# Patient Record
Sex: Male | Born: 1950 | Race: White | Hispanic: No | Marital: Married | Smoking: Never smoker
Health system: Southern US, Community
[De-identification: ages and names within clinical notes are randomized; demographics above are authoritative.]

## PROBLEM LIST (undated history)

## (undated) DIAGNOSIS — R251 Tremor, unspecified: Secondary | ICD-10-CM

## (undated) DIAGNOSIS — D539 Nutritional anemia, unspecified: Secondary | ICD-10-CM

## (undated) DIAGNOSIS — F1010 Alcohol abuse, uncomplicated: Secondary | ICD-10-CM

## (undated) DIAGNOSIS — I509 Heart failure, unspecified: Secondary | ICD-10-CM

## (undated) DIAGNOSIS — K7030 Alcoholic cirrhosis of liver without ascites: Secondary | ICD-10-CM

## (undated) DIAGNOSIS — R4182 Altered mental status, unspecified: Secondary | ICD-10-CM

## (undated) DIAGNOSIS — K7010 Alcoholic hepatitis without ascites: Secondary | ICD-10-CM

## (undated) DIAGNOSIS — D696 Thrombocytopenia, unspecified: Secondary | ICD-10-CM

## (undated) DIAGNOSIS — I10 Essential (primary) hypertension: Secondary | ICD-10-CM

## (undated) DIAGNOSIS — M25551 Pain in right hip: Secondary | ICD-10-CM

## (undated) DIAGNOSIS — M130 Polyarthritis, unspecified: Secondary | ICD-10-CM

## (undated) DIAGNOSIS — K219 Gastro-esophageal reflux disease without esophagitis: Secondary | ICD-10-CM

## (undated) DIAGNOSIS — S02401A Maxillary fracture, unspecified, initial encounter for closed fracture: Secondary | ICD-10-CM

## (undated) DIAGNOSIS — Z8719 Personal history of other diseases of the digestive system: Secondary | ICD-10-CM

## (undated) DIAGNOSIS — K759 Inflammatory liver disease, unspecified: Secondary | ICD-10-CM

## (undated) DIAGNOSIS — R569 Unspecified convulsions: Secondary | ICD-10-CM

## (undated) DIAGNOSIS — R188 Other ascites: Secondary | ICD-10-CM

## (undated) DIAGNOSIS — F1020 Alcohol dependence, uncomplicated: Secondary | ICD-10-CM

## (undated) DIAGNOSIS — G8929 Other chronic pain: Secondary | ICD-10-CM

## (undated) DIAGNOSIS — G312 Degeneration of nervous system due to alcohol: Secondary | ICD-10-CM

## (undated) DIAGNOSIS — E7800 Pure hypercholesterolemia, unspecified: Secondary | ICD-10-CM

## (undated) DIAGNOSIS — M1611 Unilateral primary osteoarthritis, right hip: Secondary | ICD-10-CM

## (undated) HISTORY — DX: Pure hypercholesterolemia, unspecified: E78.00

## (undated) HISTORY — PX: WISDOM TOOTH EXTRACTION: SHX21

## (undated) HISTORY — DX: Inflammatory liver disease, unspecified: K75.9

## (undated) HISTORY — DX: Altered mental status, unspecified: R41.82

## (undated) HISTORY — DX: Degeneration of nervous system due to alcohol: G31.2

## (undated) HISTORY — DX: Unilateral primary osteoarthritis, right hip: M16.11

## (undated) HISTORY — DX: Other chronic pain: G89.29

## (undated) HISTORY — DX: Other ascites: R18.8

## (undated) HISTORY — DX: Thrombocytopenia, unspecified: D69.6

## (undated) HISTORY — DX: Gastro-esophageal reflux disease without esophagitis: K21.9

## (undated) HISTORY — DX: Polyarthritis, unspecified: M13.0

## (undated) HISTORY — DX: Pain in right hip: M25.551

## (undated) HISTORY — DX: Alcohol dependence, uncomplicated: F10.20

## (undated) HISTORY — PX: TONSILLECTOMY: SUR1361

## (undated) HISTORY — DX: Nutritional anemia, unspecified: D53.9

## (undated) HISTORY — DX: Maxillary fracture, unspecified side, initial encounter for closed fracture: S02.401A

## (undated) HISTORY — DX: Alcoholic hepatitis without ascites: K70.10

## (undated) HISTORY — DX: Alcohol abuse, uncomplicated: F10.10

## (undated) HISTORY — DX: Essential (primary) hypertension: I10

## (undated) HISTORY — DX: Other disorders of bilirubin metabolism: E80.6

## (undated) HISTORY — DX: Alcoholic cirrhosis of liver without ascites: K70.30

---

## 2004-03-07 ENCOUNTER — Ambulatory Visit: Payer: Self-pay | Admitting: Internal Medicine

## 2009-01-12 ENCOUNTER — Emergency Department: Payer: Self-pay | Admitting: Emergency Medicine

## 2010-02-06 ENCOUNTER — Emergency Department (HOSPITAL_COMMUNITY)
Admission: EM | Admit: 2010-02-06 | Discharge: 2010-02-06 | Payer: Self-pay | Source: Home / Self Care | Admitting: Emergency Medicine

## 2013-09-23 ENCOUNTER — Emergency Department (HOSPITAL_COMMUNITY): Payer: 59

## 2013-09-23 ENCOUNTER — Emergency Department (HOSPITAL_COMMUNITY)
Admission: EM | Admit: 2013-09-23 | Discharge: 2013-09-23 | Disposition: A | Discharge status: Home / Self Care | Payer: 59 | Attending: Emergency Medicine | Admitting: Emergency Medicine

## 2013-09-23 ENCOUNTER — Encounter (HOSPITAL_COMMUNITY): Payer: Self-pay | Admitting: Emergency Medicine

## 2013-09-23 DIAGNOSIS — R296 Repeated falls: Secondary | ICD-10-CM | POA: Insufficient documentation

## 2013-09-23 DIAGNOSIS — F10929 Alcohol use, unspecified with intoxication, unspecified: Secondary | ICD-10-CM

## 2013-09-23 DIAGNOSIS — S43006A Unspecified dislocation of unspecified shoulder joint, initial encounter: Secondary | ICD-10-CM | POA: Diagnosis not present

## 2013-09-23 DIAGNOSIS — S79919A Unspecified injury of unspecified hip, initial encounter: Secondary | ICD-10-CM | POA: Diagnosis not present

## 2013-09-23 DIAGNOSIS — F1010 Alcohol abuse, uncomplicated: Secondary | ICD-10-CM | POA: Diagnosis not present

## 2013-09-23 DIAGNOSIS — S46909A Unspecified injury of unspecified muscle, fascia and tendon at shoulder and upper arm level, unspecified arm, initial encounter: Secondary | ICD-10-CM | POA: Insufficient documentation

## 2013-09-23 DIAGNOSIS — I10 Essential (primary) hypertension: Secondary | ICD-10-CM | POA: Diagnosis not present

## 2013-09-23 DIAGNOSIS — Z79899 Other long term (current) drug therapy: Secondary | ICD-10-CM | POA: Diagnosis not present

## 2013-09-23 DIAGNOSIS — M25551 Pain in right hip: Secondary | ICD-10-CM

## 2013-09-23 DIAGNOSIS — Y939 Activity, unspecified: Secondary | ICD-10-CM | POA: Diagnosis not present

## 2013-09-23 DIAGNOSIS — S4980XA Other specified injuries of shoulder and upper arm, unspecified arm, initial encounter: Secondary | ICD-10-CM | POA: Diagnosis present

## 2013-09-23 DIAGNOSIS — S79929A Unspecified injury of unspecified thigh, initial encounter: Secondary | ICD-10-CM | POA: Diagnosis not present

## 2013-09-23 DIAGNOSIS — G8929 Other chronic pain: Secondary | ICD-10-CM

## 2013-09-23 DIAGNOSIS — Y929 Unspecified place or not applicable: Secondary | ICD-10-CM | POA: Diagnosis not present

## 2013-09-23 DIAGNOSIS — S43004A Unspecified dislocation of right shoulder joint, initial encounter: Secondary | ICD-10-CM

## 2013-09-23 HISTORY — DX: Essential (primary) hypertension: I10

## 2013-09-23 LAB — COMPREHENSIVE METABOLIC PANEL
ALK PHOS: 85 U/L (ref 39–117)
ALT: 48 U/L (ref 0–53)
ANION GAP: 16 — AB (ref 5–15)
AST: 53 U/L — AB (ref 0–37)
Albumin: 4 g/dL (ref 3.5–5.2)
BILIRUBIN TOTAL: 0.3 mg/dL (ref 0.3–1.2)
BUN: 6 mg/dL (ref 6–23)
CALCIUM: 9.1 mg/dL (ref 8.4–10.5)
CHLORIDE: 109 meq/L (ref 96–112)
CO2: 20 meq/L (ref 19–32)
CREATININE: 0.62 mg/dL (ref 0.50–1.35)
GFR calc Af Amer: >90 mL/min (ref >90)
GFR calc non Af Amer: >90 mL/min (ref >90)
GLUCOSE: 119 mg/dL — AB (ref 70–99)
POTASSIUM: 4.1 meq/L (ref 3.7–5.3)
Sodium: 145 mEq/L (ref 137–147)
TOTAL PROTEIN: 7.3 g/dL (ref 6.0–8.3)

## 2013-09-23 LAB — CBC WITH DIFFERENTIAL/PLATELET
BASOS PCT: 0 % (ref 0–1)
Basophils Absolute: 0 10*3/uL (ref 0.0–0.1)
EOS PCT: 0 % (ref 0–5)
Eosinophils Absolute: 0 10*3/uL (ref 0.0–0.7)
HEMATOCRIT: 49.7 % (ref 39.0–52.0)
HEMOGLOBIN: 16.8 g/dL (ref 13.0–17.0)
LYMPHS PCT: 9 % — AB (ref 12–46)
Lymphs Abs: 0.8 10*3/uL (ref 0.7–4.0)
MCH: 34.9 pg — ABNORMAL HIGH (ref 26.0–34.0)
MCHC: 33.8 g/dL (ref 30.0–36.0)
MCV: 103.3 fL — AB (ref 78.0–100.0)
Monocytes Absolute: 0.3 10*3/uL (ref 0.1–1.0)
Monocytes Relative: 3 % (ref 3–12)
NEUTROS ABS: 8.1 10*3/uL — AB (ref 1.7–7.7)
Neutrophils Relative %: 88 % — ABNORMAL HIGH (ref 43–77)
PLATELETS: 177 10*3/uL (ref 150–400)
RBC: 4.81 MIL/uL (ref 4.22–5.81)
RDW: 13.3 % (ref 11.5–15.5)
WBC: 9.2 10*3/uL (ref 4.0–10.5)

## 2013-09-23 LAB — ETHANOL: ALCOHOL ETHYL (B): 203 mg/dL — AB (ref 0–11)

## 2013-09-23 MED ORDER — ETOMIDATE 2 MG/ML IV SOLN
INTRAVENOUS | Status: AC | PRN
  Administered 2013-09-23: 10 mg via INTRAVENOUS

## 2013-09-23 MED ORDER — PROPOFOL 10 MG/ML IV BOLUS
200.0000 mg | Freq: Once | INTRAVENOUS | Status: DC
  Filled 2013-09-23: qty 1

## 2013-09-23 MED ORDER — PROPOFOL 10 MG/ML IV BOLUS
INTRAVENOUS | Status: AC | PRN
  Administered 2013-09-23 (×2): 100 mg via INTRAVENOUS

## 2013-09-23 MED ORDER — KETAMINE HCL 10 MG/ML IJ SOLN: 200.0000 mg | Freq: Once | INTRAMUSCULAR | Status: DC

## 2013-09-23 MED ORDER — TRAMADOL HCL 50 MG PO TABS: 50.0000 mg | ORAL_TABLET | Freq: Two times a day (BID) | ORAL | Status: DC | PRN

## 2013-09-23 MED ORDER — ETOMIDATE 2 MG/ML IV SOLN
10.0000 mg | Freq: Once | INTRAVENOUS | Status: DC
  Filled 2013-09-23: qty 10

## 2013-09-23 MED ORDER — MORPHINE SULFATE 4 MG/ML IJ SOLN
4.0000 mg | Freq: Once | INTRAMUSCULAR | Status: AC
  Administered 2013-09-23: 4 mg via INTRAVENOUS
  Filled 2013-09-23: qty 1

## 2013-09-23 MED ORDER — HYDROMORPHONE HCL PF 1 MG/ML IJ SOLN
1.0000 mg | Freq: Once | INTRAMUSCULAR | Status: DC
Start: 2013-09-23 — End: 2013-09-23
  Filled 2013-09-23: qty 1

## 2013-09-23 MED ORDER — MORPHINE SULFATE 2 MG/ML IJ SOLN
2.0000 mg | Freq: Once | INTRAMUSCULAR | Status: AC
  Administered 2013-09-23: 2 mg via INTRAVENOUS
  Filled 2013-09-23: qty 1

## 2013-09-23 NOTE — ED Notes (Signed)
First attempt at shoulder reduction unsuccessful. Plan: allow etomidate to wear off, attempt reduction again with propofol when appropriate.

## 2013-09-23 NOTE — Progress Notes (Signed)
[facility] spoke with patient about community resources. Patient stated that he had [employer] but did not have card with him.

## 2013-09-23 NOTE — ED Notes (Signed)
Bed: GX21 Expected date:  Expected time:  Means of arrival:  Comments: Shoulder dislocation

## 2013-09-23 NOTE — ED Notes (Signed)
Per EMS pt fell in his bedroom after consuming some unknown amt of alcohol; deformity to right shoulder noted

## 2013-09-23 NOTE — Discharge Instructions (Signed)
Shoulder Dislocation Your shoulder is made up of three bones: the collar bone (clavicle); the shoulder blade (scapula), which includes the socket (glenoid cavity); and the upper arm bone (humerus). Your shoulder joint is the place where these bones meet. Strong, fibrous tissues hold these bones together (ligaments). Muscles and strong, fibrous tissues that connect the muscles to these bones (tendons) allow your arm to move through this joint. The range of motion of your shoulder joint is more extensive than most of your other joints, and the glenoid cavity is very shallow. That is the reason that your shoulder joint is one of the most unstable joints in your body. It is far more prone to dislocation than your other joints. Shoulder dislocation is when your humerus is forced out of your shoulder joint. CAUSES Shoulder dislocation is caused by a forceful impact on your shoulder. This impact usually is from an injury, such as a sports injury or a fall. SYMPTOMS Symptoms of shoulder dislocation include:  Deformity of your shoulder.  Intense pain.  Inability to move your shoulder joint.  Numbness, weakness, or tingling around your shoulder joint (your neck or down your arm).  Bruising or swelling around your shoulder. DIAGNOSIS In order to diagnose a dislocated shoulder, your caregiver will perform a physical exam. Your caregiver also may have an X-ray exam done to see if you have any broken bones. Magnetic resonance imaging (MRI) is a procedure that sometimes is done to help your caregiver see any damage to the soft tissues around your shoulder, particularly your rotator cuff tendons. Additionally, your caregiver also may have electromyography done to measure the electrical discharges produced in your muscles if you have signs or symptoms of nerve damage. TREATMENT A shoulder dislocation is treated by placing the humerus back in the joint (reduction). Your caregiver does this either manually (closed  reduction), by moving your humerus back into the joint through manipulation, or through surgery (open reduction). When your humerus is back in place, severe pain should improve almost immediately. You also may need to have surgery if you have a weak shoulder joint or ligaments, and you have recurring shoulder dislocations, despite rehabilitation. In rare cases, surgery is necessary if your nerves or blood vessels are damaged during the dislocation. After your reduction, your arm will be placed in a shoulder immobilizer or sling to keep it from moving. Your caregiver will have you wear your shoulder immobilizer or sling for 3 days to 3 weeks, depending on how serious your dislocation is. When your shoulder immobilizer or sling is removed, your caregiver may prescribe physical therapy to help improve the range of motion in your shoulder joint. HOME CARE INSTRUCTIONS  The following measures can help to reduce pain and speed up the healing process:  Rest your injured joint. Do not move it. Avoid activities similar to the one that caused your injury.  Apply ice to your injured joint for the first day or two after your reduction or as directed by your caregiver. Applying ice helps to reduce inflammation and pain.  Put ice in a plastic bag.  Place a towel between your skin and the bag.  Leave the ice on for 15-20 minutes at a time, every 2 hours while you are awake.  Exercise your hand by squeezing a soft ball. This helps to eliminate stiffness and swelling in your hand and wrist.  Take over-the-counter or prescription medicine for pain or discomfort as told by your caregiver. SEEK IMMEDIATE MEDICAL CARE IF:   Your  shoulder immobilizer or sling becomes damaged.  Your pain becomes worse rather than better.  You lose feeling in your arm or hand, or they become white and cold. MAKE SURE YOU:   Understand these instructions.  Will watch your condition.  Will get help right away if you are not  doing well or get worse. Document Released: 11/12/2000 Document Revised: 07/04/2013 Document Reviewed: 12/08/2010 [facility] Patient Information 2015 [place], [place]. This information is not intended to replace advice given to you by your health care provider. Make sure you discuss any questions you have with your health care provider.   Alcohol Intoxication Alcohol intoxication occurs when the amount of alcohol that a person has consumed impairs his or her ability to mentally and physically function. Alcohol directly impairs the normal chemical activity of the brain. Drinking large amounts of alcohol can lead to changes in mental function and behavior, and it can cause many physical effects that can be harmful.  Alcohol intoxication can range in severity from mild to very severe. Various factors can affect the level of intoxication that occurs, such as the person's age, gender, weight, frequency of alcohol consumption, and the presence of other medical conditions (such as diabetes, seizures, or heart conditions). Dangerous levels of alcohol intoxication may occur when people drink large amounts of alcohol in a short period (binge drinking). Alcohol can also be especially dangerous when combined with certain prescription medicines or "recreational" drugs. SIGNS AND SYMPTOMS Some common signs and symptoms of mild alcohol intoxication include:  Loss of coordination.  Changes in mood and behavior.  Impaired judgment.  Slurred speech. As alcohol intoxication progresses to more severe levels, other signs and symptoms will appear. These may include:  Vomiting.  Confusion and impaired memory.  Slowed breathing.  Seizures.  Loss of consciousness. DIAGNOSIS  Your health care provider will take a medical history and perform a physical exam. You will be asked about the amount and type of alcohol you have consumed. Blood tests will be done to measure the concentration of alcohol in your blood. In  many places, your blood alcohol level must be lower than 80 mg/dL (6.33%) to legally drive. However, many dangerous effects of alcohol can occur at much lower levels.  TREATMENT  People with alcohol intoxication often do not require treatment. Most of the effects of alcohol intoxication are temporary, and they go away as the alcohol naturally leaves the body. Your health care provider will monitor your condition until you are stable enough to go home. Fluids are sometimes given through an IV access tube to help prevent dehydration.  HOME CARE INSTRUCTIONS  Do not drive after drinking alcohol.  Stay hydrated. Drink enough water and fluids to keep your urine clear or pale yellow. Avoid caffeine.   Only take over-the-counter or prescription medicines as directed by your health care provider.  SEEK MEDICAL CARE IF:   You have persistent vomiting.   You do not feel better after a few days.  You have frequent alcohol intoxication. Your health care provider can help determine if you should see a substance use treatment counselor. SEEK IMMEDIATE MEDICAL CARE IF:   You become shaky or tremble when you try to stop drinking.   You shake uncontrollably (seizure).   You throw up (vomit) blood. This may be bright red or may look like black coffee grounds.   You have blood in your stool. This may be bright red or may appear as a black, tarry, bad smelling stool.   You  become lightheaded or faint.  MAKE SURE YOU:   Understand these instructions.  Will watch your condition.  Will get help right away if you are not doing well or get worse. Document Released: 11/27/2004 Document Revised: 10/20/2012 Document Reviewed: 07/23/2012 [facility]ExitCare Patient Information 2015 [place]. This information is not intended to replace advice given to you by your health care provider. Make sure you discuss any questions you have with your health care provider.

## 2013-09-23 NOTE — ED Provider Notes (Signed)
CSN: [id number]     Arrival date & time 09/23/13  1151 History   First MD Initiated Contact with Patient 09/23/13 1205     Chief Complaint  Patient presents with  . Shoulder Pain     (Consider location/radiation/quality/duration/timing/severity/associated sxs/prior Treatment) HPI Comments: Per EMS pt fell in his bedroom after consuming some unknown amt of alcohol last night.   Patient is a 63 y.o. male presenting with shoulder injury. The history is provided by the patient and the EMS personnel. No language interpreter was used.  Shoulder Injury This is a new problem. Episode onset: unsure. The problem occurs constantly. The problem has not changed since onset.Pertinent negatives include no chest pain, no abdominal pain, no headaches and no shortness of breath. Associated symptoms comments: R hip pain . Exacerbated by: moving. Relieved by: immobilization. Treatments tried: immobilization. The treatment provided moderate relief.    Past Medical History  Diagnosis Date  . Hypertension    History reviewed. No pertinent past surgical history. Family History  Problem Relation Age of Onset  . Heart failure Father    History  Substance Use Topics  . Smoking status: Never Smoker   . Smokeless tobacco: Never Used  . Alcohol Use: 4.8 oz/week    7 Cans of beer, 1 Shots of liquor per week    Review of Systems  Constitutional: Negative for fever, activity change, appetite change and fatigue.  HENT: Negative for congestion, facial swelling, rhinorrhea and trouble swallowing.   Eyes: Negative for photophobia and pain.  Respiratory: Negative for cough, chest tightness and shortness of breath.   Cardiovascular: Negative for chest pain and leg swelling.  Gastrointestinal: Negative for nausea, vomiting, abdominal pain, diarrhea and constipation.  Endocrine: Negative for polydipsia and polyuria.  Genitourinary: Negative for dysuria, urgency, decreased urine volume and difficulty urinating.   Musculoskeletal: Negative for back pain and gait problem.  Skin: Negative for color change, rash and wound.  Allergic/Immunologic: Negative for immunocompromised state.  Neurological: Negative for dizziness, facial asymmetry, speech difficulty, weakness, numbness and headaches.  Psychiatric/Behavioral: Negative for confusion, decreased concentration and agitation.      Allergies  Review of patient's allergies indicates no known allergies.  Home Medications   Prior to Admission medications   Medication Sig Start Date End Date Taking? Authorizing Provider  Multiple Vitamin (MULTIVITAMIN WITH MINERALS) TABS tablet Take 1 tablet by mouth daily.   Yes Historical Provider, MD  Omega-3 Fatty Acids (FISH OIL PO) Take 1 capsule by mouth daily.   Yes Historical Provider, MD  VITAMIN D, ERGOCALCIFEROL, PO Take 1 capsule by mouth daily.   Yes Historical Provider, MD  traMADol (ULTRAM) 50 MG tablet Take 1 tablet (50 mg total) by mouth every 12 (twelve) hours as needed. 09/23/13   Shanna CiscoMegan E Montee Tallman, MD   BP 170/90  Pulse 105  Temp(Src) 98.6 F (37 C) (Oral)  Resp 18  Ht 5\' 11"  (1.803 m)  Wt 210 lb (95.255 kg)  BMI 29.30 kg/m2  SpO2 97% Physical Exam  Constitutional: He is oriented to person, place, and time. He appears well-developed and well-nourished. No distress.  HENT:  Head: Normocephalic and atraumatic.  Mouth/Throat: No oropharyngeal exudate.  Eyes: Pupils are equal, round, and reactive to light.  Neck: Normal range of motion. Neck supple.  Cardiovascular: Normal rate, regular rhythm and normal heart sounds.  Exam reveals no gallop and no friction rub.   No murmur heard. Pulmonary/Chest: Effort normal and breath sounds normal. No respiratory distress. He has no wheezes.  He has no rales.  Abdominal: Soft. Bowel sounds are normal. He exhibits no distension and no mass. There is no tenderness. There is no rebound and no guarding.  Musculoskeletal: Normal range of motion. He exhibits no  edema and no tenderness.  Neurological: He is alert and oriented to person, place, and time.  Skin: Skin is warm and dry.  Psychiatric: He has a normal mood and affect.    ED Course  Procedural sedation Date/Time: 09/23/2013 3:54 PM Performed by: Toy Cookey, E Authorized by: Toy Cookey, E Consent: Verbal consent obtained. written consent obtained. Risks and benefits: risks, benefits and alternatives were discussed Consent given by: patient Patient understanding: patient states understanding of the procedure being performed Patient consent: the patient's understanding of the procedure matches consent given Procedure consent: procedure consent matches procedure scheduled Relevant documents: relevant documents present and verified Test results: test results available and properly labeled Site marked: the operative site was marked Imaging studies: imaging studies available Required items: required blood products, implants, devices, and special equipment available Patient identity confirmed: verbally with patient Time out: Immediately prior to procedure a "time out" was called to verify the correct patient, procedure, equipment, support staff and site/side marked as required. Patient sedated: yes Sedation type: moderate (conscious) sedation Sedatives: propofol Sedation start date/time: 09/23/2013 3:54 PM Sedation end date/time: 09/23/2013 4:15 PM Vitals: Vital signs were monitored during sedation. Patient tolerance: Patient tolerated the procedure well with no immediate complications. Comments: R shoulder reduction successful  Procedural sedation Date/Time: 09/23/2013 3:26 PM Performed by: Toy Cookey, E Authorized by: Toy Cookey, E Consent: Verbal consent obtained. written consent obtained. Risks and benefits: risks, benefits and alternatives were discussed Consent given by: patient Patient understanding: patient states understanding of the procedure being  performed Patient consent: the patient's understanding of the procedure matches consent given Procedure consent: procedure consent matches procedure scheduled Relevant documents: relevant documents present and verified Test results: test results available and properly labeled Site marked: the operative site was marked Imaging studies: imaging studies available Required items: required blood products, implants, devices, and special equipment available Patient identity confirmed: verbally with patient and arm band Time out: Immediately prior to procedure a "time out" was called to verify the correct patient, procedure, equipment, support staff and site/side marked as required. Patient sedated: yes Sedation type: moderate (conscious) sedation Sedatives: etomidate Sedation start date/time: 09/23/2013 3:26 PM Sedation end date/time: 09/23/2013 3:40 PM Vitals: Vital signs were monitored during sedation. Patient tolerance: Patient tolerated the procedure well with no immediate complications. Comments: R shoulder unable to be reduced  Reduction of dislocation Date/Time: 09/23/2013 3:54 PM Performed by: Toy Cookey, E Authorized by: Toy Cookey, E Consent: Verbal consent obtained. written consent obtained. Risks and benefits: risks, benefits and alternatives were discussed Consent given by: patient Patient understanding: patient states understanding of the procedure being performed Patient consent: the patient's understanding of the procedure matches consent given Procedure consent: procedure consent matches procedure scheduled Relevant documents: relevant documents present and verified Test results: test results available and properly labeled Site marked: the operative site was marked Imaging studies: imaging studies available Required items: required blood products, implants, devices, and special equipment available Patient identity confirmed: verbally with patient and arm band Time  out: Immediately prior to procedure a "time out" was called to verify the correct patient, procedure, equipment, support staff and site/side marked as required. Patient sedated: yes (see sedation documentation) Patient tolerance: Patient tolerated the procedure well with no immediate complications. Comments: Shoulder not reduced  with first attempt, but was successfully reduced with second attempt     (including critical care time) Labs Review Labs Reviewed  ETHANOL - Abnormal; Notable for the following:    Alcohol, Ethyl (B) 203 (*)    All other components within normal limits  CBC WITH DIFFERENTIAL - Abnormal; Notable for the following:    MCV 103.3 (*)    MCH 34.9 (*)    Neutrophils Relative % 88 (*)    Neutro Abs 8.1 (*)    Lymphocytes Relative 9 (*)    All other components within normal limits  COMPREHENSIVE METABOLIC PANEL - Abnormal; Notable for the following:    Glucose, Bld 119 (*)    AST 53 (*)    Anion gap 16 (*)    All other components within normal limits    Imaging Review Dg Chest 2 View  09/23/2013   CLINICAL DATA:  Right shoulder pain post fall  EXAM: CHEST  2 VIEW  COMPARISON:  None.  FINDINGS: Cardiomediastinal silhouette is unremarkable. Old right rib fractures. No acute infiltrate or pleural effusion. No pulmonary edema. There is anterior right shoulder dislocation.  IMPRESSION: No active cardiopulmonary disease. Old right rib fracture. Anterior right shoulder dislocation.   Electronically Signed   By: Natasha Mead M.D.   On: 09/23/2013 13:56   Dg Shoulder Right  09/23/2013   CLINICAL DATA:  Right shoulder pain, fall  EXAM: RIGHT SHOULDER - 2+ VIEW  COMPARISON:  None.  FINDINGS: The humeral head is inferior and medial to the glenoid fossa consistent exercise scar dislocation. No evidence of fracture.  IMPRESSION: Anterior shoulder dislocation.  No evidence of fracture   Electronically Signed   By: Genevive Bi M.D.   On: 09/23/2013 14:56   Dg Hip Complete  Right  09/23/2013   CLINICAL DATA:  Patient found down.  Right hip pain.  EXAM: RIGHT HIP - COMPLETE 2+ VIEW  COMPARISON:  None.  FINDINGS: There is no acute bony or joint abnormality. The patient has bilateral hip degenerative change, much worse on the right where there is bone-on-bone joint space narrowing. No focal bony lesion is identified. Surrounding soft tissue structures appear normal.  IMPRESSION: No acute finding.  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   Electronically Signed   By: Drusilla Kanner M.D.   On: 09/23/2013 14:00   Dg Shoulder Right Port  09/23/2013   CLINICAL DATA:  Status post shoulder reduction.  EXAM: PORTABLE RIGHT SHOULDER - 2+ VIEW  COMPARISON:  09/23/2013.  FINDINGS: Acromioclavicular and glenohumeral degenerative change. No evidence of fracture dislocation. Previously identified anterior shoulder dislocation reduced. Multiple right rib fractures.  IMPRESSION: Interim reduction of right shoulder dislocation.   Electronically Signed   By: Maisie Fus  Register   On: 09/23/2013 16:34     EKG Interpretation None      MDM   Final diagnoses:  Shoulder dislocation, right, initial encounter  Alcohol intoxication, with unspecified complication  Chronic right hip pain    Pt is a 63 y.o. male with Pmhx as above who presents with R shoulder and R hip pain. Pt states he had slight shoulder pain, last night, was worse this morning. R hip pain he says is chronic, but worse today. He reports having one beer 2 night ago. Wife reports he has been drinking beer all night long. EMS reported he had fallen in the bedroom.  Suspect R shoulder dislocation vs humerus fx on PE. Pt also mildly hypoxic initally, but denies CP, SOB,  cough. Will get CXR and XR R hip as well.   CXR nml except for R anterior shoulder dislocation. XR hip with OA  6:55 PM Have attempted manipulation without sedation unsuccessfully. Will do procedural sedation for closed reduction.   Second reduction w/  propofol appears to have been successful.  Care transferred to Dr. Gwendolyn Grant who will f/u with post-reduction films. Plan for d/c home w/ shoulder immobilizer and outpt ortho f/u in 1 week.       Shanna Cisco, MD 09/23/13 [phone]

## 2013-09-23 NOTE — ED Provider Notes (Signed)
1630 - patient care from Dr. Micheline Maze.  Patient is alert and oriented, doing well post-sedation. Ambulatory. Stable for discharge.  1. Shoulder dislocation, right, initial encounter   2. Alcohol intoxication, with unspecified complication   3. Chronic right hip pain      Gary Hait, MD 09/23/13 1659

## 2014-02-25 ENCOUNTER — Encounter (HOSPITAL_COMMUNITY): Payer: Self-pay | Admitting: Emergency Medicine

## 2014-02-25 ENCOUNTER — Inpatient Hospital Stay (HOSPITAL_COMMUNITY): Payer: No Typology Code available for payment source

## 2014-02-25 ENCOUNTER — Inpatient Hospital Stay (HOSPITAL_COMMUNITY)
Admission: EM | Admit: 2014-02-25 | Discharge: 2014-03-04 | DRG: 432 | Disposition: A | Discharge status: Skilled Nursing Facility | Payer: No Typology Code available for payment source | Attending: Internal Medicine | Admitting: Internal Medicine

## 2014-02-25 DIAGNOSIS — D6959 Other secondary thrombocytopenia: Secondary | ICD-10-CM | POA: Diagnosis present

## 2014-02-25 DIAGNOSIS — F1020 Alcohol dependence, uncomplicated: Secondary | ICD-10-CM | POA: Diagnosis present

## 2014-02-25 DIAGNOSIS — Z8249 Family history of ischemic heart disease and other diseases of the circulatory system: Secondary | ICD-10-CM

## 2014-02-25 DIAGNOSIS — G252 Other specified forms of tremor: Secondary | ICD-10-CM | POA: Diagnosis present

## 2014-02-25 DIAGNOSIS — K759 Inflammatory liver disease, unspecified: Secondary | ICD-10-CM

## 2014-02-25 DIAGNOSIS — W19XXXA Unspecified fall, initial encounter: Secondary | ICD-10-CM | POA: Diagnosis present

## 2014-02-25 DIAGNOSIS — D696 Thrombocytopenia, unspecified: Secondary | ICD-10-CM | POA: Diagnosis not present

## 2014-02-25 DIAGNOSIS — D649 Anemia, unspecified: Secondary | ICD-10-CM | POA: Diagnosis not present

## 2014-02-25 DIAGNOSIS — K7290 Hepatic failure, unspecified without coma: Secondary | ICD-10-CM | POA: Diagnosis present

## 2014-02-25 DIAGNOSIS — I10 Essential (primary) hypertension: Secondary | ICD-10-CM | POA: Diagnosis present

## 2014-02-25 DIAGNOSIS — K7200 Acute and subacute hepatic failure without coma: Secondary | ICD-10-CM | POA: Diagnosis not present

## 2014-02-25 DIAGNOSIS — B179 Acute viral hepatitis, unspecified: Secondary | ICD-10-CM | POA: Diagnosis present

## 2014-02-25 DIAGNOSIS — G9340 Encephalopathy, unspecified: Secondary | ICD-10-CM | POA: Diagnosis present

## 2014-02-25 DIAGNOSIS — E876 Hypokalemia: Secondary | ICD-10-CM | POA: Diagnosis present

## 2014-02-25 DIAGNOSIS — F1010 Alcohol abuse, uncomplicated: Secondary | ICD-10-CM | POA: Diagnosis not present

## 2014-02-25 DIAGNOSIS — F10239 Alcohol dependence with withdrawal, unspecified: Secondary | ICD-10-CM | POA: Diagnosis present

## 2014-02-25 DIAGNOSIS — R509 Fever, unspecified: Secondary | ICD-10-CM

## 2014-02-25 DIAGNOSIS — E871 Hypo-osmolality and hyponatremia: Secondary | ICD-10-CM | POA: Insufficient documentation

## 2014-02-25 DIAGNOSIS — E8770 Fluid overload, unspecified: Secondary | ICD-10-CM | POA: Diagnosis present

## 2014-02-25 DIAGNOSIS — R17 Unspecified jaundice: Secondary | ICD-10-CM

## 2014-02-25 DIAGNOSIS — R0600 Dyspnea, unspecified: Secondary | ICD-10-CM

## 2014-02-25 DIAGNOSIS — K7010 Alcoholic hepatitis without ascites: Principal | ICD-10-CM | POA: Diagnosis present

## 2014-02-25 DIAGNOSIS — D539 Nutritional anemia, unspecified: Secondary | ICD-10-CM | POA: Diagnosis present

## 2014-02-25 HISTORY — DX: Alcoholic hepatitis without ascites: K70.10

## 2014-02-25 LAB — COMPREHENSIVE METABOLIC PANEL
ALT: 120 U/L — ABNORMAL HIGH (ref 0–53)
AST: 360 U/L — ABNORMAL HIGH (ref 0–37)
Albumin: 2.6 g/dL — ABNORMAL LOW (ref 3.5–5.2)
Alkaline Phosphatase: 437 U/L — ABNORMAL HIGH (ref 39–117)
Anion gap: 14 (ref 5–15)
BUN: 9 mg/dL (ref 6–23)
CO2: 31 mmol/L (ref 19–32)
Calcium: 8.3 mg/dL — ABNORMAL LOW (ref 8.4–10.5)
Chloride: 86 mEq/L — ABNORMAL LOW (ref 96–112)
Creatinine, Ser: <0.3 mg/dL — ABNORMAL LOW (ref 0.50–1.35)
Glucose, Bld: 95 mg/dL (ref 70–99)
Potassium: 2 mmol/L — CL (ref 3.5–5.1)
Sodium: 131 mmol/L — ABNORMAL LOW (ref 135–145)
Total Bilirubin: 26.3 mg/dL (ref 0.3–1.2)
Total Protein: 7.2 g/dL (ref 6.0–8.3)

## 2014-02-25 LAB — CBC WITH DIFFERENTIAL/PLATELET
Basophils Absolute: 0.1 10*3/uL (ref 0.0–0.1)
Basophils Relative: 1 % (ref 0–1)
Eosinophils Absolute: 0.1 10*3/uL (ref 0.0–0.7)
Eosinophils Relative: 1 % (ref 0–5)
HCT: 42.7 % (ref 39.0–52.0)
Hemoglobin: 14.7 g/dL (ref 13.0–17.0)
Lymphocytes Relative: 10 % — ABNORMAL LOW (ref 12–46)
Lymphs Abs: 1 10*3/uL (ref 0.7–4.0)
MCH: 36.3 pg — ABNORMAL HIGH (ref 26.0–34.0)
MCHC: 34.4 g/dL (ref 30.0–36.0)
MCV: 105.4 fL — ABNORMAL HIGH (ref 78.0–100.0)
Monocytes Absolute: 0.5 10*3/uL (ref 0.1–1.0)
Monocytes Relative: 5 % (ref 3–12)
Neutro Abs: 7.7 10*3/uL (ref 1.7–7.7)
Neutrophils Relative %: 82 % — ABNORMAL HIGH (ref 43–77)
Platelets: 110 10*3/uL — ABNORMAL LOW (ref 150–400)
RBC: 4.05 MIL/uL — ABNORMAL LOW (ref 4.22–5.81)
RDW: 15.5 % (ref 11.5–15.5)
WBC: 9.3 10*3/uL (ref 4.0–10.5)

## 2014-02-25 LAB — PROTIME-INR
INR: 1.28 (ref 0.00–1.49)
INR: 1.27 (ref 0.00–1.49)
Prothrombin Time: 16.1 seconds — ABNORMAL HIGH (ref 11.6–15.2)
Prothrombin Time: 16 seconds — ABNORMAL HIGH (ref 11.6–15.2)

## 2014-02-25 LAB — CBC
HEMATOCRIT: 37.6 % — AB (ref 39.0–52.0)
HEMOGLOBIN: 12.8 g/dL — AB (ref 13.0–17.0)
MCH: 35.8 pg — ABNORMAL HIGH (ref 26.0–34.0)
MCHC: 34 g/dL (ref 30.0–36.0)
MCV: 105 fL — ABNORMAL HIGH (ref 78.0–100.0)
Platelets: 119 10*3/uL — ABNORMAL LOW (ref 150–400)
RBC: 3.58 MIL/uL — AB (ref 4.22–5.81)
RDW: 15.9 % — ABNORMAL HIGH (ref 11.5–15.5)
WBC: 10.1 10*3/uL (ref 4.0–10.5)

## 2014-02-25 LAB — AMMONIA: Ammonia: 98 umol/L — ABNORMAL HIGH (ref 11–32)

## 2014-02-25 LAB — ETHANOL: Alcohol, Ethyl (B): 392 mg/dL — ABNORMAL HIGH (ref 0–9)

## 2014-02-25 MED ORDER — PANTOPRAZOLE SODIUM 40 MG IV SOLR
40.0000 mg | INTRAVENOUS | Status: DC
  Administered 2014-02-26 – 2014-02-28 (×3): 40 mg via INTRAVENOUS
  Filled 2014-02-25 (×4): qty 40

## 2014-02-25 MED ORDER — POTASSIUM CHLORIDE 10 MEQ/100ML IV SOLN
10.0000 meq | INTRAVENOUS | Status: AC
  Administered 2014-02-25 – 2014-02-26 (×5): 10 meq via INTRAVENOUS
  Filled 2014-02-25 (×6): qty 100

## 2014-02-25 MED ORDER — ADULT MULTIVITAMIN W/MINERALS CH
1.0000 | ORAL_TABLET | Freq: Every day | ORAL | Status: DC
  Administered 2014-02-25 – 2014-03-02 (×6): 1 via ORAL
  Filled 2014-02-25 (×6): qty 1

## 2014-02-25 MED ORDER — SODIUM CHLORIDE 0.9 % IJ SOLN
3.0000 mL | Freq: Two times a day (BID) | INTRAMUSCULAR | Status: DC
  Administered 2014-02-25 – 2014-03-04 (×7): 3 mL via INTRAVENOUS

## 2014-02-25 MED ORDER — DIAZEPAM 5 MG/ML IJ SOLN
10.0000 mg | Freq: Once | INTRAMUSCULAR | Status: AC
  Administered 2014-02-25: 10 mg via INTRAVENOUS
  Filled 2014-02-25: qty 2

## 2014-02-25 MED ORDER — POTASSIUM CHLORIDE 10 MEQ/100ML IV SOLN
10.0000 meq | INTRAVENOUS | Status: AC
  Administered 2014-02-25 (×3): 10 meq via INTRAVENOUS
  Filled 2014-02-25 (×3): qty 100

## 2014-02-25 MED ORDER — LORAZEPAM 2 MG/ML IJ SOLN
1.0000 mg | Freq: Four times a day (QID) | INTRAMUSCULAR | Status: AC | PRN
  Administered 2014-02-26: 1 mg via INTRAVENOUS
  Filled 2014-02-25 (×2): qty 1

## 2014-02-25 MED ORDER — ONDANSETRON HCL 4 MG PO TABS: 4.0000 mg | ORAL_TABLET | Freq: Four times a day (QID) | ORAL | Status: DC | PRN

## 2014-02-25 MED ORDER — LACTULOSE 10 GM/15ML PO SOLN
20.0000 g | Freq: Once | ORAL | Status: AC
  Administered 2014-02-25: 20 g via ORAL
  Filled 2014-02-25: qty 30

## 2014-02-25 MED ORDER — FOLIC ACID 5 MG/ML IJ SOLN
1.0000 mg | Freq: Every day | INTRAMUSCULAR | Status: DC
  Administered 2014-02-25 – 2014-03-01 (×5): 1 mg via INTRAVENOUS
  Filled 2014-02-25 (×6): qty 0.2

## 2014-02-25 MED ORDER — LORAZEPAM 1 MG PO TABS: 1.0000 mg | ORAL_TABLET | Freq: Four times a day (QID) | ORAL | Status: AC | PRN

## 2014-02-25 MED ORDER — MORPHINE SULFATE 2 MG/ML IJ SOLN
1.0000 mg | INTRAMUSCULAR | Status: DC | PRN
  Administered 2014-02-26 (×3): 1 mg via INTRAVENOUS
  Filled 2014-02-25 (×3): qty 1

## 2014-02-25 MED ORDER — POTASSIUM CHLORIDE CRYS ER 20 MEQ PO TBCR
60.0000 meq | EXTENDED_RELEASE_TABLET | Freq: Once | ORAL | Status: AC
  Administered 2014-02-25: 60 meq via ORAL
  Filled 2014-02-25: qty 3

## 2014-02-25 MED ORDER — LACTULOSE 10 GM/15ML PO SOLN
20.0000 g | Freq: Two times a day (BID) | ORAL | Status: DC
  Administered 2014-02-25 – 2014-02-27 (×4): 20 g via ORAL
  Filled 2014-02-25 (×5): qty 30

## 2014-02-25 MED ORDER — SODIUM CHLORIDE 0.9 % IV SOLN
INTRAVENOUS | Status: DC
  Administered 2014-02-25 – 2014-03-02 (×8): via INTRAVENOUS

## 2014-02-25 MED ORDER — ONDANSETRON HCL 4 MG/2ML IJ SOLN: 4.0000 mg | Freq: Four times a day (QID) | INTRAMUSCULAR | Status: DC | PRN

## 2014-02-25 MED ORDER — THIAMINE HCL 100 MG/ML IJ SOLN
100.0000 mg | Freq: Every day | INTRAMUSCULAR | Status: DC
  Administered 2014-02-25 – 2014-03-01 (×5): 100 mg via INTRAVENOUS
  Filled 2014-02-25 (×5): qty 1

## 2014-02-25 NOTE — H&P (Addendum)
[facility] History and Physical  Gary [name] XBD:[id number] DOB: [birth year] DOA: 02/25/2014  Referring physician: ED physician PCP: No primary care provider on file.   Chief Complaint: altered mental status with yellow skin   HPI:  Pt is 63 yo male with history of alcohol abuse, presented to [facility] ED via EMS after neighbor noted pt appeared intoxicated and with yellow skin. Please note that pt exhibits confusion and is not reliable historian. Some information obtained from friend at bedside who explains pt drinks 10 -12 bottles of beer daily but not sure if he drinks anything else. Friend is also not sure if pt takes any other substances. He thinks pt may have vomited several times as well and he explains that the house is unkempt, with bed smell from alcohol and vomiting. In ED, pt appears intoxicated. VS notable for oxygen sat 87% on RA. Blood work notable for K 2, Na 131, severe transaminitis with bili > 25, PE significant for jaundice as well. [facility] asked to admit for management of acute alcoholic hepatitis. Abd [place] requested in ED and pending.   Assessment and Plan: Active Problems: Acute alcoholic hepatitis - admit to tele bed - repeat CMET now and again in AM, check lipase level  - follow up on abd [place] - keep on CIWA protocol  - MELD score 22 (10% 3 month mortality) Acute encephalopathy - secondary to acute alcohol intoxication, hepatic encephalopathy  - ammonia level elevated, repeat in AM - place on lactulose  - CIWA as noted above - check UDS  Hypokalemia - from electrolyte depletion in the setting of alcohol abuse and ? Vomiting  - supplement, check Mg level and supplement as well if needed  - repeat BMP in AM and also check Mg level in AM Hyponatremia - secondary to pre renal etiology - provide IVF and repeat BMP in AM Abd pain with ? N/V - from alcohol intoxication - abd [place] pending, check lipase  - place on Protonix BID IV Thrombocytopenia - from alcohol induced  bone marrow damage - no signs of active bleeding  - repeat CBC in AM  SCD's for DVT prophylaxis   Radiological Exams on Admission: No results found.   Code Status: Full Family Communication: No family at bedside, friend at bedside  Disposition Plan: Admit for further evaluation     Review of Systems:  Unable to obtain due to altered mental status     Past Medical History  Diagnosis Date  . Hypertension     History reviewed. No pertinent past surgical history.  Social History:  reports that he has never smoked. He has never used smokeless tobacco. He reports that he drinks about 4.8 oz of alcohol per week. He reports that he does not use illicit drugs.  No Known Allergies  Family History  Problem Relation Age of Onset  . Heart failure Father     Prior to Admission medications   Medication Sig Start Date End Date Taking? Authorizing Provider  Multiple Vitamin (MULTIVITAMIN WITH MINERALS) TABS tablet Take 1 tablet by mouth daily.   Yes Historical Provider, MD  Omega-3 Fatty Acids (FISH OIL PO) Take 1 capsule by mouth daily.   Yes Historical Provider, MD  traMADol (ULTRAM) 50 MG tablet Take 1 tablet (50 mg total) by mouth every 12 (twelve) hours as needed. 09/23/13  Yes Toy Cookey, MD  allopurinol (ZYLOPRIM) 100 MG tablet Take 100-200 mg by mouth daily. Take 100mg  for 15 days then increase to 200mg  for  15 days.    Historical Provider, MD  colchicine 0.6 MG tablet Take 0.6 mg by mouth 2 (two) times daily.    Historical Provider, MD  lisinopril (PRINIVIL,ZESTRIL) 20 MG tablet Take 20 mg by mouth daily.    Historical Provider, MD    Physical Exam: Filed Vitals:   02/25/14 1610 02/25/14 1807 02/25/14 1807 02/25/14 1817  BP: 103/66   112/77  Pulse: 82   87  Temp:    98.7 F (37.1 C)  TempSrc:    Oral  Resp: 18   18  SpO2: 97% 87% 94% 97%    Physical Exam  Constitutional: Appears well-developed and well-nourished. No distress.  HENT: Normocephalic. External right  and left ear normal. Dry MM Eyes: Conjunctivae and EOM are normal. PERRLA, scleral icterus noted   Neck: Normal ROM. Neck supple. No JVD. No tracheal deviation. No thyromegaly.  CVS: RRR, S1/S2 +, no murmurs, no gallops, no carotid bruit.  Pulmonary: Effort and breath sounds normal, no stridor, mild rhonchi at bases  Abdominal: Soft. BS +,  mild distension, no tenderness, rebound or guarding.  Musculoskeletal: Normal range of motion.  Lymphadenopathy: No lymphadenopathy noted, cervical, inguinal. Neuro: Alert. No cranial nerve deficit. Skin: Skin is warm and dry. Jaundiced  Psychiatric: Difficult to assess due to AMS  Labs on Admission:  Basic Metabolic Panel:  Recent Labs Lab 02/25/14 1542  NA 131*  K 2.0*  CL 86*  CO2 31  GLUCOSE 95  BUN 9  CREATININE <0.30*  CALCIUM 8.3*   Liver Function Tests:  Recent Labs Lab 02/25/14 1542  AST 360*  ALT 120*  ALKPHOS 437*  BILITOT 26.3*  PROT 7.2  ALBUMIN 2.6*     Recent Labs Lab 02/25/14 1633  AMMONIA 98*   CBC:  Recent Labs Lab 02/25/14 1542  WBC 9.3  NEUTROABS 7.7  HGB 14.7  HCT 42.7  MCV 105.4*  PLT 110*    EKG: Normal sinus rhythm, no ST/T wave changes  Gary PrestoMAGICK-Tanner Yeley, MD  [facility] Pager [phone]  If 7PM-7AM, please contact night-coverage [email or web address] Password [email or web address] 02/25/2014, 6:32 PM

## 2014-02-25 NOTE — ED Notes (Addendum)
Pt O2 dropped to 86% after valium admin. Dr Juleen China made aware that pt is on 3L O2

## 2014-02-25 NOTE — ED Notes (Signed)
US at bedside

## 2014-02-25 NOTE — ED Notes (Addendum)
Pt sts that his neighbors called EMS after they told him he was yellow. Pt A&O x3, but is making statements that do not pertain to conversation and are not logical. Pt sts "I need to get out of January so I can get to December." Pt skin is yellow including eyes and pt has petechiae to trunk. Pt in NAD

## 2014-02-25 NOTE — ED Notes (Signed)
Baird Kay, pt friend and neighbor [phone]

## 2014-02-25 NOTE — ED Notes (Signed)
Bed: GY18 Expected date: 02/25/14 Expected time: 2:46 PM Means of arrival: Ambulance Comments: ETOH

## 2014-02-25 NOTE — ED Notes (Addendum)
Pt friend sts that pt drinks approx 10-12, 24oz beers per day.

## 2014-02-25 NOTE — ED Notes (Addendum)
Hospitalist called and requested to speak to fried at bedside to obtain more information. Pt gave permission for his friend to talk to admitting physician about his medical care and personal info. Friend is speaking to Dr Izola Price  at this time.

## 2014-02-25 NOTE — ED Notes (Signed)
Pt from home via EMS-Pt neighbors called this am for well check, pt vitals were stable and pt refused transport at that time. EMS was called out again after his neighbors noticed that pt "was yellow." Pt denies hx of hepatic disease. Pt reports that his last consumption was 1 hour ago where he ingested alcoholic energy drinks mixed with liquor. Pt is alert to self and situation but sts that he is not oriented to time because he sts that he has not kept up with it in weeks. Pt in NAD

## 2014-02-26 ENCOUNTER — Inpatient Hospital Stay (HOSPITAL_COMMUNITY): Payer: No Typology Code available for payment source

## 2014-02-26 DIAGNOSIS — K7200 Acute and subacute hepatic failure without coma: Secondary | ICD-10-CM

## 2014-02-26 DIAGNOSIS — E876 Hypokalemia: Secondary | ICD-10-CM

## 2014-02-26 DIAGNOSIS — F1010 Alcohol abuse, uncomplicated: Secondary | ICD-10-CM

## 2014-02-26 LAB — COMPREHENSIVE METABOLIC PANEL
ALBUMIN: 2.3 g/dL — AB (ref 3.5–5.2)
ALT: 103 U/L — AB (ref 0–53)
ALT: 95 U/L — ABNORMAL HIGH (ref 0–53)
AST: 313 U/L — AB (ref 0–37)
AST: 299 U/L — ABNORMAL HIGH (ref 0–37)
Albumin: 2.2 g/dL — ABNORMAL LOW (ref 3.5–5.2)
Alkaline Phosphatase: 371 U/L — ABNORMAL HIGH (ref 39–117)
Alkaline Phosphatase: 345 U/L — ABNORMAL HIGH (ref 39–117)
Anion gap: 12 (ref 5–15)
Anion gap: 10 (ref 5–15)
BILIRUBIN TOTAL: 21.6 mg/dL — AB (ref 0.3–1.2)
BUN: 9 mg/dL (ref 6–23)
BUN: 8 mg/dL (ref 6–23)
CALCIUM: 7.5 mg/dL — AB (ref 8.4–10.5)
CHLORIDE: 89 meq/L — AB (ref 96–112)
CO2: 28 mmol/L (ref 19–32)
CO2: 26 mmol/L (ref 19–32)
CREATININE: 0.31 mg/dL — AB (ref 0.50–1.35)
Calcium: 7.3 mg/dL — ABNORMAL LOW (ref 8.4–10.5)
Chloride: 93 mEq/L — ABNORMAL LOW (ref 96–112)
Creatinine, Ser: 0.42 mg/dL — ABNORMAL LOW (ref 0.50–1.35)
GFR calc Af Amer: >90 mL/min (ref >90)
Glucose, Bld: 86 mg/dL (ref 70–99)
Glucose, Bld: 84 mg/dL (ref 70–99)
Potassium: 2.4 mmol/L — CL (ref 3.5–5.1)
Potassium: 2.7 mmol/L — CL (ref 3.5–5.1)
SODIUM: 129 mmol/L — AB (ref 135–145)
Sodium: 129 mmol/L — ABNORMAL LOW (ref 135–145)
Total Bilirubin: 22.1 mg/dL (ref 0.3–1.2)
Total Protein: 6.2 g/dL (ref 6.0–8.3)
Total Protein: 5.8 g/dL — ABNORMAL LOW (ref 6.0–8.3)

## 2014-02-26 LAB — URINALYSIS, ROUTINE W REFLEX MICROSCOPIC
GLUCOSE, UA: NEGATIVE mg/dL
Hgb urine dipstick: NEGATIVE
Ketones, ur: NEGATIVE mg/dL
Nitrite: POSITIVE — AB
PROTEIN: NEGATIVE mg/dL
Specific Gravity, Urine: 1.015 (ref 1.005–1.030)
Urobilinogen, UA: 4 mg/dL — ABNORMAL HIGH (ref 0.0–1.0)
pH: 6 (ref 5.0–8.0)

## 2014-02-26 LAB — RAPID URINE DRUG SCREEN, HOSP PERFORMED
Amphetamines: NOT DETECTED
Barbiturates: NOT DETECTED
Benzodiazepines: NOT DETECTED
COCAINE: NOT DETECTED
Opiates: NOT DETECTED
TETRAHYDROCANNABINOL: NOT DETECTED

## 2014-02-26 LAB — HEPATITIS PANEL, ACUTE
HCV Ab: NEGATIVE
Hep A IgM: NONREACTIVE
Hep B C IgM: NONREACTIVE
Hepatitis B Surface Ag: NEGATIVE

## 2014-02-26 LAB — CBC
HEMATOCRIT: 35.8 % — AB (ref 39.0–52.0)
Hemoglobin: 12.2 g/dL — ABNORMAL LOW (ref 13.0–17.0)
MCH: 35.9 pg — AB (ref 26.0–34.0)
MCHC: 34.1 g/dL (ref 30.0–36.0)
MCV: 105.3 fL — AB (ref 78.0–100.0)
Platelets: 110 10*3/uL — ABNORMAL LOW (ref 150–400)
RBC: 3.4 MIL/uL — ABNORMAL LOW (ref 4.22–5.81)
RDW: 16.1 % — AB (ref 11.5–15.5)
WBC: 9.4 10*3/uL (ref 4.0–10.5)

## 2014-02-26 LAB — URINE MICROSCOPIC-ADD ON

## 2014-02-26 LAB — MAGNESIUM
MAGNESIUM: 1.5 mg/dL (ref 1.5–2.5)
Magnesium: 1.7 mg/dL (ref 1.5–2.5)
Magnesium: 2 mg/dL (ref 1.5–2.5)

## 2014-02-26 LAB — LIPASE, BLOOD
Lipase: 136 U/L — ABNORMAL HIGH (ref 11–59)
Lipase: 128 U/L — ABNORMAL HIGH (ref 11–59)

## 2014-02-26 LAB — AMMONIA: AMMONIA: 73 umol/L — AB (ref 11–32)

## 2014-02-26 LAB — PHOSPHORUS: PHOSPHORUS: 2.5 mg/dL (ref 2.3–4.6)

## 2014-02-26 LAB — ETHANOL: ALCOHOL ETHYL (B): 159 mg/dL — AB (ref 0–9)

## 2014-02-26 LAB — TSH: TSH: 2.625 u[IU]/mL (ref 0.350–4.500)

## 2014-02-26 MED ORDER — VITAMINS A & D EX OINT
TOPICAL_OINTMENT | CUTANEOUS | Status: AC
  Administered 2014-02-26: 5
  Filled 2014-02-26: qty 5

## 2014-02-26 MED ORDER — POTASSIUM CHLORIDE 10 MEQ/100ML IV SOLN
10.0000 meq | Freq: Once | INTRAVENOUS | Status: AC
  Administered 2014-02-26: 10 meq via INTRAVENOUS
  Filled 2014-02-26: qty 100

## 2014-02-26 MED ORDER — PIPERACILLIN-TAZOBACTAM 3.375 G IVPB
3.3750 g | Freq: Three times a day (TID) | INTRAVENOUS | Status: DC
  Administered 2014-02-26 – 2014-03-01 (×9): 3.375 g via INTRAVENOUS
  Filled 2014-02-26 (×11): qty 50

## 2014-02-26 MED ORDER — VANCOMYCIN HCL IN DEXTROSE 1-5 GM/200ML-% IV SOLN
1000.0000 mg | Freq: Three times a day (TID) | INTRAVENOUS | Status: DC
  Administered 2014-02-26 – 2014-02-27 (×2): 1000 mg via INTRAVENOUS
  Filled 2014-02-26 (×3): qty 200

## 2014-02-26 MED ORDER — MAGNESIUM SULFATE 2 GM/50ML IV SOLN
2.0000 g | Freq: Once | INTRAVENOUS | Status: AC
  Administered 2014-02-26: 2 g via INTRAVENOUS
  Filled 2014-02-26: qty 50

## 2014-02-26 MED ORDER — IBUPROFEN 200 MG PO TABS
400.0000 mg | ORAL_TABLET | ORAL | Status: DC | PRN
Start: 2014-02-26 — End: 2014-03-04
  Administered 2014-02-26 – 2014-03-03 (×2): 400 mg via ORAL
  Filled 2014-02-26 (×2): qty 2

## 2014-02-26 NOTE — Progress Notes (Addendum)
Patient had a low grade temperature Dr. Red Christians notified and ordered PRN Ibuprofen given and effective.

## 2014-02-26 NOTE — Progress Notes (Signed)
[facility] PROGRESS NOTE  Gary [name] AST:[id number] DOB: 1950/09/03 DOA: 02/25/2014 PCP: No primary care provider on file.  Assessment/Plan: 1. Acute alcoholic hepatitis 1. LFT's slowly trending down 2. abd [place] without biliary obstruction 3. Cont supportive care 4. Avoid hepatotoxic agents 2. Acute encephalopathy 1. Seems stable this AM 3. ETOH abuse 1. Cont on CIWA protocol 2. Resting tremor noted in room 4. Hypokalemia 1. Correct as needed 5. Hypomagnesemia  1. Cont to follow and replace 2. Likely related to ETOH abuse 6. Hyponatremia 1. Follow 2. Likely from etoh abuse 7. Thrombocytopenia 1. Stable and >100k 2. Monitor 8. DVT prophylaixs 1. SCD's  Code Status: Full Family Communication: Pt in room (indicate person spoken with, relationship, and if by phone, the number) Disposition Plan: Pending   Consultants:    Procedures:    Antibiotics:   (indicate start date, and stop date if known)  HPI/Subjective: No acute events noted overnight  Objective: Filed Vitals:   02/25/14 2030 02/26/14 0300 02/26/14 0530 02/26/14 1200  BP: 127/73 120/78  136/90  Pulse: 89 88  99  Temp: 98.8 F (37.1 C) 99.3 F (37.4 C)    TempSrc: Oral Oral    Resp: 18 18    Height: 5\' 9"  (1.753 m)     Weight: 85.3 kg (188 lb 0.8 oz)  89.5 kg (197 lb 5 oz)   SpO2: 100% 100%      Intake/Output Summary (Last 24 hours) at 02/26/14 1354 Last data filed at 02/26/14 1300  Gross per 24 hour  Intake 838.33 ml  Output    400 ml  Net 438.33 ml   Filed Weights   02/25/14 2030 02/26/14 0530  Weight: 85.3 kg (188 lb 0.8 oz) 89.5 kg (197 lb 5 oz)    Exam:   General:  Awake, in nad  Cardiovascular: regular, s1, s2  Respiratory: normal resp effort, no wheezing  Abdomen: soft,nondistended  Musculoskeletal: perfused, no clubbing   Data Reviewed: Basic Metabolic Panel:  Recent Labs Lab 02/25/14 1542 02/25/14 2308 02/26/14 0002 02/26/14 0520  NA 131* 129*  --   129*  K 2.0* 2.4*  --  2.7*  CL 86* 89*  --  93*  CO2 31 28  --  26  GLUCOSE 95 86  --  84  BUN 9 9  --  8  CREATININE <0.30* 0.42*  --  0.31*  CALCIUM 8.3* 7.5*  --  7.3*  MG  --  1.7 2.0 1.5  PHOS  --  2.5  --   --    Liver Function Tests:  Recent Labs Lab 02/25/14 1542 02/25/14 2308 02/26/14 0520  AST 360* 313* 299*  ALT 120* 103* 95*  ALKPHOS 437* 371* 345*  BILITOT 26.3* 22.1* 21.6*  PROT 7.2 6.2 5.8*  ALBUMIN 2.6* 2.3* 2.2*    Recent Labs Lab 02/25/14 2308 02/26/14 0520  LIPASE 136* 128*    Recent Labs Lab 02/25/14 1633 02/25/14 2308  AMMONIA 98* 73*   CBC:  Recent Labs Lab 02/25/14 1542 02/25/14 2308 02/26/14 0520  WBC 9.3 10.1 9.4  NEUTROABS 7.7  --   --   HGB 14.7 12.8* 12.2*  HCT 42.7 37.6* 35.8*  MCV 105.4* 105.0* 105.3*  PLT 110* 119* 110*   Cardiac Enzymes: No results for input(s): CKTOTAL, CKMB, CKMBINDEX, TROPONINI in the last 168 hours. BNP (last 3 results) No results for input(s): PROBNP in the last 8760 hours. CBG: No results for input(s): GLUCAP in the last 168 hours.  No results found for this or any previous visit (from the past 240 hour(s)).   Studies: X-ray Chest Pa And Lateral  02/25/2014   CLINICAL DATA:  Acute onset of dyspnea for 1 day. Initial encounter.  EXAM: CHEST  2 VIEW  COMPARISON:  Chest radiograph performed 09/23/2013  FINDINGS: The lungs are well-aerated. There is chronic elevation of the right hemidiaphragm. Minimal left basilar atelectasis is noted. There is no evidence of pleural effusion or pneumothorax.  The heart is borderline enlarged. No acute osseous abnormalities are seen. Multiple chronic right-sided rib deformities are again noted.  IMPRESSION: Chronic elevation of the right hemidiaphragm. Minimal left basilar atelectasis noted. Borderline cardiomegaly.   Electronically Signed   By: Roanna RaiderJeffery  Chang M.D.   On: 02/25/2014 21:32   Dg Forearm Left  02/25/2014   CLINICAL DATA:  Forearm pain following fall,  initial encounter  EXAM: LEFT FOREARM - 2 VIEW  COMPARISON:  None.  FINDINGS: No acute fracture dislocation is noted. Some mild prominence of the cortexes noted in the distal ulna. This may be related to prior trauma.  IMPRESSION: No acute abnormality noted.   Electronically Signed   By: Alcide CleverMark  Lukens M.D.   On: 02/25/2014 19:33   Dg Wrist Complete Left  02/25/2014   CLINICAL DATA:  Fall.  EXAM: LEFT WRIST - COMPLETE 3+ VIEW  COMPARISON:  Forearm series performed today.  FINDINGS: Old healed fracture through the distal left ulnar metaphysis. No acute fracture. Joint spaces are maintained.  IMPRESSION: No acute bony abnormality.   Electronically Signed   By: Charlett NoseKevin  Dover M.D.   On: 02/25/2014 19:33   [place] Abdomen Complete  02/25/2014   CLINICAL DATA:  Elevated bilirubin, hepatitis  EXAM: ULTRASOUND ABDOMEN COMPLETE  COMPARISON:  None.  FINDINGS: Gallbladder: Gallbladder wall is mildly thickened. The gallbladder is nondistended. Small amount of sludge with in the gallbladder. No pericholecystic fluid. Negative sonographic Murphy's sign.  Common bile duct: Diameter: Normal at 3 mm.  Liver: The liver is enlarged and densely echogenic. No biliary duct dilatation is evident.  IVC: No abnormality visualized.  Pancreas: Poorly visualized due to enlarged liver  Spleen: Size and appearance within normal limits.  Right Kidney: Length: 12.8 cm. Echogenicity within normal limits. No mass or hydronephrosis visualized.  Left Kidney: Length: 13.5 cm. Echogenicity within normal limits. No mass or hydronephrosis visualized.  Abdominal aorta: No aneurysm visualized.  Other findings: No ascites  IMPRESSION: 1. Densely echogenic enlarged liver is consistent with history of hepatocellular disease (hepatitis). 2. Gallbladder wall thickening without distension. Small sludge. Negative sonographic Murphy's sign. 3. No evidence of biliary obstruction.   Electronically Signed   By: Genevive BiStewart  Edmunds M.D.   On: 02/25/2014 20:32     Scheduled Meds: . folic acid  1 mg Intravenous Daily  . lactulose  20 g Oral BID  . multivitamin with minerals  1 tablet Oral Daily  . pantoprazole (PROTONIX) IV  40 mg Intravenous Q24H  . sodium chloride  3 mL Intravenous Q12H  . thiamine  100 mg Intravenous Daily   Continuous Infusions: . sodium chloride 100 mL/hr at 02/25/14 2031    Active Problems:   Hepatitis   Acute hepatitis  Time spent: 30min  CHIU, STEPHEN K  [facility] Pager [phone]. If 7PM-7AM, please contact night-coverage at [email or web address], password [email or web address] 02/26/2014, 1:54 PM  LOS: 1 day

## 2014-02-26 NOTE — Progress Notes (Signed)
ANTIBIOTIC CONSULT NOTE - INITIAL  Pharmacy Consult for Vanc/Zosyn Indication: sepsis  No Known Allergies  Patient Measurements: Height: 5\' 9"  (175.3 cm) Weight: 197 lb 5 oz (89.5 kg) IBW/kg (Calculated) : 70.7   Vital Signs: Temp: 98.2 F (36.8 C) (12/27 1610) Temp Source: Oral (12/27 1610) BP: 111/71 mmHg (12/27 1300) Pulse Rate: 100 (12/27 1300) Intake/Output from previous day: 12/26 0701 - 12/27 0700 In: 548.3 [I.V.:248.3; IV Piggyback:300] Out: 400 [Urine:400] Intake/Output from this shift: Total I/O In: 290 [P.O.:240; IV Piggyback:50] Out: -   Labs:  Recent Labs  02/25/14 1542 02/25/14 2308 02/26/14 0520  WBC 9.3 10.1 9.4  HGB 14.7 12.8* 12.2*  PLT 110* 119* 110*  CREATININE <0.30* 0.42* 0.31*   Estimated Creatinine Clearance: 104.5 mL/min (by C-G formula based on Cr of 0.31). No results for input(s): VANCOTROUGH, VANCOPEAK, VANCORANDOM, GENTTROUGH, GENTPEAK, GENTRANDOM, TOBRATROUGH, TOBRAPEAK, TOBRARND, AMIKACINPEAK, AMIKACINTROU, AMIKACIN in the last 72 hours.   Microbiology: No results found for this or any previous visit (from the past 720 hour(s)).  Medical History: Past Medical History  Diagnosis Date  . Hypertension     Medications:  Scheduled:  . folic acid  1 mg Intravenous Daily  . lactulose  20 g Oral BID  . multivitamin with minerals  1 tablet Oral Daily  . pantoprazole (PROTONIX) IV  40 mg Intravenous Q24H  . sodium chloride  3 mL Intravenous Q12H  . thiamine  100 mg Intravenous Daily   Assessment: 63 y.o. Male presents with acute alcoholic hepatitis, acute encephalopathy secondary to acute alcohol intoxication.  Pharmacy consulted to dose Vanc and zosyn for sepsis.  Goal of Therapy:  Vancomycin trough level 15-20 mcg/ml  Vanc and zosyn per renal function  Plan:   Vanc 1gm IV q8h  Zosyn 3.375mg  IV q8h EI  vanc trough at steady state as needed  Follow renal function/cultures/clinical course    Arley Phenix  RPh 02/26/2014, 5:56 PM Pager [phone]

## 2014-02-26 NOTE — Progress Notes (Signed)
Patient's sister [name] called and wanted to talk to MD regarding patient, patient stated it is okay to give his sister michele his health information, he gave his sister # as well [phone], Dr. Rhona Leavens notified with the information to call.

## 2014-02-26 NOTE — Progress Notes (Signed)
Lab called about K+2.4 and T. Bili 22.1 paged Gary [name] to let him know. We will continue to monitor pt.

## 2014-02-26 NOTE — Progress Notes (Addendum)
Lab called with a critical on pt, K+2.7 and T. Bili 21.6. Paged Tom with [facility] will continue to monitor patient. Tom put in orders for magnesium.

## 2014-02-27 LAB — COMPREHENSIVE METABOLIC PANEL
ALK PHOS: 303 U/L — AB (ref 39–117)
ALT: 81 U/L — AB (ref 0–53)
AST: 231 U/L — ABNORMAL HIGH (ref 0–37)
Albumin: 2.1 g/dL — ABNORMAL LOW (ref 3.5–5.2)
Anion gap: 5 (ref 5–15)
BUN: 9 mg/dL (ref 6–23)
CO2: 26 mmol/L (ref 19–32)
Calcium: 7 mg/dL — ABNORMAL LOW (ref 8.4–10.5)
Chloride: 92 mEq/L — ABNORMAL LOW (ref 96–112)
Creatinine, Ser: 0.38 mg/dL — ABNORMAL LOW (ref 0.50–1.35)
GFR calc non Af Amer: >90 mL/min (ref >90)
Glucose, Bld: 120 mg/dL — ABNORMAL HIGH (ref 70–99)
POTASSIUM: 2.7 mmol/L — AB (ref 3.5–5.1)
SODIUM: 123 mmol/L — AB (ref 135–145)
Total Bilirubin: 24 mg/dL (ref 0.3–1.2)
Total Protein: 5.5 g/dL — ABNORMAL LOW (ref 6.0–8.3)

## 2014-02-27 LAB — CBC WITH DIFFERENTIAL/PLATELET
BASOS ABS: 0.1 10*3/uL (ref 0.0–0.1)
BASOS PCT: 1 % (ref 0–1)
Eosinophils Absolute: 0.2 10*3/uL (ref 0.0–0.7)
Eosinophils Relative: 2 % (ref 0–5)
HCT: 33.1 % — ABNORMAL LOW (ref 39.0–52.0)
Hemoglobin: 11.5 g/dL — ABNORMAL LOW (ref 13.0–17.0)
Lymphocytes Relative: 8 % — ABNORMAL LOW (ref 12–46)
Lymphs Abs: 0.6 10*3/uL — ABNORMAL LOW (ref 0.7–4.0)
MCH: 36.4 pg — AB (ref 26.0–34.0)
MCHC: 34.7 g/dL (ref 30.0–36.0)
MCV: 104.7 fL — ABNORMAL HIGH (ref 78.0–100.0)
Monocytes Absolute: 0.6 10*3/uL (ref 0.1–1.0)
Monocytes Relative: 7 % (ref 3–12)
NEUTROS PCT: 82 % — AB (ref 43–77)
Neutro Abs: 6.5 10*3/uL (ref 1.7–7.7)
Platelets: 95 10*3/uL — ABNORMAL LOW (ref 150–400)
RBC: 3.16 MIL/uL — ABNORMAL LOW (ref 4.22–5.81)
RDW: 15.9 % — ABNORMAL HIGH (ref 11.5–15.5)
WBC: 7.9 10*3/uL (ref 4.0–10.5)

## 2014-02-27 LAB — MAGNESIUM: MAGNESIUM: 1.8 mg/dL (ref 1.5–2.5)

## 2014-02-27 LAB — URINE CULTURE
Colony Count: NO GROWTH
Culture: NO GROWTH

## 2014-02-27 LAB — AMMONIA: AMMONIA: 105 umol/L — AB (ref 11–32)

## 2014-02-27 MED ORDER — LACTULOSE 10 GM/15ML PO SOLN
30.0000 g | Freq: Every day | ORAL | Status: DC
  Administered 2014-03-02 – 2014-03-04 (×3): 30 g via ORAL
  Filled 2014-02-27 (×5): qty 45

## 2014-02-27 MED ORDER — VANCOMYCIN HCL 10 G IV SOLR
1250.0000 mg | Freq: Two times a day (BID) | INTRAVENOUS | Status: DC
  Administered 2014-02-27 – 2014-03-01 (×4): 1250 mg via INTRAVENOUS
  Filled 2014-02-27 (×5): qty 1250

## 2014-02-27 MED ORDER — MAGNESIUM SULFATE 2 GM/50ML IV SOLN
2.0000 g | Freq: Once | INTRAVENOUS | Status: AC
  Administered 2014-02-27: 2 g via INTRAVENOUS
  Filled 2014-02-27: qty 50

## 2014-02-27 MED ORDER — PREDNISOLONE 5 MG PO TABS
40.0000 mg | ORAL_TABLET | Freq: Every day | ORAL | Status: DC
  Administered 2014-02-27 – 2014-03-04 (×6): 40 mg via ORAL
  Filled 2014-02-27 (×6): qty 8

## 2014-02-27 MED ORDER — POTASSIUM CHLORIDE 10 MEQ/100ML IV SOLN
10.0000 meq | INTRAVENOUS | Status: AC
  Administered 2014-02-27 (×3): 10 meq via INTRAVENOUS
  Filled 2014-02-27 (×3): qty 100

## 2014-02-27 NOTE — Progress Notes (Signed)
[facility] PROGRESS NOTE  Gary [name] ZOX:[id number] DOB: 15-Oct-1950 DOA: 02/25/2014 PCP: No primary care provider on file.  Assessment/Plan: 1. Acute alcoholic hepatitis 1. LFT's slowly trending down 2. abd [place] without biliary obstruction 3. Cont supportive care 4. Avoid hepatotoxic agents 5. Calculated DF (Discriminant Function) of 42 (greater than 32), thus will initiate systemic steroids for severe alcoholic hepatitis, treatment for 28 days, then taper afterwards 6. If no significant improvement despite steroids or if pt worsens, would consult GI 2. Acute encephalopathy 1. Seems stable this AM 3. ETOH abuse 1. Cont on CIWA protocol 2. Seems to be less tremors today 4. Hypokalemia 1. Correct as needed 5. Hypomagnesemia  1. Cont to follow and replace 2. Likely related to ETOH abuse 6. Hyponatremia 1. Follow 2. Likely from etoh abuse 7. Thrombocytopenia 1. Trending down - less than 100k this AM 2. Monitor 8. DVT prophylaixs 1. SCD's  Code Status: Full Family Communication: Pt in room, family at bedside Disposition Plan: Pending   Consultants:    Procedures:    Antibiotics:    HPI/Subjective: No complaints. Pt without acute events noted.  Objective: Filed Vitals:   02/26/14 1610 02/26/14 1800 02/26/14 2359 02/27/14 0704  BP:  142/83 118/70 125/76  Pulse:  93 101 89  Temp: 98.2 F (36.8 C)  98.8 F (37.1 C) 99 F (37.2 C)  TempSrc: Oral  Oral Oral  Resp:   20 20  Height:      Weight:    92.9 kg (204 lb 12.9 oz)  SpO2:   97% 100%    Intake/Output Summary (Last 24 hours) at 02/27/14 1156 Last data filed at 02/27/14 0345  Gross per 24 hour  Intake    123 ml  Output    275 ml  Net   -152 ml   Filed Weights   02/25/14 2030 02/26/14 0530 02/27/14 0704  Weight: 85.3 kg (188 lb 0.8 oz) 89.5 kg (197 lb 5 oz) 92.9 kg (204 lb 12.9 oz)    Exam:   General:  Awake, in nad  Cardiovascular: regular, s1, s2  Respiratory: normal resp effort,  no wheezing  Abdomen: soft,nondistended  Musculoskeletal: perfused, no clubbing   Data Reviewed: Basic Metabolic Panel:  Recent Labs Lab 02/25/14 1542 02/25/14 2308 02/26/14 0002 02/26/14 0520 02/27/14 0516  NA 131* 129*  --  129* 123*  K 2.0* 2.4*  --  2.7* 2.7*  CL 86* 89*  --  93* 92*  CO2 31 28  --  26 26  GLUCOSE 95 86  --  84 120*  BUN 9 9  --  8 9  CREATININE <0.30* 0.42*  --  0.31* 0.38*  CALCIUM 8.3* 7.5*  --  7.3* 7.0*  MG  --  1.7 2.0 1.5 1.8  PHOS  --  2.5  --   --   --    Liver Function Tests:  Recent Labs Lab 02/25/14 1542 02/25/14 2308 02/26/14 0520 02/27/14 0516  AST 360* 313* 299* 231*  ALT 120* 103* 95* 81*  ALKPHOS 437* 371* 345* 303*  BILITOT 26.3* 22.1* 21.6* 24.0*  PROT 7.2 6.2 5.8* 5.5*  ALBUMIN 2.6* 2.3* 2.2* 2.1*    Recent Labs Lab 02/25/14 2308 02/26/14 0520  LIPASE 136* 128*    Recent Labs Lab 02/25/14 1633 02/25/14 2308  AMMONIA 98* 73*   CBC:  Recent Labs Lab 02/25/14 1542 02/25/14 2308 02/26/14 0520 02/27/14 0516  WBC 9.3 10.1 9.4 7.9  NEUTROABS 7.7  --   --  6.5  HGB 14.7 12.8* 12.2* 11.5*  HCT 42.7 37.6* 35.8* 33.1*  MCV 105.4* 105.0* 105.3* 104.7*  PLT 110* 119* 110* 95*   Cardiac Enzymes: No results for input(s): CKTOTAL, CKMB, CKMBINDEX, TROPONINI in the last 168 hours. BNP (last 3 results) No results for input(s): PROBNP in the last 8760 hours. CBG: No results for input(s): GLUCAP in the last 168 hours.  No results found for this or any previous visit (from the past 240 hour(s)).   Studies: X-ray Chest Pa And Lateral  02/25/2014   CLINICAL DATA:  Acute onset of dyspnea for 1 day. Initial encounter.  EXAM: CHEST  2 VIEW  COMPARISON:  Chest radiograph performed 09/23/2013  FINDINGS: The lungs are well-aerated. There is chronic elevation of the right hemidiaphragm. Minimal left basilar atelectasis is noted. There is no evidence of pleural effusion or pneumothorax.  The heart is borderline enlarged. No  acute osseous abnormalities are seen. Multiple chronic right-sided rib deformities are again noted.  IMPRESSION: Chronic elevation of the right hemidiaphragm. Minimal left basilar atelectasis noted. Borderline cardiomegaly.   Electronically Signed   By: Roanna Raider M.D.   On: 02/25/2014 21:32   Dg Forearm Left  02/25/2014   CLINICAL DATA:  Forearm pain following fall, initial encounter  EXAM: LEFT FOREARM - 2 VIEW  COMPARISON:  None.  FINDINGS: No acute fracture dislocation is noted. Some mild prominence of the cortexes noted in the distal ulna. This may be related to prior trauma.  IMPRESSION: No acute abnormality noted.   Electronically Signed   By: Alcide Clever M.D.   On: 02/25/2014 19:33   Dg Wrist Complete Left  02/25/2014   CLINICAL DATA:  Fall.  EXAM: LEFT WRIST - COMPLETE 3+ VIEW  COMPARISON:  Forearm series performed today.  FINDINGS: Old healed fracture through the distal left ulnar metaphysis. No acute fracture. Joint spaces are maintained.  IMPRESSION: No acute bony abnormality.   Electronically Signed   By: Charlett Nose M.D.   On: 02/25/2014 19:33   US Abdomen Complete  02/25/2014   CLINICAL DATA:  Elevated bilirubin, hepatitis  EXAM: ULTRASOUND ABDOMEN COMPLETE  COMPARISON:  None.  FINDINGS: Gallbladder: Gallbladder wall is mildly thickened. The gallbladder is nondistended. Small amount of sludge with in the gallbladder. No pericholecystic fluid. Negative sonographic Murphy's sign.  Common bile duct: Diameter: Normal at 3 mm.  Liver: The liver is enlarged and densely echogenic. No biliary duct dilatation is evident.  IVC: No abnormality visualized.  Pancreas: Poorly visualized due to enlarged liver  Spleen: Size and appearance within normal limits.  Right Kidney: Length: 12.8 cm. Echogenicity within normal limits. No mass or hydronephrosis visualized.  Left Kidney: Length: 13.5 cm. Echogenicity within normal limits. No mass or hydronephrosis visualized.  Abdominal aorta: No aneurysm  visualized.  Other findings: No ascites  IMPRESSION: 1. Densely echogenic enlarged liver is consistent with history of hepatocellular disease (hepatitis). 2. Gallbladder wall thickening without distension. Small sludge. Negative sonographic Murphy's sign. 3. No evidence of biliary obstruction.   Electronically Signed   By: Genevive Bi M.D.   On: 02/25/2014 20:32   Dg Chest Port 1 View  02/26/2014   CLINICAL DATA:  Fever and weakness  EXAM: PORTABLE CHEST - 1 VIEW  COMPARISON:  02/25/2014  FINDINGS: Cardiac shadow is stable. The right hemidiaphragm is again elevated. Old rib fractures are again seen on the right. The lungs are clear bilaterally.  IMPRESSION: No active disease.   Electronically Signed   By: Loraine Leriche  Lukens M.D.   On: 02/26/2014 18:47    Scheduled Meds: . folic acid  1 mg Intravenous Daily  . lactulose  20 g Oral BID  . multivitamin with minerals  1 tablet Oral Daily  . pantoprazole (PROTONIX) IV  40 mg Intravenous Q24H  . piperacillin-tazobactam (ZOSYN)  IV  3.375 g Intravenous Q8H  . sodium chloride  3 mL Intravenous Q12H  . thiamine  100 mg Intravenous Daily  . vancomycin  1,250 mg Intravenous Q12H   Continuous Infusions: . sodium chloride 100 mL/hr at 02/27/14 0749    Active Problems:   Hepatitis   Acute hepatitis  Time spent: 30min  Wyndi Northrup K  [facility] Pager [phone]. If 7PM-7AM, please contact night-coverage at [email or web address], password [email or web address] 02/27/2014, 11:56 AM  LOS: 2 days

## 2014-02-27 NOTE — Progress Notes (Signed)
ANTIBIOTIC CONSULT NOTE   Pharmacy Consult for Vancomycin/Zosyn Indication: sepsis  No Known Allergies  Patient Measurements: Height: 5\' 9"  (175.3 cm) Weight: 204 lb 12.9 oz (92.9 kg) IBW/kg (Calculated) : 70.7   Vital Signs: Temp: 99 F (37.2 C) (12/28 0704) Temp Source: Oral (12/28 0704) BP: 125/76 mmHg (12/28 0704) Pulse Rate: 89 (12/28 0704) Intake/Output from previous day: 12/27 0701 - 12/28 0700 In: 493 [P.O.:240; I.V.:3; IV Piggyback:250] Out: 275 [Urine:275] Intake/Output from this shift:    Labs:  Recent Labs  02/25/14 2308 02/26/14 0520 02/27/14 0516  WBC 10.1 9.4 7.9  HGB 12.8* 12.2* 11.5*  PLT 119* 110* 95*  CREATININE 0.42* 0.31* 0.38*   Estimated Creatinine Clearance: 106.4 mL/min (by C-G formula based on Cr of 0.38). No results for input(s): VANCOTROUGH, VANCOPEAK, VANCORANDOM, GENTTROUGH, GENTPEAK, GENTRANDOM, TOBRATROUGH, TOBRAPEAK, TOBRARND, AMIKACINPEAK, AMIKACINTROU, AMIKACIN in the last 72 hours.   Microbiology: No results found for this or any previous visit (from the past 720 hour(s)).  Assessment: 63 y.o. Male presents with acute alcoholic hepatitis, acute encephalopathy secondary to acute alcohol intoxication. Pharmacy consulted to dose Vanc and zosyn for sepsis.  12/27 >>Vanc >> 12/27 >> Zosyn >>   Tmax:100.5 WBCs:WNL Renal:Scr 0.3, normalized CrCl = 64ml/min (using Scr - 0.8)  12/27 blood: in process 12/27 urine: in process   Goal of Therapy:  Vancomycin trough level 15-20 mcg/ml  Vanc and zosyn per renal function  Plan:   Change Vancomycin to 1250mg  IV q12h as suspect q8h dose will result in supratherapeutic troughs as SCr is likely overestimating true renal function secondary to EtOHism/malnutrition  Continue Zosyn 3.375mg  IV q8h EI  vancomycin trough at steady state as needed  Follow renal function/cultures/clinical course  Juliette Alcide, PharmD, BCPS.   Pager: [phone] 02/27/2014 8:04 AM

## 2014-02-27 NOTE — Progress Notes (Signed)
Nutrition Note  Pt screened as positive result from Malnutrition Screening Tool. Pt was receiving assistance with voiding from Nurse Tech on first attempt. Pt resting soundly on second attempt, no family present. Will re-attempt assessment on 12/29.  Lloyd Huger MS RD LDN Clinical Dietitian Pager:[phone]

## 2014-02-28 DIAGNOSIS — F10239 Alcohol dependence with withdrawal, unspecified: Secondary | ICD-10-CM

## 2014-02-28 DIAGNOSIS — F1020 Alcohol dependence, uncomplicated: Secondary | ICD-10-CM | POA: Diagnosis present

## 2014-02-28 LAB — COMPREHENSIVE METABOLIC PANEL
ALBUMIN: 1.9 g/dL — AB (ref 3.5–5.2)
ALK PHOS: 276 U/L — AB (ref 39–117)
ALT: 68 U/L — ABNORMAL HIGH (ref 0–53)
AST: 145 U/L — AB (ref 0–37)
Anion gap: 6 (ref 5–15)
BILIRUBIN TOTAL: 24 mg/dL — AB (ref 0.3–1.2)
BUN: 9 mg/dL (ref 6–23)
CHLORIDE: 99 meq/L (ref 96–112)
CO2: 24 mmol/L (ref 19–32)
Calcium: 7 mg/dL — ABNORMAL LOW (ref 8.4–10.5)
Creatinine, Ser: <0.3 mg/dL — ABNORMAL LOW (ref 0.50–1.35)
Glucose, Bld: 135 mg/dL — ABNORMAL HIGH (ref 70–99)
POTASSIUM: 3 mmol/L — AB (ref 3.5–5.1)
Sodium: 129 mmol/L — ABNORMAL LOW (ref 135–145)
Total Protein: 5.2 g/dL — ABNORMAL LOW (ref 6.0–8.3)

## 2014-02-28 MED ORDER — POTASSIUM CHLORIDE CRYS ER 20 MEQ PO TBCR
40.0000 meq | EXTENDED_RELEASE_TABLET | Freq: Two times a day (BID) | ORAL | Status: AC
  Administered 2014-02-28 (×2): 40 meq via ORAL
  Filled 2014-02-28 (×2): qty 2

## 2014-02-28 MED ORDER — MAGNESIUM SULFATE 2 GM/50ML IV SOLN
2.0000 g | Freq: Once | INTRAVENOUS | Status: AC
  Administered 2014-02-28: 2 g via INTRAVENOUS
  Filled 2014-02-28: qty 50

## 2014-02-28 MED ORDER — ENSURE COMPLETE PO LIQD
237.0000 mL | ORAL | Status: DC
  Administered 2014-03-01: 237 mL via ORAL

## 2014-02-28 NOTE — Consult Note (Signed)
Gary [name] Psychiatry Consult   Reason for Consult:  Alcohol dependence/detox Referring Physician:  Dr. Dolan Gary [name] is an 63 y.o. male. Total Time spent with patient: 45 minutes  Assessment: AXIS I:  Alcohol dependence with complicated withdrawal AXIS II:  Deferred AXIS III:   Past Medical History  Diagnosis Date  . Hypertension    AXIS IV:  chronic substance abuse AXIS V:  51-60 moderate symptoms  Plan:  Supportive therapy provided about ongoing stressors.  Return tomorrow with AA resources and to encourage him to get help for his alcohol issues.  Dr. Sabra Heck reviewed the patient and concurs with the plan.  Subjective:   Gary [name] is a 63 y.o. male patient does not want assistance for alcohol detox, "I don't have a problem."  HPI:  The patient was found by his neighbor in a lethargic state and brought to the hospital.  Jenny [name] reports he was lying on the couch with his cat, drinking a beer and fell asleep for "a long time".  When he awakened he drank another beer.   Her reports his neighbor came over and talked him into going to the hospital because he felt the neighbor felt he should quit drinking.  The patient is in an acute hepatic state with an Ativan IV detox.  Gary [name] minimizes his drinking:  2 tall ones (beers) daily and 4 on Sat/Sun.  Denies any drugs or medications except one ibuprofen for back pain (does not know the strength and may an underestimate).  Denies depression, suicidal/homicidal ideations, hallucinations, and when asked about alcohol issues "I don't have a problem."  He did admit to going to one AA meeting but did not like it, "everyone was smoking".  Encouraged to try new groups to no avail.  No previous detox or rehabs.  He lives with his wife who is from [place] and is there visiting (and helping) their daughter who had their first [place] in September.  She is scheduled to return the first of March.  His cognition is slowed, must likely due to elevated ammonia  levels.  He refused any assistance to help him refrain from alcohol use.  This provider asked him to reconsider and think about it, will return tomorrow. HPI Elements:   Location:  generalized. Quality:  chronic. Severity:  moderate. Timing:  intermittently. Duration:  years. Context:  stressors, addiction.  Past Psychiatric History: Past Medical History  Diagnosis Date  . Hypertension     reports that he has never smoked. He has never used smokeless tobacco. He reports that he drinks about 4.8 oz of alcohol per week. He reports that he does not use illicit drugs. Family History  Problem Relation Age of Onset  . Heart failure Father      Living Arrangements: Alone   Abuse/Neglect [facility]) Physical Abuse: Denies Verbal Abuse: Denies Sexual Abuse: Denies Allergies:  No Known Allergies  ACT Assessment Complete:  No:   Past Psychiatric History: Diagnosis:  Alcohol dependence  Hospitalizations:  Denies  Outpatient Care:  Denies  Substance Abuse Care:  AA meeting x 1  Self-Mutilation:  None  Suicidal Attempts:  None   Homicidal Behaviors:  None   Violent Behaviors:  None   Place of Residence:  [place], [place] Marital Status:  Married Employed/Unemployed:  Retired Education:  BA in fine arts Family Supports:  Wife, 2 older children, friends Objective: Blood pressure 118/81, pulse 120, temperature 98.3 F (36.8 C), temperature source Oral, resp. rate 19, height 5'  9" (1.753 m), weight 204 lb 9.4 oz (92.8 kg), SpO2 100 %.Body mass index is 30.2 kg/(m^2). Results for orders placed or performed during the hospital encounter of 02/25/14 (from the past 72 hour(s))  Comprehensive metabolic panel     Status: Abnormal   Collection Time: 02/25/14  3:42 PM  Result Value Ref Range   Sodium 131 (L) 135 - 145 mmol/L    Comment: Please note change in reference range. DELTA CHECK NOTED REPEATED TO VERIFY    Potassium 2.0 (LL) 3.5 - 5.1 mmol/L    Comment: Please note change in reference  range. DELTA CHECK NOTED REPEATED TO VERIFY CRITICAL RESULT CALLED TO, READ BACK BY AND VERIFIED WITH: HALL,K. RN AT 1720 02/25/14 MULLINS,T    Chloride 86 (L) 96 - 112 mEq/L   CO2 31 19 - 32 mmol/L   Glucose, Bld 95 70 - 99 mg/dL   BUN 9 6 - 23 mg/dL   Creatinine, Ser <0.30 (L) 0.50 - 1.35 mg/dL   Calcium 8.3 (L) 8.4 - 10.5 mg/dL   Total Protein 7.2 6.0 - 8.3 g/dL   Albumin 2.6 (L) 3.5 - 5.2 g/dL   AST 360 (H) 0 - 37 U/L   ALT 120 (H) 0 - 53 U/L   Alkaline Phosphatase 437 (H) 39 - 117 U/L   Total Bilirubin 26.3 (HH) 0.3 - 1.2 mg/dL    Comment: ICTERIC SPECIMEN REPEATED TO VERIFY CRITICAL RESULT CALLED TO, READ BACK BY AND VERIFIED WITH: HALL,K. RN AT 1720 02/25/14 MULLINS,T    GFR calc non Af Amer NOT CALCULATED >90 mL/min   GFR calc Af Amer NOT CALCULATED >90 mL/min    Comment: (NOTE) The eGFR has been calculated using the CKD EPI equation. This calculation has not been validated in all clinical situations. eGFR's persistently <90 mL/min signify possible Chronic Kidney Disease.    Anion gap 14 5 - 15  CBC with Differential     Status: Abnormal   Collection Time: 02/25/14  3:42 PM  Result Value Ref Range   WBC 9.3 4.0 - 10.5 K/uL   RBC 4.05 (L) 4.22 - 5.81 MIL/uL   Hemoglobin 14.7 13.0 - 17.0 g/dL   HCT 42.7 39.0 - 52.0 %   MCV 105.4 (H) 78.0 - 100.0 fL   MCH 36.3 (H) 26.0 - 34.0 pg   MCHC 34.4 30.0 - 36.0 g/dL   RDW 15.5 11.5 - 15.5 %   Platelets 110 (L) 150 - 400 K/uL    Comment: RESULT REPEATED AND VERIFIED SPECIMEN CHECKED FOR CLOTS PLATELET COUNT CONFIRMED BY SMEAR    Neutrophils Relative % 82 (H) 43 - 77 %   Neutro Abs 7.7 1.7 - 7.7 K/uL   Lymphocytes Relative 10 (L) 12 - 46 %   Lymphs Abs 1.0 0.7 - 4.0 K/uL   Monocytes Relative 5 3 - 12 %   Monocytes Absolute 0.5 0.1 - 1.0 K/uL   Eosinophils Relative 1 0 - 5 %   Eosinophils Absolute 0.1 0.0 - 0.7 K/uL   Basophils Relative 1 0 - 1 %   Basophils Absolute 0.1 0.0 - 0.1 K/uL  Ethanol     Status: Abnormal    Collection Time: 02/25/14  3:42 PM  Result Value Ref Range   Alcohol, Ethyl (B) 392 (H) 0 - 9 mg/dL    Comment:        LOWEST DETECTABLE LIMIT FOR SERUM ALCOHOL IS 11 mg/dL FOR MEDICAL PURPOSES ONLY   Protime-INR  Status: Abnormal   Collection Time: 02/25/14  3:42 PM  Result Value Ref Range   Prothrombin Time 16.1 (H) 11.6 - 15.2 seconds   INR 1.28 0.00 - 1.49  Hepatitis panel, acute     Status: None   Collection Time: 02/25/14  3:42 PM  Result Value Ref Range   Hepatitis B Surface Ag NEGATIVE NEGATIVE   HCV Ab NEGATIVE NEGATIVE   Hep A IgM NON REACTIVE NON REACTIVE    Comment: (NOTE) Effective January 16, 2014, Hepatitis Acute Panel (test code [phone]) will be revised to automatically reflex to the Hepatitis C Viral RNA, Quantitative, Real-Time PCR assay if the Hepatitis C antibody screening result is Reactive. This action is being taken to ensure that the CDC/USPSTF recommended HCV diagnostic algorithm with the appropriate test reflex needed for accurate interpretation is followed.    Hep B C IgM NON REACTIVE NON REACTIVE    Comment: (NOTE) High levels of Hepatitis B Core IgM antibody are detectable during the acute stage of Hepatitis B. This antibody is used to differentiate current from past HBV infection. Performed at [facility]   Ammonia     Status: Abnormal   Collection Time: 02/25/14  4:33 PM  Result Value Ref Range   Ammonia 98 (H) 11 - 32 umol/L    Comment: Please note change in reference range.  Comprehensive metabolic panel     Status: Abnormal   Collection Time: 02/25/14 11:08 PM  Result Value Ref Range   Sodium 129 (L) 135 - 145 mmol/L    Comment: Please note change in reference range.   Potassium 2.4 (LL) 3.5 - 5.1 mmol/L    Comment: Please note change in reference range. CRITICAL RESULT CALLED TO, READ BACK BY AND VERIFIED WITH: SPOKE WITH AUTTERAOH,K RN 0011 [id number] COVINGTON,N     Chloride 89 (L) 96 - 112 mEq/L   CO2 28 19 - 32 mmol/L    Glucose, Bld 86 70 - 99 mg/dL   BUN 9 6 - 23 mg/dL   Creatinine, Ser 0.42 (L) 0.50 - 1.35 mg/dL   Calcium 7.5 (L) 8.4 - 10.5 mg/dL   Total Protein 6.2 6.0 - 8.3 g/dL   Albumin 2.3 (L) 3.5 - 5.2 g/dL   AST 313 (H) 0 - 37 U/L   ALT 103 (H) 0 - 53 U/L   Alkaline Phosphatase 371 (H) 39 - 117 U/L   Total Bilirubin 22.1 (HH) 0.3 - 1.2 mg/dL    Comment: CRITICAL RESULT CALLED TO, READ BACK BY AND VERIFIED WITH: SPOKE WITH AUTTERAOH,K RN 0011 [id number] COVINGTON,N DELTA CHECK NOTED    GFR calc non Af Amer >90 >90 mL/min   GFR calc Af Amer >90 >90 mL/min    Comment: (NOTE) The eGFR has been calculated using the CKD EPI equation. This calculation has not been validated in all clinical situations. eGFR's persistently <90 mL/min signify possible Chronic Kidney Disease.    Anion gap 12 5 - 15  Magnesium     Status: None   Collection Time: 02/25/14 11:08 PM  Result Value Ref Range   Magnesium 1.7 1.5 - 2.5 mg/dL  Phosphorus     Status: None   Collection Time: 02/25/14 11:08 PM  Result Value Ref Range   Phosphorus 2.5 2.3 - 4.6 mg/dL  CBC     Status: Abnormal   Collection Time: 02/25/14 11:08 PM  Result Value Ref Range   WBC 10.1 4.0 - 10.5 K/uL   RBC 3.58 (L) 4.22 -  5.81 MIL/uL   Hemoglobin 12.8 (L) 13.0 - 17.0 g/dL   HCT 37.6 (L) 39.0 - 52.0 %   MCV 105.0 (H) 78.0 - 100.0 fL   MCH 35.8 (H) 26.0 - 34.0 pg   MCHC 34.0 30.0 - 36.0 g/dL   RDW 15.9 (H) 11.5 - 15.5 %   Platelets 119 (L) 150 - 400 K/uL    Comment: CONSISTENT WITH PREVIOUS RESULT  Protime-INR     Status: Abnormal   Collection Time: 02/25/14 11:08 PM  Result Value Ref Range   Prothrombin Time 16.0 (H) 11.6 - 15.2 seconds   INR 1.27 0.00 - 1.49  Lipase, blood     Status: Abnormal   Collection Time: 02/25/14 11:08 PM  Result Value Ref Range   Lipase 136 (H) 11 - 59 U/L  Ammonia     Status: Abnormal   Collection Time: 02/25/14 11:08 PM  Result Value Ref Range   Ammonia 73 (H) 11 - 32 umol/L    Comment: Please note  change in reference range.  Magnesium     Status: None   Collection Time: 02/26/14 12:02 AM  Result Value Ref Range   Magnesium 2.0 1.5 - 2.5 mg/dL  Urine culture     Status: None   Collection Time: 02/26/14  1:10 AM  Result Value Ref Range   Specimen Description URINE, RANDOM    Special Requests NONE    Colony Count NO GROWTH Performed at [facility]     Culture NO GROWTH Performed at [facility]     Report Status 02/27/2014 FINAL   Urinalysis, Routine w reflex microscopic     Status: Abnormal   Collection Time: 02/26/14  1:10 AM  Result Value Ref Range   Color, Urine AMBER (A) YELLOW    Comment: BIOCHEMICALS MAY BE AFFECTED BY COLOR   APPearance CLOUDY (A) CLEAR   Specific Gravity, Urine 1.015 1.005 - 1.030   pH 6.0 5.0 - 8.0   Glucose, UA NEGATIVE NEGATIVE mg/dL   Hgb urine dipstick NEGATIVE NEGATIVE   Bilirubin Urine LARGE (A) NEGATIVE   Ketones, ur NEGATIVE NEGATIVE mg/dL   Protein, ur NEGATIVE NEGATIVE mg/dL   Urobilinogen, UA 4.0 (H) 0.0 - 1.0 mg/dL   Nitrite POSITIVE (A) NEGATIVE   Leukocytes, UA TRACE (A) NEGATIVE  Urine rapid drug screen (hosp performed)     Status: None   Collection Time: 02/26/14  1:10 AM  Result Value Ref Range   Opiates NONE DETECTED NONE DETECTED   Cocaine NONE DETECTED NONE DETECTED   Benzodiazepines NONE DETECTED NONE DETECTED   Amphetamines NONE DETECTED NONE DETECTED   Tetrahydrocannabinol NONE DETECTED NONE DETECTED   Barbiturates NONE DETECTED NONE DETECTED    Comment:        DRUG SCREEN FOR MEDICAL PURPOSES ONLY.  IF CONFIRMATION IS NEEDED FOR ANY PURPOSE, NOTIFY LAB WITHIN 5 DAYS.        LOWEST DETECTABLE LIMITS FOR URINE DRUG SCREEN Drug Class       Cutoff (ng/mL) Amphetamine      1000 Barbiturate      200 Benzodiazepine   191 Tricyclics       478 Opiates          300 Cocaine          300 THC              50   Urine microscopic-add on     Status: Abnormal   Collection Time: 02/26/14  1:10 AM  Result Value Ref Range   Squamous Epithelial / LPF FEW (A) RARE   WBC, UA 0-2 <3 WBC/hpf   Bacteria, UA FEW (A) RARE   Casts GRANULAR CAST (A) NEGATIVE  Lipase, blood     Status: Abnormal   Collection Time: 02/26/14  5:20 AM  Result Value Ref Range   Lipase 128 (H) 11 - 59 U/L  Ethanol     Status: Abnormal   Collection Time: 02/26/14  5:20 AM  Result Value Ref Range   Alcohol, Ethyl (B) 159 (H) 0 - 9 mg/dL    Comment:        LOWEST DETECTABLE LIMIT FOR SERUM ALCOHOL IS 11 mg/dL FOR MEDICAL PURPOSES ONLY   Magnesium     Status: None   Collection Time: 02/26/14  5:20 AM  Result Value Ref Range   Magnesium 1.5 1.5 - 2.5 mg/dL  TSH     Status: None   Collection Time: 02/26/14  5:20 AM  Result Value Ref Range   TSH 2.625 0.350 - 4.500 uIU/mL    Comment: Performed at [facility]  Comprehensive metabolic panel     Status: Abnormal   Collection Time: 02/26/14  5:20 AM  Result Value Ref Range   Sodium 129 (L) 135 - 145 mmol/L    Comment: Please note change in reference range.   Potassium 2.7 (LL) 3.5 - 5.1 mmol/L    Comment: Please note change in reference range. REPEATED TO VERIFY CRITICAL RESULT CALLED TO, READ BACK BY AND VERIFIED WITH: Lynn Ito [id number] @ 0613 BY J SCOTTON    Chloride 93 (L) 96 - 112 mEq/L   CO2 26 19 - 32 mmol/L   Glucose, Bld 84 70 - 99 mg/dL   BUN 8 6 - 23 mg/dL   Creatinine, Ser 0.31 (L) 0.50 - 1.35 mg/dL   Calcium 7.3 (L) 8.4 - 10.5 mg/dL   Total Protein 5.8 (L) 6.0 - 8.3 g/dL   Albumin 2.2 (L) 3.5 - 5.2 g/dL   AST 299 (H) 0 - 37 U/L   ALT 95 (H) 0 - 53 U/L   Alkaline Phosphatase 345 (H) 39 - 117 U/L   Total Bilirubin 21.6 (HH) 0.3 - 1.2 mg/dL    Comment: REPEATED TO VERIFY CRITICAL RESULT CALLED TO, READ BACK BY AND VERIFIED WITH: Lynn Ito [id number] @ 2297 BY J SCOTTON    GFR calc non Af Amer >90 >90 mL/min   GFR calc Af Amer >90 >90 mL/min    Comment: (NOTE) The eGFR has been calculated using the CKD EPI  equation. This calculation has not been validated in all clinical situations. eGFR's persistently <90 mL/min signify possible Chronic Kidney Disease.    Anion gap 10 5 - 15  CBC     Status: Abnormal   Collection Time: 02/26/14  5:20 AM  Result Value Ref Range   WBC 9.4 4.0 - 10.5 K/uL   RBC 3.40 (L) 4.22 - 5.81 MIL/uL   Hemoglobin 12.2 (L) 13.0 - 17.0 g/dL   HCT 35.8 (L) 39.0 - 52.0 %   MCV 105.3 (H) 78.0 - 100.0 fL   MCH 35.9 (H) 26.0 - 34.0 pg   MCHC 34.1 30.0 - 36.0 g/dL   RDW 16.1 (H) 11.5 - 15.5 %   Platelets 110 (L) 150 - 400 K/uL    Comment: CONSISTENT WITH PREVIOUS RESULT  Culture, blood (routine x 2)     Status: None (Preliminary result)   Collection Time: 02/26/14  5:36  PM  Result Value Ref Range   Specimen Description BLOOD RIGHT ARM    Special Requests BOTTLES DRAWN AEROBIC AND ANAEROBIC 10CC    Culture             BLOOD CULTURE RECEIVED NO GROWTH TO DATE CULTURE WILL BE HELD FOR 5 DAYS BEFORE ISSUING A FINAL NEGATIVE REPORT Performed at [facility]    Report Status PENDING   Culture, blood (routine x 2)     Status: None (Preliminary result)   Collection Time: 02/26/14  5:41 PM  Result Value Ref Range   Specimen Description BLOOD RIGHT HAND    Special Requests BOTTLES DRAWN AEROBIC AND ANAEROBIC 10CC    Culture             BLOOD CULTURE RECEIVED NO GROWTH TO DATE CULTURE WILL BE HELD FOR 5 DAYS BEFORE ISSUING A FINAL NEGATIVE REPORT Performed at [facility]    Report Status PENDING   Comprehensive metabolic panel     Status: Abnormal   Collection Time: 02/27/14  5:16 AM  Result Value Ref Range   Sodium 123 (L) 135 - 145 mmol/L    Comment: Please note change in reference range.   Potassium 2.7 (LL) 3.5 - 5.1 mmol/L    Comment: Please note change in reference range. CRITICAL RESULT CALLED TO, READ BACK BY AND VERIFIED WITH: K. LUTTERLOW RN AT 1287 ON 12.28.15 BY SHUEA    Chloride 92 (L) 96 - 112 mEq/L   CO2 26 19 - 32 mmol/L   Glucose,  Bld 120 (H) 70 - 99 mg/dL   BUN 9 6 - 23 mg/dL   Creatinine, Ser 0.38 (L) 0.50 - 1.35 mg/dL   Calcium 7.0 (L) 8.4 - 10.5 mg/dL   Total Protein 5.5 (L) 6.0 - 8.3 g/dL   Albumin 2.1 (L) 3.5 - 5.2 g/dL   AST 231 (H) 0 - 37 U/L   ALT 81 (H) 0 - 53 U/L   Alkaline Phosphatase 303 (H) 39 - 117 U/L   Total Bilirubin 24.0 (HH) 0.3 - 1.2 mg/dL    Comment: CRITICAL RESULT CALLED TO, READ BACK BY AND VERIFIED WITH: K. LUTTERLOW RN AT 8676 ON 12.28.15 BY SHUEA    GFR calc non Af Amer >90 >90 mL/min   GFR calc Af Amer >90 >90 mL/min    Comment: (NOTE) The eGFR has been calculated using the CKD EPI equation. This calculation has not been validated in all clinical situations. eGFR's persistently <90 mL/min signify possible Chronic Kidney Disease.    Anion gap 5 5 - 15  CBC with Differential     Status: Abnormal   Collection Time: 02/27/14  5:16 AM  Result Value Ref Range   WBC 7.9 4.0 - 10.5 K/uL   RBC 3.16 (L) 4.22 - 5.81 MIL/uL   Hemoglobin 11.5 (L) 13.0 - 17.0 g/dL   HCT 33.1 (L) 39.0 - 52.0 %   MCV 104.7 (H) 78.0 - 100.0 fL   MCH 36.4 (H) 26.0 - 34.0 pg   MCHC 34.7 30.0 - 36.0 g/dL   RDW 15.9 (H) 11.5 - 15.5 %   Platelets 95 (L) 150 - 400 K/uL    Comment: CONSISTENT WITH PREVIOUS RESULT   Neutrophils Relative % 82 (H) 43 - 77 %   Neutro Abs 6.5 1.7 - 7.7 K/uL   Lymphocytes Relative 8 (L) 12 - 46 %   Lymphs Abs 0.6 (L) 0.7 - 4.0 K/uL   Monocytes Relative 7 3 -  12 %   Monocytes Absolute 0.6 0.1 - 1.0 K/uL   Eosinophils Relative 2 0 - 5 %   Eosinophils Absolute 0.2 0.0 - 0.7 K/uL   Basophils Relative 1 0 - 1 %   Basophils Absolute 0.1 0.0 - 0.1 K/uL  Magnesium     Status: None   Collection Time: 02/27/14  5:16 AM  Result Value Ref Range   Magnesium 1.8 1.5 - 2.5 mg/dL  Ammonia     Status: Abnormal   Collection Time: 02/27/14 12:45 PM  Result Value Ref Range   Ammonia 105 (H) 11 - 32 umol/L    Comment: Please note change in reference range.  Comprehensive metabolic panel      Status: Abnormal   Collection Time: 02/28/14  4:40 AM  Result Value Ref Range   Sodium 129 (L) 135 - 145 mmol/L    Comment: Please note change in reference range.   Potassium 3.0 (L) 3.5 - 5.1 mmol/L    Comment: Please note change in reference range.   Chloride 99 96 - 112 mEq/L   CO2 24 19 - 32 mmol/L   Glucose, Bld 135 (H) 70 - 99 mg/dL   BUN 9 6 - 23 mg/dL   Creatinine, Ser <0.30 (L) 0.50 - 1.35 mg/dL   Calcium 7.0 (L) 8.4 - 10.5 mg/dL   Total Protein 5.2 (L) 6.0 - 8.3 g/dL   Albumin 1.9 (L) 3.5 - 5.2 g/dL   AST 145 (H) 0 - 37 U/L   ALT 68 (H) 0 - 53 U/L   Alkaline Phosphatase 276 (H) 39 - 117 U/L   Total Bilirubin 24.0 (HH) 0.3 - 1.2 mg/dL    Comment: REPEATED TO VERIFY CRITICAL VALUE NOTED.  VALUE IS CONSISTENT WITH PREVIOUSLY REPORTED AND CALLED VALUE.    GFR calc non Af Amer NOT CALCULATED >90 mL/min   GFR calc Af Amer NOT CALCULATED >90 mL/min    Comment: (NOTE) The eGFR has been calculated using the CKD EPI equation. This calculation has not been validated in all clinical situations. eGFR's persistently <90 mL/min signify possible Chronic Kidney Disease.    Anion gap 6 5 - 15   Labs are reviewed and are pertinent for medical issues being addressed.  Current Facility-Administered Medications  Medication Dose Route Frequency Provider Last Rate Last Dose  . 0.9 %  sodium chloride infusion   Intravenous Continuous Theodis Blaze, MD 100 mL/hr at 02/28/14 0803    . folic acid injection 1 mg  1 mg Intravenous Daily Theodis Blaze, MD   1 mg at 02/28/14 1046  . ibuprofen (ADVIL,MOTRIN) tablet 400 mg  400 mg Oral Q4H PRN Donne Hazel, MD   400 mg at 02/26/14 1515  . lactulose (CHRONULAC) 10 GM/15ML solution 30 g  30 g Oral Daily Donne Hazel, MD   30 g at 02/28/14 1000  . LORazepam (ATIVAN) tablet 1 mg  1 mg Oral Q6H PRN Dianne Dun, NP       Or  . LORazepam (ATIVAN) injection 1 mg  1 mg Intravenous Q6H PRN Dianne Dun, NP   1 mg at 02/26/14 1241   . morphine 2 MG/ML injection 1 mg  1 mg Intravenous Q4H PRN Theodis Blaze, MD   1 mg at 02/26/14 1043  . multivitamin with minerals tablet 1 tablet  1 tablet Oral Daily Theodis Blaze, MD   1 tablet at 02/28/14 1047  . ondansetron (ZOFRAN) tablet 4 mg  4 mg Oral Q6H PRN Theodis Blaze, MD       Or  . ondansetron [facility]) injection 4 mg  4 mg Intravenous Q6H PRN Theodis Blaze, MD      . pantoprazole (PROTONIX) injection 40 mg  40 mg Intravenous Q24H Theodis Blaze, MD   40 mg at 02/27/14 1820  . piperacillin-tazobactam (ZOSYN) IVPB 3.375 g  3.375 g Intravenous Q8H Angela Adam, RPH   3.375 g at 02/28/14 1054  . potassium chloride SA (K-DUR,KLOR-CON) CR tablet 40 mEq  40 mEq Oral BID Donne Hazel, MD   40 mEq at 02/28/14 1047  . prednisoLONE tablet 40 mg  40 mg Oral Daily Donne Hazel, MD   40 mg at 02/28/14 1047  . sodium chloride 0.9 % injection 3 mL  3 mL Intravenous Q12H Theodis Blaze, MD   3 mL at 02/27/14 2249  . thiamine (B-1) injection 100 mg  100 mg Intravenous Daily Theodis Blaze, MD   100 mg at 02/28/14 1047  . vancomycin (VANCOCIN) 1,250 mg in sodium chloride 0.9 % 250 mL IVPB  1,250 mg Intravenous Q12H Clovis Riley, RPH   1,250 mg at 02/28/14 0518    Psychiatric Specialty Exam:     Blood pressure 118/81, pulse 120, temperature 98.3 F (36.8 C), temperature source Oral, resp. rate 19, height 5' 9"  (1.753 m), weight 204 lb 9.4 oz (92.8 kg), SpO2 100 %.Body mass index is 30.2 kg/(m^2).  General Appearance: Disheveled  Eye Contact::  Good  Speech:  Slow  Volume:  Normal  Mood:  Irritable  Affect:  Congruent  Thought Process:  Coherent  Orientation:  Full (Time, Place, and Person)  Thought Content:  WDL  Suicidal Thoughts:  No  Homicidal Thoughts:  No  Memory:  Immediate;   Fair Recent;   Fair Remote;   Fair  Judgement:  Impaired  Insight:  Lacking  Psychomotor Activity:  Decreased  Concentration:  Fair  Recall:  [place] of Knowledge:Good  Language:  Good  Akathisia:  No  Handed:  Right  AIMS (if indicated):     Assets:  Financial Resources/Insurance Housing Intimacy Leisure Time Resilience Social Support Transportation  Sleep:      Musculoskeletal: Strength & Muscle Tone: within normal limits Gait & Station: normal Patient leans: N/A  Treatment Plan Summary: Supportive therapy provided about ongoing stressors.  Return tomorrow with AA resources and to encourage him to get help for his alcohol issues.  Dr. Sabra Heck reviewed the patient and concurs with the plan.  Waylan Boga, [place] 02/28/2014 12:10 PM  I have been consulted about this patient and agree with the assessment and plan Geralyn Flash A. [email or web address].D.

## 2014-02-28 NOTE — Progress Notes (Addendum)
[facility] PROGRESS NOTE  Gary [name] HAF:[id number] DOB: 24-Aug-1950 DOA: 02/25/2014 PCP: No primary care provider on file.  Assessment/Plan: 1. Acute alcoholic hepatitis 1. LFT's continuing to slowly trend down 2. abd [place] without biliary obstruction 3. Cont supportive care 4. Avoid hepatotoxic agents 5. Calculated DF (Discriminant Function) of 42 (greater than 32), thus have initiated systemic steroids for severe alcoholic hepatitis, treatment for 28 days, then taper afterwards 6. If no significant improvement despite steroids or if pt worsens, would consult GI 2. Acute encephalopathy 1. Likely secondary to hepatic encephalopathy 3. ETOH abuse 1. Cont on CIWA protocol 2. Seems stable thus far with fewer tremors today 3. Have consulted Psychiatry for etoh abuse. Pt is willing to attend program for alcohol abuse 4. Hypokalemia 1. Correct as needed 5. Hypomagnesemia  1. Cont to follow and replace 2. Likely related to ETOH abuse 6. Hyponatremia 1. Cont to follow 2. Likely from etoh abuse 7. Thrombocytopenia 1. Trending down - less than 100k this AM 2. Monitor 8. DVT prophylaixs 1. SCD's  Code Status: Full Family Communication: Pt in room, family at bedside Disposition Plan: Pending  Consultants:  Psychiatry  Procedures:    Antibiotics:    HPI/Subjective: Pt is without complains. No acute events noted overnight  Objective: Filed Vitals:   02/28/14 0000 02/28/14 0600 02/28/14 0700 02/28/14 1406  BP: 112/75 118/81 118/81 115/76  Pulse: 69 120 120 75  Temp:   98.3 F (36.8 C) 98.3 F (36.8 C)  TempSrc:   Oral Oral  Resp:   19 20  Height:      Weight:   92.8 kg (204 lb 9.4 oz)   SpO2:   100% 100%    Intake/Output Summary (Last 24 hours) at 02/28/14 1716 Last data filed at 02/28/14 1500  Gross per 24 hour  Intake 3883.33 ml  Output   1275 ml  Net 2608.33 ml   Filed Weights   02/26/14 0530 02/27/14 0704 02/28/14 0700  Weight: 89.5 kg (197 lb 5  oz) 92.9 kg (204 lb 12.9 oz) 92.8 kg (204 lb 9.4 oz)    Exam:   General:  Awake, in nad  Cardiovascular: regular, s1, s2  Respiratory: normal resp effort, no wheezing  Abdomen: soft,nondistended  Musculoskeletal: perfused, no clubbing   Data Reviewed: Basic Metabolic Panel:  Recent Labs Lab 02/25/14 1542 02/25/14 2308 02/26/14 0002 02/26/14 0520 02/27/14 0516 02/28/14 0440  NA 131* 129*  --  129* 123* 129*  [name] 2.0* 2.4*  --  2.7* 2.7* 3.0*  CL 86* 89*  --  93* 92* 99  CO2 31 28  --  26 26 24   GLUCOSE 95 86  --  84 120* 135*  BUN 9 9  --  8 9 9   CREATININE <0.30* 0.42*  --  0.31* 0.38* <0.30*  CALCIUM 8.3* 7.5*  --  7.3* 7.0* 7.0*  MG  --  1.7 2.0 1.5 1.8  --   PHOS  --  2.5  --   --   --   --    Liver Function Tests:  Recent Labs Lab 02/25/14 1542 02/25/14 2308 02/26/14 0520 02/27/14 0516 02/28/14 0440  AST 360* 313* 299* 231* 145*  ALT 120* 103* 95* 81* 68*  ALKPHOS 437* 371* 345* 303* 276*  BILITOT 26.3* 22.1* 21.6* 24.0* 24.0*  PROT 7.2 6.2 5.8* 5.5* 5.2*  ALBUMIN 2.6* 2.3* 2.2* 2.1* 1.9*    Recent Labs Lab 02/25/14 2308 02/26/14 0520  LIPASE 136* 128*  Recent Labs Lab 02/25/14 1633 02/25/14 2308 02/27/14 1245  AMMONIA 98* 73* 105*   CBC:  Recent Labs Lab 02/25/14 1542 02/25/14 2308 02/26/14 0520 02/27/14 0516  WBC 9.3 10.1 9.4 7.9  NEUTROABS 7.7  --   --  6.5  HGB 14.7 12.8* 12.2* 11.5*  HCT 42.7 37.6* 35.8* 33.1*  MCV 105.4* 105.0* 105.3* 104.7*  PLT 110* 119* 110* 95*   Cardiac Enzymes: No results for input(s): CKTOTAL, CKMB, CKMBINDEX, TROPONINI in the last 168 hours. BNP (last 3 results) No results for input(s): PROBNP in the last 8760 hours. CBG: No results for input(s): GLUCAP in the last 168 hours.  Recent Results (from the past 240 hour(s))  Urine culture     Status: None   Collection Time: 02/26/14  1:10 AM  Result Value Ref Range Status   Specimen Description URINE, RANDOM  Final   Special Requests NONE   Final   Colony Count NO GROWTH Performed at [facility]   Final   Culture NO GROWTH Performed at [facility]   Final   Report Status 02/27/2014 FINAL  Final  Culture, blood (routine x 2)     Status: None (Preliminary result)   Collection Time: 02/26/14  5:36 PM  Result Value Ref Range Status   Specimen Description BLOOD RIGHT ARM  Final   Special Requests BOTTLES DRAWN AEROBIC AND ANAEROBIC 10CC  Final   Culture   Final           BLOOD CULTURE RECEIVED NO GROWTH TO DATE CULTURE WILL BE HELD FOR 5 DAYS BEFORE ISSUING A FINAL NEGATIVE REPORT Performed at [facility]    Report Status PENDING  Incomplete  Culture, blood (routine x 2)     Status: None (Preliminary result)   Collection Time: 02/26/14  5:41 PM  Result Value Ref Range Status   Specimen Description BLOOD RIGHT HAND  Final   Special Requests BOTTLES DRAWN AEROBIC AND ANAEROBIC 10CC  Final   Culture   Final           BLOOD CULTURE RECEIVED NO GROWTH TO DATE CULTURE WILL BE HELD FOR 5 DAYS BEFORE ISSUING A FINAL NEGATIVE REPORT Performed at [facility]    Report Status PENDING  Incomplete     Studies: Dg Chest Port 1 View  02/26/2014   CLINICAL DATA:  Fever and weakness  EXAM: PORTABLE CHEST - 1 VIEW  COMPARISON:  02/25/2014  FINDINGS: Cardiac shadow is stable. The right hemidiaphragm is again elevated. Old rib fractures are again seen on the right. The lungs are clear bilaterally.  IMPRESSION: No active disease.   Electronically Signed   By: Alcide Clever M.D.   On: 02/26/2014 18:47    Scheduled Meds: . feeding supplement (ENSURE COMPLETE)  237 mL Oral Q24H  . folic acid  1 mg Intravenous Daily  . lactulose  30 g Oral Daily  . multivitamin with minerals  1 tablet Oral Daily  . pantoprazole (PROTONIX) IV  40 mg Intravenous Q24H  . piperacillin-tazobactam (ZOSYN)  IV  3.375 g Intravenous Q8H  . potassium chloride  40 mEq Oral BID  . prednisoLONE  40 mg Oral Daily  . sodium chloride   3 mL Intravenous Q12H  . thiamine  100 mg Intravenous Daily  . vancomycin  1,250 mg Intravenous Q12H   Continuous Infusions: . sodium chloride 100 mL/hr at 02/28/14 1610    Active Problems:   Hepatitis   Acute hepatitis   Alcohol  dependence with withdrawal with complication  Time spent: 30min  Gary [name]  [facility] Pager [phone]. If 7PM-7AM, please contact night-coverage at [email or web address], password [email or web address] 02/28/2014, 5:16 PM  LOS: 3 days

## 2014-02-28 NOTE — Progress Notes (Addendum)
INITIAL NUTRITION ASSESSMENT  DOCUMENTATION CODES Per approved criteria  -Not Applicable   INTERVENTION: -Recommend Ensure Complete once daily, each supplement provides 350 kcal, 13 gram protein -Encouraged general healthy nutrition w/use of snacks or supplements during illness -Continue with multivitamin -RD to continue to monitor  NUTRITION DIAGNOSIS: Inadequate oral intake related to ETOH abuse/encephalopathy as evidenced by PO intake < 75%   Goal: Pt to meet >/= 90% of their estimated nutrition needs    Monitor:  Total protein/energy intake, labs,weights  Reason for Assessment: MST  63 y.o. male  Admitting Dx: <principal problem not specified>  ASSESSMENT: Pt is 63 yo male with history of alcohol abuse, presented [facility] ED via EMS after neighbor noted pt appeared intoxicated and with yellow skin.  -Pt reported decreased intake pta; diet recall indicates pt consuming one meal daily, usually at dinner time. H&P noted that pt drinks 10-12 beers daily; which likely contributing to sub-optimal nutrition intake -Denied any unintentional wt loss; previous medical records indicate non-significant 6 lb weight loss over past 5 months -Current PO intake minimal, 10% of meals. -Pt reported tolerating crackers w/peanut butter, applesauce and some soup -Was willing to trial Ensure once daily -LFT trending down -Ammonia remains elevated; receiving lactulose -Acute encephalopathy improving -K low; being repleted -Elevated glucose likely related to medications   Height: Ht Readings from Last 1 Encounters:  02/25/14 5\' 9"  (1.753 m)    Weight: Wt Readings from Last 1 Encounters:  02/28/14 204 lb 9.4 oz (92.8 kg)    Ideal Body Weight: 160 lb  % Ideal Body Weight: 128%  Wt Readings from Last 10 Encounters:  02/28/14 204 lb 9.4 oz (92.8 kg)  09/23/13 210 lb (95.255 kg)    Usual Body Weight: ~205-210 lb per med records  % Usual Body Weight: 100%  BMI:  Body mass index is  30.2 kg/(m^2).  Estimated Nutritional Needs: Kcal: 1850-2050 Protein: 60-75 gram, 90-100 gram when acute encephalopathy improves Fluid: >/= 1900 ml daily  Skin: WDL, jaundice  Diet Order: Diet regular  EDUCATION NEEDS: -No education needs identified at this time   Intake/Output Summary (Last 24 hours) at 02/28/14 1452 Last data filed at 02/28/14 1354  Gross per 24 hour  Intake 3203.33 ml  Output   1275 ml  Net 1928.33 ml    Last BM: 12/28   Labs:   Recent Labs Lab 02/25/14 2308 02/26/14 0002 02/26/14 0520 02/27/14 0516 02/28/14 0440  NA 129*  --  129* 123* 129*  K 2.4*  --  2.7* 2.7* 3.0*  CL 89*  --  93* 92* 99  CO2 28  --  26 26 24   BUN 9  --  8 9 9   CREATININE 0.42*  --  0.31* 0.38* <0.30*  CALCIUM 7.5*  --  7.3* 7.0* 7.0*  MG 1.7 2.0 1.5 1.8  --   PHOS 2.5  --   --   --   --   GLUCOSE 86  --  84 120* 135*    CBG (last 3)  No results for input(s): GLUCAP in the last 72 hours.  Scheduled Meds: . folic acid  1 mg Intravenous Daily  . lactulose  30 g Oral Daily  . multivitamin with minerals  1 tablet Oral Daily  . pantoprazole (PROTONIX) IV  40 mg Intravenous Q24H  . piperacillin-tazobactam (ZOSYN)  IV  3.375 g Intravenous Q8H  . potassium chloride  40 mEq Oral BID  . prednisoLONE  40 mg Oral Daily  .  sodium chloride  3 mL Intravenous Q12H  . thiamine  100 mg Intravenous Daily  . vancomycin  1,250 mg Intravenous Q12H    Continuous Infusions: . sodium chloride 100 mL/hr at 02/28/14 [id number]    Past Medical History  Diagnosis Date  . Hypertension     History reviewed. No pertinent past surgical history.  Lloyd HugerSarah F Natania Finigan MS RD LDN Clinical Dietitian Pager:[phone]

## 2014-03-01 DIAGNOSIS — D696 Thrombocytopenia, unspecified: Secondary | ICD-10-CM | POA: Diagnosis present

## 2014-03-01 DIAGNOSIS — D539 Nutritional anemia, unspecified: Secondary | ICD-10-CM | POA: Diagnosis present

## 2014-03-01 DIAGNOSIS — D649 Anemia, unspecified: Secondary | ICD-10-CM

## 2014-03-01 HISTORY — DX: Other disorders of bilirubin metabolism: E80.6

## 2014-03-01 LAB — COMPREHENSIVE METABOLIC PANEL
ALT: 65 U/L — ABNORMAL HIGH (ref 0–53)
AST: 127 U/L — ABNORMAL HIGH (ref 0–37)
Albumin: 1.8 g/dL — ABNORMAL LOW (ref 3.5–5.2)
Alkaline Phosphatase: 260 U/L — ABNORMAL HIGH (ref 39–117)
Anion gap: 5 (ref 5–15)
BUN: 11 mg/dL (ref 6–23)
CALCIUM: 6.8 mg/dL — AB (ref 8.4–10.5)
CO2: 24 mmol/L (ref 19–32)
CREATININE: 0.37 mg/dL — AB (ref 0.50–1.35)
Chloride: 103 mEq/L (ref 96–112)
Glucose, Bld: 94 mg/dL (ref 70–99)
Potassium: 3.2 mmol/L — ABNORMAL LOW (ref 3.5–5.1)
Sodium: 132 mmol/L — ABNORMAL LOW (ref 135–145)
Total Bilirubin: 21.8 mg/dL (ref 0.3–1.2)
Total Protein: 4.9 g/dL — ABNORMAL LOW (ref 6.0–8.3)

## 2014-03-01 LAB — CBC
HCT: 33.4 % — ABNORMAL LOW (ref 39.0–52.0)
Hemoglobin: 11.4 g/dL — ABNORMAL LOW (ref 13.0–17.0)
MCH: 36.4 pg — AB (ref 26.0–34.0)
MCHC: 34.1 g/dL (ref 30.0–36.0)
MCV: 106.7 fL — ABNORMAL HIGH (ref 78.0–100.0)
PLATELETS: 121 10*3/uL — AB (ref 150–400)
RBC: 3.13 MIL/uL — AB (ref 4.22–5.81)
RDW: 17.1 % — AB (ref 11.5–15.5)
WBC: 9.5 10*3/uL (ref 4.0–10.5)

## 2014-03-01 LAB — MAGNESIUM: Magnesium: 2 mg/dL (ref 1.5–2.5)

## 2014-03-01 LAB — AMMONIA: AMMONIA: 94 umol/L — AB (ref 11–32)

## 2014-03-01 MED ORDER — VITAMIN B-1 100 MG PO TABS
100.0000 mg | ORAL_TABLET | Freq: Every day | ORAL | Status: DC
  Administered 2014-03-01 – 2014-03-02 (×2): 100 mg via ORAL
  Filled 2014-03-01 (×2): qty 1

## 2014-03-01 MED ORDER — POTASSIUM CHLORIDE CRYS ER 20 MEQ PO TBCR
40.0000 meq | EXTENDED_RELEASE_TABLET | ORAL | Status: AC
  Administered 2014-03-01 (×2): 40 meq via ORAL
  Filled 2014-03-01 (×2): qty 2

## 2014-03-01 MED ORDER — FOLIC ACID 1 MG PO TABS
1.0000 mg | ORAL_TABLET | Freq: Every day | ORAL | Status: DC
  Administered 2014-03-01: 1 mg via ORAL
  Filled 2014-03-01 (×2): qty 1

## 2014-03-01 MED ORDER — PANTOPRAZOLE SODIUM 40 MG PO TBEC
40.0000 mg | DELAYED_RELEASE_TABLET | Freq: Every day | ORAL | Status: DC
  Administered 2014-03-02 – 2014-03-04 (×3): 40 mg via ORAL
  Filled 2014-03-01 (×5): qty 1

## 2014-03-01 NOTE — Progress Notes (Signed)
Clinical Social Work Department CLINICAL SOCIAL WORK PSYCHIATRY SERVICE LINE ASSESSMENT 03/01/2014  Patient:  KIRBY CORTESE  Account:  [id number]  Admit Date:  02/25/2014  Clinical Social Worker:  Sindy Messing, LCSW  Date/Time:  03/01/2014 04:00 PM Referred by:  Physician  Date referred:  03/01/2014 Reason for Referral  Psychosocial assessment   Presenting Symptoms/Problems (In the person's/family's own words):   Psych consulted due to substance abuse.   Abuse/Neglect/Trauma History (check all that apply)  Denies history   Abuse/Neglect/Trauma Comments:   Psychiatric History (check all that apply)  Inpatient/hospitilization   Psychiatric medications:  Ativan 1 mg   Current Mental Health Hospitalizations/Previous Mental Health History:   Patient denies any MH diagnosis.   Current provider:   None currently   Place and Date:   N/A   Current Medications:   Scheduled Meds:      . feeding supplement (ENSURE COMPLETE)  237 mL Oral Q24H  . folic acid  1 mg Intravenous Daily  . lactulose  30 g Oral Daily  . multivitamin with minerals  1 tablet Oral Daily  . pantoprazole (PROTONIX) IV  40 mg Intravenous Q24H  . piperacillin-tazobactam (ZOSYN)  IV  3.375 g Intravenous Q8H  . prednisoLONE  40 mg Oral Daily  . sodium chloride  3 mL Intravenous Q12H  . thiamine  100 mg Intravenous Daily  . vancomycin  1,250 mg Intravenous Q12H        Continuous Infusions:      . sodium chloride 100 mL/hr at 03/01/14 0711          PRN Meds:.ibuprofen, morphine injection, ondansetron **OR** ondansetron (ZOFRAN) IV       Previous Impatient Admission/Date/Reason:   Patient reports he went to [facility] about 3-4 years ago.   Emotional Health / Current Symptoms    Suicide/Self Harm  None reported   Suicide attempt in the past:   Patient denies any SI or HI.   Other harmful behavior:   None reported   Psychotic/Dissociative Symptoms  None reported   Other Psychotic/Dissociative Symptoms:     Attention/Behavioral Symptoms  Within Normal Limits   Other Attention / Behavioral Symptoms:   Patient engaged during assessment.    Cognitive Impairment  Within Normal Limits   Other Cognitive Impairment:   Patient alert and oriented.    Mood and Adjustment  Flat    Stress, Anxiety, Trauma, Any Recent Loss/Stressor  Relationship   Anxiety (frequency):   N/A   Phobia (specify):   N/A   Compulsive behavior (specify):   N/A   Obsessive behavior (specify):   N/A   Other:   Patient reports his wife is in [place] and that she is having a difficult time getting visa to return to [place].   Substance Abuse/Use  Current substance use   SBIRT completed (please refer for detailed history):  Y  Self-reported substance use:   Patient reports that he drinks on a daily basis. Patient's wife went to [place] in October and patient reports his drinking increased since then. Patient admits to daily drinking. Patient reports no active treatment but is interested in rehab options to ensure sobriety.   Urinary Drug Screen Completed:  Y Alcohol level:   159    Environmental/Housing/Living Arrangement  Stable housing   Who is in the home:   Alone   Emergency contact:  Public house manager   Patient's Strengths and Goals (patient's own words):   Patient has supportive friend and family.  Clinical Social Worker's Interpretive Summary:   CSW received referral in order to complete psychosocial assessment. CSW reviewed chart and met with patient at bedside. CSW introduced myself and explained role. Patient's friend and sister at bedside. Patient agreeable to visitors being involved in discussion.    Patient reports he lives at home alone. Patient is married but wife has gone back to [place] to visit family and is now having a difficult time returning to the country because of visa problems. Patient reports he started drinking more heavily after wife left.     Patient admits to drinking daily. Patient completed SBIRT and reports that MD has explained that patient's health will decline significantly if he continues to drink. Patient's family spoke with patient about DC plans and patient reports he wants to continue to with [place] meetings. CSW spoke with patient about residential treatment, intensive outpatient program (IOP), and outpatient treatment. Patient wants to consider options but is open to treatment.    CSW will continue to follow and will assist with DC planning.   Disposition:  Recommend Psych CSW continuing to support while in hospital   [place], [place] [phone]

## 2014-03-01 NOTE — Progress Notes (Signed)
[facility] PROGRESS NOTE  Gary [name] ZOX:[id number] DOB: 1950-10-24 DOA: 02/25/2014 PCP: No primary care provider on file.  Assessment/Plan: #1 acute alcoholic hepatitis Patient noted to have LFTs in the 221 pattern. LFT slowly trending down. Abdominal ultrasound without any biliary obstruction however patient does have a significantly elevated bilirubin. Acute hepatitis panel was negative. Patient's calculated discriminant function is 42 and thus patient has been started on systemic steroids for severe alcoholic hepatitis treatment for 4 weeks and then a taper afterwards. Avoid hepatotoxic agents. Continue supportive care. Follow.  #2 acute encephalopathy Secondary to hepatic encephalopathy and intoxication as patient was noted to have elevated ammonia level. Ammonia levels have trended down and currently at 94. Patient is alert oriented to self and place and time. Continue lactulose daily. Follow.  #3 hyperbilirubinemia May be secondary to problem #1. Abdominal ultrasound is negative. Bilirubin level is still elevated at 21.8. Abdominal ultrasound was negative for any biliary obstruction. Patient is significantly jaundiced however denies any abdominal pain. Will consult with GI for further evaluation and management.??? MRCP. Follow.  #4 hypokalemia/hypomagnesemia Likely secondary to problem #1. Replete. Keep magnesium level greater than 2.  #5 macrocytic anemia Likely secondary to problem chronic alcohol abuse. Check an anemia panel. Continue folic acid and thiamine.  #6 alcohol abuse Patient still with tremors. Continue the Ativan CIWA protocol. Psychiatry has been consulted for further evaluation for his alcohol abuse.  #7 hyponatremia Likely secondary to alcohol abuse. Improving.  #8 thrombocytopenia Likely secondary to alcohol abuse. Slowly improving. Follow.  #9 prophylaxis PPI for GI prophylaxis. SCDs for DVT prophylaxis.  Code Status: Full Family Communication:  Updated patient, sister, neighbor. Disposition Plan: Home when medically stable.   Consultants:  None  Procedures:  Abdominal ultrasound 02/25/2014  Chest x-ray 02/25/2014, 02/26/2014  X-ray of the left forearm 02/25/2014  X-ray of the wrist 02/25/2014  Antibiotics:  IV vancomycin 02/26/2014>>>>> 03/01/2014  IV Zosyn 02/26/2014>>>>> 03/01/2014  HPI/Subjective: Patient alert and oriented to self place and time. Patient states he's feeling better than on admission. Patient denies any shortness of breath. No chest pain. Patient with some tremors.  Objective: Filed Vitals:   03/01/14 1357  BP: 128/77  Pulse: 82  Temp: 98.3 F (36.8 C)  Resp: 18    Intake/Output Summary (Last 24 hours) at 03/01/14 1527 Last data filed at 03/01/14 1500  Gross per 24 hour  Intake 2516.67 ml  Output   2000 ml  Net 516.67 ml   Filed Weights   02/27/14 0704 02/28/14 0700 03/01/14 0518  Weight: 92.9 kg (204 lb 12.9 oz) 92.8 kg (204 lb 9.4 oz) 93 kg (205 lb 0.4 oz)    Exam:   General:  Jaundiced. Tremors noted.  Cardiovascular: Regular rate rhythm no murmurs rubs or gallops.  Respiratory: Clear to auscultation bilaterally.  Abdomen: Soft, nontender, nondistended, positive bowel sounds.  Musculoskeletal: No clubbing cyanosis or edema.  Data Reviewed: Basic Metabolic Panel:  Recent Labs Lab 02/25/14 2308 02/26/14 0002 02/26/14 0520 02/27/14 0516 02/28/14 0440 03/01/14 0635 03/01/14 0645  NA 129*  --  129* 123* 129* 132*  --   K 2.4*  --  2.7* 2.7* 3.0* 3.2*  --   CL 89*  --  93* 92* 99 103  --   CO2 28  --  26 26 24 24   --   GLUCOSE 86  --  84 120* 135* 94  --   BUN 9  --  8 9 9 11   --   CREATININE  0.42*  --  0.31* 0.38* <0.30* 0.37*  --   CALCIUM 7.5*  --  7.3* 7.0* 7.0* 6.8*  --   MG 1.7 2.0 1.5 1.8  --   --  2.0  PHOS 2.5  --   --   --   --   --   --    Liver Function Tests:  Recent Labs Lab 02/25/14 2308 02/26/14 0520 02/27/14 0516 02/28/14 0440  03/01/14 0635  AST 313* 299* 231* 145* 127*  ALT 103* 95* 81* 68* 65*  ALKPHOS 371* 345* 303* 276* 260*  BILITOT 22.1* 21.6* 24.0* 24.0* 21.8*  PROT 6.2 5.8* 5.5* 5.2* 4.9*  ALBUMIN 2.3* 2.2* 2.1* 1.9* 1.8*    Recent Labs Lab 02/25/14 2308 02/26/14 0520  LIPASE 136* 128*    Recent Labs Lab 02/25/14 1633 02/25/14 2308 02/27/14 1245 03/01/14 0635  AMMONIA 98* 73* 105* 94*   CBC:  Recent Labs Lab 02/25/14 1542 02/25/14 2308 02/26/14 0520 02/27/14 0516 03/01/14 0635  WBC 9.3 10.1 9.4 7.9 9.5  NEUTROABS 7.7  --   --  6.5  --   HGB 14.7 12.8* 12.2* 11.5* 11.4*  HCT 42.7 37.6* 35.8* 33.1* 33.4*  MCV 105.4* 105.0* 105.3* 104.7* 106.7*  PLT 110* 119* 110* 95* 121*   Cardiac Enzymes: No results for input(s): CKTOTAL, CKMB, CKMBINDEX, TROPONINI in the last 168 hours. BNP (last 3 results) No results for input(s): PROBNP in the last 8760 hours. CBG: No results for input(s): GLUCAP in the last 168 hours.  Recent Results (from the past 240 hour(s))  Urine culture     Status: None   Collection Time: 02/26/14  1:10 AM  Result Value Ref Range Status   Specimen Description URINE, RANDOM  Final   Special Requests NONE  Final   Colony Count NO GROWTH Performed at [facility]   Final   Culture NO GROWTH Performed at [facility]   Final   Report Status 02/27/2014 FINAL  Final  Culture, blood (routine x 2)     Status: None (Preliminary result)   Collection Time: 02/26/14  5:36 PM  Result Value Ref Range Status   Specimen Description BLOOD RIGHT ARM  Final   Special Requests BOTTLES DRAWN AEROBIC AND ANAEROBIC 10CC  Final   Culture   Final           BLOOD CULTURE RECEIVED NO GROWTH TO DATE CULTURE WILL BE HELD FOR 5 DAYS BEFORE ISSUING A FINAL NEGATIVE REPORT Performed at [facility]    Report Status PENDING  Incomplete  Culture, blood (routine x 2)     Status: None (Preliminary result)   Collection Time: 02/26/14  5:41 PM  Result Value Ref  Range Status   Specimen Description BLOOD RIGHT HAND  Final   Special Requests BOTTLES DRAWN AEROBIC AND ANAEROBIC 10CC  Final   Culture   Final           BLOOD CULTURE RECEIVED NO GROWTH TO DATE CULTURE WILL BE HELD FOR 5 DAYS BEFORE ISSUING A FINAL NEGATIVE REPORT Performed at [facility]    Report Status PENDING  Incomplete     Studies: No results found.  Scheduled Meds: . feeding supplement (ENSURE COMPLETE)  237 mL Oral Q24H  . folic acid  1 mg Intravenous Daily  . lactulose  30 g Oral Daily  . multivitamin with minerals  1 tablet Oral Daily  . pantoprazole (PROTONIX) IV  40 mg Intravenous Q24H  . piperacillin-tazobactam (  ZOSYN)  IV  3.375 g Intravenous Q8H  . prednisoLONE  40 mg Oral Daily  . sodium chloride  3 mL Intravenous Q12H  . thiamine  100 mg Intravenous Daily  . vancomycin  1,250 mg Intravenous Q12H   Continuous Infusions: . sodium chloride 100 mL/hr at 03/01/14 1610    Principal Problem:   Acute hepatitis Active Problems:   Hyperbilirubinemia   Hepatitis   Alcohol dependence with withdrawal with complication   Anemia   Thrombocytopenia    Time spent: 35 minutes    THOMPSON,DANIEL M.D. [facility] Pager [phone]. If 7PM-7AM, please contact night-coverage at [email or web address], password [facility] 03/01/2014, 3:27 PM  LOS: 4 days

## 2014-03-01 NOTE — Consult Note (Signed)
Consultation  Referring Provider:  [facility]    Primary Care Physician:  No primary care provider on file. Primary Gastroenterologist: none        Reason for Consultation:  Elevated LFTs            HPI:   Gary [name] is a 63 y.o. male with history of ETOH abuse, ETOH 159 on admission.  Patient brought to ED by EMS after neighbors noticed he was jaundiced. Started on lactulose for encephalopathy. Started on steroids yesterday for presumed ETOH hepatitis and discriminant function of 42.    Sister from [place] is here, helps provide history. Patient has a long history of ETOH abuse (beer). No FMH of liver disease. He has lost weight according to sister (patient denies). No abdominal pain. No blood in stool or other GI complaints.     Past Medical History  Diagnosis Date  . Hypertension     Family History  Problem Relation Age of Onset  . Heart failure Father      History  Substance Use Topics  . Smoking status: Never Smoker   . Smokeless tobacco: Never Used  . Alcohol Use: 4.8 oz/week    7 Cans of beer, 1 Shots of liquor per week    Prior to Admission medications   Medication Sig Start Date End Date Taking? Authorizing Provider  Multiple Vitamin (MULTIVITAMIN WITH MINERALS) TABS tablet Take 1 tablet by mouth daily.   Yes Historical Provider, MD  Omega-3 Fatty Acids (FISH OIL PO) Take 1 capsule by mouth daily.   Yes Historical Provider, MD  traMADol (ULTRAM) 50 MG tablet Take 1 tablet (50 mg total) by mouth every 12 (twelve) hours as needed. 09/23/13  Yes Toy CookeyMegan Docherty, MD  allopurinol (ZYLOPRIM) 100 MG tablet Take 100-200 mg by mouth daily. Take 100mg  for 15 days then increase to 200mg  for 15 days.    Historical Provider, MD  colchicine 0.6 MG tablet Take 0.6 mg by mouth 2 (two) times daily.    Historical Provider, MD  lisinopril (PRINIVIL,ZESTRIL) 20 MG tablet Take 20 mg by mouth daily.    Historical Provider, MD    Current Facility-Administered Medications    Medication Dose Route Frequency Provider Last Rate Last Dose  . 0.9 %  sodium chloride infusion   Intravenous Continuous Dorothea OgleIskra M Myers, MD 100 mL/hr at 03/01/14 16100711    . feeding supplement (ENSURE COMPLETE) (ENSURE COMPLETE) liquid 237 mL  237 mL Oral Q24H Sharyne RichtersSarah Frances Beaty, RD   237 mL at 03/01/14 1308  . folic acid injection 1 mg  1 mg Intravenous Daily Dorothea OgleIskra M Myers, MD   1 mg at 03/01/14 1024  . ibuprofen (ADVIL,MOTRIN) tablet 400 mg  400 mg Oral Q4H PRN Jerald KiefStephen K Chiu, MD   400 mg at 02/26/14 1515  . lactulose (CHRONULAC) 10 GM/15ML solution 30 g  30 g Oral Daily Jerald KiefStephen K Chiu, MD   30 g at 02/28/14 1000  . morphine 2 MG/ML injection 1 mg  1 mg Intravenous Q4H PRN Dorothea OgleIskra M Myers, MD   1 mg at 02/26/14 1043  . multivitamin with minerals tablet 1 tablet  1 tablet Oral Daily Dorothea OgleIskra M Myers, MD   1 tablet at 03/01/14 1021  . ondansetron (ZOFRAN) tablet 4 mg  4 mg Oral Q6H PRN Dorothea OgleIskra M Myers, MD       Or  . ondansetron [facility](ZOFRAN) injection 4 mg  4 mg Intravenous Q6H PRN Dorothea OgleIskra M Myers, MD      .  pantoprazole (PROTONIX) injection 40 mg  40 mg Intravenous Q24H Dorothea Ogle, MD   40 mg at 02/28/14 2015  . piperacillin-tazobactam (ZOSYN) IVPB 3.375 g  3.375 g Intravenous Q8H Maurice March, RPH   3.375 g at 03/01/14 1020  . prednisoLONE tablet 40 mg  40 mg Oral Daily Jerald Kief, MD   40 mg at 03/01/14 1021  . sodium chloride 0.9 % injection 3 mL  3 mL Intravenous Q12H Dorothea Ogle, MD   3 mL at 03/01/14 1024  . thiamine (B-1) injection 100 mg  100 mg Intravenous Daily Dorothea Ogle, MD   100 mg at 03/01/14 1020  . vancomycin (VANCOCIN) 1,250 mg in sodium chloride 0.9 % 250 mL IVPB  1,250 mg Intravenous Q12H Dannielle Huh, RPH   1,250 mg at 03/01/14 0530    Allergies as of 02/25/2014  . (No Known Allergies)    Review of Systems:    All systems reviewed and negative except where noted in HPI.    Physical Exam:  Vital signs in last 24 hours: Temp:  [97.7 F (36.5 C)-99.1 F  (37.3 C)] 98.3 F (36.8 C) (12/30 1357) Pulse Rate:  [72-98] 82 (12/30 1357) Resp:  [18-20] 18 (12/30 1357) BP: (116-164)/(77-102) 128/77 mmHg (12/30 1357) SpO2:  [96 %-99 %] 97 % (12/30 1357) Weight:  [205 lb 0.4 oz (93 kg)] 205 lb 0.4 oz (93 kg) (12/30 0518) Last BM Date: 02/28/14 General:   Pleasant white male in NAD Head:  Normocephalic and atraumatic. Eyes:   Icteric sclera. Ears:  Normal auditory acuity. Neck:  Supple; no masses felt Lungs:  Respirations even and unlabored. Lungs clear to auscultation bilaterally.   No wheezes, crackles, or rhonchi.  Heart:  Regular rate and rhythm;  murmur heard. Abdomen:  Soft, nondistended, nontender. Normal bowel sounds. No appreciable masses or hepatomegaly.  Rectal:  Not performed.  Msk:  Symmetrical without gross deformities.  Extremities:  Without edema. Neurologic:  Alert and  oriented x4;  Very mild asterixis. Skin:  Intact without significant lesions or rashes. Cervical Nodes:  No significant cervical adenopathy. Psych:  Alert and cooperative. Normal affect.  LAB RESULTS:  Recent Labs  02/27/14 0516 03/01/14 0635  WBC 7.9 9.5  HGB 11.5* 11.4*  HCT 33.1* 33.4*  PLT 95* 121*   BMET  Recent Labs  02/27/14 0516 02/28/14 0440 03/01/14 0635  NA 123* 129* 132*  K 2.7* 3.0* 3.2*  CL 92* 99 103  CO2 26 24 24   GLUCOSE 120* 135* 94  BUN 9 9 11   CREATININE 0.38* <0.30* 0.37*  CALCIUM 7.0* 7.0* 6.8*   LFT  Recent Labs  03/01/14 0635  PROT 4.9*  ALBUMIN 1.8*  AST 127*  ALT 65*  ALKPHOS 260*  BILITOT 21.8*     PREVIOUS ENDOSCOPIES:            none   Impression / Plan:   63 year old male with history of ETOH abuse admitted with suspected ETOH hepatitis. No stigmata of chronic liver disease on exam. Prednisolone started yesterday for discriminant function of 42. Acute hepatitis panel negative. Ultrasound reveals gallbladder sludge,  Enlarged liver. Platelets low but probably BM suppression from ETOH.    Basically, patient needs to stop drinking,  I told him this.  Will continue steroids for presumed ETOH hepatitis.   CIWA protocol ongoing.   Nutritional supplements, he hasn't been eating.   Agree with lactulose   Thanks   LOS: 4 days  Willette Cluster  03/01/2014, 5:12 PM    [facility] Attending  I have also seen and assessed the patient and agree with the above note.  He has severe alcoholic hepatitis. Appropriate Tx underway. DC to home when taking po a bit better and also when sure detoxed. Will f/u US after dc - will obtain appt for f/u tomorrow - aim for 2 weeks from now  Will follow.  Iva Boop, MD, [facility] [phone] (pager) 03/01/2014 7:33 PM

## 2014-03-01 NOTE — Care Management Note (Signed)
    Page 1 of 1   03/01/2014     3:24:05 PM CARE MANAGEMENT NOTE 03/01/2014  Patient:  Gary [name], Gary [name]   Account Number:  [id number]  Date Initiated:  03/01/2014  Documentation initiated by:  Trinna Balloon  Subjective/Objective Assessment:   Pt admitted with cco altered mental status with yellow skin     Action/Plan:   from home   Anticipated DC Date:  03/04/2014   Anticipated DC Plan:  HOME/SELF CARE      DC Planning Services  CM consult  PCP issues      Choice offered to / List presented to:             Status of service:  In process, will continue to follow Medicare Important Message given?   (If response is "NO", the following Medicare IM given date fields will be blank) Date Medicare IM given:   Medicare IM given by:   Date Additional Medicare IM given:   Additional Medicare IM given by:    Discharge Disposition:    Per UR Regulation:  Reviewed for med. necessity/level of care/duration of stay  If discussed at Long Length of Stay Meetings, dates discussed:    Comments:  03-01-14 Lorenda Ishihara RN CM [phone] Spoke with patient and family at bedside. Provided patient with information on how to obtain a PCP. Reviewed information with patient. He plans to look over them and make a decision. Will f/u as needed.  02/25/14 MMcGibboney, RN, BSN Pt  needs PT evaluation for disposition recommendations. Will continue to follow.

## 2014-03-01 NOTE — Progress Notes (Signed)
CARE MANAGEMENT NOTE 03/01/2014  Patient:  Gary [name], Gary [name]   Account Number:  [id number]  Date Initiated:  03/01/2014  Documentation initiated by:  Trinna Balloon  Subjective/Objective Assessment:   Pt admitted with cco altered mental status with yellow skin     Action/Plan:   from home   Anticipated DC Date:  03/04/2014   Anticipated DC Plan:  HOME/SELF CARE      DC Planning Services  CM consult      Choice offered to / List presented to:             Status of service:  In process, will continue to follow Medicare Important Message given?   (If response is "NO", the following Medicare IM given date fields will be blank) Date Medicare IM given:   Medicare IM given by:   Date Additional Medicare IM given:   Additional Medicare IM given by:    Discharge Disposition:    Per UR Regulation:  Reviewed for med. necessity/level of care/duration of stay  If discussed at Long Length of Stay Meetings, dates discussed:    Comments:  02/25/14 MMcGibboney, RN, BSN Pt  needs PT evaluation for disposition recommendations. Will continue to follow.

## 2014-03-02 ENCOUNTER — Encounter: Payer: Self-pay | Admitting: Nurse Practitioner

## 2014-03-02 LAB — CBC
HEMATOCRIT: 34.7 % — AB (ref 39.0–52.0)
HEMOGLOBIN: 11.7 g/dL — AB (ref 13.0–17.0)
MCH: 36.4 pg — ABNORMAL HIGH (ref 26.0–34.0)
MCHC: 33.7 g/dL (ref 30.0–36.0)
MCV: 108.1 fL — ABNORMAL HIGH (ref 78.0–100.0)
Platelets: 122 10*3/uL — ABNORMAL LOW (ref 150–400)
RBC: 3.21 MIL/uL — AB (ref 4.22–5.81)
RDW: 16.9 % — ABNORMAL HIGH (ref 11.5–15.5)
WBC: 9.6 10*3/uL (ref 4.0–10.5)

## 2014-03-02 LAB — VITAMIN B12: Vitamin B-12: >2000 pg/mL — ABNORMAL HIGH (ref 211–911)

## 2014-03-02 LAB — COMPREHENSIVE METABOLIC PANEL
ALBUMIN: 1.8 g/dL — AB (ref 3.5–5.2)
ALK PHOS: 261 U/L — AB (ref 39–117)
ALT: 73 U/L — ABNORMAL HIGH (ref 0–53)
ANION GAP: 8 (ref 5–15)
AST: 131 U/L — ABNORMAL HIGH (ref 0–37)
BUN: 12 mg/dL (ref 6–23)
CALCIUM: 7.3 mg/dL — AB (ref 8.4–10.5)
CO2: 22 mmol/L (ref 19–32)
CREATININE: 0.34 mg/dL — AB (ref 0.50–1.35)
Chloride: 105 mEq/L (ref 96–112)
GFR calc Af Amer: >90 mL/min (ref >90)
GFR calc non Af Amer: >90 mL/min (ref >90)
Glucose, Bld: 97 mg/dL (ref 70–99)
Potassium: 3.5 mmol/L (ref 3.5–5.1)
Sodium: 135 mmol/L (ref 135–145)
TOTAL PROTEIN: 5.3 g/dL — AB (ref 6.0–8.3)
Total Bilirubin: 20.7 mg/dL (ref 0.3–1.2)

## 2014-03-02 LAB — IRON AND TIBC
Iron: 111 ug/dL (ref 42–165)
SATURATION RATIOS: 85 % — AB (ref 20–55)
TIBC: 130 ug/dL — AB (ref 215–435)
UIBC: 19 ug/dL — AB (ref 125–400)

## 2014-03-02 LAB — FOLATE: Folate: 11 ng/mL

## 2014-03-02 LAB — FERRITIN: FERRITIN: 7766 ng/mL — AB (ref 22–322)

## 2014-03-02 MED ORDER — THIAMINE HCL 100 MG/ML IJ SOLN
100.0000 mg | Freq: Every day | INTRAMUSCULAR | Status: DC
  Filled 2014-03-02 (×2): qty 1

## 2014-03-02 MED ORDER — ADULT MULTIVITAMIN W/MINERALS CH
1.0000 | ORAL_TABLET | Freq: Every day | ORAL | Status: DC
  Administered 2014-03-03 – 2014-03-04 (×2): 1 via ORAL
  Filled 2014-03-02 (×2): qty 1

## 2014-03-02 MED ORDER — VITAMIN B-1 100 MG PO TABS
100.0000 mg | ORAL_TABLET | Freq: Every day | ORAL | Status: DC
  Administered 2014-03-03 – 2014-03-04 (×2): 100 mg via ORAL
  Filled 2014-03-02 (×2): qty 1

## 2014-03-02 MED ORDER — LORAZEPAM 2 MG/ML IJ SOLN: 1.0000 mg | Freq: Four times a day (QID) | INTRAMUSCULAR | Status: DC | PRN

## 2014-03-02 MED ORDER — ENSURE COMPLETE PO LIQD
237.0000 mL | Freq: Two times a day (BID) | ORAL | Status: DC
  Administered 2014-03-02 – 2014-03-04 (×5): 237 mL via ORAL

## 2014-03-02 MED ORDER — LORAZEPAM 1 MG PO TABS: 1.0000 mg | ORAL_TABLET | Freq: Four times a day (QID) | ORAL | Status: DC | PRN

## 2014-03-02 MED ORDER — ENSURE COMPLETE PO LIQD
237.0000 mL | Freq: Two times a day (BID) | ORAL | Status: DC
  Administered 2014-03-02: 237 mL via ORAL

## 2014-03-02 MED ORDER — POTASSIUM CHLORIDE CRYS ER 20 MEQ PO TBCR
40.0000 meq | EXTENDED_RELEASE_TABLET | Freq: Once | ORAL | Status: AC
  Administered 2014-03-02: 40 meq via ORAL
  Filled 2014-03-02: qty 2

## 2014-03-02 NOTE — Evaluation (Signed)
Occupational Therapy Evaluation Patient Details Name: Gary [name] MRN: [id number] DOB: 08/26/1950 Today's Date: 03/02/2014    History of Present Illness 63 yo male admitted with acute hepatitis, ETOH. ETOH abuse.    Clinical Impression   Pt fatigues easily with activity and needs mod cues for safety with walker and functional tasks. Pt currently requires 24/7 assist for safety. Will follow on acute.    Follow Up Recommendations  SNF;Supervision/Assistance - 24 hour    Equipment Recommendations  None recommended by OT    Recommendations for Other Services       Precautions / Restrictions Precautions Precautions: Fall Restrictions Weight Bearing Restrictions: No      Mobility Bed Mobility Overal bed mobility: Needs Assistance Bed Mobility: Supine to Sit     Supine to sit: Min guard Sit to supine: Min guard      Transfers Overall transfer level: Needs assistance Equipment used: Rolling walker (2 wheeled) Transfers: Sit to/from Stand Sit to Stand: Min guard         General transfer comment: verbal cues for safety    Balance Overall balance assessment: Needs assistance         Standing balance support: Bilateral upper extremity supported;During functional activity Standing balance-Leahy Scale: Poor                              ADL Overall ADL's : Needs assistance/impaired Eating/Feeding: Independent;Bed level   Grooming: Wash/dry hands;Set up;Sitting   Upper Body Bathing: Set up;Sitting   Lower Body Bathing: Minimal assistance;Sit to/from stand   Upper Body Dressing : Set up;Sitting   Lower Body Dressing: Minimal assistance;Sit to/from stand   Toilet Transfer: Min guard;Minimal assistance;Ambulation;Comfort height toilet;Grab bars;RW; mod verbal cues   Toileting- Clothing Manipulation and Hygiene: Min guard;Sitting/lateral lean         General ADL Comments: Pt fatigues easily with activity and pt reports he does feel fatigued  with just getting up to the toilet and back to bed. Note pt is shaky in [place] during activity. Pt tends to push the walker out too far in front of him. Cues to stay closer to the walker for safety. He tends to have a forward flexed posture in standing. Pt reporting soreness with wiping after BM and note blood on washcloth. Informed nursing and OT applied barrier cream. Pt appearing to be a little frustrated with OT asking about PLOF and how he feels he is performing this session. He states, "I am weak from being in the bed."      Vision                     Perception     Praxis      Pertinent Vitals/Pain Pain Assessment: No/denies pain     Hand Dominance     Extremity/Trunk Assessment Upper Extremity Assessment Upper Extremity Assessment: Generalized weakness (note pt shaky with using UEs)      Cervical / Trunk Assessment Cervical / Trunk Assessment: Kyphotic   Communication Communication Communication: No difficulties   Cognition Arousal/Alertness: Awake/alert Behavior During Therapy: WFL for tasks assessed/performed Overall Cognitive Status: Within Functional Limits for tasks assessed                     General Comments       Exercises       Shoulder Instructions      Home Living Family/patient expects to be discharged  to:: Private residence Living Arrangements: Alone   Type of Home:  (condo) Home Access: Stairs to enter Enter[employer] CorporationEntrance Stairs-Number of Steps: 1   Home Layout: One level     Bathroom Shower/Tub: Walk-in shower         Home Equipment: Gilmer MorCane - single point   Additional Comments: pt didnt offer much information to OT regarding ADL. Per chart, pt's house smelling of alcohol and vomit.       Prior Functioning/Environment Level of Independence: Independent with assistive device(s)        Comments: was using cane for ambulation    OT Diagnosis: Generalized weakness   OT Problem List: Decreased strength;Decreased knowledge of  use of DME or AE;Decreased activity tolerance   OT Treatment/Interventions: Self-care/ADL training;Patient/family education;Therapeutic activities;DME and/or AE instruction    OT Goals(Current goals can be found in the care plan section) Acute Rehab OT Goals Patient Stated Goal: none stated OT Goal Formulation: With patient Time For Goal Achievement: 03/16/14 Potential to Achieve Goals: Good  OT Frequency: Min 2X/week   Barriers to D/C:            Co-evaluation              End of Session Equipment Utilized During Treatment: Gait belt;Rolling walker  Activity Tolerance: Patient limited by fatigue Patient left: in bed;with call bell/phone within reach;with family/visitor present   Time: 1200-1220 OT Time Calculation (min): 20 min Charges:  OT General Charges $OT Visit: 1 Procedure OT Evaluation $Initial OT Evaluation Tier I: 1 Procedure OT Treatments $Therapeutic Activity: 8-22 mins G-Codes:    Lennox LaityStone, Shavelle Runkel [place]  [phone] 03/02/2014, 12:38 PM

## 2014-03-02 NOTE — Progress Notes (Signed)
Patient has gained 3.8kg in weight since being weighed yesterday.  PCP on call was notified.

## 2014-03-02 NOTE — Progress Notes (Signed)
Clinical Social Work  CSW met with patient and sister at bedside. Patient reports he has been weak and has decided that he needs SNF prior to returning home. SNF search initiated but patient's insurance status is a barrier. CSW spoke with Ria Comment at [facility] who reports that patient's [employer] will end today. Patient reports he has signed up for [employer] but has not received any cards or policy numbers for insurance. CSW spoke with [facility] representative [facility]) who reports she is unable to verify policy number until tomorrow but their company is closed for the holiday. CSW spoke with admitting who reports they will look into insurance and call CSW if they are able to verify a policy number. CSW faxed out information stating that patient has [facility] insurance but no policy number. CSW left a message with assistant director Nathaniel Man) to determine if patient could be considered for a letter of guarantee due to insurance barriers.   CSW spoke with patient about SA treatment after SNF stay. Patient reports he is considering a residential treatment stay but is unsure what his insurance will cover and will need to look at his finances. CSW provided information with [facility] and [facility] who both report they are in network with [facility] insurance. CSW also explained intensive outpatient program (IOP) and explained that [facility] have IOP that he could participate in. Patient plans to follow up with AA meetings at a church close to home as well.   CSW will continue to follow.  [place], [place] [phone]

## 2014-03-02 NOTE — Progress Notes (Signed)
[facility] PROGRESS NOTE  Gary [name] ZOX:[id number] DOB: Jan 19, 1951 DOA: 02/25/2014 PCP: No primary care provider on file.  Assessment/Plan: #1 acute severe alcoholic hepatitis Patient noted to have LFTs in the 2:1 pattern. LFT slowly trending down. Abdominal ultrasound without any biliary obstruction however patient does have a significantly elevated bilirubin. Acute hepatitis panel was negative. Patient's calculated discriminant function is 42 and thus patient has been started on systemic steroids for severe alcoholic hepatitis treatment for 4 weeks and then a taper afterwards. Avoid hepatotoxic agents. Continue supportive care.  GI following and agree with current management. Outpatient follow-up with GI has been arranged. Patient needs to quit alcohol and understands this. Psych social work helping with alcohol rehabilitation. Follow.  #2 acute encephalopathy Secondary to hepatic encephalopathy and intoxication as patient was noted to have elevated ammonia level. Ammonia levels have trended down and currently at 94. Patient is alert oriented to self and place and time. Continue lactulose daily. Follow.  #3 hyperbilirubinemia May be secondary to problem #1. Abdominal ultrasound is negative. Bilirubin level is still elevated at 20.7. Abdominal ultrasound was negative for any biliary obstruction. Patient is significantly jaundiced however denies any abdominal pain. GI following and I agree with current management. Outpatient follow-up.   #4 hypokalemia/hypomagnesemia Likely secondary to problem #1. Replete. Keep magnesium level greater than 2.  #5 macrocytic anemia Likely secondary to problem chronic alcohol abuse. Folic acid elevated. D/C folate. Continue  thiamine.  #6 alcohol abuse Patient still with tremors. Continue the Ativan CIWA protocol. Psychiatry has been consulted for further evaluation for his alcohol abuse.  #7 hyponatremia Likely secondary to alcohol abuse.  Improving.  #8 thrombocytopenia Likely secondary to alcohol abuse. Slowly improving. Follow.  #9 prophylaxis PPI for GI prophylaxis. SCDs for DVT prophylaxis.  Code Status: Full Family Communication: Updated patient, sister. Disposition Plan: SNF when bed available   Consultants:  Gastroenterology : Dr Leone Payor 03/01/14  Procedures:  Abdominal ultrasound 02/25/2014  Chest x-ray 02/25/2014, 02/26/2014  X-ray of the left forearm 02/25/2014  X-ray of the wrist 02/25/2014  Antibiotics:  IV vancomycin 02/26/2014>>>>> 03/01/2014  IV Zosyn 02/26/2014>>>>> 03/01/2014  HPI/Subjective: Patient alert and oriented to self place and time. Patient states he's feeling better than on admission. Patient denies any shortness of breath. No chest pain. Patient with some tremors.  Objective: Filed Vitals:   03/02/14 1301  BP: 130/83  Pulse: 98  Temp: 98.1 F (36.7 C)  Resp: 16    Intake/Output Summary (Last 24 hours) at 03/02/14 1517 Last data filed at 03/02/14 1200  Gross per 24 hour  Intake 1554.58 ml  Output    350 ml  Net 1204.58 ml   Filed Weights   02/28/14 0700 03/01/14 0518 03/02/14 0605  Weight: 92.8 kg (204 lb 9.4 oz) 93 kg (205 lb 0.4 oz) 96.8 kg (213 lb 6.5 oz)    Exam:   General:  Jaundiced. Tremors noted, burt improved.  Cardiovascular: Regular rate rhythm no murmurs rubs or gallops.  Respiratory: Clear to auscultation bilaterally.  Abdomen: Soft, nontender, nondistended, positive bowel sounds.  Musculoskeletal: No clubbing cyanosis or edema.  Data Reviewed: Basic Metabolic Panel:  Recent Labs Lab 02/25/14 2308 02/26/14 0002 02/26/14 0520 02/27/14 0516 02/28/14 0440 03/01/14 0635 03/01/14 0645 03/02/14 0424  NA 129*  --  129* 123* 129* 132*  --  135  K 2.4*  --  2.7* 2.7* 3.0* 3.2*  --  3.5  CL 89*  --  93* 92* 99 103  --  105  CO2 28  --  26 26 24 24   --  22  GLUCOSE 86  --  84 120* 135* 94  --  97  BUN 9  --  8 9 9 11   --  12   CREATININE 0.42*  --  0.31* 0.38* <0.30* 0.37*  --  0.34*  CALCIUM 7.5*  --  7.3* 7.0* 7.0* 6.8*  --  7.3*  MG 1.7 2.0 1.5 1.8  --   --  2.0  --   PHOS 2.5  --   --   --   --   --   --   --    Liver Function Tests:  Recent Labs Lab 02/26/14 0520 02/27/14 0516 02/28/14 0440 03/01/14 0635 03/02/14 0424  AST 299* 231* 145* 127* 131*  ALT 95* 81* 68* 65* 73*  ALKPHOS 345* 303* 276* 260* 261*  BILITOT 21.6* 24.0* 24.0* 21.8* 20.7*  PROT 5.8* 5.5* 5.2* 4.9* 5.3*  ALBUMIN 2.2* 2.1* 1.9* 1.8* 1.8*    Recent Labs Lab 02/25/14 2308 02/26/14 0520  LIPASE 136* 128*    Recent Labs Lab 02/25/14 1633 02/25/14 2308 02/27/14 1245 03/01/14 0635  AMMONIA 98* 73* 105* 94*   CBC:  Recent Labs Lab 02/25/14 1542 02/25/14 2308 02/26/14 0520 02/27/14 0516 03/01/14 0635 03/02/14 0424  WBC 9.3 10.1 9.4 7.9 9.5 9.6  NEUTROABS 7.7  --   --  6.5  --   --   HGB 14.7 12.8* 12.2* 11.5* 11.4* 11.7*  HCT 42.7 37.6* 35.8* 33.1* 33.4* 34.7*  MCV 105.4* 105.0* 105.3* 104.7* 106.7* 108.1*  PLT 110* 119* 110* 95* 121* 122*   Cardiac Enzymes: No results for input(s): CKTOTAL, CKMB, CKMBINDEX, TROPONINI in the last 168 hours. BNP (last 3 results) No results for input(s): PROBNP in the last 8760 hours. CBG: No results for input(s): GLUCAP in the last 168 hours.  Recent Results (from the past 240 hour(s))  Urine culture     Status: None   Collection Time: 02/26/14  1:10 AM  Result Value Ref Range Status   Specimen Description URINE, RANDOM  Final   Special Requests NONE  Final   Colony Count NO GROWTH Performed at [facility]   Final   Culture NO GROWTH Performed at [facility]   Final   Report Status 02/27/2014 FINAL  Final  Culture, blood (routine x 2)     Status: None (Preliminary result)   Collection Time: 02/26/14  5:36 PM  Result Value Ref Range Status   Specimen Description BLOOD RIGHT ARM  Final   Special Requests BOTTLES DRAWN AEROBIC AND ANAEROBIC 10CC   Final   Culture   Final           BLOOD CULTURE RECEIVED NO GROWTH TO DATE CULTURE WILL BE HELD FOR 5 DAYS BEFORE ISSUING A FINAL NEGATIVE REPORT Performed at [facility]    Report Status PENDING  Incomplete  Culture, blood (routine x 2)     Status: None (Preliminary result)   Collection Time: 02/26/14  5:41 PM  Result Value Ref Range Status   Specimen Description BLOOD RIGHT HAND  Final   Special Requests BOTTLES DRAWN AEROBIC AND ANAEROBIC 10CC  Final   Culture   Final           BLOOD CULTURE RECEIVED NO GROWTH TO DATE CULTURE WILL BE HELD FOR 5 DAYS BEFORE ISSUING A FINAL NEGATIVE REPORT Performed at [facility]    Report Status PENDING  Incomplete  Studies: No results found.  Scheduled Meds: . feeding supplement (ENSURE COMPLETE)  237 mL Oral BID BM  . lactulose  30 g Oral Daily  . multivitamin with minerals  1 tablet Oral Daily  . multivitamin with minerals  1 tablet Oral Daily  . pantoprazole  40 mg Oral Q0600  . prednisoLONE  40 mg Oral Daily  . sodium chloride  3 mL Intravenous Q12H  . thiamine  100 mg Oral Daily   Or  . thiamine  100 mg Intravenous Daily  . thiamine  100 mg Oral Daily   Continuous Infusions:    Principal Problem:   Alcoholic hepatitis without ascites Active Problems:   Hyperbilirubinemia   Acute hepatitis   Alcohol dependence with withdrawal with complication   Anemia   Thrombocytopenia    Time spent: 35 minutes    Siboney Requejo M.D. [facility] Pager [phone]. If 7PM-7AM, please contact night-coverage at [email or web address], password [email or web address] 03/02/2014, 3:17 PM  LOS: 5 days

## 2014-03-02 NOTE — Evaluation (Signed)
Physical Therapy Evaluation Patient Details Name: Gary [name] MRN: [id number] DOB: Aug 15, 1950 Today's Date: 03/02/2014   History of Present Illness  63 yo male admitted with acute hepatitis, ETOH. ETOH abuse.   Clinical Impression  On eval, pt required Min guard assist for mobility-able to walk ~75 feet with RW. Demonstrates general weakness, decreased activity tolerance, and impaired gait and balance. Recommend ST rehab at SNF unless family can arrange supervision/assist at home. Do not feel pt could manage at home alone at this time.     Follow Up Recommendations SNF;Supervision/Assistance - 24 hour (unless family can arrange supervison/assist for home)    Equipment Recommendations  Rolling walker with 5" wheels    Recommendations for Other Services OT consult     Precautions / Restrictions Precautions Precautions: Fall Restrictions Weight Bearing Restrictions: No      Mobility  Bed Mobility Overal bed mobility: Needs Assistance Bed Mobility: Supine to Sit;Sit to Supine     Supine to sit: Min guard Sit to supine: Min guard      Transfers Overall transfer level: Needs assistance Equipment used: Rolling walker (2 wheeled) Transfers: Sit to/from Stand Sit to Stand: From elevated surface;Min guard         General transfer comment: VCS safety, hand placement. close guard for safety  Ambulation/Gait Ambulation/Gait assistance: Min guard Ambulation Distance (Feet): 75 Feet Assistive device: Rolling walker (2 wheeled) Gait Pattern/deviations: Decreased stride length;Trunk flexed;Decreased step length - left;Decreased step length - right;Step-through pattern     General Gait Details: close guard for safety. fatigues easily. VCs safety  Stairs            Wheelchair Mobility    Modified Rankin (Stroke Patients Only)       Balance Overall balance assessment: Needs assistance         Standing balance support: Bilateral upper extremity  supported;During functional activity Standing balance-Leahy Scale: Poor                               Pertinent Vitals/Pain Pain Assessment: No/denies pain    Home Living Family/patient expects to be discharged to:: Private residence Living Arrangements: Alone   Type of Home:  (condo) Home Access: Stairs to enter   [employer] of Steps: 1 Home Layout: One level Home Equipment: Cane - single point      Prior Function Level of Independence: Independent with assistive device(s)         Comments: was using cane for ambulation     Hand Dominance        Extremity/Trunk Assessment   Upper Extremity Assessment: Defer to OT evaluation           Lower Extremity Assessment: Generalized weakness      Cervical / Trunk Assessment: Kyphotic  Communication   Communication: No difficulties  Cognition Arousal/Alertness: Awake/alert Behavior During Therapy: WFL for tasks assessed/performed Overall Cognitive Status: Within Functional Limits for tasks assessed                      General Comments      Exercises        Assessment/Plan    PT Assessment Patient needs continued PT services  PT Diagnosis Difficulty walking;Generalized weakness   PT Problem List Decreased range of motion;Decreased activity tolerance;Decreased balance;Decreased mobility;Decreased knowledge of use of DME;Decreased strength  PT Treatment Interventions DME instruction;Gait training;Functional mobility training;Therapeutic activities;Therapeutic exercise;Patient/family education;Balance training   PT  Goals (Current goals can be found in the Care Plan section) Acute Rehab PT Goals Patient Stated Goal: to get better. home PT Goal Formulation: With patient Time For Goal Achievement: 03/16/14 Potential to Achieve Goals: Good    Frequency Min 3X/week   Barriers to discharge        Co-evaluation               End of Session Equipment Utilized During  Treatment: Gait belt Activity Tolerance: Patient limited by fatigue Patient left: in bed;with call bell/phone within reach;with bed alarm set;with family/visitor present           Time: [id number] PT Time Calculation (min) (ACUTE ONLY): 12 min   Charges:   PT Evaluation $Initial PT Evaluation Tier I: 1 Procedure PT Treatments $Gait Training: 8-22 mins   PT G Codes:        Rebeca AlertJannie Trudee Chirino, MPT Pager: [phone]

## 2014-03-02 NOTE — Progress Notes (Signed)
CRITICAL VALUE ALERT  Critical value received: Total bilirubin 20.7  Date of notification:  03/02/14  Time of notification: 0559  Critical value read back:yes  Nurse who received alert:  Karie Schwalbe  MD notified (1st page): yes  Time of first page:  [phone]

## 2014-03-02 NOTE — Progress Notes (Addendum)
Clinical Social Work Department CLINICAL SOCIAL WORK PLACEMENT NOTE 03/02/2014  Patient:  Gary [name], Gary [name]  Account Number:  [id number] Admit date:  02/25/2014  Clinical Social Worker:  Unk Lightning, LCSW  Date/time:  03/02/2014 03:00 PM  Clinical Social Work is seeking post-discharge placement for this patient at the following level of care:   SKILLED NURSING   (*CSW will update this form in Epic as items are completed)   03/02/2014  Patient/family provided with [facility] Department of Clinical Social Work's list of facilities offering this level of care within the geographic area requested by the patient (or if unable, by the patient's family).  03/02/2014  Patient/family informed of their freedom to choose among providers that offer the needed level of care, that participate in Medicare, Medicaid or managed care program needed by the patient, have an available bed and are willing to accept the patient.  03/02/2014  Patient/family informed of [facility]' ownership interest in [facility], as well as of the fact that they are under no obligation to receive care at this facility.  PASARR submitted to EDS on 03/02/2014 PASARR number received on 03/02/2014  FL2 transmitted to all facilities in geographic area requested by pt/family on  03/02/2014 FL2 transmitted to all facilities within larger geographic area on   Patient informed that his/her managed care company has contracts with or will negotiate with  certain facilities, including the following:     Patient/family informed of bed offers received:  03/03/14 Patient chooses bed at [facility] Physician recommends and patient chooses bed at    Patient to be transferred to [facility] on  03/04/13 Patient to be transferred to facility by [facility] Patient and family notified of transfer on 03/04/13 Name of family member notified:  Sister  The following physician request were entered in Epic:   Additional Comments:

## 2014-03-02 NOTE — Progress Notes (Signed)
NUTRITION FOLLOW UP  Intervention:   -Increased Ensure Complete to BID -Encouraged intake of meals, snack, and supplements to assist in recovery and healing -Discouraged continued use of ETOH or other substances -RD to continue to monitor  Nutrition Dx:   Inadequate oral intake related to ETOH abuse/encephalopathy as evidenced by PO intake < 75%; improving  Goal:   Pt to meet >/= 90% of their estimated nutrition needs    Monitor:   Total protein/energy intake, labs, weights, supplement acceptance  Assessment:   12/29: -Pt reported decreased intake pta; diet recall indicates pt consuming one meal daily, usually at dinner time. H&P noted that pt drinks 10-12 beers daily; which likely contributing to sub-optimal nutrition intake -Denied any unintentional wt loss; previous medical records indicate non-significant 6 lb weight loss over past 5 months -Current PO intake minimal, 10% of meals. -Pt reported tolerating crackers w/peanut butter, applesauce and some soup -Was willing to trial Ensure once daily -LFT trending down -Ammonia remains elevated; receiving lactulose -Acute encephalopathy improving -K low; being repleted -Elevated glucose likely related to medications  12/31: -Pt reported improvement in appetite, pt consuming > 75% of breakfast this morning (eggs, grits, bacon) Denied nausea or abd pain post meals -Pt drinking Ensure, 100% completion. Was willing to increase to BID until appetite fully improves and pt recovers -Pt's neighbor in room; confirmed decreased appetite for past 2 weeks d/t ETOH abuse. Pt mentation improved on follow up and confirmed 6 lb weight loss occurred in just past one month (2% body weight loss, non-signficiant -Discussed nutrition and importance of nutrients for recovery healing. Pt verbalized understanding -Wt increased 7 lb since admit, + 1.2L fluid balance  Height: Ht Readings from Last 1 Encounters:  02/25/14 5\' 9"  (1.753 m)    Weight  Status:   Wt Readings from Last 1 Encounters:  03/02/14 213 lb 6.5 oz (96.8 kg)    Re-estimated needs:  Kcal: 1850-2050 Protein: 60-75 gram, 90-100 gram when acute encephalopathy improves Fluid: >/= 1900 ml daily  Skin: WDL, jaundice  Diet Order: Diet regular   Intake/Output Summary (Last 24 hours) at 03/02/14 1238 Last data filed at 03/02/14 0900  Gross per 24 hour  Intake 1874.58 ml  Output    650 ml  Net 1224.58 ml    Last BM: 12/29   Labs:   Recent Labs Lab 02/25/14 2308  02/26/14 0520 02/27/14 0516 02/28/14 0440 03/01/14 0635 03/01/14 0645 03/02/14 0424  NA 129*  --  129* 123* 129* 132*  --  135  K 2.4*  --  2.7* 2.7* 3.0* 3.2*  --  3.5  CL 89*  --  93* 92* 99 103  --  105  CO2 28  --  26 26 24 24   --  22  BUN 9  --  8 9 9 11   --  12  CREATININE 0.42*  --  0.31* 0.38* <0.30* 0.37*  --  0.34*  CALCIUM 7.5*  --  7.3* 7.0* 7.0* 6.8*  --  7.3*  MG 1.7  < > 1.5 1.8  --   --  2.0  --   PHOS 2.5  --   --   --   --   --   --   --   GLUCOSE 86  --  84 120* 135* 94  --  97  < > = values in this interval not displayed.  CBG (last 3)  No results for input(s): GLUCAP in the last 72 hours.  Scheduled  Meds: . feeding supplement (ENSURE COMPLETE)  237 mL Oral BID BM  . lactulose  30 g Oral Daily  . multivitamin with minerals  1 tablet Oral Daily  . pantoprazole  40 mg Oral Q0600  . prednisoLONE  40 mg Oral Daily  . sodium chloride  3 mL Intravenous Q12H  . thiamine  100 mg Oral Daily    Continuous Infusions:   Lloyd Huger MS RD LDN Clinical Dietitian Pager:[phone]

## 2014-03-02 NOTE — Progress Notes (Signed)
    Progress Note   Subjective  Feels great. Had grits for breakfast   Objective   Vital signs in last 24 hours: Temp:  [98.3 F (36.8 C)-98.9 F (37.2 C)] 98.4 F (36.9 C) (12/31 0554) Pulse Rate:  [77-135] 135 (12/31 0554) Resp:  [18-20] 18 (12/31 0554) BP: (128-143)/(77-99) 143/99 mmHg (12/31 0554) SpO2:  [97 %-99 %] 99 % (12/31 0554) Weight:  [213 lb 6.5 oz (96.8 kg)] 213 lb 6.5 oz (96.8 kg) (12/31 0605) Last BM Date: 02/28/14 General:    white male in NAD Heart:  Regular rate and rhythm Lungs: Respirations even and unlabored, lungs CTA bilaterally Abdomen:  Soft, nontender and nondistended. Normal bowel sounds. Extremities:  Without edema. Neurologic:  Alert and oriented,  grossly normal neurologically. Psych:  Cooperative. Normal mood and affect.  Lab Results:  Recent Labs  03/01/14 0635 03/02/14 0424  WBC 9.5 9.6  HGB 11.4* 11.7*  HCT 33.4* 34.7*  PLT 121* 122*   BMET  Recent Labs  02/28/14 0440 03/01/14 0635 03/02/14 0424  NA 129* 132* 135  K 3.0* 3.2* 3.5  CL 99 103 105  CO2 24 24 22   GLUCOSE 135* 94 97  BUN 9 11 12   CREATININE <0.30* 0.37* 0.34*  CALCIUM 7.0* 6.8* 7.3*   LFT  Recent Labs  03/02/14 0424  PROT 5.3*  ALBUMIN 1.8*  AST 131*  ALT 73*  ALKPHOS 261*  BILITOT 20.7*     Assessment / Plan:    63 year old male with history of ETOH abuse admitted with suspected ETOH hepatitis. Prednisolone started 2 days ago.  Stop drinking.  Continue steroids for presumed ETOH hepatitis.   CIWA protocol ongoing.   Nutritional supplements, he hasn't been eating.  No diarrhea on Lactulose, stools soft. He can continue Lactulose outpatient    I made him a follow up with me mid January       LOS: 5 days   Willette Cluster  03/02/2014, 9:31 AM   [facility] Attending  I have also seen and assessed the patient and agree with the above note. He is apparently overestimating his oral intake and trying to go home. Sister says he  told his wife that he would be dced today. He would likely benefit from SNF with PT/OT and then if he will go to alcohol rehab.  We will see.  Bili slowly headed down. No other specific GI interventions right now.  Call if ?  Iva Boop, MD, [facility] [phone] (pager) 03/02/2014 1:29 PM

## 2014-03-03 DIAGNOSIS — K7010 Alcoholic hepatitis without ascites: Principal | ICD-10-CM

## 2014-03-03 DIAGNOSIS — E871 Hypo-osmolality and hyponatremia: Secondary | ICD-10-CM | POA: Insufficient documentation

## 2014-03-03 DIAGNOSIS — D696 Thrombocytopenia, unspecified: Secondary | ICD-10-CM

## 2014-03-03 LAB — COMPREHENSIVE METABOLIC PANEL
ALBUMIN: 1.7 g/dL — AB (ref 3.5–5.2)
ALT: 70 U/L — AB (ref 0–53)
ANION GAP: 7 (ref 5–15)
AST: 114 U/L — ABNORMAL HIGH (ref 0–37)
Alkaline Phosphatase: 240 U/L — ABNORMAL HIGH (ref 39–117)
BILIRUBIN TOTAL: 17.9 mg/dL — AB (ref 0.3–1.2)
BUN: 13 mg/dL (ref 6–23)
CALCIUM: 7 mg/dL — AB (ref 8.4–10.5)
CHLORIDE: 102 meq/L (ref 96–112)
CO2: 19 mmol/L (ref 19–32)
CREATININE: 0.39 mg/dL — AB (ref 0.50–1.35)
GFR calc Af Amer: >90 mL/min (ref >90)
GFR calc non Af Amer: >90 mL/min (ref >90)
Glucose, Bld: 116 mg/dL — ABNORMAL HIGH (ref 70–99)
Potassium: 3.6 mmol/L (ref 3.5–5.1)
SODIUM: 128 mmol/L — AB (ref 135–145)
TOTAL PROTEIN: 4.8 g/dL — AB (ref 6.0–8.3)

## 2014-03-03 LAB — BASIC METABOLIC PANEL
ANION GAP: 9 (ref 5–15)
BUN: 14 mg/dL (ref 6–23)
CO2: 23 mmol/L (ref 19–32)
Calcium: 7.8 mg/dL — ABNORMAL LOW (ref 8.4–10.5)
Chloride: 104 mEq/L (ref 96–112)
Creatinine, Ser: <0.3 mg/dL — ABNORMAL LOW (ref 0.50–1.35)
GLUCOSE: 110 mg/dL — AB (ref 70–99)
Potassium: 3.5 mmol/L (ref 3.5–5.1)
Sodium: 136 mmol/L (ref 135–145)

## 2014-03-03 LAB — CBC
HEMATOCRIT: 33 % — AB (ref 39.0–52.0)
HEMOGLOBIN: 11.4 g/dL — AB (ref 13.0–17.0)
MCH: 37.1 pg — AB (ref 26.0–34.0)
MCHC: 34.5 g/dL (ref 30.0–36.0)
MCV: 107.5 fL — ABNORMAL HIGH (ref 78.0–100.0)
PLATELETS: 106 10*3/uL — AB (ref 150–400)
RBC: 3.07 MIL/uL — ABNORMAL LOW (ref 4.22–5.81)
RDW: 17.8 % — AB (ref 11.5–15.5)
WBC: 10.3 10*3/uL (ref 4.0–10.5)

## 2014-03-03 LAB — AMMONIA: Ammonia: 81 umol/L — ABNORMAL HIGH (ref 11–32)

## 2014-03-03 MED ORDER — POTASSIUM CHLORIDE CRYS ER 20 MEQ PO TBCR
40.0000 meq | EXTENDED_RELEASE_TABLET | Freq: Once | ORAL | Status: AC
  Administered 2014-03-03: 40 meq via ORAL
  Filled 2014-03-03: qty 2

## 2014-03-03 MED ORDER — FUROSEMIDE 10 MG/ML IJ SOLN
80.0000 mg | Freq: Once | INTRAMUSCULAR | Status: AC
  Administered 2014-03-03: 80 mg via INTRAVENOUS
  Filled 2014-03-03: qty 8

## 2014-03-03 MED ORDER — FUROSEMIDE 10 MG/ML IJ SOLN
40.0000 mg | Freq: Once | INTRAMUSCULAR | Status: AC
  Administered 2014-03-03: 40 mg via INTRAVENOUS
  Filled 2014-03-03: qty 4

## 2014-03-03 NOTE — Progress Notes (Signed)
[facility] PROGRESS NOTE  Gary [name] ZOX:[id number] DOB: 10-07-1950 DOA: 02/25/2014 PCP: No primary care provider on file.  Assessment/Plan: #1 acute severe alcoholic hepatitis Patient noted to have LFTs in the 2:1 pattern. LFT slowly trending down. Abdominal ultrasound without any biliary obstruction however patient does have a significantly elevated bilirubin. Acute hepatitis panel was negative. Patient's calculated discriminant function is 42 and thus patient has been started on systemic steroids for severe alcoholic hepatitis treatment for 4 weeks and then a taper afterwards. Avoid hepatotoxic agents. Continue supportive care.  GI following and agree with current management. Outpatient follow-up with GI has been arranged. Patient needs to quit alcohol and understands this. Psych social work helping with alcohol rehabilitation. Follow.  #2 acute encephalopathy Secondary to hepatic encephalopathy and intoxication as patient was noted to have elevated ammonia level. Ammonia levels have trended down and currently at 94. Patient is alert oriented to self and place and time. Continue lactulose daily. Follow.  #3 hyperbilirubinemia May be secondary to problem #1. Abdominal ultrasound is negative. Bilirubin level is starting to trend down and currently at 17.9. Abdominal ultrasound was negative for any biliary obstruction. Patient is significantly jaundiced however denies any abdominal pain. GI following and agree with current management. Outpatient follow-up.   #4 hypokalemia/hypomagnesemia Likely secondary to problem #1. Replete. Keep magnesium level greater than 2.  #5 macrocytic anemia Likely secondary to problem chronic alcohol abuse. Folic acid elevated. D/C folate. Continue  thiamine.  #6 alcohol abuse Patient improvement with tremors. Continue the Ativan CIWA protocol. Psychiatry has been consulted for further evaluation for his alcohol abuse.  #7 hyponatremia Likely secondary  to alcohol abuse and volume overload. Patient with bilateral lower extremity edema. Patient is positive approximately 5 L this hospitalization. Saline lock IV fluids. Will place on IV Lasix. Follow.  #8 thrombocytopenia Likely secondary to alcohol abuse. Slowly improving. Follow.  #9 prophylaxis PPI for GI prophylaxis. SCDs for DVT prophylaxis.  Code Status: Full Family Communication: Updated patient, sister. Disposition Plan: SNF when bed available, hopefully tomorrow.   Consultants:  Gastroenterology : Dr Leone Payor 03/01/14  Procedures:  Abdominal ultrasound 02/25/2014  Chest x-ray 02/25/2014, 02/26/2014  X-ray of the left forearm 02/25/2014  X-ray of the wrist 02/25/2014  Antibiotics:  IV vancomycin 02/26/2014>>>>> 03/01/2014  IV Zosyn 02/26/2014>>>>> 03/01/2014  HPI/Subjective: Patient alert and oriented to self place and time. Patient states he's feeling better than on admission. Patient denies any shortness of breath. No chest pain. Patient with improvement with tremors.  Objective: Filed Vitals:   03/03/14 1418  BP: 131/80  Pulse: 92  Temp: 98.4 F (36.9 C)  Resp: 20    Intake/Output Summary (Last 24 hours) at 03/03/14 2001 Last data filed at 03/03/14 1700  Gross per 24 hour  Intake    480 ml  Output   1430 ml  Net   -950 ml   Filed Weights   03/01/14 0518 03/02/14 0605 03/03/14 0524  Weight: 93 kg (205 lb 0.4 oz) 96.8 kg (213 lb 6.5 oz) 96.4 kg (212 lb 8.4 oz)    Exam:   General:  Jaundiced. Tremors noted, burt improved.  Cardiovascular: Regular rate rhythm no murmurs rubs or gallops.  Respiratory: Clear to auscultation bilaterally.  Abdomen: Soft, nontender, nondistended, positive bowel sounds.  Musculoskeletal: No clubbing cyanosis. 2+ bilateral lower extremity edema  Data Reviewed: Basic Metabolic Panel:  Recent Labs Lab 02/25/14 2308 02/26/14 0002 02/26/14 0520 02/27/14 8119 02/28/14 0440 03/01/14 1478 03/01/14 0645  03/02/14 0424 03/03/14 2956  03/03/14 1340  NA 129*  --  129* 123* 129* 132*  --  135 128* 136  K 2.4*  --  2.7* 2.7* 3.0* 3.2*  --  3.5 3.6 3.5  CL 89*  --  93* 92* 99 103  --  105 102 104  CO2 28  --  --  GLUCOSE 86  --  84 120* 135* 94  --  97 116* 110*  BUN 9  --  --  CREATININE 0.42*  --  0.31* 0.38* <0.30* 0.37*  --  0.34* 0.39* <0.30*  CALCIUM 7.5*  --  7.3* 7.0* 7.0* 6.8*  --  7.3* 7.0* 7.8*  MG 1.7 2.0 1.5 1.8  --   --  2.0  --   --   --   PHOS 2.5  --   --   --   --   --   --   --   --   --    Liver Function Tests:  Recent Labs Lab 02/27/14 0516 02/28/14 0440 03/01/14 0635 03/02/14 0424 03/03/14 0508  AST 231* 145* 127* 131* 114*  ALT 81* 68* 65* 73* 70*  ALKPHOS 303* 276* 260* 261* 240*  BILITOT 24.0* 24.0* 21.8* 20.7* 17.9*  PROT 5.5* 5.2* 4.9* 5.3* 4.8*  ALBUMIN 2.1* 1.9* 1.8* 1.8* 1.7*    Recent Labs Lab 02/25/14 2308 02/26/14 0520  LIPASE 136* 128*    Recent Labs Lab 02/25/14 1633 02/25/14 2308 02/27/14 1245 03/01/14 0635 03/03/14 0508  AMMONIA 98* 73* 105* 94* 81*   CBC:  Recent Labs Lab 02/25/14 1542  02/26/14 0520 02/27/14 0516 03/01/14 0635 03/02/14 0424 03/03/14 0508  WBC 9.3  < > 9.4 7.9 9.5 9.6 10.3  NEUTROABS 7.7  --   --  6.5  --   --   --   HGB 14.7  < > 12.2* 11.5* 11.4* 11.7* 11.4*  HCT 42.7  < > 35.8* 33.1* 33.4* 34.7* 33.0*  MCV 105.4*  < > 105.3* 104.7* 106.7* 108.1* 107.5*  PLT 110*  < > 110* 95* 121* 122* 106*  < > = values in this interval not displayed. Cardiac Enzymes: No results for input(s): CKTOTAL, CKMB, CKMBINDEX, TROPONINI in the last 168 hours. BNP (last 3 results) No results for input(s): PROBNP in the last 8760 hours. CBG: No results for input(s): GLUCAP in the last 168 hours.  Recent Results (from the past 240 hour(s))  Urine culture     Status: None   Collection Time: 02/26/14  1:10 AM  Result Value Ref Range Status   Specimen Description URINE, RANDOM   Final   Special Requests NONE  Final   Colony Count NO GROWTH Performed at [facility]   Final   Culture NO GROWTH Performed at [facility]   Final   Report Status 02/27/2014 FINAL  Final  Culture, blood (routine x 2)     Status: None (Preliminary result)   Collection Time: 02/26/14  5:36 PM  Result Value Ref Range Status   Specimen Description BLOOD RIGHT ARM  Final   Special Requests BOTTLES DRAWN AEROBIC AND ANAEROBIC 10CC  Final   Culture   Final           BLOOD CULTURE RECEIVED NO GROWTH TO DATE CULTURE WILL BE HELD FOR 5 DAYS BEFORE ISSUING A FINAL NEGATIVE REPORT Performed at [facility]    Report  Status PENDING  Incomplete  Culture, blood (routine x 2)     Status: None (Preliminary result)   Collection Time: 02/26/14  5:41 PM  Result Value Ref Range Status   Specimen Description BLOOD RIGHT HAND  Final   Special Requests BOTTLES DRAWN AEROBIC AND ANAEROBIC 10CC  Final   Culture   Final           BLOOD CULTURE RECEIVED NO GROWTH TO DATE CULTURE WILL BE HELD FOR 5 DAYS BEFORE ISSUING A FINAL NEGATIVE REPORT Performed at [facility]    Report Status PENDING  Incomplete     Studies: No results found.  Scheduled Meds: . feeding supplement (ENSURE COMPLETE)  237 mL Oral BID BM  . lactulose  30 g Oral Daily  . multivitamin with minerals  1 tablet Oral Daily  . pantoprazole  40 mg Oral Q0600  . prednisoLONE  40 mg Oral Daily  . sodium chloride  3 mL Intravenous Q12H  . thiamine  100 mg Oral Daily   Or  . thiamine  100 mg Intravenous Daily   Continuous Infusions:    Principal Problem:   Alcoholic hepatitis without ascites Active Problems:   Hyperbilirubinemia   Acute hepatitis   Alcohol dependence with withdrawal with complication   Anemia   Thrombocytopenia    Time spent: 35 minutes    Keelia Graybill M.D. [facility] Pager [phone]. If 7PM-7AM, please contact night-coverage at [email or web address], password  [facility] 03/03/2014, 8:01 PM  LOS: 6 days

## 2014-03-03 NOTE — ED Provider Notes (Signed)
CSN: [id number]     Arrival date & time 02/25/14  1502 History   First MD Initiated Contact with Patient 02/25/14 1517     Chief Complaint  Patient presents with  . Alcohol Intoxication     (Consider location/radiation/quality/duration/timing/severity/associated sxs/prior Treatment) HPI   64 yo male with history of alcohol abuse, presented to [facility] ED via EMS after neighbor noted pt appeared intoxicated and with yellow skin. Please note that pt exhibits confusion and is not reliable historian. Some information obtained from friend at bedside who explains pt drinks 10 -12 bottles of beer daily but not sure if he drinks anything else. Friend is also not sure if pt takes any other substances. He thinks pt may have vomited several times as well and he explains that the house is unkempt, with bed smell from alcohol and vomiting.     Past Medical History  Diagnosis Date  . Hypertension    History reviewed. No pertinent past surgical history. Family History  Problem Relation Age of Onset  . Heart failure Father    History  Substance Use Topics  . Smoking status: Never Smoker   . Smokeless tobacco: Never Used  . Alcohol Use: 4.8 oz/week    7 Cans of beer, 1 Shots of liquor per week    Review of Systems  Level 5 caveat because pt is encephalopathic.    Allergies  Review of patient's allergies indicates no known allergies.  Home Medications   Prior to Admission medications   Medication Sig Start Date End Date Taking? Authorizing Provider  Multiple Vitamin (MULTIVITAMIN WITH MINERALS) TABS tablet Take 1 tablet by mouth daily.   Yes Historical Provider, MD  Omega-3 Fatty Acids (FISH OIL PO) Take 1 capsule by mouth daily.   Yes Historical Provider, MD  traMADol (ULTRAM) 50 MG tablet Take 1 tablet (50 mg total) by mouth every 12 (twelve) hours as needed. 09/23/13  Yes Toy Cookey, MD  allopurinol (ZYLOPRIM) 100 MG tablet Take 100-200 mg by mouth daily. Take  for 15 days then  increase to  for 15 days.    Historical Provider, MD  colchicine 0.6 MG tablet Take 0.6 mg by mouth 2 (two) times daily.    Historical Provider, MD  lisinopril (PRINIVIL,ZESTRIL) 20 MG tablet Take 20 mg by mouth daily.    Historical Provider, MD   BP 131/80 mmHg  Pulse 92  Temp(Src) 98.4 F (36.9 C) (Oral)  Resp 20  Ht  (1.753 m)  Wt 212 lb 8.4 oz (96.4 kg)  BMI 31.37 kg/m2  SpO2 98% Physical Exam  Constitutional: He appears well-developed and well-nourished. No distress.  HENT:  Head: Normocephalic and atraumatic.  Eyes: Conjunctivae are normal. Right eye exhibits no discharge. Left eye exhibits no discharge.  Neck: Neck supple.  Cardiovascular: Normal rate, regular rhythm and normal heart sounds.  Exam reveals no gallop and no friction rub.   No murmur heard. Pulmonary/Chest: Effort normal and breath sounds normal. No respiratory distress.  Abdominal: Soft. He exhibits no distension. There is no tenderness.  Musculoskeletal: He exhibits no edema or tenderness.  Neurological: He is alert.  Disoriented to time. Very slow to answer questions. Often answers inappropriate.   Skin: Skin is warm and dry.  jaundiced  Nursing note and vitals reviewed.   ED Course  Procedures (including critical care time) Labs Review Labs Reviewed  COMPREHENSIVE METABOLIC PANEL - Abnormal; Notable for the following:    Sodium 131 (*)    Potassium 2.0 (*)  Chloride 86 (*)    Creatinine, Ser <0.30 (*)    Calcium 8.3 (*)    Albumin 2.6 (*)    AST 360 (*)    ALT 120 (*)    Alkaline Phosphatase 437 (*)    Total Bilirubin 26.3 (*)    All other components within normal limits  CBC WITH DIFFERENTIAL - Abnormal; Notable for the following:    RBC 4.05 (*)    MCV 105.4 (*)    MCH 36.3 (*)    Platelets 110 (*)    Neutrophils Relative % 82 (*)    Lymphocytes Relative 10 (*)    All other components within normal limits  ETHANOL - Abnormal; Notable for the following:    Alcohol, Ethyl  (B) 392 (*)    All other components within normal limits  PROTIME-INR - Abnormal; Notable for the following:    Prothrombin Time 16.1 (*)    All other components within normal limits  AMMONIA - Abnormal; Notable for the following:    Ammonia 98 (*)    All other components within normal limits  LIPASE, BLOOD - Abnormal; Notable for the following:    Lipase 128 (*)    All other components within normal limits  ETHANOL - Abnormal; Notable for the following:    Alcohol, Ethyl (B) 159 (*)    All other components within normal limits  COMPREHENSIVE METABOLIC PANEL - Abnormal; Notable for the following:    Sodium 129 (*)    Potassium 2.4 (*)    Chloride 89 (*)    Creatinine, Ser 0.42 (*)    Calcium 7.5 (*)    Albumin 2.3 (*)    AST 313 (*)    ALT 103 (*)    Alkaline Phosphatase 371 (*)    Total Bilirubin 22.1 (*)    All other components within normal limits  CBC - Abnormal; Notable for the following:    RBC 3.58 (*)    Hemoglobin 12.8 (*)    HCT 37.6 (*)    MCV 105.0 (*)    MCH 35.8 (*)    RDW 15.9 (*)    Platelets 119 (*)    All other components within normal limits  PROTIME-INR - Abnormal; Notable for the following:    Prothrombin Time 16.0 (*)    All other components within normal limits  URINALYSIS, ROUTINE W REFLEX MICROSCOPIC - Abnormal; Notable for the following:    Color, Urine AMBER (*)    APPearance CLOUDY (*)    Bilirubin Urine LARGE (*)    Urobilinogen, UA 4.0 (*)    Nitrite POSITIVE (*)    Leukocytes, UA TRACE (*)    All other components within normal limits  COMPREHENSIVE METABOLIC PANEL - Abnormal; Notable for the following:    Sodium 129 (*)    Potassium 2.7 (*)    Chloride 93 (*)    Creatinine, Ser 0.31 (*)    Calcium 7.3 (*)    Total Protein 5.8 (*)    Albumin 2.2 (*)    AST 299 (*)    ALT 95 (*)    Alkaline Phosphatase 345 (*)    Total Bilirubin 21.6 (*)    All other components within normal limits  CBC - Abnormal; Notable for the following:     RBC 3.40 (*)    Hemoglobin 12.2 (*)    HCT 35.8 (*)    MCV 105.3 (*)    MCH 35.9 (*)    RDW 16.1 (*)  Platelets 110 (*)    All other components within normal limits  LIPASE, BLOOD - Abnormal; Notable for the following:    Lipase 136 (*)    All other components within normal limits  AMMONIA - Abnormal; Notable for the following:    Ammonia 73 (*)    All other components within normal limits  URINE MICROSCOPIC-ADD ON - Abnormal; Notable for the following:    Squamous Epithelial / LPF FEW (*)    Bacteria, UA FEW (*)    Casts GRANULAR CAST (*)    All other components within normal limits  COMPREHENSIVE METABOLIC PANEL - Abnormal; Notable for the following:    Sodium 123 (*)    Potassium 2.7 (*)    Chloride 92 (*)    Glucose, Bld 120 (*)    Creatinine, Ser 0.38 (*)    Calcium 7.0 (*)    Total Protein 5.5 (*)    Albumin 2.1 (*)    AST 231 (*)    ALT 81 (*)    Alkaline Phosphatase 303 (*)    Total Bilirubin 24.0 (*)    All other components within normal limits  CBC WITH DIFFERENTIAL - Abnormal; Notable for the following:    RBC 3.16 (*)    Hemoglobin 11.5 (*)    HCT 33.1 (*)    MCV 104.7 (*)    MCH 36.4 (*)    RDW 15.9 (*)    Platelets 95 (*)    Neutrophils Relative % 82 (*)    Lymphocytes Relative 8 (*)    Lymphs Abs 0.6 (*)    All other components within normal limits  AMMONIA - Abnormal; Notable for the following:    Ammonia 105 (*)    All other components within normal limits  COMPREHENSIVE METABOLIC PANEL - Abnormal; Notable for the following:    Sodium 129 (*)    Potassium 3.0 (*)    Glucose, Bld 135 (*)    Creatinine, Ser <0.30 (*)    Calcium 7.0 (*)    Total Protein 5.2 (*)    Albumin 1.9 (*)    AST 145 (*)    ALT 68 (*)    Alkaline Phosphatase 276 (*)    Total Bilirubin 24.0 (*)    All other components within normal limits  COMPREHENSIVE METABOLIC PANEL - Abnormal; Notable for the following:    Sodium 132 (*)    Potassium 3.2 (*)    Creatinine, Ser  0.37 (*)    Calcium 6.8 (*)    Total Protein 4.9 (*)    Albumin 1.8 (*)    AST 127 (*)    ALT 65 (*)    Alkaline Phosphatase 260 (*)    Total Bilirubin 21.8 (*)    All other components within normal limits  CBC - Abnormal; Notable for the following:    RBC 3.13 (*)    Hemoglobin 11.4 (*)    HCT 33.4 (*)    MCV 106.7 (*)    MCH 36.4 (*)    RDW 17.1 (*)    Platelets 121 (*)    All other components within normal limits  AMMONIA - Abnormal; Notable for the following:    Ammonia 94 (*)    All other components within normal limits  COMPREHENSIVE METABOLIC PANEL - Abnormal; Notable for the following:    Creatinine, Ser 0.34 (*)    Calcium 7.3 (*)    Total Protein 5.3 (*)    Albumin 1.8 (*)    AST 131 (*)  ALT 73 (*)    Alkaline Phosphatase 261 (*)    Total Bilirubin 20.7 (*)    All other components within normal limits  CBC - Abnormal; Notable for the following:    RBC 3.21 (*)    Hemoglobin 11.7 (*)    HCT 34.7 (*)    MCV 108.1 (*)    MCH 36.4 (*)    RDW 16.9 (*)    Platelets 122 (*)    All other components within normal limits  VITAMIN B12 - Abnormal; Notable for the following:    Vitamin B-12 >2000 (*)    All other components within normal limits  IRON AND TIBC - Abnormal; Notable for the following:    TIBC 130 (*)    Saturation Ratios 85 (*)    UIBC 19 (*)    All other components within normal limits  FERRITIN - Abnormal; Notable for the following:    Ferritin 7766 (*)    All other components within normal limits  COMPREHENSIVE METABOLIC PANEL - Abnormal; Notable for the following:    Sodium 128 (*)    Glucose, Bld 116 (*)    Creatinine, Ser 0.39 (*)    Calcium 7.0 (*)    Total Protein 4.8 (*)    Albumin 1.7 (*)    AST 114 (*)    ALT 70 (*)    Alkaline Phosphatase 240 (*)    Total Bilirubin 17.9 (*)    All other components within normal limits  AMMONIA - Abnormal; Notable for the following:    Ammonia 81 (*)    All other components within normal limits   CBC - Abnormal; Notable for the following:    RBC 3.07 (*)    Hemoglobin 11.4 (*)    HCT 33.0 (*)    MCV 107.5 (*)    MCH 37.1 (*)    RDW 17.8 (*)    Platelets 106 (*)    All other components within normal limits  BASIC METABOLIC PANEL - Abnormal; Notable for the following:    Glucose, Bld 110 (*)    Creatinine, Ser <0.30 (*)    Calcium 7.8 (*)    All other components within normal limits  URINE CULTURE  CULTURE, BLOOD (ROUTINE X 2)  CULTURE, BLOOD (ROUTINE X 2)  HEPATITIS PANEL, ACUTE  MAGNESIUM  MAGNESIUM  PHOSPHORUS  TSH  URINE RAPID DRUG SCREEN (HOSP PERFORMED)  MAGNESIUM  MAGNESIUM  MAGNESIUM  FOLATE  BASIC METABOLIC PANEL  CBC    Imaging Review No results found.   EKG Interpretation None      MDM   Final diagnoses:  Total bilirubin, elevated  Fall  Alcoholic hepatitis Hepatic Encephalopathy      Raeford Razor, MD 03/03/14 2342

## 2014-03-03 NOTE — Progress Notes (Signed)
Clinical Social Work  MD reports no DC today but possibly ready for DC tomorrow. Report left for weekend CSW. CSW updated [facility] who is agreeable to accept over the weekend. Weekend CSW can call SNF and ask for RN for room 311 and can fax DC summary to [phone]. Signed FL2 in chart.   Unk Lightning, LCSW (Coverage for [employer])

## 2014-03-03 NOTE — Progress Notes (Signed)
Clinical Social Work  [facility] agreeable to accept patient on Letter of Guarantee. SNF can accept patient today or tomorrow, whenever patient is medically stable. CSW made patient aware of DC plans and patient is agreeable and reports he is thankful that the hospital will assist him. Patient requests that CSW update his sister and neighbor Trey Paula). CSW updated family and friends re: DC plans and will continue to follow.  CSW made MD aware that patient has a SNF bed available whenever patient is medically stable.  Unk Lightning, LCSW  (Coverage for [employer])

## 2014-03-03 NOTE — Progress Notes (Signed)
Clinical Social Work  CSW continues to work on placement. Admitting confirmed [employer] policy number which was placed in chart. CSW spoke with [facility] who reports they were unable to get confirmation that patient has [employer]. [facility] agreeable to talk with administrator to determine if they can accept letter of guarantee for patient.  CSW will continue to follow.  Unk Lightning, LCSW (Coverage for [employer])

## 2014-03-04 LAB — BASIC METABOLIC PANEL
ANION GAP: 11 (ref 5–15)
Anion gap: 8 (ref 5–15)
BUN: 14 mg/dL (ref 6–23)
BUN: 15 mg/dL (ref 6–23)
CO2: 23 mmol/L (ref 19–32)
CO2: 22 mmol/L (ref 19–32)
CREATININE: 0.64 mg/dL (ref 0.50–1.35)
Calcium: 7.6 mg/dL — ABNORMAL LOW (ref 8.4–10.5)
Calcium: 7.9 mg/dL — ABNORMAL LOW (ref 8.4–10.5)
Chloride: 100 mEq/L (ref 96–112)
Chloride: 100 mEq/L (ref 96–112)
Creatinine, Ser: 0.57 mg/dL (ref 0.50–1.35)
GFR calc Af Amer: >90 mL/min (ref >90)
GFR calc Af Amer: >90 mL/min (ref >90)
GLUCOSE: 152 mg/dL — AB (ref 70–99)
Glucose, Bld: 119 mg/dL — ABNORMAL HIGH (ref 70–99)
POTASSIUM: 3.4 mmol/L — AB (ref 3.5–5.1)
Potassium: 3.4 mmol/L — ABNORMAL LOW (ref 3.5–5.1)
SODIUM: 133 mmol/L — AB (ref 135–145)
Sodium: 131 mmol/L — ABNORMAL LOW (ref 135–145)

## 2014-03-04 LAB — CBC
HCT: 38.6 % — ABNORMAL LOW (ref 39.0–52.0)
Hemoglobin: 13 g/dL (ref 13.0–17.0)
MCH: 36.9 pg — ABNORMAL HIGH (ref 26.0–34.0)
MCHC: 33.7 g/dL (ref 30.0–36.0)
MCV: 109.7 fL — ABNORMAL HIGH (ref 78.0–100.0)
Platelets: 135 10*3/uL — ABNORMAL LOW (ref 150–400)
RBC: 3.52 MIL/uL — ABNORMAL LOW (ref 4.22–5.81)
RDW: 17.3 % — AB (ref 11.5–15.5)
WBC: 11.8 10*3/uL — AB (ref 4.0–10.5)

## 2014-03-04 LAB — MAGNESIUM: MAGNESIUM: 1.8 mg/dL (ref 1.5–2.5)

## 2014-03-04 MED ORDER — POTASSIUM CHLORIDE CRYS ER 20 MEQ PO TBCR
40.0000 meq | EXTENDED_RELEASE_TABLET | Freq: Once | ORAL | Status: AC
  Administered 2014-03-04: 40 meq via ORAL
  Filled 2014-03-04: qty 2

## 2014-03-04 MED ORDER — POTASSIUM CHLORIDE CRYS ER 20 MEQ PO TBCR: 40.0000 meq | EXTENDED_RELEASE_TABLET | Freq: Every day | ORAL | Status: DC

## 2014-03-04 MED ORDER — MAGNESIUM SULFATE 2 GM/50ML IV SOLN
2.0000 g | Freq: Once | INTRAVENOUS | Status: AC
  Administered 2014-03-04: 2 g via INTRAVENOUS
  Filled 2014-03-04: qty 50

## 2014-03-04 MED ORDER — ADULT MULTIVITAMIN W/MINERALS CH: 1.0000 | ORAL_TABLET | Freq: Every day | ORAL | Status: DC

## 2014-03-04 MED ORDER — FUROSEMIDE 40 MG PO TABS: 40.0000 mg | ORAL_TABLET | Freq: Every day | ORAL | Status: DC

## 2014-03-04 MED ORDER — PREDNISONE 20 MG PO TABS: 40.0000 mg | ORAL_TABLET | Freq: Every day | ORAL | Status: DC

## 2014-03-04 MED ORDER — THIAMINE HCL 100 MG PO TABS: 100.0000 mg | ORAL_TABLET | Freq: Every day | ORAL | Status: DC

## 2014-03-04 MED ORDER — NAPHAZOLINE HCL 0.1 % OP SOLN
2.0000 [drp] | Freq: Four times a day (QID) | OPHTHALMIC | Status: DC | PRN
  Administered 2014-03-04: 2 [drp] via OPHTHALMIC
  Filled 2014-03-04: qty 15

## 2014-03-04 MED ORDER — POTASSIUM CHLORIDE CRYS ER 20 MEQ PO TBCR
40.0000 meq | EXTENDED_RELEASE_TABLET | Freq: Once | ORAL | Status: AC
  Administered 2014-03-04: 40 meq via ORAL
  Filled 2014-03-04: qty 4

## 2014-03-04 MED ORDER — TRAMADOL HCL 50 MG PO TABS: 50.0000 mg | ORAL_TABLET | Freq: Two times a day (BID) | ORAL | Status: DC | PRN

## 2014-03-04 MED ORDER — PANTOPRAZOLE SODIUM 40 MG PO TBEC: 40.0000 mg | DELAYED_RELEASE_TABLET | Freq: Every day | ORAL | Status: DC

## 2014-03-04 MED ORDER — LACTULOSE 10 GM/15ML PO SOLN: 30.0000 g | Freq: Every day | ORAL | Status: DC

## 2014-03-04 MED ORDER — FUROSEMIDE 10 MG/ML IJ SOLN
40.0000 mg | Freq: Once | INTRAMUSCULAR | Status: AC
  Administered 2014-03-04: 40 mg via INTRAVENOUS
  Filled 2014-03-04: qty 4

## 2014-03-04 MED ORDER — ENSURE COMPLETE PO LIQD: 237.0000 mL | Freq: Two times a day (BID) | ORAL | Status: DC

## 2014-03-04 NOTE — Clinical Social Work Note (Signed)
CSW faxed discharge paperwork to [facility] and followed up with a phone call to let the facility know that pt was ready for discharge  CSW prepared discharge packet and provided it to RN  CSW called for transport  CSW spoke with pt and let him know that he was ready for discharge.  He stated that he would call his sister when he got settled in  No further CSW needs  CSW signing off  .Gary Buba, LCSW [facility] Clinical Social Worker - Weekend Coverage cell #: [phone]

## 2014-03-04 NOTE — Discharge Summary (Signed)
Physician Discharge Summary  Gary [name] ZOX:[id number] DOB: June 29, 1950 DOA: 02/25/2014  PCP: No primary care provider on file.  Admit date: 02/25/2014 Discharge date: 03/04/2014  Time spent: 70 minutes  Recommendations for Outpatient Follow-up:  1. Follow-up with followed Willette Cluster, PA gastroenterology on 03/16/2013 for further management of patient's severe alcoholic hepatitis. Patient has been discharge on prednisone 40 mg daily which patient need to be L4 week and then tapered slowly thereafter. Patient need a comprehensive metabolic profile done to follow-up on electrolytes and renal function as well as LFTs. 2. Follow-up with M.D. at skilled nursing facility. Patient will need a comprehensive metabolic profile done in 1 week to follow-up on electrolytes and renal function and LFTs.  Discharge Diagnoses:  Principal Problem:   Alcoholic hepatitis without ascites Active Problems:   Hyperbilirubinemia   Acute hepatitis   Alcohol dependence with withdrawal with complication   Anemia   Thrombocytopenia   Hyponatremia   Discharge Condition: Stable and improved  Diet recommendation: Heart healthy  Filed Weights   03/02/14 0605 03/03/14 0524 03/04/14 0522  Weight: 96.8 kg (213 lb 6.5 oz) 96.4 kg (212 lb 8.4 oz) 93.6 kg (206 lb 5.6 oz)    History of present illness:  Pt is 64 yo male with history of alcohol abuse, presented to [facility] ED via EMS after neighbor noted pt appeared intoxicated and with yellow skin. Please note that pt exhibits confusion and is not reliable historian. Some information obtained from friend at bedside who explained that, pt drinks 10 -12 bottles of beer daily but not sure if he drank anything else. Friend is also not sure if pt took any other substances. He thought pt may have vomited several times as well and he explained that the house was unkempt, with bed smelt from alcohol and vomiting. In ED, pt appeared intoxicated. VS notable for oxygen sat 87% on RA.  Blood work notable for K 2, Na 131, severe transaminitis with bili > 25, PE significant for jaundice as well. TRH was asked to admit for management of acute alcoholic hepatitis. Abd [place] requested in ED and pending  Hospital Course:  #1 acute severe alcoholic hepatitis Patient noted to have LFTs in the 2:1 pattern. LFT slowly trending down. Abdominal ultrasound without any biliary obstruction however patient did have a significantly elevated bilirubin. Acute hepatitis panel was negative. Patient's calculated discriminant function is 42 and thus patient has been started on systemic steroids for severe alcoholic hepatitis treatment for 4 weeks and then a taper afterwards. Avoid hepatotoxic agents. Continue supportive care. GI was consulted and followed the patient throughout the hospitalization and agreed with current management. Outpatient follow-up with GI has been arranged. Patient needs to quit alcohol and understands this. Psych social work helping with alcohol rehabilitation. Follow.  #2 acute encephalopathy Secondary to hepatic encephalopathy and intoxication as patient was noted to have elevated ammonia level. Ammonia levels have trended down. Patient is alert oriented to self and place and time. Patient improved clinically was maintained on lactulose daily was back to his baseline by day of discharge.  #3 hyperbilirubinemia Secondary to problem #1. Abdominal ultrasound is negative. Bilirubin level started to trend down and currently at 17.9, by day of discharge. Abdominal ultrasound was negative for any biliary obstruction. Patient is significantly jaundiced however denies any abdominal pain. GI following and agree with current management. Outpatient follow-up.   #4 hypokalemia/hypomagnesemia Likely secondary to problem #1. Repleted.   #5 macrocytic anemia Likely secondary to problem chronic alcohol abuse.  Folic acid elevated. D/C folate. Continued thiamine.  #6 alcohol abuse Patient was  noted to have a history of extensive alcohol abuse. Patient was placed on the Ativan CIWA protocol. Patient improved clinically had less tremors and did not have any DTs during the hospitalization. Patient was maintained on thiamine and multivitamin during the hospitalization. Patient was noted to have a elevated folic acid level and a such folic acid was discontinued. Patient will need to follow-up as outpatient for alcohol abuse.   #7 hyponatremia/lower extremity edema/volume overload Likely secondary to alcohol abuse and volume overload. Patient with bilateral lower extremity edema. Patient is positive approximately 5 L this hospitalization. Patient was diuresed with IV Lasix with good diuresis. Patient's lower extremity edema improved. Patient's hyponatremia also improved and patient will be discharged on 2-3 days of oral Lasix. Patient on need outpatient follow-up.   #8 thrombocytopenia Likely secondary to alcohol abuse. Slowly improving. Outpatient follow-up. Follow.  Procedures:  Abdominal ultrasound 02/25/2014  Chest x-ray 02/25/2014, 02/26/2014  X-ray of the left forearm 02/25/2014  X-ray of the wrist 02/25/2014    Consultations:  Gastroenterology : Dr Leone Payor 03/01/14  Discharge Exam: Filed Vitals:   03/04/14 1439  BP: 134/90  Pulse: 99  Temp: 97.5 F (36.4 C)  Resp: 20    General: NAD. Jaundiced. Cardiovascular: RRR Respiratory: CTAB Extremities: No clubbing or cyanosis. 1-2+ bilateral lower extremity edema.  Discharge Instructions   Discharge Instructions    Diet - low sodium heart healthy    Complete by:  As directed      Discharge instructions    Complete by:  As directed   Follow up with Willette Cluster on 03/16/13 at 10am     Increase activity slowly    Complete by:  As directed           Current Discharge Medication List    START taking these medications   Details  feeding supplement, ENSURE COMPLETE, (ENSURE COMPLETE) LIQD Take 237 mLs by mouth  2 (two) times daily between meals.    furosemide (LASIX) 40 MG tablet Take 1 tablet (40 mg total) by mouth daily. USE FOR 3 DAYS THEN STOP. Qty: 3 tablet, Refills: 0    lactulose (CHRONULAC) 10 GM/15ML solution Take 45 mLs (30 g total) by mouth daily. Qty: 240 mL, Refills: 0    !! Multiple Vitamin (MULTIVITAMIN WITH MINERALS) TABS tablet Take 1 tablet by mouth daily.    pantoprazole (PROTONIX) 40 MG tablet Take 1 tablet (40 mg total) by mouth daily at 6 (six) AM. Qty: 30 tablet, Refills: 0    potassium chloride SA (K-DUR,KLOR-CON) 20 MEQ tablet Take 2 tablets (40 mEq total) by mouth daily. Take for 4 days then stop. Qty: 8 tablet, Refills: 0    predniSONE (DELTASONE) 20 MG tablet Take 2 tablets (40 mg total) by mouth daily with breakfast. Qty: 90 tablet, Refills: 0    thiamine 100 MG tablet Take 1 tablet (100 mg total) by mouth daily.     !! - Potential duplicate medications found. Please discuss with provider.    CONTINUE these medications which have CHANGED   Details  traMADol (ULTRAM) 50 MG tablet Take 1 tablet (50 mg total) by mouth every 12 (twelve) hours as needed. Qty: 20 tablet, Refills: 0      CONTINUE these medications which have NOT CHANGED   Details  !! Multiple Vitamin (MULTIVITAMIN WITH MINERALS) TABS tablet Take 1 tablet by mouth daily.    Omega-3 Fatty Acids (FISH OIL PO)  Take 1 capsule by mouth daily.    allopurinol (ZYLOPRIM) 100 MG tablet Take 100-200 mg by mouth daily. Take 100mg  for 15 days then increase to 200mg  for 15 days.    colchicine 0.6 MG tablet Take 0.6 mg by mouth 2 (two) times daily.    lisinopril (PRINIVIL,ZESTRIL) 20 MG tablet Take 20 mg by mouth daily.     !! - Potential duplicate medications found. Please discuss with provider.     No Known Allergies Follow-up Information    Follow up with Willette Cluster, NP On 03/16/2014.   Specialty:  Nurse Practitioner   Why:  at 10:00am   Contact information:   [address] [place]  [address] [phone]       Please follow up.   Why:  f/u with MD at SNF       The results of significant diagnostics from this hospitalization (including imaging, microbiology, ancillary and laboratory) are listed below for reference.    Significant Diagnostic Studies: X-ray Chest Pa And Lateral  02/25/2014   CLINICAL DATA:  Acute onset of dyspnea for 1 day. Initial encounter.  EXAM: CHEST  2 VIEW  COMPARISON:  Chest radiograph performed 09/23/2013  FINDINGS: The lungs are well-aerated. There is chronic elevation of the right hemidiaphragm. Minimal left basilar atelectasis is noted. There is no evidence of pleural effusion or pneumothorax.  The heart is borderline enlarged. No acute osseous abnormalities are seen. Multiple chronic right-sided rib deformities are again noted.  IMPRESSION: Chronic elevation of the right hemidiaphragm. Minimal left basilar atelectasis noted. Borderline cardiomegaly.   Electronically Signed   By: Roanna Raider M.D.   On: 02/25/2014 21:32   Dg Forearm Left  02/25/2014   CLINICAL DATA:  Forearm pain following fall, initial encounter  EXAM: LEFT FOREARM - 2 VIEW  COMPARISON:  None.  FINDINGS: No acute fracture dislocation is noted. Some mild prominence of the cortexes noted in the distal ulna. This may be related to prior trauma.  IMPRESSION: No acute abnormality noted.   Electronically Signed   By: Alcide Clever M.D.   On: 02/25/2014 19:33   Dg Wrist Complete Left  02/25/2014   CLINICAL DATA:  Fall.  EXAM: LEFT WRIST - COMPLETE 3+ VIEW  COMPARISON:  Forearm series performed today.  FINDINGS: Old healed fracture through the distal left ulnar metaphysis. No acute fracture. Joint spaces are maintained.  IMPRESSION: No acute bony abnormality.   Electronically Signed   By: Charlett Nose M.D.   On: 02/25/2014 19:33   US Abdomen Complete  02/25/2014   CLINICAL DATA:  Elevated bilirubin, hepatitis  EXAM: ULTRASOUND ABDOMEN COMPLETE  COMPARISON:  None.  FINDINGS:  Gallbladder: Gallbladder wall is mildly thickened. The gallbladder is nondistended. Small amount of sludge with in the gallbladder. No pericholecystic fluid. Negative sonographic Murphy's sign.  Common bile duct: Diameter: Normal at 3 mm.  Liver: The liver is enlarged and densely echogenic. No biliary duct dilatation is evident.  IVC: No abnormality visualized.  Pancreas: Poorly visualized due to enlarged liver  Spleen: Size and appearance within normal limits.  Right Kidney: Length: 12.8 cm. Echogenicity within normal limits. No mass or hydronephrosis visualized.  Left Kidney: Length: 13.5 cm. Echogenicity within normal limits. No mass or hydronephrosis visualized.  Abdominal aorta: No aneurysm visualized.  Other findings: No ascites  IMPRESSION: 1. Densely echogenic enlarged liver is consistent with history of hepatocellular disease (hepatitis). 2. Gallbladder wall thickening without distension. Small sludge. Negative sonographic Murphy's sign. 3. No evidence  of biliary obstruction.   Electronically Signed   By: Genevive Bi M.D.   On: 02/25/2014 20:32   Dg Chest Port 1 View  02/26/2014   CLINICAL DATA:  Fever and weakness  EXAM: PORTABLE CHEST - 1 VIEW  COMPARISON:  02/25/2014  FINDINGS: Cardiac shadow is stable. The right hemidiaphragm is again elevated. Old rib fractures are again seen on the right. The lungs are clear bilaterally.  IMPRESSION: No active disease.   Electronically Signed   By: Alcide Clever M.D.   On: 02/26/2014 18:47    Microbiology: Recent Results (from the past 240 hour(s))  Urine culture     Status: None   Collection Time: 02/26/14  1:10 AM  Result Value Ref Range Status   Specimen Description URINE, RANDOM  Final   Special Requests NONE  Final   Colony Count NO GROWTH Performed at [facility]   Final   Culture NO GROWTH Performed at [facility]   Final   Report Status 02/27/2014 FINAL  Final  Culture, blood (routine x 2)     Status: None  (Preliminary result)   Collection Time: 02/26/14  5:36 PM  Result Value Ref Range Status   Specimen Description BLOOD RIGHT ARM  Final   Special Requests BOTTLES DRAWN AEROBIC AND ANAEROBIC 10CC  Final   Culture   Final           BLOOD CULTURE RECEIVED NO GROWTH TO DATE CULTURE WILL BE HELD FOR 5 DAYS BEFORE ISSUING A FINAL NEGATIVE REPORT Performed at [facility]    Report Status PENDING  Incomplete  Culture, blood (routine x 2)     Status: None (Preliminary result)   Collection Time: 02/26/14  5:41 PM  Result Value Ref Range Status   Specimen Description BLOOD RIGHT HAND  Final   Special Requests BOTTLES DRAWN AEROBIC AND ANAEROBIC 10CC  Final   Culture   Final           BLOOD CULTURE RECEIVED NO GROWTH TO DATE CULTURE WILL BE HELD FOR 5 DAYS BEFORE ISSUING A FINAL NEGATIVE REPORT Performed at [facility]    Report Status PENDING  Incomplete     Labs: Basic Metabolic Panel:  Recent Labs Lab 02/25/14 2308 02/26/14 0002 02/26/14 0520 02/27/14 0516  03/01/14 0645 03/02/14 0424 03/03/14 0508 03/03/14 1340 03/04/14 0415 03/04/14 1424  NA 129*  --  129* 123*  < >  --  135 128* 136 131* 133*  K 2.4*  --  2.7* 2.7*  < >  --  3.5 3.6 3.5 3.4* 3.4*  CL 89*  --  93* 92*  < >  --  105 102 104 100 100  CO2 28  --  26 26  < >  --  GLUCOSE 86  --  84 120*  < >  --  97 116* 110* 119* 152*  BUN 9  --  8 9  < >  --  CREATININE 0.42*  --  0.31* 0.38*  < >  --  0.34* 0.39* <0.30* 0.57 0.64  CALCIUM 7.5*  --  7.3* 7.0*  < >  --  7.3* 7.0* 7.8* 7.6* 7.9*  MG 1.7 2.0 1.5 1.8  --  2.0  --   --   --  1.8  --   PHOS 2.5  --   --   --   --   --   --   --   --   --   --   < > =  values in this interval not displayed. Liver Function Tests:  Recent Labs Lab 02/27/14 0516 02/28/14 0440 03/01/14 0635 03/02/14 0424 03/03/14 0508  AST 231* 145* 127* 131* 114*  ALT 81* 68* 65* 73* 70*  ALKPHOS 303* 276* 260* 261* 240*  BILITOT 24.0* 24.0*  21.8* 20.7* 17.9*  PROT 5.5* 5.2* 4.9* 5.3* 4.8*  ALBUMIN 2.1* 1.9* 1.8* 1.8* 1.7*    Recent Labs Lab 02/25/14 2308 02/26/14 0520  LIPASE 136* 128*    Recent Labs Lab 02/25/14 1633 02/25/14 2308 02/27/14 1245 03/01/14 0635 03/03/14 0508  AMMONIA 98* 73* 105* 94* 81*   CBC:  Recent Labs Lab 02/27/14 0516 03/01/14 0635 03/02/14 0424 03/03/14 0508 03/04/14 0415  WBC 7.9 9.5 9.6 10.3 11.8*  NEUTROABS 6.5  --   --   --   --   HGB 11.5* 11.4* 11.7* 11.4* 13.0  HCT 33.1* 33.4* 34.7* 33.0* 38.6*  MCV 104.7* 106.7* 108.1* 107.5* 109.7*  PLT 95* 121* 122* 106* 135*   Cardiac Enzymes: No results for input(s): CKTOTAL, CKMB, CKMBINDEX, TROPONINI in the last 168 hours. BNP: BNP (last 3 results) No results for input(s): PROBNP in the last 8760 hours. CBG: No results for input(s): GLUCAP in the last 168 hours.     SignedRamiro Harvest  M.D. [facility] 03/04/2014, 4:05 PM

## 2014-03-04 NOTE — Clinical Social Work Note (Signed)
CSW received call from MD stating pt was ready for discharge  CSW called [facility] and also faxed discharge summary  CSW prepared packet and gave to RN  CSW called for transport  No further CSW needs  CSW signing off  Elray Buba, LCSW [facility] Clinical Social Worker - Weekend Coverage cell #: [phone]

## 2014-03-05 LAB — CULTURE, BLOOD (ROUTINE X 2)
CULTURE: NO GROWTH
Culture: NO GROWTH

## 2014-03-05 NOTE — Clinical Social Work Placement (Signed)
Clinical Social Work Department CLINICAL SOCIAL WORK PLACEMENT NOTE 03/05/2014  Patient:  Gary [name], Gary [name]  Account Number:  [id number] Admit date:  02/25/2014  Clinical Social Worker:  Unk Lightning, LCSW  Date/time:  03/02/2014 03:00 PM  Clinical Social Work is seeking post-discharge placement for this patient at the following level of care:   SKILLED NURSING   (*CSW will update this form in Epic as items are completed)   03/02/2014  Patient/family provided with [facility] Department of Clinical Social Work's list of facilities offering this level of care within the geographic area requested by the patient (or if unable, by the patient's family).  03/02/2014  Patient/family informed of their freedom to choose among providers that offer the needed level of care, that participate in Medicare, Medicaid or managed care program needed by the patient, have an available bed and are willing to accept the patient.  03/02/2014  Patient/family informed of [facility]' ownership interest in [facility], as well as of the fact that they are under no obligation to receive care at this facility.  PASARR submitted to EDS on 03/02/2014 PASARR number received on 03/02/2014  FL2 transmitted to all facilities in geographic area requested by pt/family on  03/02/2014 FL2 transmitted to all facilities within larger geographic area on   Patient informed that his/her managed care company has contracts with or will negotiate with  certain facilities, including the following:     Patient/family informed of bed offers received:  03/04/2014 Patient chooses bed at [facility] Physician recommends and patient chooses bed at  [facility]  Patient to be transferred to [facility] on  03/04/2014 Patient to be transferred to facility by ambulance Patient and family notified of transfer on 03/04/2014 Name of family member notified:   Efraim Kaufmann  The following physician request were entered in Epic:   Additional Comments: .Elray Buba, LCSW [facility] Clinical Social Worker - Weekend Coverage cell #: [phone]

## 2014-03-16 ENCOUNTER — Ambulatory Visit (INDEPENDENT_AMBULATORY_CARE_PROVIDER_SITE_OTHER): Payer: 59 | Admitting: Nurse Practitioner

## 2014-03-16 ENCOUNTER — Telehealth: Payer: Self-pay | Admitting: *Deleted

## 2014-03-16 ENCOUNTER — Other Ambulatory Visit (INDEPENDENT_AMBULATORY_CARE_PROVIDER_SITE_OTHER): Payer: 59

## 2014-03-16 ENCOUNTER — Encounter: Payer: Self-pay | Admitting: Nurse Practitioner

## 2014-03-16 VITALS — BP 114/60 | HR 82 | Ht 69.0 in | Wt 198.4 lb

## 2014-03-16 DIAGNOSIS — K7010 Alcoholic hepatitis without ascites: Secondary | ICD-10-CM

## 2014-03-16 LAB — COMPREHENSIVE METABOLIC PANEL
ALK PHOS: 331 U/L — AB (ref 39–117)
ALT: 89 U/L — ABNORMAL HIGH (ref 0–53)
AST: 141 U/L — AB (ref 0–37)
Albumin: 2.7 g/dL — ABNORMAL LOW (ref 3.5–5.2)
BILIRUBIN TOTAL: 11.8 mg/dL — AB (ref 0.2–1.2)
BUN: 13 mg/dL (ref 6–23)
CALCIUM: 8.1 mg/dL — AB (ref 8.4–10.5)
CO2: 28 meq/L (ref 19–32)
Chloride: 93 mEq/L — ABNORMAL LOW (ref 96–112)
Creatinine, Ser: 1.05 mg/dL (ref 0.40–1.50)
GFR: 75.61 mL/min (ref >60.00)
GLUCOSE: 102 mg/dL — AB (ref 70–99)
Potassium: 2.7 mEq/L — CL (ref 3.5–5.1)
Sodium: 130 mEq/L — ABNORMAL LOW (ref 135–145)
TOTAL PROTEIN: 5.7 g/dL — AB (ref 6.0–8.3)

## 2014-03-16 MED ORDER — PREDNISONE 10 MG PO TABS: 10.0000 mg | ORAL_TABLET | Freq: Every day | ORAL | Status: DC

## 2014-03-16 NOTE — Patient Instructions (Addendum)
  Stop taking Lactulose on an every day basis. If you do not have a daily bowel movement please take a dose. Continue Prednisone 40mg  daily. On 03/29/14 you will need to start reducing dose of Prednisone.   Prednisone schedule:   On 03/29/14 take 30mg  of Prednisone x 5 days. On 04/08/13 reduce Prednisone to 20mg  daily x 5 days On 04/13/13 reduce Prednisone to 10mg  x 5 days On 04/18/14 reduce Prednisone to 5mg  x 3 days On 04/21/14 take 5mg  On 04/24/14 take 5mg  then you are done.   Get a Primary Care Physician.   Pam will call you with an appiontment to see Willette Cluster NP early February.

## 2014-03-16 NOTE — Telephone Encounter (Signed)
-----  Message from Willia Craze, NP sent at 03/16/2014  3:47 PM EST ----- Gary [name], potassium is very low. Please call [facility] and asked them give patient potassium 40 mEq now and then 20 mEq twice daily for 5 days. He needs a repeat BMET in 1 week. Please forward this to MD at [facility] as well.  Thanks  Notes: sodium low, on lasix. Bilirubin improving on steroids but alk phos and AST worse.  Will need further evaluation if remains elevated. Inpatient ultrasound suggests biliary sludge and hepatitis. Follow up appointment made for 3 weeks.

## 2014-03-16 NOTE — Telephone Encounter (Signed)
I spoke to Gary [name] at [facility].  I faxed a letter to her from [facility] with the potassium instrucitons and the labs from today.  I also included in the letter he nees labs, a BMET in 7 days.  I also advised they forward the labs to the MD there, Dr. Donette Larry.

## 2014-03-16 NOTE — Progress Notes (Signed)
HPI :  Patient is a 64 year old male who we recently met in the hospital for evaluation of abnormal LFTs in the setting of alcoholism. Patient was admitted with altered mental status and jaundice. Alcohol level on admission was 392. Acute hepatitis panel negative. Bili on admission was 26, alkaline phosphatase 437 , AST 360, ALP 120. Ultrasound the abdomen showed densely echogenic enlarged liver consistent with hepatitis. Small amount of sludge in the gallbladder, no biliary duct dilation. Hospitalist initiated steroid for alcoholic hepatitis. Within a couple of days the total bilirubin had declined to 17. His alkaline phosphatase had decreased by nearly half and transaminases had significantly improved as well.. Patient felt fine and wanted to go home. He was discharged to [facility]. He comes in today for a hospital follow-up. Patient's wife is from [place], she has been there for 4 months visiting their new grandchild. Patient had been drinking quite heavily in his wife's absence. He has no intentions of drinking upon discharge from [facility].  Past Medical History  Diagnosis Date  . Hypertension   . Arthritis   . Hypertension    Family History  Problem Relation Age of Onset  . Heart failure Father   . Colon cancer Neg Hx   . Colon polyps Neg Hx   . Diabetes Neg Hx   . Kidney disease Neg Hx   . Gallbladder disease Neg Hx   . Esophageal cancer Neg Hx    History  Substance Use Topics  . Smoking status: Never Smoker   . Smokeless tobacco: Never Used  . Alcohol Use: 4.8 oz/week    7 Cans of beer, 1 Shots of liquor per week     Comment: Pt stated does not drink now - 03/16/2014   Current Outpatient Prescriptions  Medication Sig Dispense Refill  . allopurinol (ZYLOPRIM) 100 MG tablet Take 100-200 mg by mouth daily. Take 119m for 15 days then increase to 2041mfor 15 days.    . colchicine 0.6 MG tablet Take 0.6 mg by mouth 2 (two) times daily.    . feeding supplement, ENSURE  COMPLETE, (ENSURE COMPLETE) LIQD Take 237 mLs by mouth 2 (two) times daily between meals. (Patient taking differently: Take 237 mLs by mouth 2 (two) times daily between meals. Pt uses Med Pass 90)    . furosemide (LASIX) 40 MG tablet Take 1 tablet (40 mg total) by mouth daily. USE FOR 3 DAYS THEN STOP. 3 tablet 0  . lactulose (CHRONULAC) 10 GM/15ML solution Take 45 mLs (30 g total) by mouth daily. 240 mL 0  . lisinopril (PRINIVIL,ZESTRIL) 20 MG tablet Take 20 mg by mouth daily.    . Multiple Vitamin (MULTIVITAMIN WITH MINERALS) TABS tablet Take 1 tablet by mouth daily.    . Omega-3 Fatty Acids (FISH OIL PO) Take 1 capsule by mouth daily.    . pantoprazole (PROTONIX) 40 MG tablet Take 1 tablet (40 mg total) by mouth daily at 6 (six) AM. 30 tablet 0  . potassium chloride SA (K-DUR,KLOR-CON) 20 MEQ tablet Take 2 tablets (40 mEq total) by mouth daily. Take for 4 days then stop. 8 tablet 0  . predniSONE (DELTASONE) 20 MG tablet Take 2 tablets (40 mg total) by mouth daily with breakfast. 90 tablet 0  . thiamine 100 MG tablet Take 1 tablet (100 mg total) by mouth daily.    . traMADol (ULTRAM) 50 MG tablet Take 1 tablet (50 mg total) by mouth every 12 (twelve) hours as needed. 20 tablet 0  No current facility-administered medications for this visit.   No Known Allergies   Review of Systems: Positive for arthritis, itching, and skin rash. All other systems reviewed and negative except where noted in HPI.    US Abdomen Complete  02/25/2014   CLINICAL DATA:  Elevated bilirubin, hepatitis  EXAM: ULTRASOUND ABDOMEN COMPLETE  COMPARISON:  None.  FINDINGS: Gallbladder: Gallbladder wall is mildly thickened. The gallbladder is nondistended. Small amount of sludge with in the gallbladder. No pericholecystic fluid. Negative sonographic Murphy's sign.  Common bile duct: Diameter: Normal at 3 mm.  Liver: The liver is enlarged and densely echogenic. No biliary duct dilatation is evident.  IVC: No abnormality  visualized.  Pancreas: Poorly visualized due to enlarged liver  Spleen: Size and appearance within normal limits.  Right Kidney: Length: 12.8 cm. Echogenicity within normal limits. No mass or hydronephrosis visualized.  Left Kidney: Length: 13.5 cm. Echogenicity within normal limits. No mass or hydronephrosis visualized.  Abdominal aorta: No aneurysm visualized.  Other findings: No ascites  IMPRESSION: 1. Densely echogenic enlarged liver is consistent with history of hepatocellular disease (hepatitis). 2. Gallbladder wall thickening without distension. Small sludge. Negative sonographic Murphy's sign. 3. No evidence of biliary obstruction.   Electronically Signed   By: Suzy Bouchard M.D.   On: 02/25/2014 20:32    Physical Exam: BP 114/60 mmHg  Pulse 82  Ht 5' 9"  (1.753 m)  Wt 198 lb 6 oz (89.982 kg)  BMI 29.28 kg/m2 Constitutional: Pleasant,well-developed, white male in no acute distress. HEENT: Normocephalic and atraumatic. Icteric sclerae Neck supple.  Cardiovascular: Normal rate, regular rhythm.  Pulmonary/chest: Effort normal and breath sounds normal. No wheezing, rales or rhonchi. Abdominal: limited exam, patient in wheelchair. Abdomen soft, protuberant ,nontender. Bowel sounds active throughout.  Extremities: 1-2+ BLE edema Lymphadenopathy: No cervical adenopathy noted. Neurological: Alert and oriented to person place and time. Mild asterixis but can answer basic math questions, spell backwards / forward.  Skin: Skin is warm and dry. No rashes noted. Psychiatric: Normal mood and affect. Behavior is normal.   ASSESSMENT AND PLAN:   64 year old male with recent admission for alcoholic hepatitis. He does seem to be responding to steroids. Ultrasound not suggestive of cirrhosis. No stigmata of chronic liver disease on exam. He is mildly coagulopathic and platelets are low but both could be attributed to acute alcoholism.   Normally alcoholic hepatitis treated with prednisolone but  prednisone had already been started. Continue 40 mg of prednisone daily for a total of 28 days. He will taper dosage following that (I typed out tapering schedule).   Check LFTs today  I will see the patient back in approximately 3 weeks.   He is having several loose bowel movements a day from lactulose. Advised patient to stop daily lactulose but use if he does not have a daily bowel movement.

## 2014-03-17 NOTE — Progress Notes (Signed)
Agree with Ms. Guenther's assessment and plan. Carl E. Gessner, MD, FACG   

## 2014-04-25 ENCOUNTER — Inpatient Hospital Stay (HOSPITAL_COMMUNITY)
Admission: EM | Admit: 2014-04-25 | Discharge: 2014-04-26 | DRG: 189 | Disposition: A | Discharge status: Home / Self Care | Payer: 59 | Attending: Internal Medicine | Admitting: Internal Medicine

## 2014-04-25 ENCOUNTER — Encounter (HOSPITAL_COMMUNITY): Payer: Self-pay | Admitting: Emergency Medicine

## 2014-04-25 ENCOUNTER — Inpatient Hospital Stay (HOSPITAL_COMMUNITY): Payer: 59

## 2014-04-25 ENCOUNTER — Emergency Department (HOSPITAL_COMMUNITY): Payer: 59

## 2014-04-25 DIAGNOSIS — M13872 Other specified arthritis, left ankle and foot: Secondary | ICD-10-CM | POA: Diagnosis present

## 2014-04-25 DIAGNOSIS — I10 Essential (primary) hypertension: Secondary | ICD-10-CM | POA: Diagnosis present

## 2014-04-25 DIAGNOSIS — E876 Hypokalemia: Secondary | ICD-10-CM | POA: Diagnosis present

## 2014-04-25 DIAGNOSIS — R0602 Shortness of breath: Secondary | ICD-10-CM | POA: Diagnosis present

## 2014-04-25 DIAGNOSIS — Z79899 Other long term (current) drug therapy: Secondary | ICD-10-CM

## 2014-04-25 DIAGNOSIS — Z7952 Long term (current) use of systemic steroids: Secondary | ICD-10-CM

## 2014-04-25 DIAGNOSIS — F1010 Alcohol abuse, uncomplicated: Secondary | ICD-10-CM | POA: Diagnosis present

## 2014-04-25 DIAGNOSIS — Z8249 Family history of ischemic heart disease and other diseases of the circulatory system: Secondary | ICD-10-CM

## 2014-04-25 DIAGNOSIS — K7460 Unspecified cirrhosis of liver: Secondary | ICD-10-CM | POA: Diagnosis present

## 2014-04-25 DIAGNOSIS — K7030 Alcoholic cirrhosis of liver without ascites: Secondary | ICD-10-CM | POA: Insufficient documentation

## 2014-04-25 DIAGNOSIS — E8770 Fluid overload, unspecified: Secondary | ICD-10-CM | POA: Diagnosis present

## 2014-04-25 DIAGNOSIS — K7011 Alcoholic hepatitis with ascites: Secondary | ICD-10-CM | POA: Diagnosis present

## 2014-04-25 DIAGNOSIS — M13871 Other specified arthritis, right ankle and foot: Secondary | ICD-10-CM | POA: Diagnosis present

## 2014-04-25 DIAGNOSIS — Z23 Encounter for immunization: Secondary | ICD-10-CM

## 2014-04-25 DIAGNOSIS — L538 Other specified erythematous conditions: Secondary | ICD-10-CM | POA: Diagnosis present

## 2014-04-25 DIAGNOSIS — F1011 Alcohol abuse, in remission: Secondary | ICD-10-CM | POA: Diagnosis present

## 2014-04-25 DIAGNOSIS — M25572 Pain in left ankle and joints of left foot: Secondary | ICD-10-CM | POA: Diagnosis present

## 2014-04-25 DIAGNOSIS — R21 Rash and other nonspecific skin eruption: Secondary | ICD-10-CM | POA: Diagnosis present

## 2014-04-25 DIAGNOSIS — R000 Tachycardia, unspecified: Secondary | ICD-10-CM | POA: Diagnosis present

## 2014-04-25 DIAGNOSIS — R188 Other ascites: Secondary | ICD-10-CM | POA: Diagnosis present

## 2014-04-25 DIAGNOSIS — D6959 Other secondary thrombocytopenia: Secondary | ICD-10-CM | POA: Diagnosis present

## 2014-04-25 DIAGNOSIS — R601 Generalized edema: Secondary | ICD-10-CM

## 2014-04-25 DIAGNOSIS — R600 Localized edema: Secondary | ICD-10-CM | POA: Diagnosis present

## 2014-04-25 DIAGNOSIS — J810 Acute pulmonary edema: Principal | ICD-10-CM | POA: Diagnosis present

## 2014-04-25 DIAGNOSIS — M25579 Pain in unspecified ankle and joints of unspecified foot: Secondary | ICD-10-CM

## 2014-04-25 DIAGNOSIS — K219 Gastro-esophageal reflux disease without esophagitis: Secondary | ICD-10-CM | POA: Insufficient documentation

## 2014-04-25 DIAGNOSIS — M130 Polyarthritis, unspecified: Secondary | ICD-10-CM | POA: Insufficient documentation

## 2014-04-25 HISTORY — DX: Other ascites: R18.8

## 2014-04-25 LAB — CBC
HCT: 39.5 % (ref 39.0–52.0)
Hemoglobin: 13.1 g/dL (ref 13.0–17.0)
MCH: 33.9 pg (ref 26.0–34.0)
MCHC: 33.2 g/dL (ref 30.0–36.0)
MCV: 102.3 fL — ABNORMAL HIGH (ref 78.0–100.0)
PLATELETS: 143 10*3/uL — AB (ref 150–400)
RBC: 3.86 MIL/uL — ABNORMAL LOW (ref 4.22–5.81)
RDW: 13.2 % (ref 11.5–15.5)
WBC: 5.3 10*3/uL (ref 4.0–10.5)

## 2014-04-25 LAB — COMPREHENSIVE METABOLIC PANEL
ALBUMIN: 2.8 g/dL — AB (ref 3.5–5.2)
ALK PHOS: 107 U/L (ref 39–117)
ALT: 16 U/L (ref 0–53)
AST: 36 U/L (ref 0–37)
Anion gap: 4 — ABNORMAL LOW (ref 5–15)
BILIRUBIN TOTAL: 1.8 mg/dL — AB (ref 0.3–1.2)
CHLORIDE: 111 mmol/L (ref 96–112)
CO2: 24 mmol/L (ref 19–32)
CREATININE: 0.88 mg/dL (ref 0.50–1.35)
Calcium: 8.7 mg/dL (ref 8.4–10.5)
GFR calc Af Amer: >90 mL/min (ref >90)
GFR calc non Af Amer: 90 mL/min — ABNORMAL LOW (ref >90)
Glucose, Bld: 96 mg/dL (ref 70–99)
POTASSIUM: 3.1 mmol/L — AB (ref 3.5–5.1)
Sodium: 139 mmol/L (ref 135–145)
TOTAL PROTEIN: 5.8 g/dL — AB (ref 6.0–8.3)

## 2014-04-25 LAB — LIPASE, BLOOD: Lipase: 64 U/L — ABNORMAL HIGH (ref 11–59)

## 2014-04-25 LAB — URINALYSIS, ROUTINE W REFLEX MICROSCOPIC
Bilirubin Urine: NEGATIVE
Glucose, UA: NEGATIVE mg/dL
Hgb urine dipstick: NEGATIVE
Ketones, ur: NEGATIVE mg/dL
LEUKOCYTES UA: NEGATIVE
NITRITE: NEGATIVE
PROTEIN: NEGATIVE mg/dL
Specific Gravity, Urine: 1.009 (ref 1.005–1.030)
UROBILINOGEN UA: 0.2 mg/dL (ref 0.0–1.0)
pH: 7 (ref 5.0–8.0)

## 2014-04-25 LAB — HEPATIC FUNCTION PANEL
ALK PHOS: 108 U/L (ref 39–117)
ALT: 17 U/L (ref 0–53)
AST: 36 U/L (ref 0–37)
Albumin: 2.8 g/dL — ABNORMAL LOW (ref 3.5–5.2)
BILIRUBIN INDIRECT: 1 mg/dL — AB (ref 0.3–0.9)
BILIRUBIN TOTAL: 1.8 mg/dL — AB (ref 0.3–1.2)
Bilirubin, Direct: 0.8 mg/dL — ABNORMAL HIGH (ref 0.0–0.5)
TOTAL PROTEIN: 5.9 g/dL — AB (ref 6.0–8.3)

## 2014-04-25 LAB — PROTIME-INR
INR: 1.26 (ref 0.00–1.49)
INR: 1.33 (ref 0.00–1.49)
PROTHROMBIN TIME: 16.6 s — AB (ref 11.6–15.2)
Prothrombin Time: 15.9 seconds — ABNORMAL HIGH (ref 11.6–15.2)

## 2014-04-25 LAB — URIC ACID: Uric Acid, Serum: 5.3 mg/dL (ref 4.0–7.8)

## 2014-04-25 LAB — ETHANOL: Alcohol, Ethyl (B): <5 mg/dL (ref 0–9)

## 2014-04-25 LAB — MAGNESIUM: Magnesium: 1.8 mg/dL (ref 1.5–2.5)

## 2014-04-25 LAB — I-STAT TROPONIN, ED: TROPONIN I, POC: 0.01 ng/mL (ref 0.00–0.08)

## 2014-04-25 LAB — TSH: TSH: 1.491 u[IU]/mL (ref 0.350–4.500)

## 2014-04-25 LAB — BRAIN NATRIURETIC PEPTIDE: B NATRIURETIC PEPTIDE 5: 379.1 pg/mL — AB (ref 0.0–100.0)

## 2014-04-25 MED ORDER — METOPROLOL TARTRATE 12.5 MG HALF TABLET
12.5000 mg | ORAL_TABLET | Freq: Two times a day (BID) | ORAL | Status: DC
  Administered 2014-04-25 – 2014-04-26 (×3): 12.5 mg via ORAL
  Filled 2014-04-25 (×4): qty 1

## 2014-04-25 MED ORDER — ACETAMINOPHEN 650 MG RE SUPP: 650.0000 mg | Freq: Four times a day (QID) | RECTAL | Status: DC | PRN

## 2014-04-25 MED ORDER — ACETAMINOPHEN 325 MG PO TABS: 650.0000 mg | ORAL_TABLET | Freq: Four times a day (QID) | ORAL | Status: DC | PRN

## 2014-04-25 MED ORDER — FUROSEMIDE 10 MG/ML IJ SOLN
40.0000 mg | Freq: Every day | INTRAMUSCULAR | Status: DC
  Administered 2014-04-25 – 2014-04-26 (×2): 40 mg via INTRAVENOUS
  Filled 2014-04-25 (×3): qty 4

## 2014-04-25 MED ORDER — PNEUMOCOCCAL VAC POLYVALENT 25 MCG/0.5ML IJ INJ
0.5000 mL | INJECTION | INTRAMUSCULAR | Status: AC
  Administered 2014-04-26: 0.5 mL via INTRAMUSCULAR
  Filled 2014-04-25: qty 0.5

## 2014-04-25 MED ORDER — METHYLPREDNISOLONE SODIUM SUCC 125 MG IJ SOLR
80.0000 mg | Freq: Two times a day (BID) | INTRAMUSCULAR | Status: DC
  Administered 2014-04-25 – 2014-04-26 (×3): 80 mg via INTRAVENOUS
  Filled 2014-04-25 (×3): qty 1.28
  Filled 2014-04-25: qty 2

## 2014-04-25 MED ORDER — COLCHICINE 0.6 MG PO TABS
0.6000 mg | ORAL_TABLET | Freq: Every day | ORAL | Status: DC
  Administered 2014-04-25 – 2014-04-26 (×2): 0.6 mg via ORAL
  Filled 2014-04-25 (×2): qty 1

## 2014-04-25 MED ORDER — IOHEXOL 350 MG/ML SOLN
100.0000 mL | Freq: Once | INTRAVENOUS | Status: AC | PRN
  Administered 2014-04-25: 100 mL via INTRAVENOUS

## 2014-04-25 MED ORDER — INFLUENZA VAC SPLIT QUAD 0.5 ML IM SUSY
0.5000 mL | PREFILLED_SYRINGE | INTRAMUSCULAR | Status: AC
  Administered 2014-04-26: 0.5 mL via INTRAMUSCULAR
  Filled 2014-04-25: qty 0.5

## 2014-04-25 MED ORDER — SODIUM CHLORIDE 0.9 % IV BOLUS (SEPSIS)
1000.0000 mL | Freq: Once | INTRAVENOUS | Status: AC
  Administered 2014-04-25: 1000 mL via INTRAVENOUS

## 2014-04-25 MED ORDER — INDOMETHACIN ER 75 MG PO CPCR
75.0000 mg | ORAL_CAPSULE | Freq: Two times a day (BID) | ORAL | Status: DC
  Administered 2014-04-25 – 2014-04-26 (×2): 75 mg via ORAL
  Filled 2014-04-25 (×5): qty 1

## 2014-04-25 MED ORDER — MORPHINE SULFATE 2 MG/ML IJ SOLN: 1.0000 mg | INTRAMUSCULAR | Status: DC | PRN

## 2014-04-25 MED ORDER — PANTOPRAZOLE SODIUM 40 MG PO TBEC
40.0000 mg | DELAYED_RELEASE_TABLET | Freq: Every day | ORAL | Status: DC
  Administered 2014-04-26: 40 mg via ORAL
  Filled 2014-04-25: qty 1

## 2014-04-25 MED ORDER — HEPARIN SODIUM (PORCINE) 5000 UNIT/ML IJ SOLN
5000.0000 [IU] | Freq: Three times a day (TID) | INTRAMUSCULAR | Status: DC
  Administered 2014-04-25 – 2014-04-26 (×4): 5000 [IU] via SUBCUTANEOUS
  Filled 2014-04-25 (×4): qty 1

## 2014-04-25 MED ORDER — ADULT MULTIVITAMIN W/MINERALS CH
1.0000 | ORAL_TABLET | Freq: Every day | ORAL | Status: DC
  Administered 2014-04-25 – 2014-04-26 (×2): 1 via ORAL
  Filled 2014-04-25 (×2): qty 1

## 2014-04-25 MED ORDER — SODIUM CHLORIDE 0.9 % IV BOLUS (SEPSIS): 1000.0000 mL | Freq: Once | INTRAVENOUS | Status: DC

## 2014-04-25 MED ORDER — ALLOPURINOL 100 MG PO TABS
100.0000 mg | ORAL_TABLET | Freq: Every day | ORAL | Status: DC
  Administered 2014-04-25 – 2014-04-26 (×2): 100 mg via ORAL
  Filled 2014-04-25 (×2): qty 1

## 2014-04-25 MED ORDER — HYDROCODONE-ACETAMINOPHEN 5-325 MG PO TABS: 1.0000 | ORAL_TABLET | ORAL | Status: DC | PRN

## 2014-04-25 MED ORDER — POTASSIUM CHLORIDE CRYS ER 20 MEQ PO TBCR
40.0000 meq | EXTENDED_RELEASE_TABLET | Freq: Four times a day (QID) | ORAL | Status: AC
  Administered 2014-04-25 (×2): 40 meq via ORAL
  Filled 2014-04-25: qty 2

## 2014-04-25 MED ORDER — SODIUM CHLORIDE 0.9 % IJ SOLN
3.0000 mL | Freq: Two times a day (BID) | INTRAMUSCULAR | Status: DC
  Administered 2014-04-25 – 2014-04-26 (×3): 3 mL via INTRAVENOUS
  Filled 2014-04-25: qty 3

## 2014-04-25 NOTE — H&P (Signed)
[facility] History and Physical  Gary [name] MHD:[id number] DOB: [birth year] DOA: 04/25/2014  Referring physician: EDP PCP: [name], MD   Chief Complaint: Shortness of breath and pain everywhere  HPI: Gary [name] is a 64 y.o. male with past medical history of recent episode of severe alcoholic hepatitis with discharged from the hospital about a month ago, history of alcohol abuse, patient came into the hospital complaining about shortness of breath and generalized aches and pains. Patient reported after he was discharged from the hospital he went to SNF, stay there for 3-4 weeks and then discharge home, since he got home he was not able to ambulate very well, having severe pain in both his ankles, as well as right hip. Patient also have severe shortness of breath, came in today for further evaluation. In the ED CT angiography was done and showed no evidence of PE, patient was tachycardic given 1 L of fluids, total bilirubin is 1.8, hypokalemic with potassium of 3.1. Patient admitted to the hospital for further evaluation of shortness of breath, tachycardia and bilateral ankle pain. Patient also has papular macular erythematous rash covering his torso, reported this gone on for the past 2 weeks.  Review of Systems:  Constitutional: Gen. aches and pains. Eyes: negative for irritation, redness and visual disturbance Ears, nose, mouth, throat, and face: negative for earaches, epistaxis, nasal congestion and sore throat Respiratory: negative for cough, dyspnea on exertion, sputum and wheezing Cardiovascular: negative for chest pain, dyspnea, lower extremity edema, orthopnea, palpitations and syncope Gastrointestinal: negative for abdominal pain, constipation, diarrhea, melena, nausea and vomiting Genitourinary:negative for dysuria, frequency and hematuria Hematologic/lymphatic: negative for bleeding, easy bruising and lymphadenopathy Musculoskeletal:negative for arthralgias, muscle  weakness and stiff joints Neurological: negative for coordination problems, gait problems, headaches and weakness Endocrine: negative for diabetic symptoms including polydipsia, polyuria and weight loss Allergic/Immunologic: negative for anaphylaxis, hay fever and urticaria  Past Medical History  Diagnosis Date  . Hypertension   . Arthritis   . Hypertension   . Alcohol abuse    Past Surgical History  Procedure Laterality Date  . No past surgeries     Social History:   reports that he has never smoked. He has never used smokeless tobacco. He reports that he drinks about 4.8 oz of alcohol per week. He reports that he does not use illicit drugs.  No Known Allergies  Family History  Problem Relation Age of Onset  . Heart failure Father   . Colon cancer Neg Hx   . Colon polyps Neg Hx   . Diabetes Neg Hx   . Kidney disease Neg Hx   . Gallbladder disease Neg Hx   . Esophageal cancer Neg Hx     Prior to Admission medications   Medication Sig Start Date End Date Taking? Authorizing Provider  feeding supplement, ENSURE COMPLETE, (ENSURE COMPLETE) LIQD Take 237 mLs by mouth 2 (two) times daily between meals. Patient taking differently: Take 237 mLs by mouth 2 (two) times daily between meals. Pt uses Med Pass 90 03/04/14  Yes Rodolph Bong, MD  folic acid (FOLVITE) 1 MG tablet Take 1 mg by mouth daily.   Yes Historical Provider, MD  Vitamin D, Ergocalciferol, (DRISDOL) 50000 UNITS CAPS capsule Take 50,000 Units by mouth every 30 (thirty) days.   Yes Historical Provider, MD  allopurinol (ZYLOPRIM) 100 MG tablet Take 100-200 mg by mouth daily. Take 100mg  for 15 days then increase to 200mg  for 15 days.    Historical Provider, MD  colchicine 0.6 MG tablet Take 0.6 mg by mouth 2 (two) times daily.    Historical Provider, MD  furosemide (LASIX) 40 MG tablet Take 1 tablet (40 mg total) by mouth daily. USE FOR 3 DAYS THEN STOP. Patient not taking: Reported on 04/25/2014 03/04/14   Rodolph Bong, MD  lactulose (CHRONULAC) 10 GM/15ML solution Take 45 mLs (30 g total) by mouth daily. Patient not taking: Reported on 04/25/2014 03/04/14   Rodolph Bong, MD  lisinopril (PRINIVIL,ZESTRIL) 20 MG tablet Take 20 mg by mouth daily.    Historical Provider, MD  Multiple Vitamin (MULTIVITAMIN WITH MINERALS) TABS tablet Take 1 tablet by mouth daily. Patient not taking: Reported on 04/25/2014 03/04/14   Rodolph Bong, MD  Omega-3 Fatty Acids (FISH OIL PO) Take 1 capsule by mouth daily.    Historical Provider, MD  pantoprazole (PROTONIX) 40 MG tablet Take 1 tablet (40 mg total) by mouth daily at 6 (six) AM. Patient not taking: Reported on 04/25/2014 03/04/14   Rodolph Bong, MD  potassium chloride SA (K-DUR,KLOR-CON) 20 MEQ tablet Take 2 tablets (40 mEq total) by mouth daily. Take for 4 days then stop. Patient not taking: Reported on 03/16/2014 03/04/14   Rodolph Bong, MD  predniSONE (DELTASONE) 10 MG tablet Take 1 tablet (10 mg total) by mouth daily with breakfast. Patient not taking: Reported on 04/25/2014 03/16/14   Meredith Pel, NP  predniSONE (DELTASONE) 20 MG tablet Take 2 tablets (40 mg total) by mouth daily with breakfast. Patient not taking: Reported on 04/25/2014 03/04/14   Rodolph Bong, MD  thiamine 100 MG tablet Take 1 tablet (100 mg total) by mouth daily. Patient not taking: Reported on 04/25/2014 03/04/14   Rodolph Bong, MD  traMADol (ULTRAM) 50 MG tablet Take 1 tablet (50 mg total) by mouth every 12 (twelve) hours as needed. Patient not taking: Reported on 04/25/2014 03/04/14   Rodolph Bong, MD   Physical Exam: Filed Vitals:   04/25/14 0858  BP: 129/84  Pulse: 76  Temp:   Resp: 18   Constitutional: Oriented to person, place, and time. Well-developed and well-nourished. Cooperative.  Head: Normocephalic and atraumatic.  Nose: Nose normal.  Mouth/Throat: Uvula is midline, oropharynx is clear and moist and mucous membranes are normal.  Eyes: Conjunctivae and EOM  are normal. Pupils are equal, round, and reactive to light.  Neck: Trachea normal and normal range of motion. Neck supple.  Cardiovascular: Normal rate, regular rhythm, S1 normal, S2 normal, normal heart sounds and intact distal pulses.   Pulmonary/Chest: Effort normal and breath sounds normal.  Abdominal: Soft. Bowel sounds are normal. There is no hepatosplenomegaly. There is no tenderness.  Musculoskeletal: +1 bilateral pedal edema.  Neurological: Alert and oriented to person, place, and time. Has normal strength. No cranial nerve deficit or sensory deficit.  Skin: Erythematous maculopapular rash involving his abdomen and back  Psychiatric: Has a normal mood and affect. Speech is normal and behavior is normal.   Labs on Admission:  Basic Metabolic Panel:  Recent Labs Lab 04/25/14 0434 04/25/14 0454  NA  --  139  K  --  3.1*  CL  --  111  CO2  --  24  GLUCOSE  --  96  BUN  --  <5*  CREATININE  --  0.88  CALCIUM  --  8.7  MG 1.8  --    Liver Function Tests:  Recent Labs Lab 04/25/14 0434 04/25/14 0454  AST 36 36  ALT 17 16  ALKPHOS 108 107  BILITOT 1.8* 1.8*  PROT 5.9* 5.8*  ALBUMIN 2.8* 2.8*    Recent Labs Lab 04/25/14 0434  LIPASE 64*   No results for input(s): AMMONIA in the last 168 hours. CBC:  Recent Labs Lab 04/25/14 0454  WBC 5.3  HGB 13.1  HCT 39.5  MCV 102.3*  PLT 143*   Cardiac Enzymes: No results for input(s): CKTOTAL, CKMB, CKMBINDEX, TROPONINI in the last 168 hours.  BNP (last 3 results) No results for input(s): BNP in the last 8760 hours.  ProBNP (last 3 results) No results for input(s): PROBNP in the last 8760 hours.  CBG: No results for input(s): GLUCAP in the last 168 hours.  Radiological Exams on Admission: Ct Angio Chest Pe W/cm &/or Wo Cm  04/25/2014   CLINICAL DATA:  Knot in the middle of chest. Very short of breath. Evaluate for pulmonary embolism.  EXAM: CT ANGIOGRAPHY CHEST WITH CONTRAST  TECHNIQUE: Multidetector CT  imaging of the chest was performed using the standard protocol during bolus administration of intravenous contrast. Multiplanar CT image reconstructions and MIPs were obtained to evaluate the vascular anatomy.  CONTRAST:  OMNIPAQUE IOHEXOL 350 MG/ML SOLN  COMPARISON:  Chest radiograph - 04/25/2014  FINDINGS: Vascular Findings:  There is suboptimal opacification of the pulmonary arterial system with the main pulmonary artery measuring only 213 Hounsfield units. Given this limitation, there are no discrete filling defects within the pulmonary arterial tree to the level of the bilateral subsegmental pulmonary arteries. Evaluation of the distal subsegmental pulmonary arteries is degraded secondary to suboptimal vessel opacification.  Normal caliber of the main pulmonary artery.  Normal heart size. No pericardial effusion. Coronary artery calcifications.  There is mild fusiform ectasia of the ascending thoracic aorta measuring approximately 39 mm in greatest oblique coronal dimension. Scattered atherosclerotic plaque within the aortic arch and descending thoracic aorta, not resulting in hemodynamically significant stenosis. Bovine configuration of the aortic arch is incidentally noted. Branch vessels of the aortic arch appear widely patent without a hemodynamically significant stenosis. No thoracic aortic dissection or periaortic stranding on this nongated examination.  Review of the MIP images confirms the above findings.   ----------------------------------------------------------------------------------  Nonvascular Findings:  There is minimal dependent subpleural ground-glass atelectasis. No discrete focal airspace opacities. No pleural effusion or pneumothorax. The central pulmonary airways appear widely patent.  No discrete pulmonary nodules. Scattered shotty mediastinal lymph nodes are individually not enlarged by size criteria with index sub- carinal lymph node measuring 0.7 cm in greatest short axis  diameter (image 39, series 4) and index precarinal lymph node measuring 0.9 cm (image 33). No mediastinal, hilar or axillary lymphadenopathy.  Limited early arterial phase evaluation of the upper abdomen demonstrates an approximately 1.2 cm hypo attenuating (11 Hounsfield unit) cyst within the dome of the lateral segment of the left lobe of the liver (image 78, series 4). Additional scattered sub cm hypoattenuating hepatic lesions are too small to adequately characterize though favored to represent additional hepatic cysts.  There is mild nodularity of the hepatic contour with recanalization of the periumbilical vein (representative image 88, series 4). There is a trace amount of perihepatic (image 81, series 4) and perisplenic (image 83) fluid within the image upper abdomen.  No acute or aggressive osseous abnormalities. Old/healed fractures involving the posterior aspects of the right third through eighth ribs.  IMPRESSION: 1. No acute cardiopulmonary disease. Specifically, no evidence of pulmonary embolism to the level of the bilateral subsegmental pulmonary arteries. 2.  Coronary artery calcifications. 3. Fusiform ectasia of the ascending thoracic aorta measuring 39 mm in diameter. No evidence of thoracic aortic dissection on this nongated examination. 4. Suspected cirrhosis and portal venous hypertension including trace amount of perihepatic and perisplenic fluid and re- cannulization of the periumbilical vein, incompletely evaluated on this chest CT. Correlation with LFTs is recommended.   Electronically Signed   By: Simonne Come M.D.   On: 04/25/2014 07:46   Dg Chest Port 1 View  04/25/2014   CLINICAL DATA:  Chest pain and shortness of breath.  Tachycardia.  EXAM: PORTABLE CHEST - 1 VIEW  COMPARISON:  02/26/2014  FINDINGS: Lower lung volumes from prior accentuating the cardiac silhouette and bronchovascular markings. No confluent airspace disease. No large pleural effusion or pneumothorax. Old right-sided  rib fractures are unchanged.  IMPRESSION: Low lung volumes without acute process.   Electronically Signed   By: Rubye Oaks M.D.   On: 04/25/2014 05:28    EKG: Independently reviewed. Sinus tachycardia with heart rate is 140  Assessment/Plan Principal Problem:   Shortness of breath Active Problems:   History of alcohol abuse   Lower extremity edema   Ascites   Rash   Left ankle pain    SOB Reported shortness of breath with some cough, CT angiography was done showed no evidence of PE or pneumonia. Unclear etiology, patient has mild to moderate fluid overload but clear lungs. Patient is back to baseline, no increased oxygen requirement or productive cough. Anyway patient will be on Lasix, if shortness of breath come back again repeat chest x-ray.  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 Cannot bear weight on the left ankle, check uric acid, check left ankle x-ray Patient is on colchicine, allopurinol and prednisone already, added indomethacin. Start prednisone and started Solu-Medrol. PT/OT to evaluate and treat.  Rash Erythematous maculopapular rash involving the torso, patient denies any recent change in medications. Already on prednisone, steroids switched to Solu-Medrol.  Recent severe alcoholic hepatitis Patient presented on 02/25/2014 with total bilirubin of 26.3 and altered mental status. Was negative for viral hepatitis, currently total bilirubin is 1.8, improving. Is to be improving with steroids, continue steroids, check CMP in a.m. CT angiography of the chest suggesting cirrhosis, patient needs to follow-up with GI as outpatient to ensure resolution of hepatitis.  Code Status: Full code Family Communication: Plan discussed with the patient Disposition Plan: Telemetry, inpatient  Time spent: 70 minutes  [facility] Pager [phone]

## 2014-04-25 NOTE — ED Notes (Signed)
MD at bedside. 

## 2014-04-25 NOTE — ED Notes (Signed)
Pt reports he called his doctor and informed them he was feeling worse, very SOB. Pt instructed to call 911. EMS reports HR of 145-150. Pt denies chest pain.

## 2014-04-25 NOTE — ED Provider Notes (Signed)
CSN: [id number]     Arrival date & time 04/25/14  0448 History   First MD Initiated Contact with Patient 04/25/14 [phone]     Chief Complaint  Patient presents with  . Tachycardia  . Shortness of Breath     (Consider location/radiation/quality/duration/timing/severity/associated sxs/prior Treatment) Patient is a 64 y.o. male presenting with shortness of breath. The history is provided by the patient.  Shortness of Breath Severity:  Moderate Onset quality:  Gradual Timing:  Constant Progression:  Worsening Chronicity:  New Context: activity   Relieved by:  Nothing Worsened by:  Nothing tried Ineffective treatments:  None tried Associated symptoms: no cough, no fever, no neck pain, no swollen glands and no wheezing   Risk factors: no obesity     Past Medical History  Diagnosis Date  . Hypertension   . Arthritis   . Hypertension   . Alcohol abuse    Past Surgical History  Procedure Laterality Date  . No past surgeries     Family History  Problem Relation Age of Onset  . Heart failure Father   . Colon cancer Neg Hx   . Colon polyps Neg Hx   . Diabetes Neg Hx   . Kidney disease Neg Hx   . Gallbladder disease Neg Hx   . Esophageal cancer Neg Hx    History  Substance Use Topics  . Smoking status: Never Smoker   . Smokeless tobacco: Never Used  . Alcohol Use: 4.8 oz/week    7 Cans of beer, 1 Shots of liquor per week     Comment: Pt stated does not drink now - 03/16/2014    Review of Systems  Constitutional: Negative for fever.  Respiratory: Positive for shortness of breath. Negative for cough and wheezing.   Cardiovascular: Positive for palpitations and leg swelling.  Musculoskeletal: Negative for neck pain.  All other systems reviewed and are negative.     Allergies  Review of patient's allergies indicates no known allergies.  Home Medications   Prior to Admission medications   Medication Sig Start Date End Date Taking? Authorizing Provider  feeding  supplement, ENSURE COMPLETE, (ENSURE COMPLETE) LIQD Take 237 mLs by mouth 2 (two) times daily between meals. Patient taking differently: Take 237 mLs by mouth 2 (two) times daily between meals. Pt uses [facility] 90 03/04/14  Yes Rodolph Bong, MD  folic acid (FOLVITE) 1 MG tablet Take 1 mg by mouth daily.   Yes Historical Provider, MD  Vitamin D, Ergocalciferol, (DRISDOL) 50000 UNITS CAPS capsule Take 50,000 Units by mouth every 30 (thirty) days.   Yes Historical Provider, MD  allopurinol (ZYLOPRIM) 100 MG tablet Take 100-200 mg by mouth daily. Take  for 15 days then increase to  for 15 days.    Historical Provider, MD  colchicine 0.6 MG tablet Take 0.6 mg by mouth 2 (two) times daily.    Historical Provider, MD  furosemide (LASIX) 40 MG tablet Take 1 tablet (40 mg total) by mouth daily. USE FOR 3 DAYS THEN STOP. Patient not taking: Reported on 04/25/2014 03/04/14   Rodolph Bong, MD  lactulose (CHRONULAC) 10 GM/15ML solution Take 45 mLs (30 g total) by mouth daily. Patient not taking: Reported on 04/25/2014 03/04/14   Rodolph Bong, MD  lisinopril (PRINIVIL,ZESTRIL) 20 MG tablet Take 20 mg by mouth daily.    Historical Provider, MD  Multiple Vitamin (MULTIVITAMIN WITH MINERALS) TABS tablet Take 1 tablet by mouth daily. Patient not taking: Reported on 04/25/2014 03/04/14  Rodolph Bong, MD  Omega-3 Fatty Acids (FISH OIL PO) Take 1 capsule by mouth daily.    Historical Provider, MD  pantoprazole (PROTONIX) 40 MG tablet Take 1 tablet (40 mg total) by mouth daily at 6 (six) AM. Patient not taking: Reported on 04/25/2014 03/04/14   Rodolph Bong, MD  potassium chloride SA (K-DUR,KLOR-CON) 20 MEQ tablet Take 2 tablets (40 mEq total) by mouth daily. Take for 4 days then stop. Patient not taking: Reported on 03/16/2014 03/04/14   Rodolph Bong, MD  predniSONE (DELTASONE) 10 MG tablet Take 1 tablet (10 mg total) by mouth daily with breakfast. Patient not taking: Reported on 04/25/2014  03/16/14   Meredith Pel, NP  predniSONE (DELTASONE) 20 MG tablet Take 2 tablets (40 mg total) by mouth daily with breakfast. Patient not taking: Reported on 04/25/2014 03/04/14   Rodolph Bong, MD  thiamine 100 MG tablet Take 1 tablet (100 mg total) by mouth daily. Patient not taking: Reported on 04/25/2014 03/04/14   Rodolph Bong, MD  traMADol (ULTRAM) 50 MG tablet Take 1 tablet (50 mg total) by mouth every 12 (twelve) hours as needed. Patient not taking: Reported on 04/25/2014 03/04/14   Rodolph Bong, MD   BP 130/83 mmHg  Pulse 85  Temp(Src) 98.6 F (37 C) (Oral)  Resp 19  Ht 5\' 10"  (1.778 m)  Wt 190 lb (86.183 kg)  BMI 27.26 kg/m2  SpO2 98% Physical Exam  Constitutional: He is oriented to person, place, and time. He appears well-developed and well-nourished.  HENT:  Head: Normocephalic and atraumatic.  Mouth/Throat: Oropharynx is clear and moist.  Eyes: Conjunctivae are normal. Pupils are equal, round, and reactive to light. Scleral icterus is present.  Neck: Normal range of motion. Neck supple.  Cardiovascular: Normal rate, regular rhythm and intact distal pulses.   Pulmonary/Chest: Effort normal and breath sounds normal. No stridor. No respiratory distress. He has no wheezes. He has no rales.  Abdominal: Soft. He exhibits distension. There is no tenderness. There is no rebound and no guarding.  Musculoskeletal: He exhibits edema.  Lymphadenopathy:    He has no cervical adenopathy.  Neurological: He is alert and oriented to person, place, and time. He has normal reflexes.  Skin: Skin is warm and dry. He is not diaphoretic.  Psychiatric: He has a normal mood and affect.    ED Course  Procedures (including critical care time) Labs Review Labs Reviewed  CBC - Abnormal; Notable for the following:    RBC 3.86 (*)    MCV 102.3 (*)    Platelets 143 (*)    All other components within normal limits  COMPREHENSIVE METABOLIC PANEL - Abnormal; Notable for the following:     Potassium 3.1 (*)    BUN <5 (*)    Total Protein 5.8 (*)    Albumin 2.8 (*)    Total Bilirubin 1.8 (*)    GFR calc non Af Amer 90 (*)    Anion gap 4 (*)    All other components within normal limits  PROTIME-INR - Abnormal; Notable for the following:    Prothrombin Time 15.9 (*)    All other components within normal limits  LIPASE, BLOOD - Abnormal; Notable for the following:    Lipase 64 (*)    All other components within normal limits  HEPATIC FUNCTION PANEL - Abnormal; Notable for the following:    Total Protein 5.9 (*)    Albumin 2.8 (*)    Total Bilirubin  1.8 (*)    Bilirubin, Direct 0.8 (*)    Indirect Bilirubin 1.0 (*)    All other components within normal limits  BRAIN NATRIURETIC PEPTIDE  MAGNESIUM  I-STAT TROPOININ, ED    Imaging Review Dg Chest Port 1 View  04/25/2014   CLINICAL DATA:  Chest pain and shortness of breath.  Tachycardia.  EXAM: PORTABLE CHEST - 1 VIEW  COMPARISON:  02/26/2014  FINDINGS: Lower lung volumes from prior accentuating the cardiac silhouette and bronchovascular markings. No confluent airspace disease. No large pleural effusion or pneumothorax. Old right-sided rib fractures are unchanged.  IMPRESSION: Low lung volumes without acute process.   Electronically Signed   By: Rubye Oaks M.D.   On: 04/25/2014 05:28     EKG Interpretation   Date/Time:  Tuesday April 25 2014 04:55:04 EST Ventricular Rate:  148 PR Interval:  204 QRS Duration: 94 QT Interval:  327 QTC Calculation: 513 R Axis:   -78 Text Interpretation:  sinus tachycardiac Prolonged QT interval Confirmed  by [facility]  MD, Mandela Bello ([id number]) on 04/25/2014 5:56:41 AM      MDM   Final diagnoses:  None     EKG Interpretation  Date/Time:  Tuesday April 25 2014 04:55:04 EST Ventricular Rate:  148 PR Interval:  204 QRS Duration: 94 QT Interval:  327 QTC Calculation: 513 R Axis:   -78 Text Interpretation:  sinus tachycardiac Prolonged QT interval Confirmed by  [facility]  MD, Raychelle Hudman ([id number]) on 04/25/2014 5:56:41 AM         [place] Smitty Cords, MD 04/25/14 [phone]

## 2014-04-25 NOTE — Evaluation (Addendum)
Occupational Therapy Evaluation Patient Details Name: Gary [name] MRN: [id number] DOB: 1950/09/24 Today's Date: 04/25/2014    History of Present Illness 64 y.o. male with past medical history of recent episode of severe alcoholic hepatitis with discharged from the hospital about a month ago, history of alcohol abuse, patient came into the hospital complaining about shortness of breath and generalized aches and pains.    Clinical Impression   Pt admitted with above. Pt independent with ADLs, PTA. Feel pt will benefit from acute OT to increase independence with BADLs, as well as increase activity tolerance prior to d/c.     Follow Up Recommendations  Home health OT;Supervision - Intermittent    Equipment Recommendations  3 in 1 bedside comode    Recommendations for Other Services       Precautions / Restrictions Precautions Precautions: Fall Restrictions Weight Bearing Restrictions: No      Mobility Bed Mobility Overal bed mobility: Modified Independent                Transfers Overall transfer level: Needs assistance Equipment used: None;Straight cane Transfers: Sit to/from Stand Sit to Stand: Min guard;Supervision         General transfer comment: cues to control descent to toilet    Balance    Min guard assist for ambulation.                                        ADL Overall ADL's : Needs assistance/impaired                     Lower Body Dressing: Sit to/from stand;Minimal assistance   Toilet Transfer: Min guard;Ambulation;Regular Toilet;Grab bars   Toileting- Clothing Manipulation and Hygiene: Supervision/safety;Sit to/from stand       Functional mobility during ADLs: Min guard;Cane (ambulated with and without cane) General ADL Comments: Educated on energy conservation techniques. Educated on AE and pt practiced with sockaid. Explained options for shower chair and how 3 in 1 can be used different ways.     Vision    pt wears glasses   Perception     Praxis      Pertinent Vitals/Pain Pain Assessment: 0-10 Pain Score: 7  Pain Location: Rt hip Pain Intervention(s): Monitored during session     Hand Dominance     Extremity/Trunk Assessment Upper Extremity Assessment Upper Extremity Assessment: Overall WFL for tasks assessed   Lower Extremity Assessment Lower Extremity Assessment: Defer to PT evaluation       Communication Communication Communication: No difficulties   Cognition Arousal/Alertness: Awake/alert Behavior During Therapy: WFL for tasks assessed/performed Overall Cognitive Status: Within Functional Limits for tasks assessed                     General Comments       Exercises       Shoulder Instructions      Home Living Family/patient expects to be discharged to:: Private residence Living Arrangements: Alone Available Help at Discharge: Neighbor;Available PRN/intermittently Type of Home: Other(Comment) (townhome) Home Access: Stairs to enter Entrance Stairs-Number of Steps: 1   Home Layout: One level     Bathroom Shower/Tub: Walk-in shower;Tub/shower unit   Bathroom Toilet: Standard (also has elongated toilet)     Home Equipment: Cane - single point          Prior Functioning/Environment Level of Independence: Independent with assistive  device(s)             OT Diagnosis: Generalized weakness;Acute pain   OT Problem List: Decreased strength;Decreased activity tolerance;Decreased knowledge of use of DME or AE;Decreased knowledge of precautions;Pain;Increased edema   OT Treatment/Interventions: Self-care/ADL training;DME and/or AE instruction;Therapeutic activities;Patient/family education;Balance training;Therapeutic exercise;Energy conservation    OT Goals(Current goals can be found in the care plan section) Acute Rehab OT Goals Patient Stated Goal: not stated OT Goal Formulation: With patient Time For Goal Achievement:  05/02/14 Potential to Achieve Goals: Good ADL Goals Pt Will Perform Lower Body Bathing: with modified independence;sit to/from stand Pt Will Perform Lower Body Dressing: with modified independence;sit to/from stand Pt Will Transfer to Toilet: with modified independence;ambulating Pt Will Perform Toileting - Clothing Manipulation and hygiene: with modified independence;sit to/from stand Pt Will Perform Tub/Shower Transfer: Shower transfer;ambulating;with modified independence;3 in 1  OT Frequency: Min 2X/week   Barriers to D/C:            Co-evaluation              End of Session Equipment Utilized During Treatment: Gait belt;Other (comment) (cane)  Activity Tolerance: Patient limited by pain Patient left: in bed;with call bell/phone within reach;with bed alarm set   Time: 1716-1731 OT Time Calculation (min): 15 min Charges:  OT General Charges $OT Visit: 1 Procedure OT Evaluation $Initial OT Evaluation Tier I: 1 Procedure G-CodesEarlie Raveling OTR/L [phone] 04/25/2014, 6:16 PM

## 2014-04-25 NOTE — ED Notes (Signed)
Given [place] [employer]

## 2014-04-26 DIAGNOSIS — M064 Inflammatory polyarthropathy: Secondary | ICD-10-CM

## 2014-04-26 DIAGNOSIS — K219 Gastro-esophageal reflux disease without esophagitis: Secondary | ICD-10-CM

## 2014-04-26 DIAGNOSIS — K7030 Alcoholic cirrhosis of liver without ascites: Secondary | ICD-10-CM | POA: Insufficient documentation

## 2014-04-26 DIAGNOSIS — M130 Polyarthritis, unspecified: Secondary | ICD-10-CM | POA: Insufficient documentation

## 2014-04-26 LAB — COMPREHENSIVE METABOLIC PANEL
ALK PHOS: 98 U/L (ref 39–117)
ALT: 16 U/L (ref 0–53)
AST: 34 U/L (ref 0–37)
Albumin: 2.6 g/dL — ABNORMAL LOW (ref 3.5–5.2)
Anion gap: 7 (ref 5–15)
BILIRUBIN TOTAL: 1.4 mg/dL — AB (ref 0.3–1.2)
BUN: 7 mg/dL (ref 6–23)
CALCIUM: 8.4 mg/dL (ref 8.4–10.5)
CO2: 18 mmol/L — ABNORMAL LOW (ref 19–32)
Chloride: 111 mmol/L (ref 96–112)
Creatinine, Ser: 0.72 mg/dL (ref 0.50–1.35)
GLUCOSE: 200 mg/dL — AB (ref 70–99)
Potassium: 4.1 mmol/L (ref 3.5–5.1)
Sodium: 136 mmol/L (ref 135–145)
Total Protein: 5.7 g/dL — ABNORMAL LOW (ref 6.0–8.3)

## 2014-04-26 LAB — CBC
HCT: 37 % — ABNORMAL LOW (ref 39.0–52.0)
Hemoglobin: 12.4 g/dL — ABNORMAL LOW (ref 13.0–17.0)
MCH: 34.3 pg — ABNORMAL HIGH (ref 26.0–34.0)
MCHC: 33.5 g/dL (ref 30.0–36.0)
MCV: 102.2 fL — AB (ref 78.0–100.0)
Platelets: 117 10*3/uL — ABNORMAL LOW (ref 150–400)
RBC: 3.62 MIL/uL — ABNORMAL LOW (ref 4.22–5.81)
RDW: 12.9 % (ref 11.5–15.5)
WBC: 4.3 10*3/uL (ref 4.0–10.5)

## 2014-04-26 LAB — HEMOGLOBIN A1C
HEMOGLOBIN A1C: 4.9 % (ref 4.8–5.6)
Mean Plasma Glucose: 94 mg/dL

## 2014-04-26 MED ORDER — PANTOPRAZOLE SODIUM 40 MG PO TBEC: 40.0000 mg | DELAYED_RELEASE_TABLET | Freq: Every day | ORAL | Status: AC

## 2014-04-26 MED ORDER — LISINOPRIL 20 MG PO TABS
20.0000 mg | ORAL_TABLET | Freq: Every day | ORAL | Status: DC
Start: 2014-04-26 — End: 2015-06-20

## 2014-04-26 MED ORDER — HYDROCODONE-ACETAMINOPHEN 5-325 MG PO TABS: 1.0000 | ORAL_TABLET | Freq: Four times a day (QID) | ORAL | Status: DC | PRN

## 2014-04-26 MED ORDER — PREDNISONE 20 MG PO TABS: ORAL_TABLET | ORAL | Status: DC

## 2014-04-26 MED ORDER — ALLOPURINOL 100 MG PO TABS: 150.0000 mg | ORAL_TABLET | Freq: Every day | ORAL | Status: DC

## 2014-04-26 MED ORDER — FUROSEMIDE 40 MG PO TABS: 40.0000 mg | ORAL_TABLET | Freq: Every day | ORAL | Status: DC

## 2014-04-26 MED ORDER — COLCHICINE 0.6 MG PO TABS: 0.6000 mg | ORAL_TABLET | Freq: Every day | ORAL | Status: DC

## 2014-04-26 MED ORDER — METOPROLOL TARTRATE 25 MG PO TABS: 12.5000 mg | ORAL_TABLET | Freq: Two times a day (BID) | ORAL | Status: DC

## 2014-04-26 MED ORDER — POTASSIUM CHLORIDE CRYS ER 20 MEQ PO TBCR: 20.0000 meq | EXTENDED_RELEASE_TABLET | Freq: Every day | ORAL | Status: DC

## 2014-04-26 NOTE — Progress Notes (Signed)
Utilization review completed. Elvenia Godden, RN, BSN. 

## 2014-04-26 NOTE — Discharge Summary (Addendum)
Physician Discharge Summary  Gary [name] XBJ:[id number] DOB: [birth year] DOA: 04/25/2014  PCP: [name], MD  Admit date: 04/25/2014 Discharge date: 04/26/2014  Time spent: >30 minutes  Recommendations for Outpatient Follow-up:  Check BMET to follow electrolytes and renal function Check CBC to follow platelets count Patient will benefit of GI and rheumatology services as an outpatient    Discharge Diagnoses:  Principal Problem:   Shortness of breath Active Problems:   History of alcohol abuse   Lower extremity edema   Ascites   Rash   Left ankle pain Thrombocytopenia (acute on chronic, appears to be related to use of heparin)  Discharge Condition: stable and improved. Will discharge home with instructions to follow with PCP in 10 days.  Diet recommendation: low sodium diet (less than 2 gram daily)  Filed Weights   04/25/14 0451 04/26/14 0437  Weight: 86.183 kg (190 lb) 86 kg (189 lb 9.5 oz)    History of present illness:  64 y.o. male with past medical history of recent episode of severe alcoholic hepatitis with discharged from the hospital about a month ago, history of alcohol abuse, patient came into the hospital complaining about shortness of breath and generalized aches and pains. Patient reported after he was discharged from the hospital he went to SNF, stay there for 3-4 weeks and then discharge home, since he got home he was not able to ambulate very well, having severe pain in both his ankles, as well as right hip. Patient also have severe shortness of breath, came in today for further evaluation. In the ED CT angiography was done and showed no evidence of PE, patient was tachycardic given 1 L of fluids, total bilirubin is 1.8, hypokalemic with potassium of 3.1. Patient admitted to the hospital for further evaluation of shortness of breath, tachycardia and bilateral ankle pain.   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 of albuterol, colchicine and steroids (as he was using prednisone for alcohol hepatitis) -after solumedrol given, patient symptoms dramatically improved -will discharge on prednisone tapering and will resume allopurinol and colchicine  3-skin rash: pretty much clear with steroids -no open wounds and patient denies itching  4-recent alcoholic hepatitis: LFT's have improved -patient encourage to keep himself alcohol free -CTA done to r/o PE suggesting cirrhosis; will benefit of GI follow up as an outpatient  5-thrombocytopenia: acute on chronic (due to hx of alcohol and cirrhosis); most likely for the use heparin products -no signs of bleeding -will recommend to avoid heparin products if possible -will recommend to follow CBC to follow platelets count  Procedures:  None   Consultations:  None   Discharge Exam: Filed Vitals:   04/26/14 1039  BP: 135/92  Pulse: 83  Temp: 97.7 F (36.5 C)  Resp: 18    General: stable and improved. No CP, no SOB and denies any joint pain at discharge Cardiovascular: S1 and S2, no rubs or gallops Respiratory: no wheezing, no crackles; good air movement Abd: soft, NT, ND, positive BS Extremities: no edema, no cyanosis  Discharge Instructions   Discharge Instructions    Diet - low sodium heart healthy    Complete by:  As directed  Discharge instructions    Complete by:  As directed   Take medications as prescribed Follow a low sodium diet (less than 2 gram of sodium daily) Arrange hospital follow up with PCP in 10 days Maintain adequate hydration          Current Discharge Medication List    START  taking these medications   Details  HYDROcodone-acetaminophen (NORCO/VICODIN) 5-325 MG per tablet Take 1 tablet by mouth every 6 (six) hours as needed for severe pain. Qty: 30 tablet, Refills: 0    metoprolol tartrate (LOPRESSOR) 25 MG tablet Take 0.5 tablets (12.5 mg total) by mouth 2 (two) times daily. Qty: 60 tablet, Refills: 1      CONTINUE these medications which have CHANGED   Details  allopurinol (ZYLOPRIM) 100 MG tablet Take 1.5 tablets (150 mg total) by mouth daily. Qty: 30 tablet, Refills: 1    colchicine 0.6 MG tablet Take 1 tablet (0.6 mg total) by mouth daily. Qty: 30 tablet, Refills: 1    furosemide (LASIX) 40 MG tablet Take 1 tablet (40 mg total) by mouth daily. Qty: 30 tablet, Refills: 1    lisinopril (PRINIVIL,ZESTRIL) 20 MG tablet Take 1 tablet (20 mg total) by mouth daily. Qty: 30 tablet, Refills: 1    pantoprazole (PROTONIX) 40 MG tablet Take 1 tablet (40 mg total) by mouth daily. Qty: 30 tablet, Refills: 1    potassium chloride SA (K-DUR,KLOR-CON) 20 MEQ tablet Take 1 tablet (20 mEq total) by mouth daily. Take for 4 days then stop. Qty: 30 tablet, Refills: 0    predniSONE (DELTASONE) 20 MG tablet Take 2 tablets by mouth for 5 days; then 1 tablet by mouth daily for 5 days; then 1/2 tablet by mouth daily for 5 days and stop prednisone Qty: 20 tablet, Refills: 0      CONTINUE these medications which have NOT CHANGED   Details  feeding supplement, ENSURE COMPLETE, (ENSURE COMPLETE) LIQD Take 237 mLs by mouth 2 (two) times daily between meals.    folic acid (FOLVITE) 1 MG tablet Take 1 mg by mouth daily.    Vitamin D, Ergocalciferol, (DRISDOL) 50000 UNITS CAPS capsule Take 50,000 Units by mouth every 30 (thirty) days.    Multiple Vitamin (MULTIVITAMIN WITH MINERALS) TABS tablet Take 1 tablet by mouth daily.    Omega-3 Fatty Acids (FISH OIL PO) Take 1 capsule by mouth daily.      STOP taking these medications     lactulose (CHRONULAC) 10 GM/15ML solution       thiamine 100 MG tablet      traMADol (ULTRAM) 50 MG tablet        No Known Allergies   The results of significant diagnostics from this hospitalization (including imaging, microbiology, ancillary and laboratory) are listed below for reference.    Significant Diagnostic Studies: Dg Ankle 2 Views Left  04/25/2014   CLINICAL DATA:  Gout  EXAM: LEFT ANKLE - 2 VIEW  COMPARISON:  02/06/2010  FINDINGS: Two views of the left ankle submitted. No acute fracture or subluxation. Mild spurring of distal tibia medial malleolus. Atherosclerotic vascular calcifications are noted.  IMPRESSION: No acute fracture or subluxation.   Electronically Signed   By: Natasha Mead M.D.   On: 04/25/2014 13:45   Ct Angio Chest Pe W/cm &/or Wo Cm  04/25/2014   CLINICAL DATA:  Knot in the middle of chest. Very short of breath. Evaluate for pulmonary embolism.  EXAM: CT ANGIOGRAPHY CHEST WITH CONTRAST  TECHNIQUE: Multidetector CT imaging  of the chest was performed using the standard protocol during bolus administration of intravenous contrast. Multiplanar CT image reconstructions and MIPs were obtained to evaluate the vascular anatomy.  CONTRAST:  OMNIPAQUE IOHEXOL 350 MG/ML SOLN  COMPARISON:  Chest radiograph - 04/25/2014  FINDINGS: Vascular Findings:  There is suboptimal opacification of the pulmonary arterial system with the main pulmonary artery measuring only 213 Hounsfield units. Given this limitation, there are no discrete filling defects within the pulmonary arterial tree to the level of the bilateral subsegmental pulmonary arteries. Evaluation of the distal subsegmental pulmonary arteries is degraded secondary to suboptimal vessel opacification.  Normal caliber of the main pulmonary artery.  Normal heart size. No pericardial effusion. Coronary artery calcifications.  There is mild fusiform ectasia of the ascending thoracic aorta measuring approximately 39 mm in greatest oblique coronal dimension. Scattered  atherosclerotic plaque within the aortic arch and descending thoracic aorta, not resulting in hemodynamically significant stenosis. Bovine configuration of the aortic arch is incidentally noted. Branch vessels of the aortic arch appear widely patent without a hemodynamically significant stenosis. No thoracic aortic dissection or periaortic stranding on this nongated examination.  Review of the MIP images confirms the above findings.   ----------------------------------------------------------------------------------  Nonvascular Findings:  There is minimal dependent subpleural ground-glass atelectasis. No discrete focal airspace opacities. No pleural effusion or pneumothorax. The central pulmonary airways appear widely patent.  No discrete pulmonary nodules. Scattered shotty mediastinal lymph nodes are individually not enlarged by size criteria with index sub- carinal lymph node measuring 0.7 cm in greatest short axis diameter (image 39, series 4) and index precarinal lymph node measuring 0.9 cm (image 33). No mediastinal, hilar or axillary lymphadenopathy.  Limited early arterial phase evaluation of the upper abdomen demonstrates an approximately 1.2 cm hypo attenuating (11 Hounsfield unit) cyst within the dome of the lateral segment of the left lobe of the liver (image 78, series 4). Additional scattered sub cm hypoattenuating hepatic lesions are too small to adequately characterize though favored to represent additional hepatic cysts.  There is mild nodularity of the hepatic contour with recanalization of the periumbilical vein (representative image 88, series 4). There is a trace amount of perihepatic (image 81, series 4) and perisplenic (image 83) fluid within the image upper abdomen.  No acute or aggressive osseous abnormalities. Old/healed fractures involving the posterior aspects of the right third through eighth ribs.  IMPRESSION: 1. No acute cardiopulmonary disease. Specifically, no evidence of pulmonary  embolism to the level of the bilateral subsegmental pulmonary arteries. 2. Coronary artery calcifications. 3. Fusiform ectasia of the ascending thoracic aorta measuring 39 mm in diameter. No evidence of thoracic aortic dissection on this nongated examination. 4. Suspected cirrhosis and portal venous hypertension including trace amount of perihepatic and perisplenic fluid and re- cannulization of the periumbilical vein, incompletely evaluated on this chest CT. Correlation with LFTs is recommended.   Electronically Signed   By: Simonne Come M.D.   On: 04/25/2014 07:46   Dg Chest Port 1 View  04/25/2014   CLINICAL DATA:  Chest pain and shortness of breath.  Tachycardia.  EXAM: PORTABLE CHEST - 1 VIEW  COMPARISON:  02/26/2014  FINDINGS: Lower lung volumes from prior accentuating the cardiac silhouette and bronchovascular markings. No confluent airspace disease. No large pleural effusion or pneumothorax. Old right-sided rib fractures are unchanged.  IMPRESSION: Low lung volumes without acute process.   Electronically Signed   By: Rubye Oaks M.D.   On: 04/25/2014 05:28    Labs: Basic Metabolic Panel:  Recent Labs Lab 04/25/14 0434 04/25/14 0454 04/26/14 0448  NA  --  139 136  K  --  3.1* 4.1  CL  --  111 111  CO2  --  24 18*  GLUCOSE  --  96 200*  BUN  --  <5* 7  CREATININE  --  0.88 0.72  CALCIUM  --  8.7 8.4  MG 1.8  --   --    Liver Function Tests:  Recent Labs Lab 04/25/14 0434 04/25/14 0454 04/26/14 0448  AST 36 36 34  ALT 17 16 16   ALKPHOS 108 107 98  BILITOT 1.8* 1.8* 1.4*  PROT 5.9* 5.8* 5.7*  ALBUMIN 2.8* 2.8* 2.6*    Recent Labs Lab 04/25/14 0434  LIPASE 64*   CBC:  Recent Labs Lab 04/25/14 0454 04/26/14 0448  WBC 5.3 4.3  HGB 13.1 12.4*  HCT 39.5 37.0*  MCV 102.3* 102.2*  PLT 143* 117*   BNP (last 3 results)  Recent Labs  04/25/14 0454  BNP 379.1*    Signed:  Vassie Loll  [facility] 04/26/2014, 11:27 AM

## 2014-04-26 NOTE — Clinical Social Work Note (Signed)
CSW spoke with patient concerning transportation issues.  Patient states that he has several bills due today and does not have any form of transportation- patient has no family capable of driving - has an elderly father who does not have his license any longer.  Patient states that he only has $4.  CSW provided tax voucher to go home- patient unable to ride bus due to limited mobility (cane/ unsteady gait)  CSW signing off.  Merlyn Lot, LCSWA Clinical Social Worker [phone]

## 2014-04-26 NOTE — Progress Notes (Signed)
Pt discharge education and instructions completed with pt. Pt voices understanding and denies any questions. Pt handed his prescriptions for allopurinol, colchicine, lasix, Vicodin, Lisinopril, Lopressor, protonix, Potassium and prednisone. Pt IV's and telemetry removed; Pt discharge home with Taxi cab to transport him home with the assistance of CSW voucher provided to pt. Pt transported off unit via wheelchair with belongings to the side. P. Amo Madylyn Insco.

## 2014-04-26 NOTE — Clinical Documentation Improvement (Signed)
  Patient with low platelet level on admission and the following day.    Component     Latest Ref Range 04/25/2014 04/26/2014  Platelets     150 - 400 K/uL 143 (L) 117 (L)    Possible Clinical Conditions:  Thrombocytopenia                                                 Secondary thrombocytopenia                                                 Pancytopenia     Other condition ____________   Thank you,  Darla Lesches, RN, BSN, CCRN Clinical Documentation Improvement Specialist HIM department--[facility] [place] [phone]

## 2014-04-26 NOTE — Evaluation (Signed)
Physical Therapy Evaluation Patient Details Name: Gary [name] MRN: [id number] DOB: November 06, 1950 Today's Date: 04/26/2014   History of Present Illness  64 y.o. male with past medical history of recent episode of severe alcoholic hepatitis with discharged from the hospital about a month ago, history of alcohol abuse, patient came into the hospital complaining about shortness of breath and generalized aches and pains.   Clinical Impression  Pt is demonstrating more independent gait today with no AD, has no further pain complaints versus his intake concerns.  Pt owns a rollator and SPC and should be able to manage if any difficulties with increased pain occur.  HHPT to follow up to address his LLE reduced stability, although he is using no AD and may present a fall risk in that environment.  Will not need further equipment.    Follow Up Recommendations Home health PT    Equipment Recommendations  None recommended by PT    Recommendations for Other Services       Precautions / Restrictions Precautions Precautions: Fall Restrictions Weight Bearing Restrictions: No      Mobility  Bed Mobility               General bed mobility comments: up when PT entered  Transfers Overall transfer level: Modified independent Equipment used: None Transfers: Sit to/from [facility] Sit to Stand: Modified independent (Device/Increase time) Stand pivot transfers: Modified independent (Device/Increase time)          Ambulation/Gait Ambulation/Gait assistance: Modified independent (Device/Increase time) Ambulation Distance (Feet): 60 Feet Assistive device: None Gait Pattern/deviations: Wide base of support;Shuffle;Decreased weight shift to left;Step-through pattern Gait velocity: reduced Gait velocity interpretation: Below normal speed for age/gender    Stairs            Wheelchair Mobility    Modified Rankin (Stroke Patients Only)       Balance Overall  balance assessment: Modified Independent                                           Pertinent Vitals/Pain Pain Assessment: No/denies pain Pain Score: 0-No pain    Home Living Family/patient expects to be discharged to:: Private residence Living Arrangements: Spouse/significant other;Other (Comment) (wife in [place] now) Available Help at Discharge: Neighbor;Available PRN/intermittently Type of Home: Other(Comment) (townhouse) Home Access: Stairs to enter Entrance Stairs-Rails: None Entrance Stairs-Number of Steps: 1 Home Layout: One level Home Equipment: Cane - single point Additional Comments: chart review of his PLOF was bad, apparently had been drinking heavily    Prior Function Level of Independence: Independent with assistive device(s)         Comments: was using cane for ambulation     Hand Dominance        Extremity/Trunk Assessment   Upper Extremity Assessment: Overall WFL for tasks assessed           Lower Extremity Assessment: LLE deficits/detail   LLE Deficits / Details: L ankle is weak, has an unstable presentation with drop of hip with gait but has flatter arch and weakness B hips  Cervical / Trunk Assessment: Normal  Communication   Communication: No difficulties  Cognition Arousal/Alertness: Awake/alert Behavior During Therapy: WFL for tasks assessed/performed Overall Cognitive Status: Within Functional Limits for tasks assessed                      General  Comments General comments (skin integrity, edema, etc.): Pt has been up for  a time today with reduced wililngness to go out of room.  He is independent wtihout AD but has reduced stability LLE.  PT will go with OT recommendation to have HHPT follow up with him    Exercises Other Exercises Other Exercises: Strength testing on BLE's with hips 4- and grossly WFL BLE's otherwise      Assessment/Plan    PT Assessment All further PT needs can be met in the next  venue of care  PT Diagnosis Abnormality of gait   PT Problem List Decreased strength;Decreased range of motion;Decreased activity tolerance;Decreased mobility  PT Treatment Interventions     PT Goals (Current goals can be found in the Care Plan section) Acute Rehab PT Goals Patient Stated Goal: not stated    Frequency     Barriers to discharge        Co-evaluation               End of Session Equipment Utilized During Treatment:  (none) Activity Tolerance: Patient tolerated treatment well Patient left: in chair;with call bell/phone within reach Nurse Communication: Mobility status         Time: 9824-2998 PT Time Calculation (min) (ACUTE ONLY): 32 min   Charges:   PT Evaluation $Initial PT Evaluation Tier I: 1 Procedure PT Treatments $Gait Training: 8-22 mins   PT G CodesRamond [name] 05/18/2014, 12:30 PM   Gary [name], PT MS Acute Rehab Dept. Number: [phone]

## 2014-10-19 DIAGNOSIS — E7800 Pure hypercholesterolemia, unspecified: Secondary | ICD-10-CM

## 2014-10-19 HISTORY — DX: Pure hypercholesterolemia, unspecified: E78.00

## 2014-11-10 ENCOUNTER — Inpatient Hospital Stay (HOSPITAL_COMMUNITY)
Admission: EM | Admit: 2014-11-10 | Discharge: 2014-11-13 | DRG: 441 | Disposition: A | Discharge status: Home / Self Care | Payer: 59 | Attending: Internal Medicine | Admitting: Internal Medicine

## 2014-11-10 ENCOUNTER — Encounter (HOSPITAL_COMMUNITY): Payer: Self-pay | Admitting: Cardiology

## 2014-11-10 ENCOUNTER — Emergency Department (HOSPITAL_COMMUNITY): Payer: 59

## 2014-11-10 DIAGNOSIS — K7460 Unspecified cirrhosis of liver: Secondary | ICD-10-CM | POA: Diagnosis present

## 2014-11-10 DIAGNOSIS — K7290 Hepatic failure, unspecified without coma: Secondary | ICD-10-CM | POA: Diagnosis present

## 2014-11-10 DIAGNOSIS — R569 Unspecified convulsions: Secondary | ICD-10-CM

## 2014-11-10 DIAGNOSIS — I119 Hypertensive heart disease without heart failure: Secondary | ICD-10-CM | POA: Diagnosis present

## 2014-11-10 DIAGNOSIS — E871 Hypo-osmolality and hyponatremia: Secondary | ICD-10-CM | POA: Diagnosis present

## 2014-11-10 DIAGNOSIS — R748 Abnormal levels of other serum enzymes: Secondary | ICD-10-CM

## 2014-11-10 DIAGNOSIS — M1990 Unspecified osteoarthritis, unspecified site: Secondary | ICD-10-CM | POA: Diagnosis present

## 2014-11-10 DIAGNOSIS — Z79891 Long term (current) use of opiate analgesic: Secondary | ICD-10-CM

## 2014-11-10 DIAGNOSIS — D696 Thrombocytopenia, unspecified: Secondary | ICD-10-CM | POA: Diagnosis present

## 2014-11-10 DIAGNOSIS — K7200 Acute and subacute hepatic failure without coma: Secondary | ICD-10-CM | POA: Diagnosis not present

## 2014-11-10 DIAGNOSIS — G0490 Encephalitis and encephalomyelitis, unspecified: Secondary | ICD-10-CM | POA: Diagnosis present

## 2014-11-10 DIAGNOSIS — F10230 Alcohol dependence with withdrawal, uncomplicated: Secondary | ICD-10-CM | POA: Diagnosis not present

## 2014-11-10 DIAGNOSIS — F10931 Alcohol use, unspecified with withdrawal delirium: Secondary | ICD-10-CM | POA: Diagnosis present

## 2014-11-10 DIAGNOSIS — D649 Anemia, unspecified: Secondary | ICD-10-CM | POA: Diagnosis present

## 2014-11-10 DIAGNOSIS — F10231 Alcohol dependence with withdrawal delirium: Secondary | ICD-10-CM | POA: Diagnosis present

## 2014-11-10 DIAGNOSIS — Z79899 Other long term (current) drug therapy: Secondary | ICD-10-CM | POA: Diagnosis not present

## 2014-11-10 DIAGNOSIS — E876 Hypokalemia: Secondary | ICD-10-CM | POA: Diagnosis present

## 2014-11-10 DIAGNOSIS — K7010 Alcoholic hepatitis without ascites: Secondary | ICD-10-CM | POA: Diagnosis present

## 2014-11-10 DIAGNOSIS — G9341 Metabolic encephalopathy: Secondary | ICD-10-CM | POA: Diagnosis present

## 2014-11-10 DIAGNOSIS — I340 Nonrheumatic mitral (valve) insufficiency: Secondary | ICD-10-CM | POA: Diagnosis not present

## 2014-11-10 DIAGNOSIS — Z7951 Long term (current) use of inhaled steroids: Secondary | ICD-10-CM

## 2014-11-10 DIAGNOSIS — I509 Heart failure, unspecified: Secondary | ICD-10-CM | POA: Diagnosis present

## 2014-11-10 DIAGNOSIS — I10 Essential (primary) hypertension: Secondary | ICD-10-CM | POA: Diagnosis present

## 2014-11-10 DIAGNOSIS — G4089 Other seizures: Secondary | ICD-10-CM | POA: Diagnosis present

## 2014-11-10 DIAGNOSIS — B179 Acute viral hepatitis, unspecified: Secondary | ICD-10-CM | POA: Diagnosis present

## 2014-11-10 DIAGNOSIS — G9340 Encephalopathy, unspecified: Secondary | ICD-10-CM | POA: Diagnosis not present

## 2014-11-10 DIAGNOSIS — F10239 Alcohol dependence with withdrawal, unspecified: Secondary | ICD-10-CM

## 2014-11-10 HISTORY — DX: Heart failure, unspecified: I50.9

## 2014-11-10 HISTORY — DX: Essential (primary) hypertension: I10

## 2014-11-10 LAB — CBC WITH DIFFERENTIAL/PLATELET
Basophils Absolute: 0 10*3/uL (ref 0.0–0.1)
Basophils Relative: 0 % (ref 0–1)
EOS ABS: 0 10*3/uL (ref 0.0–0.7)
Eosinophils Relative: 0 % (ref 0–5)
HCT: 39.9 % (ref 39.0–52.0)
Hemoglobin: 13.7 g/dL (ref 13.0–17.0)
LYMPHS ABS: 0.5 10*3/uL — AB (ref 0.7–4.0)
Lymphocytes Relative: 9 % — ABNORMAL LOW (ref 12–46)
MCH: 35.2 pg — AB (ref 26.0–34.0)
MCHC: 34.3 g/dL (ref 30.0–36.0)
MCV: 102.6 fL — ABNORMAL HIGH (ref 78.0–100.0)
MONOS PCT: 19 % — AB (ref 3–12)
Monocytes Absolute: 1 10*3/uL (ref 0.1–1.0)
Neutro Abs: 3.8 10*3/uL (ref 1.7–7.7)
Neutrophils Relative %: 72 % (ref 43–77)
Platelets: 41 10*3/uL — ABNORMAL LOW (ref 150–400)
RBC: 3.89 MIL/uL — AB (ref 4.22–5.81)
RDW: 13.2 % (ref 11.5–15.5)
WBC: 5.3 10*3/uL (ref 4.0–10.5)

## 2014-11-10 LAB — COMPREHENSIVE METABOLIC PANEL
ALT: 66 U/L — ABNORMAL HIGH (ref 17–63)
AST: 149 U/L — ABNORMAL HIGH (ref 15–41)
Albumin: 3.2 g/dL — ABNORMAL LOW (ref 3.5–5.0)
Alkaline Phosphatase: 128 U/L — ABNORMAL HIGH (ref 38–126)
Anion gap: 10 (ref 5–15)
BUN: 8 mg/dL (ref 6–20)
CHLORIDE: 99 mmol/L — AB (ref 101–111)
CO2: 22 mmol/L (ref 22–32)
Calcium: 8.9 mg/dL (ref 8.9–10.3)
Creatinine, Ser: 0.88 mg/dL (ref 0.61–1.24)
GFR calc non Af Amer: >60 mL/min (ref >60)
Glucose, Bld: 123 mg/dL — ABNORMAL HIGH (ref 65–99)
POTASSIUM: 3.8 mmol/L (ref 3.5–5.1)
SODIUM: 131 mmol/L — AB (ref 135–145)
Total Bilirubin: 5.8 mg/dL — ABNORMAL HIGH (ref 0.3–1.2)
Total Protein: 5.9 g/dL — ABNORMAL LOW (ref 6.5–8.1)

## 2014-11-10 LAB — SALICYLATE LEVEL: Salicylate Lvl: <4 mg/dL (ref 2.8–30.0)

## 2014-11-10 LAB — URINALYSIS, ROUTINE W REFLEX MICROSCOPIC
Glucose, UA: NEGATIVE mg/dL
Hgb urine dipstick: NEGATIVE
Ketones, ur: 15 mg/dL — AB
NITRITE: POSITIVE — AB
PROTEIN: 100 mg/dL — AB
SPECIFIC GRAVITY, URINE: 1.025 (ref 1.005–1.030)
Urobilinogen, UA: >8 mg/dL — ABNORMAL HIGH (ref 0.0–1.0)
pH: 6.5 (ref 5.0–8.0)

## 2014-11-10 LAB — PROTIME-INR
INR: 1.32 (ref 0.00–1.49)
Prothrombin Time: 16.5 seconds — ABNORMAL HIGH (ref 11.6–15.2)

## 2014-11-10 LAB — I-STAT TROPONIN, ED: Troponin i, poc: 0 ng/mL (ref 0.00–0.08)

## 2014-11-10 LAB — URINE MICROSCOPIC-ADD ON

## 2014-11-10 LAB — LIPASE, BLOOD: Lipase: 52 U/L — ABNORMAL HIGH (ref 22–51)

## 2014-11-10 LAB — RAPID URINE DRUG SCREEN, HOSP PERFORMED
Amphetamines: NOT DETECTED
Barbiturates: NOT DETECTED
Benzodiazepines: NOT DETECTED
Cocaine: NOT DETECTED
OPIATES: NOT DETECTED
Tetrahydrocannabinol: NOT DETECTED

## 2014-11-10 LAB — ACETAMINOPHEN LEVEL: Acetaminophen (Tylenol), Serum: <10 ug/mL — ABNORMAL LOW (ref 10–30)

## 2014-11-10 LAB — ETHANOL: Alcohol, Ethyl (B): <5 mg/dL (ref <5)

## 2014-11-10 LAB — AMMONIA: AMMONIA: 84 umol/L — AB (ref 9–35)

## 2014-11-10 LAB — BILIRUBIN, FRACTIONATED(TOT/DIR/INDIR)
Bilirubin, Direct: 2.7 mg/dL — ABNORMAL HIGH (ref 0.1–0.5)
Indirect Bilirubin: 3.3 mg/dL — ABNORMAL HIGH (ref 0.3–0.9)
Total Bilirubin: 6 mg/dL — ABNORMAL HIGH (ref 0.3–1.2)

## 2014-11-10 MED ORDER — LORAZEPAM 2 MG/ML IJ SOLN
2.0000 mg | INTRAMUSCULAR | Status: DC | PRN
  Administered 2014-11-11 (×2): 2 mg via INTRAVENOUS
  Filled 2014-11-10 (×2): qty 1

## 2014-11-10 MED ORDER — ONDANSETRON HCL 4 MG PO TABS: 4.0000 mg | ORAL_TABLET | Freq: Four times a day (QID) | ORAL | Status: DC | PRN

## 2014-11-10 MED ORDER — LORAZEPAM 1 MG PO TABS
0.0000 mg | ORAL_TABLET | Freq: Two times a day (BID) | ORAL | Status: DC
Start: 2014-11-10 — End: 2014-11-10

## 2014-11-10 MED ORDER — LACTULOSE 10 GM/15ML PO SOLN
30.0000 g | ORAL | Status: AC
  Administered 2014-11-10: 30 g via ORAL
  Filled 2014-11-10: qty 45

## 2014-11-10 MED ORDER — LORAZEPAM 2 MG/ML IJ SOLN
1.0000 mg | Freq: Once | INTRAMUSCULAR | Status: AC
  Administered 2014-11-10: 1 mg via INTRAVENOUS
  Filled 2014-11-10: qty 1

## 2014-11-10 MED ORDER — LACTULOSE 10 GM/15ML PO SOLN
30.0000 g | Freq: Every day | ORAL | Status: DC
  Administered 2014-11-10 – 2014-11-12 (×3): 30 g via ORAL
  Filled 2014-11-10 (×3): qty 45

## 2014-11-10 MED ORDER — VITAMIN B-1 100 MG PO TABS: 100.0000 mg | ORAL_TABLET | Freq: Every day | ORAL | Status: DC

## 2014-11-10 MED ORDER — SODIUM CHLORIDE 0.9 % IV SOLN
INTRAVENOUS | Status: DC
  Administered 2014-11-10: 100 mL/h via INTRAVENOUS
  Administered 2014-11-10 – 2014-11-13 (×6): via INTRAVENOUS

## 2014-11-10 MED ORDER — LORAZEPAM 1 MG PO TABS: 0.0000 mg | ORAL_TABLET | Freq: Four times a day (QID) | ORAL | Status: DC

## 2014-11-10 MED ORDER — THIAMINE HCL 100 MG/ML IJ SOLN
100.0000 mg | Freq: Every day | INTRAMUSCULAR | Status: DC
  Administered 2014-11-10: 100 mg via INTRAVENOUS
  Filled 2014-11-10: qty 2

## 2014-11-10 MED ORDER — ALLOPURINOL 300 MG PO TABS
150.0000 mg | ORAL_TABLET | Freq: Every day | ORAL | Status: DC
  Administered 2014-11-11 – 2014-11-13 (×3): 150 mg via ORAL
  Filled 2014-11-10 (×3): qty 1

## 2014-11-10 MED ORDER — LORAZEPAM 2 MG/ML IJ SOLN
0.0000 mg | Freq: Four times a day (QID) | INTRAMUSCULAR | Status: DC
  Administered 2014-11-10: 1 mg via INTRAVENOUS

## 2014-11-10 MED ORDER — LORAZEPAM 2 MG/ML IJ SOLN
0.0000 mg | Freq: Two times a day (BID) | INTRAMUSCULAR | Status: DC
  Filled 2014-11-10: qty 1

## 2014-11-10 MED ORDER — SODIUM CHLORIDE 0.9 % IV BOLUS (SEPSIS)
1000.0000 mL | Freq: Once | INTRAVENOUS | Status: AC
  Administered 2014-11-10: 1000 mL via INTRAVENOUS

## 2014-11-10 MED ORDER — SODIUM CHLORIDE 0.9 % IJ SOLN
3.0000 mL | Freq: Two times a day (BID) | INTRAMUSCULAR | Status: DC
  Administered 2014-11-10 – 2014-11-12 (×5): 3 mL via INTRAVENOUS

## 2014-11-10 MED ORDER — PANTOPRAZOLE SODIUM 40 MG IV SOLR
40.0000 mg | INTRAVENOUS | Status: DC
  Administered 2014-11-10: 40 mg via INTRAVENOUS
  Filled 2014-11-10: qty 40

## 2014-11-10 MED ORDER — GUAIFENESIN-DM 100-10 MG/5ML PO SYRP: 5.0000 mL | ORAL_SOLUTION | ORAL | Status: DC | PRN

## 2014-11-10 MED ORDER — FOLIC ACID 5 MG/ML IJ SOLN
1.0000 mg | Freq: Every day | INTRAMUSCULAR | Status: DC
  Administered 2014-11-10: 1 mg via INTRAVENOUS
  Filled 2014-11-10 (×2): qty 0.2

## 2014-11-10 MED ORDER — ALBUTEROL SULFATE (2.5 MG/3ML) 0.083% IN NEBU: 2.5000 mg | INHALATION_SOLUTION | RESPIRATORY_TRACT | Status: DC | PRN

## 2014-11-10 MED ORDER — METOPROLOL TARTRATE 12.5 MG HALF TABLET
12.5000 mg | ORAL_TABLET | Freq: Two times a day (BID) | ORAL | Status: DC
  Administered 2014-11-10 – 2014-11-13 (×6): 12.5 mg via ORAL
  Filled 2014-11-10 (×6): qty 1

## 2014-11-10 MED ORDER — ONDANSETRON HCL 4 MG/2ML IJ SOLN: 4.0000 mg | Freq: Four times a day (QID) | INTRAMUSCULAR | Status: DC | PRN

## 2014-11-10 NOTE — ED Notes (Signed)
Admitting MD at the bedside.  

## 2014-11-10 NOTE — ED Provider Notes (Signed)
CSN: [id number]     Arrival date & time 11/10/14  1248 History   None    Chief Complaint  Patient presents with  . Altered Mental Status     (Consider location/radiation/quality/duration/timing/severity/associated sxs/prior Treatment) Patient is a 64 y.o. male presenting with altered mental status. The history is provided by the patient, the EMS personnel, the spouse and a friend. No language interpreter was used.  Altered Mental Status Associated symptoms: fever   Mr. Neyland is a 64 y.o male with a history of hypertension, alcohol abuse, alcoholic hepatitis and encephalopathy, and CHF who presents via EMS from home for altered mental status. His wife reports that he called her stating that he was not feeling well at couple of days ago and that he has not been himself recently. Upon EMS arrival he was only orientated to self. He reports that he drinks two 40 ounce beers a day and has not had anything in 3 days. He states that he had a fever a couple of days ago. He denies any chest pain, shortness of breath, abdominal pain, nausea, vomiting, diarrhea, constipation, urinary symptoms, lower extremity edema.  Past Medical History  Diagnosis Date  . Hypertension   . Arthritis   . Hypertension   . Alcohol abuse   . CHF (congestive heart failure)    Past Surgical History  Procedure Laterality Date  . No past surgeries     Family History  Problem Relation Age of Onset  . Heart failure Father   . Colon cancer Neg Hx   . Colon polyps Neg Hx   . Diabetes Neg Hx   . Kidney disease Neg Hx   . Gallbladder disease Neg Hx   . Esophageal cancer Neg Hx    Social History  Substance Use Topics  . Smoking status: Never Smoker   . Smokeless tobacco: Never Used  . Alcohol Use: 4.8 oz/week    7 Cans of beer, 1 Shots of liquor per week     Comment: Pt stated does not drink now - 03/16/2014    Review of Systems  Constitutional: Positive for fever.  All other systems reviewed and are  negative.     Allergies  Review of patient's allergies indicates no known allergies.  Home Medications   Prior to Admission medications   Medication Sig Start Date End Date Taking? Authorizing Provider  allopurinol (ZYLOPRIM) 100 MG tablet Take 1.5 tablets (150 mg total) by mouth daily. 04/26/14  Yes Vassie Loll, MD  feeding supplement, ENSURE COMPLETE, (ENSURE COMPLETE) LIQD Take 237 mLs by mouth 2 (two) times daily between meals. Patient taking differently: Take 237 mLs by mouth 2 (two) times daily between meals. Pt uses Med Pass 90 03/04/14  Yes Rodolph Bong, MD  folic acid (FOLVITE) 1 MG tablet Take 1 mg by mouth daily.   Yes Historical Provider, MD  furosemide (LASIX) 40 MG tablet Take 1 tablet (40 mg total) by mouth daily. 04/26/14  Yes Vassie Loll, MD  HYDROcodone-acetaminophen (NORCO/VICODIN) 5-325 MG per tablet Take 1 tablet by mouth every 6 (six) hours as needed for severe pain. 04/26/14  Yes Vassie Loll, MD  lisinopril (PRINIVIL,ZESTRIL) 20 MG tablet Take 1 tablet (20 mg total) by mouth daily. 04/26/14  Yes Vassie Loll, MD  metoprolol tartrate (LOPRESSOR) 25 MG tablet Take 0.5 tablets (12.5 mg total) by mouth 2 (two) times daily. 04/26/14  Yes Vassie Loll, MD  Omega-3 Fatty Acids (FISH OIL PO) Take 1 capsule by mouth daily.  Yes Historical Provider, MD  pantoprazole (PROTONIX) 40 MG tablet Take 1 tablet (40 mg total) by mouth daily. 04/26/14  Yes Vassie Loll, MD  potassium chloride SA (K-DUR,KLOR-CON) 20 MEQ tablet Take 1 tablet (20 mEq total) by mouth daily. Take for 4 days then stop. 04/26/14  Yes Vassie Loll, MD  predniSONE (DELTASONE) 20 MG tablet Take 2 tablets by mouth for 5 days; then 1 tablet by mouth daily for 5 days; then 1/2 tablet by mouth daily for 5 days and stop prednisone 04/26/14  Yes Vassie Loll, MD  Vitamin D, Ergocalciferol, (DRISDOL) 50000 UNITS CAPS capsule Take 50,000 Units by mouth every 7 (seven) days.    Yes Historical Provider, MD  colchicine  0.6 MG tablet Take 1 tablet (0.6 mg total) by mouth daily. Patient not taking: Reported on 11/10/2014 04/26/14   Vassie Loll, MD  Multiple Vitamin (MULTIVITAMIN WITH MINERALS) TABS tablet Take 1 tablet by mouth daily. Patient not taking: Reported on 04/25/2014 03/04/14   Rodolph Bong, MD   BP 136/86 mmHg  Pulse 97  Temp(Src) 99.1 F (37.3 C) (Oral)  Resp 21  Wt 189 lb (85.73 kg)  SpO2 96% Physical Exam  Constitutional: He is oriented to person, place, and time. He appears well-developed and well-nourished.  HENT:  Head: Normocephalic and atraumatic.  Eyes: EOM are normal.  Sclera icterus.   Neck: Normal range of motion. Neck supple.  Cardiovascular: Normal rate and regular rhythm.   Pulmonary/Chest: Effort normal and breath sounds normal. No respiratory distress. He has no wheezes.  Lungs clear to auscultation bilaterally. No decreased breath sounds or wheezing.  Abdominal: Soft. Normal appearance. He exhibits no distension. There is no tenderness. There is no rebound and no guarding.  Abdomen is soft nontender. No guarding or rebound. No distention.  Musculoskeletal: Normal range of motion.  Neurological: He is alert and oriented to person, place, and time.  Tremulous. Oriented 3. Awake and alert.  Skin: Skin is warm and dry.  Nursing note and vitals reviewed.   ED Course  Procedures (including critical care time) Labs Review Labs Reviewed  CBC WITH DIFFERENTIAL/PLATELET - Abnormal; Notable for the following:    RBC 3.89 (*)    MCV 102.6 (*)    MCH 35.2 (*)    Platelets 41 (*)    Lymphocytes Relative 9 (*)    Lymphs Abs 0.5 (*)    Monocytes Relative 19 (*)    All other components within normal limits  COMPREHENSIVE METABOLIC PANEL - Abnormal; Notable for the following:    Sodium 131 (*)    Chloride 99 (*)    Glucose, Bld 123 (*)    Total Protein 5.9 (*)    Albumin 3.2 (*)    AST 149 (*)    ALT 66 (*)    Alkaline Phosphatase 128 (*)    Total Bilirubin 5.8 (*)     All other components within normal limits  ACETAMINOPHEN LEVEL - Abnormal; Notable for the following:    Acetaminophen (Tylenol), Serum <10 (*)    All other components within normal limits  URINALYSIS, ROUTINE W REFLEX MICROSCOPIC (NOT AT [facility]) - Abnormal; Notable for the following:    Color, Urine ORANGE (*)    APPearance HAZY (*)    Bilirubin Urine LARGE (*)    Ketones, ur 15 (*)    Protein, ur 100 (*)    Urobilinogen, UA >8.0 (*)    Nitrite POSITIVE (*)    Leukocytes, UA SMALL (*)  All other components within normal limits  AMMONIA - Abnormal; Notable for the following:    Ammonia 84 (*)    All other components within normal limits  URINE MICROSCOPIC-ADD ON - Abnormal; Notable for the following:    Bacteria, UA FEW (*)    Casts HYALINE CASTS (*)    All other components within normal limits  ETHANOL  SALICYLATE LEVEL  URINE RAPID DRUG SCREEN, HOSP PERFORMED  LIPASE, BLOOD  BILIRUBIN, FRACTIONATED(TOT/DIR/INDIR)  I-STAT TROPOININ, ED    Imaging Review Ct Head Wo Contrast  11/10/2014   CLINICAL DATA:  Altered mental status. History of alcohol abuse. History of hypertension.  EXAM: CT HEAD WITHOUT CONTRAST  TECHNIQUE: Contiguous axial images were obtained from the base of the skull through the vertex without intravenous contrast.  COMPARISON:  None.  FINDINGS: No evidence for acute infarction, hemorrhage, mass lesion, hydrocephalus, or extra-axial fluid. Premature for age cerebral and cerebellar atrophy. Chronic microvascular ischemic change affects the periventricular and subcortical white matter. The calvarium is intact. Vascular calcification within the carotid siphons. No sinus or mastoid disease.  IMPRESSION: No acute intracranial findings.  Premature for age atrophy.   Electronically Signed   By: Elsie Stain M.D.   On: 11/10/2014 14:13   I have personally reviewed and evaluated these images and lab results as part of my medical decision-making.   EKG  Interpretation   Date/Time:  Friday November 10 2014 12:54:37 EDT Ventricular Rate:  111 PR Interval:    QRS Duration: 106 QT Interval:  342 QTC Calculation: 465 R Axis:   -74 Text Interpretation:  sinus tachycardia  Ventricular premature complex  Inferior infarct, old No significant change since last tracing Confirmed  by YAO  MD, DAVID ([id number]) on 11/10/2014 1:14:52 PM      MDM   Final diagnoses:  Alcohol withdrawal seizure, with unspecified complication  Increased liver enzymes   Patient presents for AMS as reported by his wife. Upon arrival in the ED he is tremulous and tachycardic.  He states he has a fever a couple of days ago but has no other complaints of pain or any concerns at this time.  He reports that he stopped drinking 3 days ago because his wife has been nagging him.  He states he drinks two 40oz beers daily. He appears to be in withdrawal.  Wife is not at bedside at this time.  He was given 1mg  ativan for possible DT's.  Wife was able to give history and explained that he drinks 6-8 25oz beers per day and that this happens to him about once a month.  Today was different because she went to take the trash out and when she returned he had seized up and was not able to speak.  She describes it as a tonic clonic type seizure and that he was unresponsive for about 1 minute.  He came to but was still not at baseline until they arrived at the hospital.   CT head is negative for any acute intracranial findings. Ammonia level is 84. LFTs and bilirubin are elevated. Recheck: He has no abdominal tenderness to palpation. Medications  LORazepam (ATIVAN) tablet 0-4 mg (not administered)  LORazepam (ATIVAN) tablet 0-4 mg (0 mg Oral Not Given 11/10/14 1616)  LORazepam (ATIVAN) injection 0-4 mg (not administered)  LORazepam (ATIVAN) injection 0-4 mg (0 mg Intravenous Not Given 11/10/14 1616)  thiamine (VITAMIN B-1) tablet 100 mg (not administered)  thiamine (B-1) injection 100 mg (not  administered)  lactulose (CHRONULAC) 10  GM/15ML solution 30 g (not administered)  sodium chloride 0.9 % bolus 1,000 mL (0 mLs Intravenous Stopped 11/10/14 1616)  LORazepam (ATIVAN) injection 1 mg (1 mg Intravenous Given 11/10/14 1353)  Urine pending. He is currently stable and in no acute distress. I spoke to the hospitalist regarding admission to stepdown for alcohol withdrawal and possible DTs.      Catha Gosselin, PA-C 11/11/14 1610  Richardean Canal, MD 11/12/14 [phone]

## 2014-11-10 NOTE — ED Notes (Signed)
Pt to department via EMS from home- pt stays at home by himself often. Wife reports she travels out of town of lot and he called stating he was not feeling well a couple of days ago. Wife reports he has not been himself recently. Pt was only oriented to self at home but more oriented on arrival. Pt reports he drinks 2 40oz beers a day, and he has not anything in about 3 days. Bp- 138/102 Hr-130 20right hand cbg-150.

## 2014-11-10 NOTE — H&P (Addendum)
PATIENT DETAILS Name: Gary [name] Age: 64 y.o. Sex: male Date of Birth: 11/04/1950 Admit Date: 11/10/2014 ZOX:[id number], MD Referring Provider:Hanna Allena Katz   CHIEF COMPLAINT:  Confusion  HPI: Gary [name] is a 64 y.o. male with a Past Medical History of long-standing alcohol abuse, prior history of alcoholic hepatitis, hypertension, who presents today with the above noted complaint. Please note, patient is not a reliable historian, although awake and mostly alert-the still tremulous and somewhat still lethargic. Most of this history is obtained after reviewing ED records,'s and speaking with spouse and neighbor at bedside. Per patient, his last drink was this past Saturday (wife thinks it was this past Wednesday)-patient claims that he just decided to stop. Her spouse, he usually stops 3 or 4 days every month. Spouse also claims that the patient has had intermittent abdominal pain (patient denies) and numerous episodes of vomiting over the past few days. This afternoon, patient was noted to have a seizure, EMS was called, patient was subsequently brought to the emergency room. He was found to be tremulous, thought to have alcohol withdrawal/seizures and I was asked to admit this patient for further evaluation and treatment. Patient and spouse deny any fever or headache. Patient claims he drinks 2 bottles (24 ounces). Daily, but per spouse is close to 4-5 bottles (24 ounces)  No history of shortness of breath or chest pain. Repeatedly denies any abdominal pain as well.   ALLERGIES:  No Known Allergies  PAST MEDICAL HISTORY: Past Medical History  Diagnosis Date  . Hypertension   . Arthritis   . Hypertension   . Alcohol abuse   . CHF (congestive heart failure)     PAST SURGICAL HISTORY: Past Surgical History  Procedure Laterality Date  . No past surgeries      MEDICATIONS AT HOME: Prior to Admission medications   Medication Sig Start Date End Date Taking?  Authorizing Provider  allopurinol (ZYLOPRIM) 100 MG tablet Take 1.5 tablets (150 mg total) by mouth daily. 04/26/14  Yes Vassie Loll, MD  feeding supplement, ENSURE COMPLETE, (ENSURE COMPLETE) LIQD Take 237 mLs by mouth 2 (two) times daily between meals. Patient taking differently: Take 237 mLs by mouth 2 (two) times daily between meals. Pt uses Med Pass 90 03/04/14  Yes Rodolph Bong, MD  folic acid (FOLVITE) 1 MG tablet Take 1 mg by mouth daily.   Yes Historical Provider, MD  furosemide (LASIX) 40 MG tablet Take 1 tablet (40 mg total) by mouth daily. 04/26/14  Yes Vassie Loll, MD  HYDROcodone-acetaminophen (NORCO/VICODIN) 5-325 MG per tablet Take 1 tablet by mouth every 6 (six) hours as needed for severe pain. 04/26/14  Yes Vassie Loll, MD  lisinopril (PRINIVIL,ZESTRIL) 20 MG tablet Take 1 tablet (20 mg total) by mouth daily. 04/26/14  Yes Vassie Loll, MD  metoprolol tartrate (LOPRESSOR) 25 MG tablet Take 0.5 tablets (12.5 mg total) by mouth 2 (two) times daily. 04/26/14  Yes Vassie Loll, MD  Omega-3 Fatty Acids (FISH OIL PO) Take 1 capsule by mouth daily.   Yes Historical Provider, MD  pantoprazole (PROTONIX) 40 MG tablet Take 1 tablet (40 mg total) by mouth daily. 04/26/14  Yes Vassie Loll, MD  potassium chloride SA (K-DUR,KLOR-CON) 20 MEQ tablet Take 1 tablet (20 mEq total) by mouth daily. Take for 4 days then stop. 04/26/14  Yes Vassie Loll, MD  predniSONE (DELTASONE) 20 MG tablet Take 2 tablets by mouth for 5 days; then 1 tablet by  mouth daily for 5 days; then 1/2 tablet by mouth daily for 5 days and stop prednisone 04/26/14  Yes Vassie Loll, MD  Vitamin D, Ergocalciferol, (DRISDOL) 50000 UNITS CAPS capsule Take 50,000 Units by mouth every 7 (seven) days.    Yes Historical Provider, MD  colchicine 0.6 MG tablet Take 1 tablet (0.6 mg total) by mouth daily. Patient not taking: Reported on 11/10/2014 04/26/14   Vassie Loll, MD  Multiple Vitamin (MULTIVITAMIN WITH MINERALS) TABS tablet  Take 1 tablet by mouth daily. Patient not taking: Reported on 04/25/2014 03/04/14   Rodolph Bong, MD    FAMILY HISTORY: Family History  Problem Relation Age of Onset  . Heart failure Father   . Colon cancer Neg Hx   . Colon polyps Neg Hx   . Diabetes Neg Hx   . Kidney disease Neg Hx   . Gallbladder disease Neg Hx   . Esophageal cancer Neg Hx     SOCIAL HISTORY:  reports that he has never smoked. He has never used smokeless tobacco. He reports that he drinks about 4.8 oz of alcohol per week. He reports that he does not use illicit drugs. Lives at: Home Mobility:  Independent  REVIEW OF SYSTEMS:  Constitutional:   No  weight loss, night sweats,  Fevers  HEENT:    No headaches, Dysphagia,Tooth/dental problems,Sore throat,   Cardio-vascular: No chest pain,Orthopnea, PND,lower extremity edema, anasarca, palpitations  GI:  No heartburn, indigestion,  diarrhea, melena or hematochezia  Resp: No shortness of breath, cough, hemoptysis,plueritic chest pain.   Skin:  No rash or lesions.  GU:  No dysuria, change in color of urine, no urgency or frequency.  No flank pain.  Musculoskeletal: No joint pain or swelling.  No decreased range of motion.  No back pain.  Endocrine: No heat intolerance, no cold intolerance, no polyuria, no polydipsia  Psych: No change in mood or affect. No depression or anxiety.    PHYSICAL EXAM: Blood pressure 136/86, pulse 97, temperature 99.1 F (37.3 C), temperature source Oral, resp. rate 21, weight 85.73 kg (189 lb), SpO2 96 %.  General appearance :Awake, mostly alert, not in any distress. Chronic sick looking, + tremors.  HEENT: Atraumatic and Normocephalic, pupils equally reactive to light and accomodation Neck: supple, no JVD. No cervical lymphadenopathy.  Chest:Good air entry bilaterally, no added sounds  CVS: S1 S2 regular, no murmurs.  Abdomen: Bowel sounds present, Non tender and not distended with no gaurding, rigidity or  rebound. Extremities: B/L Lower Ext shows no edema, both legs are warm to touch. Bruising in the right toe (patient bumped it a few days back) Neurology:  Non focal Skin:No Rash Wounds:N/A  LABS ON ADMISSION:   Recent Labs  11/10/14 1447  NA 131*  K 3.8  CL 99*  CO2 22  GLUCOSE 123*  BUN 8  CREATININE 0.88  CALCIUM 8.9    Recent Labs  11/10/14 1447  AST 149*  ALT 66*  ALKPHOS 128*  BILITOT 5.8*  PROT 5.9*  ALBUMIN 3.2*   No results for input(s): LIPASE, AMYLASE in the last 72 hours.  Recent Labs  11/10/14 1447  WBC 5.3  NEUTROABS 3.8  HGB 13.7  HCT 39.9  MCV 102.6*  PLT 41*   No results for input(s): CKTOTAL, CKMB, CKMBINDEX, TROPONINI in the last 72 hours. No results for input(s): DDIMER in the last 72 hours. Invalid input(s): POCBNP   RADIOLOGIC STUDIES ON ADMISSION: Ct Head Wo Contrast  11/10/2014   CLINICAL  DATA:  Altered mental status. History of alcohol abuse. History of hypertension.  EXAM: CT HEAD WITHOUT CONTRAST  TECHNIQUE: Contiguous axial images were obtained from the base of the skull through the vertex without intravenous contrast.  COMPARISON:  None.  FINDINGS: No evidence for acute infarction, hemorrhage, mass lesion, hydrocephalus, or extra-axial fluid. Premature for age cerebral and cerebellar atrophy. Chronic microvascular ischemic change affects the periventricular and subcortical white matter. The calvarium is intact. Vascular calcification within the carotid siphons. No sinus or mastoid disease.  IMPRESSION: No acute intracranial findings.  Premature for age atrophy.   Electronically Signed   By: Elsie Stain M.D.   On: 11/10/2014 14:13    I have personally reviewed images of chest xray or the CT head   EKG: Personally reviewed. Sinus tach/low voltage ASSESSMENT AND PLAN: Present on Admission:  . Alcohol withdrawal delirium: Not sure when exactly was patient's last drink-per patient it was this past Saturday, her spouse most likely it  was this past Wednesday. In any event, tremulous-Will place on Ativan per protocol. Monitor closely in the stepdown unit. If any further deterioration, may need transfer to the intensive care unit for Precedex infusion.   . Seizure: Likely secondary to alcohol withdrawal, continue Ativan per protocol. CT head negative. Check EEG. Doubt need for any antiepileptics unless EEG positive.  . Acute alcoholic hepatitis: LFTs elevated in a pattern consistent with alcoholic hepatitis. We will check coags. Abdomen is completely benign on exam-hence doubt any biliary etiology. Follow LFTs closely. Await Coags-then will calculate discriminant score -if qualifies will start steroids.   . Thrombocytopenia: Suspect secondary to alcohol/? Underlying cirrhosis. No evidence of active bleeding. Supportive care/follow. Avoid heparin/Lovenox.   Marland Kitchen HTN (hypertension): Continue low-dose metoprolol. Hold other antihypertensives. Follow.  . Long-standing history of alcohol abuse: Will need counseling prior to discharge-unable to do at at current clinical state.  Further plan will depend as patient's clinical course evolves and further radiologic and laboratory data become available. Patient will be monitored closely.  Above noted plan was discussed with patient and spouse face to face at bedside, they were in agreement.   CONSULTS: None  DVT Prophylaxis: SCD's  Code Status: Full Code  Disposition Plan:  Discharge back home in 2-3 days,but may warrant SNF depending on clinical course   Total time spent  55 minutes.Greater than 50% of this time was spent in counseling, explanation of diagnosis, planning of further management, and coordination of care.  [facility] Pager [phone]  If 7PM-7AM, please contact night-coverage [email or web address] Password [facility] 11/10/2014, 5:33 PM

## 2014-11-10 NOTE — ED Notes (Signed)
Lab and provider at the bedside

## 2014-11-10 NOTE — ED Notes (Signed)
Wife- Teressa Senter [phone] Neighbor-Angela [phone]

## 2014-11-10 NOTE — ED Notes (Signed)
Attempted Report. Night Shift RN from floor to call back for report.

## 2014-11-10 NOTE — ED Notes (Signed)
Attempted Report 

## 2014-11-11 ENCOUNTER — Inpatient Hospital Stay (HOSPITAL_COMMUNITY): Payer: 59

## 2014-11-11 DIAGNOSIS — I10 Essential (primary) hypertension: Secondary | ICD-10-CM

## 2014-11-11 DIAGNOSIS — R569 Unspecified convulsions: Secondary | ICD-10-CM

## 2014-11-11 DIAGNOSIS — F10239 Alcohol dependence with withdrawal, unspecified: Secondary | ICD-10-CM

## 2014-11-11 DIAGNOSIS — R748 Abnormal levels of other serum enzymes: Secondary | ICD-10-CM

## 2014-11-11 LAB — CBC
HEMATOCRIT: 35.7 % — AB (ref 39.0–52.0)
HEMOGLOBIN: 12.4 g/dL — AB (ref 13.0–17.0)
MCH: 35.8 pg — ABNORMAL HIGH (ref 26.0–34.0)
MCHC: 34.7 g/dL (ref 30.0–36.0)
MCV: 103.2 fL — ABNORMAL HIGH (ref 78.0–100.0)
Platelets: 38 10*3/uL — ABNORMAL LOW (ref 150–400)
RBC: 3.46 MIL/uL — AB (ref 4.22–5.81)
RDW: 13.3 % (ref 11.5–15.5)
WBC: 3.6 10*3/uL — ABNORMAL LOW (ref 4.0–10.5)

## 2014-11-11 LAB — COMPREHENSIVE METABOLIC PANEL
ALBUMIN: 2.8 g/dL — AB (ref 3.5–5.0)
ALK PHOS: 111 U/L (ref 38–126)
ALT: 53 U/L (ref 17–63)
ANION GAP: 10 (ref 5–15)
AST: 128 U/L — AB (ref 15–41)
BILIRUBIN TOTAL: 5 mg/dL — AB (ref 0.3–1.2)
BUN: 7 mg/dL (ref 6–20)
CALCIUM: 8.1 mg/dL — AB (ref 8.9–10.3)
CO2: 19 mmol/L — ABNORMAL LOW (ref 22–32)
CREATININE: 0.77 mg/dL (ref 0.61–1.24)
Chloride: 102 mmol/L (ref 101–111)
GFR calc Af Amer: >60 mL/min (ref >60)
GFR calc non Af Amer: >60 mL/min (ref >60)
GLUCOSE: 86 mg/dL (ref 65–99)
Potassium: 3.6 mmol/L (ref 3.5–5.1)
Sodium: 131 mmol/L — ABNORMAL LOW (ref 135–145)
TOTAL PROTEIN: 5.1 g/dL — AB (ref 6.5–8.1)

## 2014-11-11 LAB — AMMONIA: Ammonia: 99 umol/L — ABNORMAL HIGH (ref 9–35)

## 2014-11-11 LAB — RAPID HIV SCREEN (HIV 1/2 AB+AG)
HIV 1/2 Antibodies: NONREACTIVE
HIV-1 P24 Antigen - HIV24: NONREACTIVE

## 2014-11-11 LAB — MAGNESIUM: Magnesium: 1.6 mg/dL — ABNORMAL LOW (ref 1.7–2.4)

## 2014-11-11 LAB — IRON AND TIBC
IRON: 146 ug/dL (ref 45–182)
SATURATION RATIOS: 82 % — AB (ref 17.9–39.5)
TIBC: 178 ug/dL — AB (ref 250–450)
UIBC: 32 ug/dL

## 2014-11-11 LAB — MRSA PCR SCREENING: MRSA by PCR: NEGATIVE

## 2014-11-11 LAB — RETICULOCYTES
RBC.: 3.63 MIL/uL — ABNORMAL LOW (ref 4.22–5.81)
RETIC CT PCT: 1.5 % (ref 0.4–3.1)
Retic Count, Absolute: 54.5 10*3/uL (ref 19.0–186.0)

## 2014-11-11 LAB — FERRITIN: FERRITIN: 3939 ng/mL — AB (ref 24–336)

## 2014-11-11 LAB — VITAMIN B12: Vitamin B-12: 885 pg/mL (ref 180–914)

## 2014-11-11 LAB — FOLATE: Folate: 26.9 ng/mL (ref >5.9)

## 2014-11-11 MED ORDER — FOLIC ACID 1 MG PO TABS
1.0000 mg | ORAL_TABLET | Freq: Every day | ORAL | Status: DC
  Administered 2014-11-11 – 2014-11-13 (×3): 1 mg via ORAL
  Filled 2014-11-11 (×3): qty 1

## 2014-11-11 MED ORDER — ENSURE ENLIVE PO LIQD
237.0000 mL | Freq: Two times a day (BID) | ORAL | Status: DC
  Administered 2014-11-11 – 2014-11-12 (×4): 237 mL via ORAL

## 2014-11-11 MED ORDER — VITAMIN B-1 100 MG PO TABS
100.0000 mg | ORAL_TABLET | Freq: Every day | ORAL | Status: DC
  Administered 2014-11-11 – 2014-11-13 (×3): 100 mg via ORAL
  Filled 2014-11-11 (×3): qty 1

## 2014-11-11 MED ORDER — PANTOPRAZOLE SODIUM 40 MG PO TBEC
40.0000 mg | DELAYED_RELEASE_TABLET | Freq: Every day | ORAL | Status: DC
  Administered 2014-11-11 – 2014-11-12 (×2): 40 mg via ORAL
  Filled 2014-11-11 (×2): qty 1

## 2014-11-11 NOTE — Procedures (Signed)
ELECTROENCEPHALOGRAM REPORT   Patient: Gary [name]      Room #: 2C-08 Age: 64 y.o.        Sex: male Referring Physician: Dr Joseph Art [facility] Report Date:  11/11/2014        Interpreting Physician: Omelia Blackwater  History: [name] is an 64 y.o. male admitted with altered mental status  Medications:  Scheduled: . allopurinol  150 mg Oral Daily  . feeding supplement (ENSURE ENLIVE)  237 mL Oral BID BM  . folic acid  1 mg Oral Daily  . lactulose  30 g Oral Daily  . metoprolol tartrate  12.5 mg Oral BID  . pantoprazole  40 mg Oral QHS  . sodium chloride  3 mL Intravenous Q12H  . thiamine  100 mg Oral Daily   Continuous: . sodium chloride 100 mL/hr at 11/11/14 1052   TSV:[id number], guaiFENesin-dextromethorphan, LORazepam, ondansetron **OR** ondansetron (ZOFRAN) IV  Conditions of Recording:  This is a 16 channel EEG carried out with the patient in the drowsy state.  Description:  The waking background activity consists of a low voltage, symmetrical, fairly well organized, mix of alpha and beta activity seen from the parieto-occipital and posterior temporal regions.No focal slowing or epileptiform activity noted.   The patient drowses with slowing to irregular, low voltage theta and beta activity. The patient goes in to a light sleep with symmetrical sleep spindles, vertex central sharp transients and irregular slow activity.   Hyperventilation was not performed. Intermittent photic stimulation was performed but failed to illicit any change in the tracing.   IMPRESSION: Normal drowsy electroencephalogram. There are no focal lateralizing or epileptiform features. The presence of diffuse beta activity is likely a medication effect related to use of benzodiazepines.    Elspeth Cho, DO [facility] [phone]  If 7pm- 7am, please page neurology on call as listed in AMION. 11/11/2014, 11:33 AM

## 2014-11-11 NOTE — Progress Notes (Signed)
[place] TEAM 1 - Stepdown/ICU TEAM Progress Note  SHERON [name] JXB:[id number] DOB: 1951-01-03 DOA: 11/10/2014 PCP: [name], MD  Admit HPI / Brief Narrative: Gary [name] is a 64 y.o. WM PMHx Long-Standing Alcohol abuse, Hx Alcoholic Hepatitis, HTN, CHF.  Presents today with the above noted complaint. Please note, patient is not a reliable historian, although awake and mostly alert-the still tremulous and somewhat still lethargic. Most of this history is obtained after reviewing ED records,'s and speaking with spouse and neighbor at bedside. Per patient, his last drink was this past Saturday (wife thinks it was this past Wednesday)-patient claims that he just decided to stop. Per spouse, he usually stops 3 or 4 days every month. Spouse also claims that the patient has had intermittent abdominal pain (patient denies) and numerous episodes of vomiting over the past few days. This afternoon, patient was noted to have a seizure, EMS was called, patient was subsequently brought to the emergency room. He was found to be tremulous, thought to have alcohol withdrawal/seizures and I was asked to admit this patient for further evaluation and treatment. Patient and spouse deny any fever or headache. Patient claims he drinks 2 bottles (24 ounces). Daily, but per spouse is close to 4-5 bottles (24 ounces)  No history of shortness of breath or chest pain. Repeatedly denies any abdominal pain as well.  HPI/Subjective: 9/10 A/O 4, states he only drank 24 ounces/day for years. States stopped 1 week ago.   Assessment/Plan: Alcohol withdrawal delirium:  -Not sure when exactly was patient's last drink-per patient it was this past Saturday, per spouse most likely it was this past Wednesday. In any event, tremulous -Will place on Ativan per protocol. Monitor closely in the stepdown unit. If any further deterioration, may need transfer to the intensive care unit for Precedex infusion.   Seizure:    -Likely secondary to alcohol withdrawal, continue Ativan per protocol.  -CT head negative.  -Check EEG.  -Doubt need for any antiepileptics unless EEG positive.  Acute alcoholic hepatitis:  -LFTs elevated in a pattern consistent with alcoholic hepatitis.  -PT/INR slightly elevated/WNL respectively. hen will calculate discriminant score -if qualifies will start steroids.   Anemia Thrombocytopenia:  -Suspect secondary to alcohol/? Underlying cirrhosis. No evidence of active bleeding. Supportive care/follow. Avoid heparin/Lovenox.   -Anemia panel pending  HTN (hypertension):  -Continue low-dose metoprolol. Hold other antihypertensives. Follow.  Long-standing history of alcohol abuse:  -Will need counseling prior to discharge-unable to do at at current clinical state.    Code Status: FULL Family Communication: no family present at time of exam Disposition Plan: Detoxification    Consultants: Dr.Peter Cristine Polio (neurology)   Procedure/Significant Events: 9/10 EEG pending; normal   Culture 02/25/2014 acute hepatitis panel negative 9/9 MRSA by PCR negative 9/10 HIV pending   Antibiotics:   DVT prophylaxis:    Devices    LINES / TUBES:      Continuous Infusions: . sodium chloride 100 mL/hr at 11/10/14 2316    Objective: VITAL SIGNS: Temp: 98.4 F (36.9 C) (09/10 0821) Temp Source: Oral (09/10 0821) BP: 137/73 mmHg (09/10 0821) Pulse Rate: 54 (09/10 0821) SPO2; FIO2:   Intake/Output Summary (Last 24 hours) at 11/11/14 1019 Last data filed at 11/10/14 2029  Gross per 24 hour  Intake   2000 ml  Output      0 ml  Net   2000 ml     Exam: General: A/O 4, No acute respiratory distress Eyes: Negative headache, eye pain,  scleral hemorrhage ENT: Negative Runny nose, negative ear pain, negative gingival bleeding, Neck:  Negative scars, masses, torticollis, lymphadenopathy, JVD Lungs: Clear to auscultation bilaterally without wheezes or  crackles Cardiovascular: Regular rate and rhythm without murmur gallop or rub normal S1 and S2 Abdomen:negative abdominal pain, negative dysphagia, nondistended, positive soft, bowel sounds, no rebound, no ascites, no appreciable mass Extremities: No significant cyanosis, clubbing, or edema bilateral lower extremities Psychiatric:  Negative depression, negative anxiety, negative fatigue, negative mania, flat affect Neurologic:  Cranial nerves II through XII intact, tongue/uvula midline, all extremities muscle strength 5/5, sensation intact throughout, negative dysarthria, negative expressive aphasia, negative receptive aphasia.   Data Reviewed: Basic Metabolic Panel:  Recent Labs Lab 11/10/14 1447 11/11/14 0310  NA 131* 131*  K 3.8 3.6  CL 99* 102  CO2 22 19*  GLUCOSE 123* 86  BUN 8 7  CREATININE 0.88 0.77  CALCIUM 8.9 8.1*   Liver Function Tests:  Recent Labs Lab 11/10/14 1447 11/10/14 1722 11/11/14 0310  AST 149*  --  128*  ALT 66*  --  53  ALKPHOS 128*  --  111  BILITOT 5.8* 6.0* 5.0*  PROT 5.9*  --  5.1*  ALBUMIN 3.2*  --  2.8*    Recent Labs Lab 11/10/14 1722  LIPASE 52*    Recent Labs Lab 11/10/14 1510 11/11/14 0310  AMMONIA 84* 99*   CBC:  Recent Labs Lab 11/10/14 1447 11/11/14 0310  WBC 5.3 3.6*  NEUTROABS 3.8  --   HGB 13.7 12.4*  HCT 39.9 35.7*  MCV 102.6* 103.2*  PLT 41* 38*   Cardiac Enzymes: No results for input(s): CKTOTAL, CKMB, CKMBINDEX, TROPONINI in the last 168 hours. BNP (last 3 results)  Recent Labs  04/25/14 0454  BNP 379.1*    ProBNP (last 3 results) No results for input(s): PROBNP in the last 8760 hours.  CBG: No results for input(s): GLUCAP in the last 168 hours.  Recent Results (from the past 240 hour(s))  MRSA PCR Screening     Status: None   Collection Time: 11/10/14 10:04 PM  Result Value Ref Range Status   MRSA by PCR NEGATIVE NEGATIVE Final    Comment:        The GeneXpert MRSA Assay (FDA approved  for NASAL specimens only), is one component of a comprehensive MRSA colonization surveillance program. It is not intended to diagnose MRSA infection nor to guide or monitor treatment for MRSA infections.      Studies:  Recent x-ray studies have been reviewed in detail by the Attending Physician  Scheduled Meds:  Scheduled Meds: . allopurinol  150 mg Oral Daily  . folic acid  1 mg Oral Daily  . lactulose  30 g Oral Daily  . metoprolol tartrate  12.5 mg Oral BID  . pantoprazole  40 mg Oral QHS  . sodium chloride  3 mL Intravenous Q12H  . thiamine  100 mg Oral Daily    Time spent on care of this patient: 40 mins   WOODS, Roselind Messier , MD  [facility] Office  [phone] Pager - [phone]  On-Call/Text Page:      [email or web address]      password [email or web address]  If 7PM-7AM, please contact night-coverage [email or web address] Password [email or web address] 11/11/2014, 10:19 AM   LOS: 1 day   Care during the described time interval was provided by me .  I have reviewed this patient's available data, including medical history, events of note, physical examination, and all test results as part of  my evaluation. I have personally reviewed and interpreted all radiology studies.   Dia Crawford, MD [phone] Pager

## 2014-11-11 NOTE — Progress Notes (Signed)
Initial Nutrition Assessment  DOCUMENTATION CODES:  Not applicable  INTERVENTION:  Ensure Enlive po BID, each supplement provides 350 kcal and 20 grams of protein  Recommend Folate, Thiamin and additional mvi in light of prolonged poor nutrition intake   NUTRITION DIAGNOSIS:  Inadequate oral intake related to ETOH abuse as evidenced by loss of >11% bw in 8 months  GOAL:  Patient will meet greater than or equal to 90% of their needs  MONITOR:  PO intake, Supplement acceptance, Labs, Weight trends  REASON FOR ASSESSMENT:  Malnutrition Screening Tool    ASSESSMENT:  64 y/o male PMHx ETOH abuse, CHF, alcoholic Hepatitis. HTN who presents with confusions/tremours thought to be related to ETOH withdrawal. Also has had vomiting past few days.   No friends/family to receive wt/intake hx from. Pt has been noted to be poor historian, unsure of accuracy of following information.  Pt reports that he has had a poor appetite recently. He has only been eating 1 meal a day. He reports he followed a low sodium diet and took Vit D weekly.   He reports having diarrhea recently.   He could not tell me his "normal weight" though he says he weighed 225 lbs about a year ago.  He says today is the first day he has felt like he had an appetite and he reported he ate most of his breakfast. Was agreeable to Ensure BID to increase protein intake.   NFPE: No wasting noted   Diet Order:  Diet NPO time specified  Skin:  Cracking, abrasions, ecchymosis  Last BM:  9/9 watery  Height:  Ht Readings from Last 1 Encounters:  11/10/14 5\' 8"  (1.727 m)   Weight:  Wt Readings from Last 1 Encounters:  11/10/14 182 lb 5.1 oz (82.7 kg)   Wt Readings from Last 10 Encounters:  11/10/14 182 lb 5.1 oz (82.7 kg)  04/26/14 189 lb 9.5 oz (86 kg)  03/16/14 198 lb 6 oz (89.982 kg)  03/04/14 206 lb 5.6 oz (93.6 kg)  09/23/13 210 lb (95.255 kg)   Ideal Body Weight:  70 kg  BMI:  Body mass index is 27.73  kg/(m^2).  Estimated Nutritional Needs:  Kcal:  1750-1900 (21-23 kcal/kg) Protein:  84-98 (1.2-1.4 g/kg IBW) Fluid:  1.8-1.9 liters and enough to replace stool losses  EDUCATION NEEDS:  No education needs identified at this time  Christophe Louis RD, LDN Nutrition Pager: [phone] 11/11/2014 10:48 AM

## 2014-11-11 NOTE — Progress Notes (Signed)
Patient given wallet per request. Wallet obtained from security. Patient states "give to me wife Quin". Wallet given to Quin.

## 2014-11-11 NOTE — Progress Notes (Signed)
EEG completed, results pending. 

## 2014-11-12 ENCOUNTER — Inpatient Hospital Stay (HOSPITAL_COMMUNITY): Payer: 59

## 2014-11-12 DIAGNOSIS — G9340 Encephalopathy, unspecified: Secondary | ICD-10-CM | POA: Diagnosis present

## 2014-11-12 DIAGNOSIS — F10230 Alcohol dependence with withdrawal, uncomplicated: Secondary | ICD-10-CM

## 2014-11-12 DIAGNOSIS — E876 Hypokalemia: Secondary | ICD-10-CM | POA: Diagnosis present

## 2014-11-12 DIAGNOSIS — I340 Nonrheumatic mitral (valve) insufficiency: Secondary | ICD-10-CM

## 2014-11-12 LAB — CBC WITH DIFFERENTIAL/PLATELET
Basophils Absolute: 0 10*3/uL (ref 0.0–0.1)
Basophils Relative: 1 % (ref 0–1)
EOS ABS: 0.1 10*3/uL (ref 0.0–0.7)
Eosinophils Relative: 1 % (ref 0–5)
HCT: 33.7 % — ABNORMAL LOW (ref 39.0–52.0)
HEMOGLOBIN: 11.7 g/dL — AB (ref 13.0–17.0)
LYMPHS ABS: 1 10*3/uL (ref 0.7–4.0)
LYMPHS PCT: 29 % (ref 12–46)
MCH: 36.1 pg — AB (ref 26.0–34.0)
MCHC: 34.7 g/dL (ref 30.0–36.0)
MCV: 104 fL — ABNORMAL HIGH (ref 78.0–100.0)
Monocytes Absolute: 0.6 10*3/uL (ref 0.1–1.0)
Monocytes Relative: 17 % — ABNORMAL HIGH (ref 3–12)
NEUTROS ABS: 1.9 10*3/uL (ref 1.7–7.7)
NEUTROS PCT: 52 % (ref 43–77)
Platelets: 43 10*3/uL — ABNORMAL LOW (ref 150–400)
RBC: 3.24 MIL/uL — AB (ref 4.22–5.81)
RDW: 13.1 % (ref 11.5–15.5)
WBC: 3.6 10*3/uL — AB (ref 4.0–10.5)

## 2014-11-12 LAB — COMPREHENSIVE METABOLIC PANEL
ALT: 47 U/L (ref 17–63)
ANION GAP: 8 (ref 5–15)
AST: 97 U/L — ABNORMAL HIGH (ref 15–41)
Albumin: 2.8 g/dL — ABNORMAL LOW (ref 3.5–5.0)
Alkaline Phosphatase: 121 U/L (ref 38–126)
BUN: 7 mg/dL (ref 6–20)
CHLORIDE: 104 mmol/L (ref 101–111)
CO2: 21 mmol/L — ABNORMAL LOW (ref 22–32)
Calcium: 9 mg/dL (ref 8.9–10.3)
Creatinine, Ser: 0.75 mg/dL (ref 0.61–1.24)
Glucose, Bld: 101 mg/dL — ABNORMAL HIGH (ref 65–99)
POTASSIUM: 3.3 mmol/L — AB (ref 3.5–5.1)
Sodium: 133 mmol/L — ABNORMAL LOW (ref 135–145)
Total Bilirubin: 3.3 mg/dL — ABNORMAL HIGH (ref 0.3–1.2)
Total Protein: 5.3 g/dL — ABNORMAL LOW (ref 6.5–8.1)

## 2014-11-12 LAB — LACTATE DEHYDROGENASE, ISOENZYMES
LDH 1: 26 % (ref 17–32)
LDH 2: 33 % (ref 25–40)
LDH 3: 20 % (ref 17–27)
LDH 4: 6 % (ref 5–13)
LDH 5: 15 % (ref 4–20)
LDH Isoenzymes, Total: 337 IU/L — ABNORMAL HIGH (ref 121–224)

## 2014-11-12 LAB — HAPTOGLOBIN: Haptoglobin: 102 mg/dL (ref 34–200)

## 2014-11-12 LAB — MAGNESIUM: MAGNESIUM: 1.5 mg/dL — AB (ref 1.7–2.4)

## 2014-11-12 LAB — AMMONIA: Ammonia: 98 umol/L — ABNORMAL HIGH (ref 9–35)

## 2014-11-12 MED ORDER — MAGNESIUM OXIDE 400 (241.3 MG) MG PO TABS
400.0000 mg | ORAL_TABLET | Freq: Two times a day (BID) | ORAL | Status: DC
  Administered 2014-11-12: 400 mg via ORAL
  Filled 2014-11-12: qty 1

## 2014-11-12 MED ORDER — LACTULOSE 10 GM/15ML PO SOLN
30.0000 g | Freq: Three times a day (TID) | ORAL | Status: DC
  Administered 2014-11-12 – 2014-11-13 (×2): 30 g via ORAL
  Filled 2014-11-12 (×2): qty 45

## 2014-11-12 MED ORDER — POTASSIUM CHLORIDE CRYS ER 20 MEQ PO TBCR
50.0000 meq | EXTENDED_RELEASE_TABLET | Freq: Once | ORAL | Status: AC
  Administered 2014-11-12: 50 meq via ORAL
  Filled 2014-11-12: qty 3

## 2014-11-12 MED ORDER — MAGNESIUM OXIDE 400 (241.3 MG) MG PO TABS: 400.0000 mg | ORAL_TABLET | Freq: Two times a day (BID) | ORAL | Status: DC

## 2014-11-12 NOTE — Progress Notes (Signed)
  Echocardiogram 2D Echocardiogram has been performed.  Delcie Roch 11/12/2014, 5:28 PM

## 2014-11-12 NOTE — Progress Notes (Signed)
[place] TEAM 1 - Stepdown/ICU TEAM Progress Note  TYSEN ROESLER ZOX:[id number] DOB: 1950/11/21 DOA: 11/10/2014 PCP: [name], MD  Admit HPI / Brief Narrative: PAU BANH is a 64 y.o. WM PMHx Long-Standing Alcohol abuse, Hx Alcoholic Hepatitis, HTN, CHF.  Presents today with the above noted complaint. Please note, patient is not a reliable historian, although awake and mostly alert-the still tremulous and somewhat still lethargic. Most of this history is obtained after reviewing ED records,'s and speaking with spouse and neighbor at bedside. Per patient, his last drink was this past Saturday (wife thinks it was this past Wednesday)-patient claims that he just decided to stop. Per spouse, he usually stops 3 or 4 days every month. Spouse also claims that the patient has had intermittent abdominal pain (patient denies) and numerous episodes of vomiting over the past few days. This afternoon, patient was noted to have a seizure, EMS was called, patient was subsequently brought to the emergency room. He was found to be tremulous, thought to have alcohol withdrawal/seizures and I was asked to admit this patient for further evaluation and treatment. Patient and spouse deny any fever or headache. Patient claims he drinks 2 bottles (24 ounces). Daily, but per spouse is close to 4-5 bottles (24 ounces)  No history of shortness of breath or chest pain. Repeatedly denies any abdominal pain as well.  HPI/Subjective: 9/11 A/O 4, states feels better than at admission.   Assessment/Plan: Alcohol withdrawal delirium/Metabolic encephalopathy -Most likely multifactorial to include alcohol abuse, alcoholic hepatitis, elevated ammonia  -Not sure when exactly was patient's last drink-per patient it was this past Saturday, per spouse most likely it was this past Wednesday. In any event, tremulous -Will place on Ativan per protocol.  -UDS negative -Echocardiogram pending  -patient's cognition  improving -Ammonia still elevated increase lactulose to 30 gm TID; may have to start rifaximin. -CSW consult placed outpatient alcoholic rehabilitation  Seizure:  -Likely secondary to alcohol withdrawal, continue Ativan per protocol.  -CT head negative.  -EEG; negative seizures, see results below.   Acute alcoholic hepatitis without ascites:  -LFTs elevated in a pattern consistent with alcoholic hepatitis.  -PT/INR slightly elevated/WNL respectively.  Anemia Thrombocytopenia:  -Suspect secondary to alcohol/? Underlying cirrhosis. No evidence of active bleeding. Supportive care/follow. Avoid heparin/Lovenox.  -Anemia panel pending  HTN (hypertension):  -Continue low-dose metoprolol. Hold other antihypertensives. Follow.  Long-standing history of alcohol abuse:  -Will need counseling prior to discharge-unable to do at at current clinical state.  Hypokalemia -K-Dur 50 mEq  Hypomagnesemia -Magnesium oxide 400 mg 2 doses  Elevated ammonia -See metabolic encephalitis    Code Status: FULL Family Communication: no family present at time of exam Disposition Plan: Detoxification    Consultants: Dr.Peter Cristine Polio (neurology)   Procedure/Significant Events: 9/10 EEG pending; normal   Culture 02/25/2014 acute hepatitis panel negative 9/9 MRSA by PCR negative 9/10 HIV pending   Antibiotics:   DVT prophylaxis: SCD   Devices    LINES / TUBES:      Continuous Infusions: . sodium chloride 100 mL/hr at 11/12/14 1724    Objective: VITAL SIGNS: Temp: 98.7 F (37.1 C) (09/11 2008) Temp Source: Oral (09/11 2008) BP: 150/84 mmHg (09/11 2008) Pulse Rate: 90 (09/11 2008) SPO2; FIO2:   Intake/Output Summary (Last 24 hours) at 11/12/14 2141 Last data filed at 11/12/14 1800  Gross per 24 hour  Intake   2703 ml  Output      0 ml  Net   2703 ml  Exam: General: A/O 4, No acute respiratory distress Eyes: Negative headache, eye pain, scleral  hemorrhage ENT: Negative Runny nose, negative ear pain, negative gingival bleeding, Neck:  Negative scars, masses, torticollis, lymphadenopathy, JVD Lungs: Clear to auscultation bilaterally without wheezes or crackles Cardiovascular: Regular rate and rhythm without murmur gallop or rub normal S1 and S2 Abdomen:negative abdominal pain, negative dysphagia, nondistended, positive soft, bowel sounds, no rebound, no ascites, no appreciable mass Extremities: No significant cyanosis, clubbing, or edema bilateral lower extremities Psychiatric:  Negative depression, negative anxiety, negative fatigue, negative mania, flat affect Neurologic:  Cranial nerves II through XII intact, tongue/uvula midline, all extremities muscle strength 5/5, sensation intact throughout, negative dysarthria, negative expressive aphasia, negative receptive aphasia.   Data Reviewed: Basic Metabolic Panel:  Recent Labs Lab 11/10/14 1447 11/11/14 0310 11/11/14 1320 11/12/14 1807  NA 131* 131*  --  133*  K 3.8 3.6  --  3.3*  CL 99* 102  --  104  CO2 22 19*  --  21*  GLUCOSE 123* 86  --  101*  BUN 8 7  --  7  CREATININE 0.88 0.77  --  0.75  CALCIUM 8.9 8.1*  --  9.0  MG  --   --  1.6* 1.5*   Liver Function Tests:  Recent Labs Lab 11/10/14 1447 11/10/14 1722 11/11/14 0310 11/12/14 1807  AST 149*  --  128* 97*  ALT 66*  --  53 47  ALKPHOS 128*  --  111 121  BILITOT 5.8* 6.0* 5.0* 3.3*  PROT 5.9*  --  5.1* 5.3*  ALBUMIN 3.2*  --  2.8* 2.8*    Recent Labs Lab 11/10/14 1722  LIPASE 52*    Recent Labs Lab 11/10/14 1510 11/11/14 0310 11/12/14 1807  AMMONIA 84* 99* 98*   CBC:  Recent Labs Lab 11/10/14 1447 11/11/14 0310 11/12/14 1807  WBC 5.3 3.6* 3.6*  NEUTROABS 3.8  --  1.9  HGB 13.7 12.4* 11.7*  HCT 39.9 35.7* 33.7*  MCV 102.6* 103.2* 104.0*  PLT 41* 38* 43*   Cardiac Enzymes: No results for input(s): CKTOTAL, CKMB, CKMBINDEX, TROPONINI in the last 168 hours. BNP (last 3  results)  Recent Labs  04/25/14 0454  BNP 379.1*    ProBNP (last 3 results) No results for input(s): PROBNP in the last 8760 hours.  CBG: No results for input(s): GLUCAP in the last 168 hours.  Recent Results (from the past 240 hour(s))  MRSA PCR Screening     Status: None   Collection Time: 11/10/14 10:04 PM  Result Value Ref Range Status   MRSA by PCR NEGATIVE NEGATIVE Final    Comment:        The GeneXpert MRSA Assay (FDA approved for NASAL specimens only), is one component of a comprehensive MRSA colonization surveillance program. It is not intended to diagnose MRSA infection nor to guide or monitor treatment for MRSA infections.      Studies:  Recent x-ray studies have been reviewed in detail by the Attending Physician  Scheduled Meds:  Scheduled Meds: . allopurinol  150 mg Oral Daily  . feeding supplement (ENSURE ENLIVE)  237 mL Oral BID BM  . folic acid  1 mg Oral Daily  . lactulose  30 g Oral TID  . magnesium oxide  400 mg Oral BID  . metoprolol tartrate  12.5 mg Oral BID  . pantoprazole  40 mg Oral QHS  . potassium chloride  50 mEq Oral Once  . sodium chloride  3  mL Intravenous Q12H  . thiamine  100 mg Oral Daily    Time spent on care of this patient: 40 mins   Enrica Corliss, Roselind Messier , MD  [facility] Office  [phone] Pager - [phone]  On-Call/Text Page:      [email or web address]      password [email or web address]  If 7PM-7AM, please contact night-coverage [email or web address] Password [email or web address] 11/12/2014, 9:41 PM   LOS: 2 days   Care during the described time interval was provided by me .  I have reviewed this patient's available data, including medical history, events of note, physical examination, and all test results as part of my evaluation. I have personally reviewed and interpreted all radiology studies.   Carolyne Littles, MD [phone] Pager

## 2014-11-12 NOTE — Progress Notes (Signed)
Utilization Review Completed.Gary [name] T9/01/2015  

## 2014-11-13 DIAGNOSIS — F10231 Alcohol dependence with withdrawal delirium: Secondary | ICD-10-CM

## 2014-11-13 DIAGNOSIS — K7200 Acute and subacute hepatic failure without coma: Secondary | ICD-10-CM

## 2014-11-13 DIAGNOSIS — E876 Hypokalemia: Secondary | ICD-10-CM

## 2014-11-13 DIAGNOSIS — G9340 Encephalopathy, unspecified: Secondary | ICD-10-CM

## 2014-11-13 LAB — CBC WITH DIFFERENTIAL/PLATELET
BASOS ABS: 0 10*3/uL (ref 0.0–0.1)
BASOS PCT: 1 % (ref 0–1)
EOS ABS: 0.1 10*3/uL (ref 0.0–0.7)
EOS PCT: 1 % (ref 0–5)
HCT: 37.3 % — ABNORMAL LOW (ref 39.0–52.0)
Hemoglobin: 12.6 g/dL — ABNORMAL LOW (ref 13.0–17.0)
Lymphocytes Relative: 27 % (ref 12–46)
Lymphs Abs: 1 10*3/uL (ref 0.7–4.0)
MCH: 35 pg — ABNORMAL HIGH (ref 26.0–34.0)
MCHC: 33.8 g/dL (ref 30.0–36.0)
MCV: 103.6 fL — ABNORMAL HIGH (ref 78.0–100.0)
Monocytes Absolute: 0.7 10*3/uL (ref 0.1–1.0)
Monocytes Relative: 19 % — ABNORMAL HIGH (ref 3–12)
Neutro Abs: 2 10*3/uL (ref 1.7–7.7)
Neutrophils Relative %: 53 % (ref 43–77)
PLATELETS: 56 10*3/uL — AB (ref 150–400)
RBC: 3.6 MIL/uL — AB (ref 4.22–5.81)
RDW: 13.1 % (ref 11.5–15.5)
WBC: 3.8 10*3/uL — AB (ref 4.0–10.5)

## 2014-11-13 LAB — COMPREHENSIVE METABOLIC PANEL
ALT: 48 U/L (ref 17–63)
AST: 96 U/L — AB (ref 15–41)
Albumin: 2.8 g/dL — ABNORMAL LOW (ref 3.5–5.0)
Alkaline Phosphatase: 116 U/L (ref 38–126)
Anion gap: 7 (ref 5–15)
BILIRUBIN TOTAL: 2.9 mg/dL — AB (ref 0.3–1.2)
BUN: 6 mg/dL (ref 6–20)
CO2: 21 mmol/L — ABNORMAL LOW (ref 22–32)
CREATININE: 0.74 mg/dL (ref 0.61–1.24)
Calcium: 8.8 mg/dL — ABNORMAL LOW (ref 8.9–10.3)
Chloride: 105 mmol/L (ref 101–111)
GFR calc Af Amer: >60 mL/min (ref >60)
Glucose, Bld: 126 mg/dL — ABNORMAL HIGH (ref 65–99)
Potassium: 3.5 mmol/L (ref 3.5–5.1)
Sodium: 133 mmol/L — ABNORMAL LOW (ref 135–145)
TOTAL PROTEIN: 5.5 g/dL — AB (ref 6.5–8.1)

## 2014-11-13 LAB — AMMONIA: Ammonia: 111 umol/L — ABNORMAL HIGH (ref 9–35)

## 2014-11-13 LAB — PROTIME-INR
INR: 1.4 (ref 0.00–1.49)
PROTHROMBIN TIME: 17.3 s — AB (ref 11.6–15.2)

## 2014-11-13 LAB — MAGNESIUM: MAGNESIUM: 1.5 mg/dL — AB (ref 1.7–2.4)

## 2014-11-13 MED ORDER — MAGNESIUM OXIDE 400 (241.3 MG) MG PO TABS: 400.0000 mg | ORAL_TABLET | Freq: Two times a day (BID) | ORAL | Status: DC

## 2014-11-13 MED ORDER — MAGNESIUM OXIDE 400 (241.3 MG) MG PO TABS
400.0000 mg | ORAL_TABLET | Freq: Two times a day (BID) | ORAL | Status: DC
  Administered 2014-11-13: 400 mg via ORAL
  Filled 2014-11-13: qty 1

## 2014-11-13 MED ORDER — THIAMINE HCL 100 MG PO TABS: 100.0000 mg | ORAL_TABLET | Freq: Every day | ORAL | Status: DC

## 2014-11-13 MED ORDER — LACTULOSE 10 GM/15ML PO SOLN: 30.0000 g | Freq: Three times a day (TID) | ORAL | Status: DC

## 2014-11-13 MED ORDER — POTASSIUM CHLORIDE CRYS ER 20 MEQ PO TBCR: 20.0000 meq | EXTENDED_RELEASE_TABLET | Freq: Two times a day (BID) | ORAL | Status: DC

## 2014-11-13 NOTE — Discharge Summary (Signed)
DISCHARGE SUMMARY  Gary [name]  MR#: [id number]  DOB:1950-04-27  Date of Admission: 11/10/2014 Date of Discharge: 11/13/2014  Attending Physician:Lauranne Beyersdorf T  Patient's NIO:EVOJ-JKKXF,GHWEXH, MD  Consults:  none  Disposition: D/C home   Follow-up Appts:     Follow-up Information    Follow up with OSEI-BONSU,GEORGE, MD In 1 week.   Specialty:  Internal Medicine   Contact information:   [address] [place] [address] [phone]      Tests Needing Follow-up: -recheck ammonia - assess mental status - consider adding rifaximin or stopping lactulose depending on clinical status  -recheck K+ and Mg levels   Discharge Diagnoses: Alcohol withdrawal delirium / Hepatic encephalopathy Elevated ammonia level  Seizure due to EtOH withdrawal  Acute alcoholic hepatitis without ascites  Anemia Thrombocytopenia HTN Hyponatremia  Hypokalemia Hypomagnesemia  Initial presentation: 64 y.o. M Hx long-standing Alcohol abuse, Alcoholic Hepatitis, HTN, CHF who presented with confusion. Per patient he just decided to stop drinking. Per spouse, he usually stops 3 or 4 days every month. Patient was noted to have a seizure, EMS was called, patient was subsequently brought to the emergency room. Patient claims he drinks 2 bottles (24 ounces) daily, but per spouse is close to 4-5 bottles.  Hospital Course:  Alcohol withdrawal delirium / Hepatic encephalopathy -multifactorial to include alcohol abuse, alcoholic hepatitis, elevated ammonia  -placed on Ativan per protocol - requiring very little ativan in 24hrs prior to d/c  -UDS negative -Echocardiogram unrevealing  -patient's cognition improved - pt requested d/c home  -pt advised he should never drink any EtOH ever again  Elevated ammonia level  -Ammonia still elevated despite lactulose to 30 gm TID (see labs below) - ammonia is not following w/ mental status (which has improved despite ammonia remaining high) - will  cont lactulose, but will not yet start rifaximin - f/u ammonia in outpt setting and consider stopping lactulose and following mental status  -CSW consult placed outpatient alcoholic rehabilitation  Seizure  -secondary to alcohol withdrawal -CT head negative -EEG - negative seizures -no further seizures during hospital stay   Acute alcoholic hepatitis without ascites  -LFTs elevated in a pattern consistent with alcoholic hepatitis -PT/INR slightly elevated/WNL respectively  Anemia Thrombocytopenia -Suspect secondary to alcohol / cirrhosis. No evidence of active bleeding.  Avoided heparin/Lovenox.  -Anemia panel w/o evidence of B12 or folic acid deficiency  HTN -cont usual home medications   Hyponatremia  -due to EtOH abuse +/- liver disease - stable   Hypokalemia -improved to 3.5 on day of d/c - due to malnutrition in face of EtOH abuse   Hypomagnesemia -persists - cont to replace as outpt - due to malnutrition in face of EtOH abuse     Medication List    STOP taking these medications        colchicine 0.6 MG tablet     furosemide 40 MG tablet  Commonly known as:  LASIX     HYDROcodone-acetaminophen 5-325 MG per tablet  Commonly known as:  NORCO/VICODIN     predniSONE 20 MG tablet  Commonly known as:  DELTASONE      TAKE these medications        allopurinol 100 MG tablet  Commonly known as:  ZYLOPRIM  Take 1.5 tablets (150 mg total) by mouth daily.     feeding supplement (ENSURE COMPLETE) Liqd  Take 237 mLs by mouth 2 (two) times daily between meals.     FISH OIL PO  Take 1 capsule by mouth daily.  folic acid 1 MG tablet  Commonly known as:  FOLVITE  Take 1 mg by mouth daily.     lactulose 10 GM/15ML solution  Commonly known as:  CHRONULAC  Take 45 mLs (30 g total) by mouth 3 (three) times daily.     lisinopril 20 MG tablet  Commonly known as:  PRINIVIL,ZESTRIL  Take 1 tablet (20 mg total) by mouth daily.     magnesium oxide 400 (241.3 MG)  MG tablet  Commonly known as:  MAG-OX  Take 1 tablet (400 mg total) by mouth 2 (two) times daily.     metoprolol tartrate 25 MG tablet  Commonly known as:  LOPRESSOR  Take 0.5 tablets (12.5 mg total) by mouth 2 (two) times daily.     multivitamin with minerals Tabs tablet  Take 1 tablet by mouth daily.     pantoprazole 40 MG tablet  Commonly known as:  PROTONIX  Take 1 tablet (40 mg total) by mouth daily.     potassium chloride SA 20 MEQ tablet  Commonly known as:  K-DUR,KLOR-CON  Take 1 tablet (20 mEq total) by mouth 2 (two) times daily. Take for 4 days then stop.     thiamine 100 MG tablet  Take 1 tablet (100 mg total) by mouth daily.     Vitamin D (Ergocalciferol) 50000 UNITS Caps capsule  Commonly known as:  DRISDOL  Take 50,000 Units by mouth every 7 (seven) days.        Day of Discharge BP 171/85 mmHg  Pulse 55  Temp(Src) 98.5 F (36.9 C) (Oral)  Resp 19  Ht 5\' 8"  (1.727 m)  Wt 82.7 kg (182 lb 5.1 oz)  BMI 27.73 kg/m2  SpO2 95%  Physical Exam: General: No acute respiratory distress Lungs: Clear to auscultation bilaterally without wheezes or crackles Cardiovascular: Regular rate and rhythm without murmur gallop or rub normal S1 and S2 Abdomen: Nontender, nondistended, soft, bowel sounds positive, no rebound, no ascites, no appreciable mass Extremities: No significant cyanosis, clubbing, or edema bilateral lower extremities  Basic Metabolic Panel:  Recent Labs Lab 11/10/14 1447 11/11/14 0310 11/11/14 1320 11/12/14 1807 11/13/14 0233  NA 131* 131*  --  133* 133*  K 3.8 3.6  --  3.3* 3.5  CL 99* 102  --  104 105  CO2 22 19*  --  21* 21*  GLUCOSE 123* 86  --  101* 126*  BUN 8 7  --  7 6  CREATININE 0.88 0.77  --  0.75 0.74  CALCIUM 8.9 8.1*  --  9.0 8.8*  MG  --   --  1.6* 1.5* 1.5*    Liver Function Tests:  Recent Labs Lab 11/10/14 1447 11/10/14 1722 11/11/14 0310 11/12/14 1807 11/13/14 0233  AST 149*  --  128* 97* 96*  ALT 66*  --  53 47  48  ALKPHOS 128*  --  111 121 116  BILITOT 5.8* 6.0* 5.0* 3.3* 2.9*  PROT 5.9*  --  5.1* 5.3* 5.5*  ALBUMIN 3.2*  --  2.8* 2.8* 2.8*    Recent Labs Lab 11/10/14 1722  LIPASE 52*    Recent Labs Lab 11/10/14 1510 11/11/14 0310 11/12/14 1807 11/13/14 0233  AMMONIA 84* 99* 98* 111*    Coags:  Recent Labs Lab 11/10/14 1749 11/13/14 0233  INR 1.32 1.40   CBC:  Recent Labs Lab 11/10/14 1447 11/11/14 0310 11/12/14 1807 11/13/14 0233  WBC 5.3 3.6* 3.6* 3.8*  NEUTROABS 3.8  --  1.9 2.0  HGB 13.7 12.4* 11.7* 12.6*  HCT 39.9 35.7* 33.7* 37.3*  MCV 102.6* 103.2* 104.0* 103.6*  PLT 41* 38* 43* 56*    Recent Results (from the past 240 hour(s))  MRSA PCR Screening     Status: None   Collection Time: 11/10/14 10:04 PM  Result Value Ref Range Status   MRSA by PCR NEGATIVE NEGATIVE Final    Comment:        The GeneXpert MRSA Assay (FDA approved for NASAL specimens only), is one component of a comprehensive MRSA colonization surveillance program. It is not intended to diagnose MRSA infection nor to guide or monitor treatment for MRSA infections.      Time spent in discharge (includes decision making & examination of pt): >30 minutes  11/13/2014, 11:12 AM   Lonia Blood, MD [facility] Office  [phone] Pager [phone]  On-Call/Text Page:      [email or web address]      password [facility]

## 2014-11-13 NOTE — Discharge Instructions (Signed)
Alcohol Withdrawal  Anytime drug use is interfering with normal living activities it has become abuse. This includes problems with family and friends. Psychological dependence has developed when your mind tells you that the drug is needed. This is usually followed by physical dependence when a continuing increase of drugs are required to get the same feeling or "high." This is known as addiction or chemical dependency. A person's risk is much higher if there is a history of chemical dependency in the family. Mild Withdrawal Following Stopping Alcohol, When Addiction or Chemical Dependency Has Developed When a person has developed tolerance to alcohol, any sudden stopping of alcohol can cause uncomfortable physical symptoms. Most of the time these are mild and consist of tremors in the hands and increases in heart rate, breathing, and temperature. Sometimes these symptoms are associated with anxiety, panic attacks, and bad dreams. There may also be stomach upset. Normal sleep patterns are often interrupted with periods of inability to sleep (insomnia). This may last for 6 months. Because of this discomfort, many people choose to continue drinking to get rid of this discomfort and to try to feel normal. Severe Withdrawal with Decreased or No Alcohol Intake, When Addiction or Chemical Dependency Has Developed About five percent of alcoholics will develop signs of severe withdrawal when they stop using alcohol. One sign of this is development of generalized seizures (convulsions). Other signs of this are severe agitation and confusion. This may be associated with believing in things which are not real or seeing things which are not really there (delusions and hallucinations). Vitamin deficiencies are usually present if alcohol intake has been long-term. Treatment for this most often requires hospitalization and close observation. Addiction can only be helped by stopping use of all chemicals. This is hard but may  save your life. With continual alcohol use, possible outcomes are usually loss of self respect and esteem, violence, and death. Addiction cannot be cured but it can be stopped. This often requires outside help and the care of professionals. Treatment centers are listed in the yellow pages under Cocaine, Narcotics, and Alcoholics Anonymous. Most hospitals and clinics can refer you to a specialized care center. It is not necessary for you to go through the uncomfortable symptoms of withdrawal. Your caregiver can provide you with medicines that will help you through this difficult period. Try to avoid situations, friends, or drugs that made it possible for you to keep using alcohol in the past. Learn how to say no. It takes a long period of time to overcome addictions to all drugs, including alcohol. There may be many times when you feel as though you want a drink. After getting rid of the physical addiction and withdrawal, you will have a lessening of the craving which tells you that you need alcohol to feel normal. Call your caregiver if more support is needed. Learn who to talk to in your family and among your friends so that during these periods you can receive outside help. Alcoholics Anonymous (AA) has helped many people over the years. To get further help, contact AA or call your caregiver, counselor, or clergyperson. Al-Anon and Alateen are support groups for friends and family members of an alcoholic. The people who love and care for an alcoholic often need help, too. For information about these organizations, check your phone directory or call a local alcoholism treatment center.  SEEK IMMEDIATE MEDICAL CARE IF:   You have a seizure.  You have a fever.  You experience uncontrolled vomiting or  you vomit up blood. This may be bright red or look like black coffee grounds.  You have blood in the stool. This may be bright red or appear as a black, tarry, bad-smelling stool.  You become lightheaded or  faint. Do not drive if you feel this way. Have someone else drive you or call 409 for help.  You become more agitated or confused.  You develop uncontrolled anxiety.  You begin to see things that are not really there (hallucinate). Your caregiver has determined that you completely understand your medical condition, and that your mental state is back to normal. You understand that you have been treated for alcohol withdrawal, have agreed not to drink any alcohol for a minimum of 1 day, will not operate a car or other machinery for 24 hours, and have had an opportunity to ask any questions about your condition. Document Released: 11/27/2004 Document Revised: 05/12/2011 Document Reviewed: 10/06/2007 [name] Patient Information 2015 [place], [place]. This information is not intended to replace advice given to you by your health care provider. Make sure you discuss any questions you have with your health care provider.   Finding Treatment for Alcohol and Drug Addiction  It can be hard to find the right place to get professional treatment. Here are some important things to consider:  There are different types of treatment to choose from.  Some programs are live-in (residential) while others are not (outpatient). Sometimes a combination is offered.  No single type of program is right for everyone.  Most treatment programs involve a combination of education, counseling, and a 12-step, spiritually-based approach.  There are non-spiritually based programs (not 12-step).  Some treatment programs are government sponsored. They are geared for patients without private insurance.  Treatment programs can vary in many respects such as:  Cost and types of insurance accepted.  Types of on-site medical services offered.  Length of stay, setting, and size.  Overall philosophy of treatment. A person may need specialized treatment or have needs not addressed by all programs. For example, adolescents  need treatment appropriate for their age. Other people have secondary disorders that must be managed as well. Secondary conditions can include mental illness, such as depression or diabetes. Often, a period of detoxification from alcohol or drugs is needed. This requires medical supervision and not all programs offer this. THINGS TO CONSIDER WHEN SELECTING A TREATMENT PROGRAM   Is the program certified by the appropriate government agency? Even private programs must be certified and employ certified professionals.  Does the program accept your insurance? If not, can a payment plan be set up?  Is the facility clean, organized, and well run? Do they allow you to speak with graduates who can share their treatment experience with you? Can you tour the facility? Can you meet with staff?  Does the program meet the full range of individual needs?  Does the treatment program address sexual orientation and physical disabilities? Do they provide age, gender, and culturally appropriate treatment services?  Is treatment available in languages other than English?  Is long-term aftercare support or guidance encouraged and provided?  Is assessment of an individual's treatment plan ongoing to ensure it meets changing needs?  Does the program use strategies to encourage reluctant patients to remain in treatment long enough to increase the likelihood of success?  Does the program offer counseling (individual or group) and other behavioral therapies?  Does the program offer medicine as part of the treatment regimen, if needed?  Is there ongoing  monitoring of possible relapse? Is there a defined relapse prevention program? Are services or referrals offered to family members to ensure they understand addiction and the recovery process? This would help them support the recovering individual.  Are 12-step meetings held at the center or is transport available for patients to attend outside meetings? In countries  outside of the [place]. and [place], Magazine features editor for contact information for services in your area. Document Released: 01/16/2005 Document Revised: 05/12/2011 Document Reviewed: 07/29/2007 [facility] Patient Information 2015 [place], [place]. This information is not intended to replace advice given to you by your health care provider. Make sure you discuss any questions you have with your health care provider.

## 2014-11-21 ENCOUNTER — Encounter: Payer: Self-pay | Admitting: *Deleted

## 2014-11-21 ENCOUNTER — Encounter: Payer: Self-pay | Admitting: Gastroenterology

## 2014-11-21 ENCOUNTER — Other Ambulatory Visit (INDEPENDENT_AMBULATORY_CARE_PROVIDER_SITE_OTHER): Payer: 59

## 2014-11-21 ENCOUNTER — Ambulatory Visit (INDEPENDENT_AMBULATORY_CARE_PROVIDER_SITE_OTHER): Payer: 59 | Admitting: Gastroenterology

## 2014-11-21 VITALS — BP 100/60 | HR 80 | Ht 68.0 in | Wt 181.1 lb

## 2014-11-21 DIAGNOSIS — F1010 Alcohol abuse, uncomplicated: Secondary | ICD-10-CM | POA: Diagnosis not present

## 2014-11-21 DIAGNOSIS — R945 Abnormal results of liver function studies: Principal | ICD-10-CM

## 2014-11-21 DIAGNOSIS — R7989 Other specified abnormal findings of blood chemistry: Secondary | ICD-10-CM

## 2014-11-21 DIAGNOSIS — K759 Inflammatory liver disease, unspecified: Secondary | ICD-10-CM

## 2014-11-21 HISTORY — DX: Inflammatory liver disease, unspecified: K75.9

## 2014-11-21 LAB — COMPREHENSIVE METABOLIC PANEL
ALBUMIN: 3.9 g/dL (ref 3.5–5.2)
ALK PHOS: 128 U/L — AB (ref 39–117)
ALT: 51 U/L (ref 0–53)
AST: 85 U/L — ABNORMAL HIGH (ref 0–37)
BUN: 10 mg/dL (ref 6–23)
CHLORIDE: 104 meq/L (ref 96–112)
CO2: 23 mEq/L (ref 19–32)
Calcium: 9.6 mg/dL (ref 8.4–10.5)
Creatinine, Ser: 0.83 mg/dL (ref 0.40–1.50)
GFR: 98.96 mL/min (ref >60.00)
GLUCOSE: 78 mg/dL (ref 70–99)
POTASSIUM: 3.8 meq/L (ref 3.5–5.1)
SODIUM: 138 meq/L (ref 135–145)
TOTAL PROTEIN: 7.1 g/dL (ref 6.0–8.3)
Total Bilirubin: 1.9 mg/dL — ABNORMAL HIGH (ref 0.2–1.2)

## 2014-11-21 LAB — CBC WITH DIFFERENTIAL/PLATELET
BASOS PCT: 0.2 % (ref 0.0–3.0)
Basophils Absolute: 0 10*3/uL (ref 0.0–0.1)
EOS PCT: 0.6 % (ref 0.0–5.0)
Eosinophils Absolute: 0.1 10*3/uL (ref 0.0–0.7)
HCT: 46.6 % (ref 39.0–52.0)
Hemoglobin: 15.6 g/dL (ref 13.0–17.0)
LYMPHS ABS: 1.5 10*3/uL (ref 0.7–4.0)
Lymphocytes Relative: 16.3 % (ref 12.0–46.0)
MCHC: 33.5 g/dL (ref 30.0–36.0)
MCV: 107.1 fl — AB (ref 78.0–100.0)
MONO ABS: 0.4 10*3/uL (ref 0.1–1.0)
MONOS PCT: 4.8 % (ref 3.0–12.0)
NEUTROS PCT: 78.1 % — AB (ref 43.0–77.0)
Neutro Abs: 7 10*3/uL (ref 1.4–7.7)
Platelets: 217 10*3/uL (ref 150.0–400.0)
RBC: 4.35 Mil/uL (ref 4.22–5.81)
RDW: 14.2 % (ref 11.5–15.5)
WBC: 9 10*3/uL (ref 4.0–10.5)

## 2014-11-21 LAB — PROTIME-INR
INR: 1.3 ratio — AB (ref 0.8–1.0)
Prothrombin Time: 14.4 s — ABNORMAL HIGH (ref 9.6–13.1)

## 2014-11-21 NOTE — Progress Notes (Signed)
     11/21/2014 Gary [name] [id number] Dec 10, 1950   History of Present Illness:  This is a 64 year old male who is known to Dr. Leone Payor and previously seen by one of our NP's for evaluation of abnormal LFTs in the setting of alcoholism. Patient was admitted in 01/2014 with altered mental status and jaundice. Alcohol level on admission was 392. Acute hepatitis panel negative. Bili on admission was 26, alkaline phosphatase 437 , AST 360, ALP 120. Ultrasound the abdomen showed densely echogenic enlarged liver consistent with hepatitis. Small amount of sludge in the gallbladder, no biliary duct dilation. Hospitalist initiated steroid for alcoholic hepatitis. Within a couple of days the total bilirubin had declined to 17. His alkaline phosphatase had decreased by nearly half and transaminases had significantly improved as well.  He was seen in follow-up here in 03/2014 and was told to return in 3 more weeks, but patient failed to do so.  He started drinking again at some point and was admitted for DT's earlier this month.  Was told to follow-up here once again for elevated LFT's.  On admission 11/10/2014 AST was 149, ALT 66, ALP 128, and total bili was 5.8.  They were already much improved prior to discharge.  Platelets were low at 56, INR was 1.4.  He is on lactulose TID for elevated ammonia levels; says that he is having 4-5 BM's daily.  Has not drank any ETOH for the past 2 weeks since admission.  Tells me that he feels fine.  No GI complaints.   Current Medications, Allergies, Past Medical History, Past Surgical History, Family History and Social History were reviewed in [employer] record.   Physical Exam: BP 100/60 mmHg  Pulse 80  Ht 5\' 8"  (1.727 m)  Wt 181 lb 2 oz (82.158 kg)  BMI 27.55 kg/m2 General: Well developed white male in no acute distress; ambulates with a cane Head: Normocephalic and atraumatic Eyes:  Sclerae anicteric, conjunctiva pink  Ears: Normal auditory  acuity Lungs: Clear throughout to auscultation Heart: Regular rate and rhythm Abdomen: Soft, non-distended.  Normal bowel sounds.  Non-tender.  Umbilical hernia noted. Musculoskeletal: Symmetrical with no gross deformities  Extremities: No edema  Neurological: Alert oriented x 4, grossly non-focal; no asterixis noted Psychological:  Alert and cooperative. Normal mood and affect  Assessment and Recommendations: *64 year old male with recent admission for DT's.  Has ETOH hepatitis and had been treated with steroids in the past.  LFT's elevated but ok.  Ultrasound 01/2014 not suggestive of cirrhosis. No stigmata of chronic liver disease on exam. He is mildly coagulopathic and platelets had been low but both could be attributed to ETOH use.   Repeat CBC, CMP, PT/INR today.  Needs to avoid ETOH.  Elevated ammonia levels have lead to use of lactulose.  Will continue for now, titrated to 3-4 soft BM's daily.  Follow-up in 8-10 weeks with Dr. Leone Payor.

## 2014-11-21 NOTE — Patient Instructions (Signed)
Please go to the basement level to have your labs drawn.   We made you a follow up appointment with Dr. Leone Payor on 02-01-2015 at 9:45 am.

## 2014-12-03 NOTE — Progress Notes (Signed)
Agree with management. Note that CT angio of chest has shown nodularity of liver and suspected recanalization of umbilical vein so cirrhosis possible.  Hopefully he will stay off EtOH and stabilize or improve.  Will plan for complete abd [place] and hepatic elastography upon return. Could need a screening EGD also. He has f/u 02/01/15 Iva Boop, MD, [facility]

## 2015-02-01 ENCOUNTER — Encounter: Payer: Self-pay | Admitting: Internal Medicine

## 2015-02-01 ENCOUNTER — Ambulatory Visit: Payer: Self-pay | Admitting: Internal Medicine

## 2015-02-01 NOTE — Progress Notes (Signed)
Patient ID: Gary [name], male   DOB: 10/11/1950, 64 y.o.   MRN: [id number] The patient's chart has been reviewed by Dr. Leone Payor  and the recommendations are noted below.     Follow-up advised. Schedule patient for next available appointment. Outcome of communication with the patient: letter mailed and [facility] notified as well, he sees the Zoe Lan , FNP there.

## 2015-04-05 ENCOUNTER — Emergency Department (HOSPITAL_COMMUNITY): Payer: Medicare Other

## 2015-04-05 ENCOUNTER — Encounter (HOSPITAL_COMMUNITY): Payer: Self-pay | Admitting: Emergency Medicine

## 2015-04-05 ENCOUNTER — Inpatient Hospital Stay (HOSPITAL_COMMUNITY)
Admission: EM | Admit: 2015-04-05 | Discharge: 2015-04-08 | DRG: 158 | Disposition: A | Discharge status: Home Health | Payer: Medicare Other | Attending: General Surgery | Admitting: General Surgery

## 2015-04-05 DIAGNOSIS — Z811 Family history of alcohol abuse and dependence: Secondary | ICD-10-CM

## 2015-04-05 DIAGNOSIS — I9589 Other hypotension: Secondary | ICD-10-CM

## 2015-04-05 DIAGNOSIS — S01112A Laceration without foreign body of left eyelid and periocular area, initial encounter: Secondary | ICD-10-CM | POA: Diagnosis present

## 2015-04-05 DIAGNOSIS — E559 Vitamin D deficiency, unspecified: Secondary | ICD-10-CM | POA: Diagnosis present

## 2015-04-05 DIAGNOSIS — I10 Essential (primary) hypertension: Secondary | ICD-10-CM | POA: Diagnosis present

## 2015-04-05 DIAGNOSIS — S02401A Maxillary fracture, unspecified, initial encounter for closed fracture: Secondary | ICD-10-CM

## 2015-04-05 DIAGNOSIS — K7030 Alcoholic cirrhosis of liver without ascites: Secondary | ICD-10-CM | POA: Diagnosis present

## 2015-04-05 DIAGNOSIS — F10231 Alcohol dependence with withdrawal delirium: Secondary | ICD-10-CM | POA: Diagnosis present

## 2015-04-05 DIAGNOSIS — D539 Nutritional anemia, unspecified: Secondary | ICD-10-CM | POA: Diagnosis present

## 2015-04-05 DIAGNOSIS — R040 Epistaxis: Secondary | ICD-10-CM | POA: Diagnosis present

## 2015-04-05 DIAGNOSIS — W010XXA Fall on same level from slipping, tripping and stumbling without subsequent striking against object, initial encounter: Secondary | ICD-10-CM | POA: Diagnosis present

## 2015-04-05 DIAGNOSIS — R402412 Glasgow coma scale score 13-15, at arrival to emergency department: Secondary | ICD-10-CM | POA: Diagnosis present

## 2015-04-05 DIAGNOSIS — W19XXXA Unspecified fall, initial encounter: Secondary | ICD-10-CM

## 2015-04-05 DIAGNOSIS — S0240DA Maxillary fracture, left side, initial encounter for closed fracture: Principal | ICD-10-CM | POA: Diagnosis present

## 2015-04-05 DIAGNOSIS — M1990 Unspecified osteoarthritis, unspecified site: Secondary | ICD-10-CM | POA: Diagnosis present

## 2015-04-05 DIAGNOSIS — T1490XA Injury, unspecified, initial encounter: Secondary | ICD-10-CM

## 2015-04-05 DIAGNOSIS — I509 Heart failure, unspecified: Secondary | ICD-10-CM | POA: Diagnosis present

## 2015-04-05 DIAGNOSIS — E7800 Pure hypercholesterolemia, unspecified: Secondary | ICD-10-CM | POA: Diagnosis present

## 2015-04-05 DIAGNOSIS — D696 Thrombocytopenia, unspecified: Secondary | ICD-10-CM | POA: Diagnosis present

## 2015-04-05 DIAGNOSIS — D62 Acute posthemorrhagic anemia: Secondary | ICD-10-CM | POA: Diagnosis present

## 2015-04-05 HISTORY — DX: Tremor, unspecified: R25.1

## 2015-04-05 HISTORY — DX: Maxillary fracture, unspecified side, initial encounter for closed fracture: S02.401A

## 2015-04-05 LAB — TYPE AND SCREEN
ABO/RH(D): A POS
Antibody Screen: NEGATIVE
UNIT DIVISION: 0
UNIT DIVISION: 0

## 2015-04-05 LAB — PREPARE FRESH FROZEN PLASMA
UNIT DIVISION: 0
Unit division: 0

## 2015-04-05 LAB — COMPREHENSIVE METABOLIC PANEL
ALBUMIN: 2.4 g/dL — AB (ref 3.5–5.0)
ALT: 35 U/L (ref 17–63)
ANION GAP: 14 (ref 5–15)
AST: 121 U/L — ABNORMAL HIGH (ref 15–41)
Alkaline Phosphatase: 127 U/L — ABNORMAL HIGH (ref 38–126)
BILIRUBIN TOTAL: 1.8 mg/dL — AB (ref 0.3–1.2)
BUN: 6 mg/dL (ref 6–20)
CHLORIDE: 108 mmol/L (ref 101–111)
CO2: 18 mmol/L — ABNORMAL LOW (ref 22–32)
Calcium: 6.3 mg/dL — CL (ref 8.9–10.3)
Creatinine, Ser: 0.98 mg/dL (ref 0.61–1.24)
GFR calc Af Amer: >60 mL/min (ref >60)
GLUCOSE: 102 mg/dL — AB (ref 65–99)
POTASSIUM: 2.9 mmol/L — AB (ref 3.5–5.1)
Sodium: 140 mmol/L (ref 135–145)
TOTAL PROTEIN: 4.5 g/dL — AB (ref 6.5–8.1)

## 2015-04-05 LAB — CBC
HEMATOCRIT: 29.5 % — AB (ref 39.0–52.0)
HEMATOCRIT: 30.6 % — AB (ref 39.0–52.0)
Hemoglobin: 10.2 g/dL — ABNORMAL LOW (ref 13.0–17.0)
Hemoglobin: 10.9 g/dL — ABNORMAL LOW (ref 13.0–17.0)
MCH: 36 pg — ABNORMAL HIGH (ref 26.0–34.0)
MCH: 36.8 pg — AB (ref 26.0–34.0)
MCHC: 34.6 g/dL (ref 30.0–36.0)
MCHC: 35.6 g/dL (ref 30.0–36.0)
MCV: 104.2 fL — ABNORMAL HIGH (ref 78.0–100.0)
MCV: 103.4 fL — AB (ref 78.0–100.0)
PLATELETS: 43 10*3/uL — AB (ref 150–400)
PLATELETS: 34 10*3/uL — AB (ref 150–400)
RBC: 2.83 MIL/uL — ABNORMAL LOW (ref 4.22–5.81)
RBC: 2.96 MIL/uL — AB (ref 4.22–5.81)
RDW: 14.5 % (ref 11.5–15.5)
RDW: 14.6 % (ref 11.5–15.5)
WBC: 4.6 10*3/uL (ref 4.0–10.5)
WBC: 6.9 10*3/uL (ref 4.0–10.5)

## 2015-04-05 LAB — ABO/RH: ABO/RH(D): A POS

## 2015-04-05 LAB — PROTIME-INR
INR: 1.58 — ABNORMAL HIGH (ref 0.00–1.49)
PROTHROMBIN TIME: 18.9 s — AB (ref 11.6–15.2)

## 2015-04-05 MED ORDER — IOHEXOL 300 MG/ML  SOLN
100.0000 mL | Freq: Once | INTRAMUSCULAR | Status: AC | PRN
  Administered 2015-04-05: 100 mL via INTRAVENOUS

## 2015-04-05 MED ORDER — SODIUM CHLORIDE 0.9 % IV SOLN
INTRAVENOUS | Status: AC | PRN
  Administered 2015-04-05: 1000 mL via INTRAVENOUS

## 2015-04-05 MED ORDER — SODIUM CHLORIDE 0.9 % IV BOLUS (SEPSIS)
1000.0000 mL | Freq: Once | INTRAVENOUS | Status: AC
  Administered 2015-04-05: 1000 mL via INTRAVENOUS

## 2015-04-05 NOTE — ED Notes (Signed)
Gary [name], neighbor called and left her number if pt needs anything  [phone]

## 2015-04-05 NOTE — H&P (Signed)
Gary [name] is an 65 y.o. male.   Chief Complaint: Fall with nasal bleeding HPI: Gary [name] was standing in his kitchen when he tripped over his dog. He fell down hitting his face. He had a significant blood loss in his kitchen before he was transported to the emergency department. He was made a level one trauma on arrival due to hypotension. He was downgraded shortly thereafter. Workup revealed left maxillary sinus fracture. He also had acute blood loss anemia and some intermittent hypotension which responded to fluid boluses. Full evaluation revealed only the maxillary sinus fracture on the left side. Due to the intermittent hypotension I was asked to see him for admission.  Past Medical History  Diagnosis Date  . Hypertension   . Arthritis   . Alcohol abuse   . CHF (congestive heart failure) ([place])   . Elevated liver enzymes   . Alcoholic cirrhosis of liver without ascites ([place])   . Thrombocytopenia ([place])   . Acute hepatitis   . Vitamin D deficiency   . Hypercholesterolemia   . Macrocytic anemia     Past Surgical History  Procedure Laterality Date  . No past surgeries      Family History  Problem Relation Age of Onset  . Heart failure Father   . Colon cancer Neg Hx   . Colon polyps Neg Hx   . Diabetes Neg Hx   . Kidney disease Neg Hx   . Gallbladder disease Neg Hx   . Esophageal cancer Neg Hx   . Alcohol abuse Father   . COPD Mother   . Heart attack Father    Social History:  reports that he has never smoked. He has never used smokeless tobacco. He reports that he drinks about 4.8 oz of alcohol per week. He reports that he does not use illicit drugs.  Allergies: No Known Allergies   (Not in a hospital admission)  Results for orders placed or performed during the hospital encounter of 04/05/15 (from the past 48 hour(s))  Prepare fresh frozen plasma     Status: None   Collection Time: 04/05/15  6:30 PM  Result Value Ref Range   Unit Number [id number]    Blood Component  Type LIQ PLASMA    Unit division 00    Status of Unit REL FROM [facility]    Unit tag comment VERBAL ORDERS PER DR GLICK    Transfusion Status OK TO TRANSFUSE    Unit Number [id number]    Blood Component Type LIQ PLASMA    Unit division 00    Status of Unit REL FROM [facility]    Unit tag comment VERBAL ORDERS PER DR Roxanne Mins    Transfusion Status OK TO TRANSFUSE   Type and screen     Status: None   Collection Time: 04/05/15  6:40 PM  Result Value Ref Range   ABO/RH(D) A POS    Antibody Screen NEG    Sample Expiration 04/08/2015    Unit Number [id number]    Blood Component Type RED CELLS,LR    Unit division 00    Status of Unit REL FROM [facility]    Unit tag comment VERBAL ORDERS PER DR GLICK    Transfusion Status OK TO TRANSFUSE    Crossmatch Result NOT NEEDED    Unit Number [id number]    Blood Component Type RBC LR PHER2    Unit division 00    Status of Unit REL FROM [facility]    Unit tag comment VERBAL ORDERS PER  DR Roxanne Mins    Transfusion Status OK TO TRANSFUSE    Crossmatch Result NOT NEEDED   CBC     Status: Abnormal   Collection Time: 04/05/15  6:40 PM  Result Value Ref Range   WBC 4.6 4.0 - 10.5 K/uL   RBC 2.83 (L) 4.22 - 5.81 MIL/uL   Hemoglobin 10.2 (L) 13.0 - 17.0 g/dL   HCT 29.5 (L) 39.0 - 52.0 %   MCV 104.2 (H) 78.0 - 100.0 fL   MCH 36.0 (H) 26.0 - 34.0 pg   MCHC 34.6 30.0 - 36.0 g/dL   RDW 14.5 11.5 - 15.5 %   Platelets 43 (L) 150 - 400 K/uL    Comment: REPEATED TO VERIFY SPECIMEN CHECKED FOR CLOTS PLATELET COUNT CONFIRMED BY SMEAR   Comprehensive metabolic panel     Status: Abnormal   Collection Time: 04/05/15  6:40 PM  Result Value Ref Range   Sodium 140 135 - 145 mmol/L   Potassium 2.9 (L) 3.5 - 5.1 mmol/L   Chloride 108 101 - 111 mmol/L   CO2 18 (L) 22 - 32 mmol/L   Glucose, Bld 102 (H) 65 - 99 mg/dL   BUN 6 6 - 20 mg/dL   Creatinine, Ser 0.98 0.61 - 1.24 mg/dL   Calcium 6.3 (LL) 8.9 - 10.3 mg/dL    Comment: CRITICAL RESULT CALLED TO, READ BACK BY AND  VERIFIED WITH: S. MIZE RN [phone]  [address] R    Total Protein 4.5 (L) 6.5 - 8.1 g/dL   Albumin 2.4 (L) 3.5 - 5.0 g/dL   AST 121 (H) 15 - 41 U/L   ALT 35 17 - 63 U/L   Alkaline Phosphatase 127 (H) 38 - 126 U/L   Total Bilirubin 1.8 (H) 0.3 - 1.2 mg/dL   GFR calc non Af Amer >60 >60 mL/min   GFR calc Af Amer >60 >60 mL/min    Comment: (NOTE) The eGFR has been calculated using the CKD EPI equation. This calculation has not been validated in all clinical situations. eGFR's persistently <60 mL/min signify possible Chronic Kidney Disease.    Anion gap 14 5 - 15  Protime-INR     Status: Abnormal   Collection Time: 04/05/15  6:40 PM  Result Value Ref Range   Prothrombin Time 18.9 (H) 11.6 - 15.2 seconds   INR 1.58 (H) 0.00 - 1.49  ABO/Rh     Status: None   Collection Time: 04/05/15  6:40 PM  Result Value Ref Range   ABO/RH(D) A POS   CBC     Status: Abnormal   Collection Time: 04/05/15  9:21 PM  Result Value Ref Range   WBC 6.9 4.0 - 10.5 K/uL   RBC 2.96 (L) 4.22 - 5.81 MIL/uL   Hemoglobin 10.9 (L) 13.0 - 17.0 g/dL   HCT 30.6 (L) 39.0 - 52.0 %   MCV 103.4 (H) 78.0 - 100.0 fL   MCH 36.8 (H) 26.0 - 34.0 pg   MCHC 35.6 30.0 - 36.0 g/dL   RDW 14.6 11.5 - 15.5 %   Platelets 34 (L) 150 - 400 K/uL    Comment: REPEATED TO VERIFY SPECIMEN CHECKED FOR CLOTS CONSISTENT WITH PREVIOUS RESULT    Ct Head Wo Contrast  04/05/2015  CLINICAL DATA:  Fall in kitchen floor after tripping over dog. Incontinent of stool and urine. Multiple lacerations and hematomas about the head and face. EXAM: CT HEAD WITHOUT CONTRAST CT MAXILLOFACIAL WITHOUT CONTRAST CT CERVICAL SPINE WITHOUT CONTRAST TECHNIQUE: Multidetector  CT imaging of the head, cervical spine, and maxillofacial structures were performed using the standard protocol without intravenous contrast. Multiplanar CT image reconstructions of the cervical spine and maxillofacial structures were also generated. COMPARISON:  Head CT 11/10/2014 FINDINGS: CT  HEAD FINDINGS No intracranial hemorrhage, mass effect, or midline shift. Atrophy, stable from prior but advanced for age. No hydrocephalus. The basilar cisterns are patent. No evidence of territorial infarct. No intracranial fluid collection. Calvarium is intact. The mastoid air cells are well aerated. CT MAXILLOFACIAL FINDINGS There is blood with fluid level in the left maxillary sinus. A displaced associated fractures not seen, there is a questionable nondisplaced fracture of the medial wall of the left maxillary sinus. Both orbits and globes appear intact without orbital fracture. Near complete opacification of the lower ethmoid sinus and nasal cavity with high-density material. Soft tissue injury to the right nare. Probable blood in the dependent pharynx. Mucosal thickening and small fluid levels in the frontal sinuses without acute fracture. Zygomatic arches, mandibles, nasal bone intact. No fracture through the pterygoid plates. CT CERVICAL SPINE FINDINGS Cervical spine alignment is maintained. Vertebral body heights are preserved. There is no fracture. The dens is intact. There are no jumped or perched facets. Disc space narrowing at C4-C5, C5-C6, and C6-C7 with associated endplate spurs. Scattered facet arthropathy. No prevertebral soft tissue edema. IMPRESSION: 1. No acute intracranial abnormality. Stable but advanced for age atrophy. 2. Blood in the left maxillary sinus with questionable nondisplaced fracture of the medial wall. Blood in the lower ethmoid air cells and nasal cavity without defined nasal bone fracture. Soft tissue injury to the right nare. 3. Degenerative change in the cervical spine without acute fracture or subluxation. Electronically Signed   By: Jeb Levering M.D.   On: 04/05/2015 20:08   Ct Chest W Contrast  04/05/2015  CLINICAL DATA:  Hypotension. EXAM: CT CHEST, ABDOMEN, AND PELVIS WITH CONTRAST TECHNIQUE: Multidetector CT imaging of the chest, abdomen and pelvis was performed  following the standard protocol during bolus administration of intravenous contrast. CONTRAST:  185m OMNIPAQUE IOHEXOL 300 MG/ML  SOLN COMPARISON:  04/25/2014 FINDINGS: CT CHEST Mediastinum: Normal heart size. No pericardial effusion. Aortic atherosclerosis noted. Calcification within the RCA and LAD coronary artery noted. The trachea is patent and is midline. Normal appearance of the esophagus. No mediastinal or hilar adenopathy. There is no axillary or supraclavicular adenopathy. Lungs/Pleura: No pleural fluid. No airspace consolidation. No pulmonary edema. The lungs are clear. Musculoskeletal: Chronic healed right posterior rib fractures are identified. No acute bone abnormalities noted. CT ABDOMEN AND PELVIS Hepatobiliary: There is hypertrophy of the lateral segment and caudate lobes of liver. Diffuse low attenuation throughout the liver is identified. Recanalization of the umbilical vein is noted. Within the left lobe of liver there is a low attenuation structure measuring 2 cm. Previously 1.6 cm. A second focus of low density is identified within the right lobe of liver measuring 2.6 cm. Not seen on previous exam. Gallbladder is negative. No biliary dilatation. Pancreas: Normal appearance of the pancreas. Spleen: The spleen is normal. Adrenals/Urinary Tract: Normal adrenal glands. Unremarkable appearance of the kidneys. The urinary bladder appears normal. Stomach/Bowel: The stomach appears normal. The scratch set there is a variant of a malrotation deformity. Although the third portion of the duodenum crosses the midline the jejunum is largely situated in the right abdomen. No dilated loops of small bowel identified to suggest volvulus. The cecum is in knee normal anatomic location within the right lower quadrant of the abdomen.  The appendix is visualized and appears normal. No pathologic dilatation of the large bowel loops. Numerous colonic diverticula noted without acute inflammation. Vascular/Lymphatic:  Calcified atherosclerotic disease involves the abdominal aorta. No aneurysm. No enlarged upper abdominal lymph nodes identified. No pelvic or inguinal adenopathy. Reproductive: The prostate gland and seminal vesicles are unremarkable. Other: There is mild ascites within the upper abdomen. 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 This is most advanced at the L5-S1 level. There is asymmetric left-sided L5 pars defect. IMPRESSION: 1. No acute cardiopulmonary abnormalities identified. 2. Morphologic features of the liver compatible with cirrhosis. There is evidence of portal venous hypertension with ascites and re- cannulization of the umbilical vein. 3. Malrotation variant. The proximal small bowel loops including the jejunum are largely situated within the right abdomen. No evidence for volvulus. 4. Several indeterminate low-attenuation structures are identified in the liver. In this patient who is at increased risk for liver malignancy further evaluation with nonemergent MRI of the liver with contrast material is recommended. 5. Aortic atherosclerosis 6. Lumbar spondylosis. Electronically Signed   By: Kerby Moors M.D.   On: 04/05/2015 22:47   Ct Cervical Spine Wo Contrast  04/05/2015  CLINICAL DATA:  Fall in kitchen floor after tripping over dog. Incontinent of stool and urine. Multiple lacerations and hematomas about the head and face. EXAM: CT HEAD WITHOUT CONTRAST CT MAXILLOFACIAL WITHOUT CONTRAST CT CERVICAL SPINE WITHOUT CONTRAST TECHNIQUE: Multidetector CT imaging of the head, cervical spine, and maxillofacial structures were performed using the standard protocol without intravenous contrast. Multiplanar CT image reconstructions of the cervical spine and maxillofacial structures were also generated. COMPARISON:  Head CT 11/10/2014 FINDINGS: CT HEAD FINDINGS No intracranial hemorrhage, mass effect, or midline shift. Atrophy, stable from prior but advanced for age. No  hydrocephalus. The basilar cisterns are patent. No evidence of territorial infarct. No intracranial fluid collection. Calvarium is intact. The mastoid air cells are well aerated. CT MAXILLOFACIAL FINDINGS There is blood with fluid level in the left maxillary sinus. A displaced associated fractures not seen, there is a questionable nondisplaced fracture of the medial wall of the left maxillary sinus. Both orbits and globes appear intact without orbital fracture. Near complete opacification of the lower ethmoid sinus and nasal cavity with high-density material. Soft tissue injury to the right nare. Probable blood in the dependent pharynx. Mucosal thickening and small fluid levels in the frontal sinuses without acute fracture. Zygomatic arches, mandibles, nasal bone intact. No fracture through the pterygoid plates. CT CERVICAL SPINE FINDINGS Cervical spine alignment is maintained. Vertebral body heights are preserved. There is no fracture. The dens is intact. There are no jumped or perched facets. Disc space narrowing at C4-C5, C5-C6, and C6-C7 with associated endplate spurs. Scattered facet arthropathy. No prevertebral soft tissue edema. IMPRESSION: 1. No acute intracranial abnormality. Stable but advanced for age atrophy. 2. Blood in the left maxillary sinus with questionable nondisplaced fracture of the medial wall. Blood in the lower ethmoid air cells and nasal cavity without defined nasal bone fracture. Soft tissue injury to the right nare. 3. Degenerative change in the cervical spine without acute fracture or subluxation. Electronically Signed   By: Jeb Levering M.D.   On: 04/05/2015 20:08   Ct Abdomen Pelvis W Contrast  04/05/2015  CLINICAL DATA:  Hypotension. EXAM: CT CHEST, ABDOMEN, AND PELVIS WITH CONTRAST TECHNIQUE: Multidetector CT imaging of the chest, abdomen and pelvis was performed following the standard protocol during bolus administration of intravenous contrast. CONTRAST:  132m OMNIPAQUE  IOHEXOL 300 MG/ML  SOLN COMPARISON:  04/25/2014 FINDINGS: CT CHEST Mediastinum: Normal heart size. No pericardial effusion. Aortic atherosclerosis noted. Calcification within the RCA and LAD coronary artery noted. The trachea is patent and is midline. Normal appearance of the esophagus. No mediastinal or hilar adenopathy. There is no axillary or supraclavicular adenopathy. Lungs/Pleura: No pleural fluid. No airspace consolidation. No pulmonary edema. The lungs are clear. Musculoskeletal: Chronic healed right posterior rib fractures are identified. No acute bone abnormalities noted. CT ABDOMEN AND PELVIS Hepatobiliary: There is hypertrophy of the lateral segment and caudate lobes of liver. Diffuse low attenuation throughout the liver is identified. Recanalization of the umbilical vein is noted. Within the left lobe of liver there is a low attenuation structure measuring 2 cm. Previously 1.6 cm. A second focus of low density is identified within the right lobe of liver measuring 2.6 cm. Not seen on previous exam. Gallbladder is negative. No biliary dilatation. Pancreas: Normal appearance of the pancreas. Spleen: The spleen is normal. Adrenals/Urinary Tract: Normal adrenal glands. Unremarkable appearance of the kidneys. The urinary bladder appears normal. Stomach/Bowel: The stomach appears normal. The scratch set there is a variant of a malrotation deformity. Although the third portion of the duodenum crosses the midline the jejunum is largely situated in the right abdomen. No dilated loops of small bowel identified to suggest volvulus. The cecum is in knee normal anatomic location within the right lower quadrant of the abdomen. The appendix is visualized and appears normal. No pathologic dilatation of the large bowel loops. Numerous colonic diverticula noted without acute inflammation. Vascular/Lymphatic: Calcified atherosclerotic disease involves the abdominal aorta. No aneurysm. No enlarged upper abdominal lymph  nodes identified. No pelvic or inguinal adenopathy. Reproductive: The prostate gland and seminal vesicles are unremarkable. Other: There is mild ascites within the upper abdomen. 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 This is most advanced at the L5-S1 level. There is asymmetric left-sided L5 pars defect. IMPRESSION: 1. No acute cardiopulmonary abnormalities identified. 2. Morphologic features of the liver compatible with cirrhosis. There is evidence of portal venous hypertension with ascites and re- cannulization of the umbilical vein. 3. Malrotation variant. The proximal small bowel loops including the jejunum are largely situated within the right abdomen. No evidence for volvulus. 4. Several indeterminate low-attenuation structures are identified in the liver. In this patient who is at increased risk for liver malignancy further evaluation with nonemergent MRI of the liver with contrast material is recommended. 5. Aortic atherosclerosis 6. Lumbar spondylosis. Electronically Signed   By: Kerby Moors M.D.   On: 04/05/2015 22:47   Dg Pelvis Portable  04/05/2015  CLINICAL DATA:  Mechanical fall in his kitchen, landing on his face. EXAM: PORTABLE PELVIS 1-2 VIEWS COMPARISON:  None. FINDINGS: There is no evidence of pelvic fracture or dislocation. There are degenerative joint changes of bilateral hips with narrowed joint space. There is increased sclerosis mixed with lucency with flattening deformity of the superior right femoral head raising the question of avascular necrosis. IMPRESSION: No acute fracture or dislocation. Electronically Signed   By: Abelardo Diesel M.D.   On: 04/05/2015 19:23   Dg Chest Portable 1 View  04/05/2015  CLINICAL DATA:  The patient fell in his kitchen this evening with a blow to the face. Anterior left shoulder pain and bruising. Initial encounter. EXAM: PORTABLE CHEST 1 VIEW COMPARISON:  Single view of the chest and CT chest 04/25/2014.  FINDINGS: Remote right clavicle fracture is again seen. The patient also has multiple  remote right rib fractures. There is cardiomegaly without edema. The lungs are clear. No pneumothorax or pleural effusion. IMPRESSION: Cardiomegaly without acute disease. Remote right clavicle and rib fractures. Electronically Signed   By: Inge Rise M.D.   On: 04/05/2015 19:23   Dg Shoulder Left Port  04/05/2015  CLINICAL DATA:  Trauma level 2. Pt reports mechanical fall in his kitchen, landing on his face. Pt states he laid in the floor for an hour. Pt c/o anterior left shoulder bruising. Denies chest complaints. Pt also states he is aware of needing a right hip replacement. EXAM: LEFT SHOULDER - 1 VIEW COMPARISON:  Chest radiograph, 04/25/2014 FINDINGS: No acute fracture or dislocation. AC joint is widened with the clavicle lying superior to the acromion. This is consistent with an old AC joint separation. It is similar to the prior chest radiograph. Soft tissues are unremarkable. IMPRESSION: No fracture or acute finding. Electronically Signed   By: Lajean Manes M.D.   On: 04/05/2015 19:24   Ct Maxillofacial Wo Cm  04/05/2015  CLINICAL DATA:  Fall in kitchen floor after tripping over dog. Incontinent of stool and urine. Multiple lacerations and hematomas about the head and face. EXAM: CT HEAD WITHOUT CONTRAST CT MAXILLOFACIAL WITHOUT CONTRAST CT CERVICAL SPINE WITHOUT CONTRAST TECHNIQUE: Multidetector CT imaging of the head, cervical spine, and maxillofacial structures were performed using the standard protocol without intravenous contrast. Multiplanar CT image reconstructions of the cervical spine and maxillofacial structures were also generated. COMPARISON:  Head CT 11/10/2014 FINDINGS: CT HEAD FINDINGS No intracranial hemorrhage, mass effect, or midline shift. Atrophy, stable from prior but advanced for age. No hydrocephalus. The basilar cisterns are patent. No evidence of territorial infarct. No intracranial fluid  collection. Calvarium is intact. The mastoid air cells are well aerated. CT MAXILLOFACIAL FINDINGS There is blood with fluid level in the left maxillary sinus. A displaced associated fractures not seen, there is a questionable nondisplaced fracture of the medial wall of the left maxillary sinus. Both orbits and globes appear intact without orbital fracture. Near complete opacification of the lower ethmoid sinus and nasal cavity with high-density material. Soft tissue injury to the right nare. Probable blood in the dependent pharynx. Mucosal thickening and small fluid levels in the frontal sinuses without acute fracture. Zygomatic arches, mandibles, nasal bone intact. No fracture through the pterygoid plates. CT CERVICAL SPINE FINDINGS Cervical spine alignment is maintained. Vertebral body heights are preserved. There is no fracture. The dens is intact. There are no jumped or perched facets. Disc space narrowing at C4-C5, C5-C6, and C6-C7 with associated endplate spurs. Scattered facet arthropathy. No prevertebral soft tissue edema. IMPRESSION: 1. No acute intracranial abnormality. Stable but advanced for age atrophy. 2. Blood in the left maxillary sinus with questionable nondisplaced fracture of the medial wall. Blood in the lower ethmoid air cells and nasal cavity without defined nasal bone fracture. Soft tissue injury to the right nare. 3. Degenerative change in the cervical spine without acute fracture or subluxation. Electronically Signed   By: Jeb Levering M.D.   On: 04/05/2015 20:08    Review of Systems  Constitutional: Negative.   HENT:       Current nosebleed  Eyes: Negative.   Respiratory: Negative for cough.   Cardiovascular: Negative for chest pain.  Gastrointestinal: Negative for nausea, vomiting and abdominal pain.  Genitourinary: Negative.   Musculoskeletal: Negative.   Skin: Negative.   Neurological: Negative for loss of consciousness.  Endo/Heme/Allergies: Negative.    Psychiatric/Behavioral: Negative.  Blood pressure 96/65, pulse 81, temperature 96.1 F (35.6 C), temperature source Axillary, resp. rate 13, height _0  (1.803 m), weight 83.915 kg (185 lb), SpO2 94 %. Physical Exam  Constitutional: He is oriented to person, place, and time. He appears well-developed and well-nourished.  HENT:  Head: Head is without abrasion.  Right Ear: Hearing, tympanic membrane, external ear and ear canal normal.  Left Ear: Hearing, tympanic membrane, external ear and ear canal normal.  Nose: Sinus tenderness present. Epistaxis is observed.  Mouth/Throat: Uvula is midline, oropharynx is clear and moist and mucous membranes are normal.  Initial epistaxis has abated, tender face bilaterally anteriorly  Eyes: EOM are normal. Pupils are equal, round, and reactive to light.  Neck: Neck supple. No tracheal deviation present.  Cardiovascular: Normal rate, normal heart sounds and intact distal pulses.   Respiratory: Effort normal and breath sounds normal. No stridor. No respiratory distress. He has no wheezes. He has no rales.  GI: Soft. Bowel sounds are normal. He exhibits no distension. There is no tenderness. There is no rebound and no guarding.  Neurological: He is alert and oriented to person, place, and time. He displays no atrophy and no tremor. He exhibits normal muscle tone. He displays no seizure activity. GCS eye subscore is 4. GCS verbal subscore is 5. GCS motor subscore is 6.  Moves all extremities well  Skin: Skin is warm.  Psychiatric: He has a normal mood and affect.     Assessment/Plan Fall Left maxillary sinus fracture - follow-up with Dr. Redmond Baseman in 5 days Acute blood loss anemia Cirrhosis  Admit to floor, albumin bolus, Ativan PRN withdrawal symptoms, CBC in AM  Montrel Donahoe E, MD 04/05/2015, 11:47 PM

## 2015-04-05 NOTE — ED Notes (Signed)
Placed on 3 liters via nasal cannula, pulse ox saturation dropped to 87.

## 2015-04-05 NOTE — ED Provider Notes (Deleted)
65 year old male tripped over his dog hitting his face. It is uncertain whether there was level consciousness. He was on the floor for about an hour before EMS arrived. He was noted to be hypotensive en route. On exam, he has laceration above his left eye and he has significant nasal swelling with blood in both nares. There is a lot of blood through his scalp but I cannot find any scalp laceration and it seems to be blood that is drained out from his face. This is probably the cause for his hypotension. Neck is nontender. Lungs are clear. Chest is nontender and abdomen is nontender. He has soreness of his right hip but states that he is scheduled to have that replaced and there is fairly normal movement in his hip. He does have some tenderness in the right shoulder and this will be x-rayed. Blood pressure has come up with fluids and he was downgraded from a level I trauma to a level II trauma.  Blood pressure failed to rise with fluids. He was sent for CT of chest, abdomen, pelvis which showed no internal bleeding. It was felt that he probably had significant blood losses cause for his hypotension. Trauma surgery was reconsulted and he will be admitted to their service.  CRITICAL CARE Performed by: Dione Booze Total critical care time: 220 minutes Critical care time was exclusive of separately billable procedures and treating other patients. Critical care was necessary to treat or prevent imminent or life-threatening deterioration. Critical care was time spent personally by me on the following activities: development of treatment plan with patient and/or surrogate as well as nursing, discussions with consultants, evaluation of patient's response to treatment, examination of patient, obtaining history from patient or surrogate, ordering and performing treatments and interventions, ordering and review of laboratory studies, ordering and review of radiographic studies, pulse oximetry and re-evaluation of  patient's condition.  I saw and evaluated the patient, reviewed the resident's note and I agree with the findings and plan.    Dione Booze, MD 04/06/15 [id number]

## 2015-04-05 NOTE — ED Notes (Signed)
Called for blood

## 2015-04-05 NOTE — ED Notes (Signed)
Pt taken to CT by [facility], RN on monitor.

## 2015-04-05 NOTE — ED Notes (Signed)
Pt arrives from home via [facility] reporting mechanical fall in kitchen, reporting lying on floor one hour.  Per EMS no complaints of head, neck or back pain.  Pt reports waiting for hip replacement.  Pt incontinent of stool and urine upon arrival.

## 2015-04-05 NOTE — ED Provider Notes (Signed)
CSN: [id number]     Arrival date & time 04/05/15  1831 History   First MD Initiated Contact with Patient 04/05/15 1831     Chief Complaint  Patient presents with  . Fall     (Consider location/radiation/quality/duration/timing/severity/associated sxs/prior Treatment) Patient is a 65 y.o. male presenting with fall. The history is provided by the EMS personnel and the patient.  Fall This is a new problem. The current episode started today. The problem occurs rarely. The problem has been unchanged. Associated symptoms include headaches. Pertinent negatives include no abdominal pain. Nothing aggravates the symptoms. He has tried nothing for the symptoms. The treatment provided no relief.    Past Medical History  Diagnosis Date  . Hypertension   . Arthritis   . Alcohol abuse   . CHF (congestive heart failure) (HCC)   . Elevated liver enzymes   . Alcoholic cirrhosis of liver without ascites (HCC)   . Thrombocytopenia (HCC)   . Acute hepatitis   . Vitamin D deficiency   . Hypercholesterolemia   . Macrocytic anemia    Past Surgical History  Procedure Laterality Date  . No past surgeries     Family History  Problem Relation Age of Onset  . Heart failure Father   . Colon cancer Neg Hx   . Colon polyps Neg Hx   . Diabetes Neg Hx   . Kidney disease Neg Hx   . Gallbladder disease Neg Hx   . Esophageal cancer Neg Hx   . Alcohol abuse Father   . COPD Mother   . Heart attack Father    Social History  Substance Use Topics  . Smoking status: Never Smoker   . Smokeless tobacco: Never Used  . Alcohol Use: 4.8 oz/week    7 Cans of beer, 1 Shots of liquor per week     Comment: Pt stated does not drink now - 03/16/2014    Review of Systems  Unable to perform ROS: Acuity of condition  Gastrointestinal: Negative for abdominal pain.  Neurological: Positive for headaches.      Allergies  Review of patient's allergies indicates no known allergies.  Home Medications   Prior to  Admission medications   Medication Sig Start Date End Date Taking? Authorizing Provider  acetaminophen (TYLENOL) 500 MG tablet Take 500 mg by mouth every 6 (six) hours as needed.   Yes Historical Provider, MD  allopurinol (ZYLOPRIM) 100 MG tablet Take 1.5 tablets (150 mg total) by mouth daily. 04/26/14  Yes Vassie Loll, MD  folic acid (FOLVITE) 1 MG tablet Take 1 mg by mouth daily.   Yes Historical Provider, MD  furosemide (LASIX) 40 MG tablet Take 40 mg by mouth.   Yes Historical Provider, MD  lisinopril (PRINIVIL,ZESTRIL) 20 MG tablet Take 1 tablet (20 mg total) by mouth daily. 04/26/14  Yes Vassie Loll, MD  metoprolol tartrate (LOPRESSOR) 25 MG tablet Take 0.5 tablets (12.5 mg total) by mouth 2 (two) times daily. 04/26/14  Yes Vassie Loll, MD  pantoprazole (PROTONIX) 40 MG tablet Take 1 tablet (40 mg total) by mouth daily. 04/26/14  Yes Vassie Loll, MD  potassium chloride SA (K-DUR,KLOR-CON) 20 MEQ tablet Take 1 tablet (20 mEq total) by mouth 2 (two) times daily. Take for 4 days then stop. 11/13/14  Yes Lonia Blood, MD  Vitamin D, Ergocalciferol, (DRISDOL) 50000 UNITS CAPS capsule Take 50,000 Units by mouth every 7 (seven) days.    Yes Historical Provider, MD  feeding supplement, ENSURE COMPLETE, (ENSURE COMPLETE) LIQD  Take 237 mLs by mouth 2 (two) times daily between meals. Patient taking differently: Take 237 mLs by mouth 2 (two) times daily between meals. Pt uses Med Pass 90 03/04/14   Rodolph Bong, MD  lactulose (CHRONULAC) 10 GM/15ML solution Take 45 mLs (30 g total) by mouth 3 (three) times daily. 11/13/14   Lonia Blood, MD  magnesium oxide (MAG-OX) 400 (241.3 MG) MG tablet Take 1 tablet (400 mg total) by mouth 2 (two) times daily. 11/13/14   Lonia Blood, MD  Multiple Vitamin (MULTIVITAMIN WITH MINERALS) TABS tablet Take 1 tablet by mouth daily. 03/04/14   Rodolph Bong, MD  Omega-3 Fatty Acids (FISH OIL PO) Take 1 capsule by mouth daily.    Historical Provider, MD   thiamine 100 MG tablet Take 1 tablet (100 mg total) by mouth daily. Patient not taking: Reported on 04/05/2015 11/13/14   Lonia Blood, MD   BP 95/63 mmHg  Pulse 81  Temp(Src) 96.1 F (35.6 C) (Axillary)  Resp 12  Ht 5\' 11"  (1.803 m)  Wt 83.915 kg  BMI 25.81 kg/m2  SpO2 97% Physical Exam  Constitutional: He is oriented to person, place, and time. Vital signs are normal. He appears ill. Nasal cannula in place.  HENT:  Mouth/Throat: No oropharyngeal exudate.  Patient's face is swollen with bleeding through bilateral nares but no obvious instabilities to the head or face.  Eyes: Pupils are equal, round, and reactive to light. No scleral icterus.  Neck:  No midline C-spine tenderness  Cardiovascular: Normal rate, regular rhythm, normal heart sounds and intact distal pulses.   Pulmonary/Chest: Effort normal and breath sounds normal. No respiratory distress.  Mild tenderness palpation along the left shoulder  Abdominal: Soft. He exhibits no distension. There is no tenderness. There is no rebound and no guarding.  Musculoskeletal: Normal range of motion. He exhibits no edema or tenderness.  Neurological: He is alert and oriented to person, place, and time. No cranial nerve deficit. He exhibits normal muscle tone. Coordination normal.  Skin: Skin is warm and dry. No rash noted. No erythema. No pallor.    ED Course  Procedures (including critical care time) Labs Review Labs Reviewed  CBC - Abnormal; Notable for the following:    RBC 2.83 (*)    Hemoglobin 10.2 (*)    HCT 29.5 (*)    MCV 104.2 (*)    MCH 36.0 (*)    Platelets 43 (*)    All other components within normal limits  COMPREHENSIVE METABOLIC PANEL - Abnormal; Notable for the following:    Potassium 2.9 (*)    CO2 18 (*)    Glucose, Bld 102 (*)    Calcium 6.3 (*)    Total Protein 4.5 (*)    Albumin 2.4 (*)    AST 121 (*)    Alkaline Phosphatase 127 (*)    Total Bilirubin 1.8 (*)    All other components within  normal limits  PROTIME-INR - Abnormal; Notable for the following:    Prothrombin Time 18.9 (*)    INR 1.58 (*)    All other components within normal limits  CBC - Abnormal; Notable for the following:    RBC 2.96 (*)    Hemoglobin 10.9 (*)    HCT 30.6 (*)    MCV 103.4 (*)    MCH 36.8 (*)    Platelets 34 (*)    All other components within normal limits  TYPE AND SCREEN  PREPARE FRESH FROZEN PLASMA  ABO/RH    Imaging Review Ct Head Wo Contrast  04/05/2015  CLINICAL DATA:  Fall in kitchen floor after tripping over dog. Incontinent of stool and urine. Multiple lacerations and hematomas about the head and face. EXAM: CT HEAD WITHOUT CONTRAST CT MAXILLOFACIAL WITHOUT CONTRAST CT CERVICAL SPINE WITHOUT CONTRAST TECHNIQUE: Multidetector CT imaging of the head, cervical spine, and maxillofacial structures were performed using the standard protocol without intravenous contrast. Multiplanar CT image reconstructions of the cervical spine and maxillofacial structures were also generated. COMPARISON:  Head CT 11/10/2014 FINDINGS: CT HEAD FINDINGS No intracranial hemorrhage, mass effect, or midline shift. Atrophy, stable from prior but advanced for age. No hydrocephalus. The basilar cisterns are patent. No evidence of territorial infarct. No intracranial fluid collection. Calvarium is intact. The mastoid air cells are well aerated. CT MAXILLOFACIAL FINDINGS There is blood with fluid level in the left maxillary sinus. A displaced associated fractures not seen, there is a questionable nondisplaced fracture of the medial wall of the left maxillary sinus. Both orbits and globes appear intact without orbital fracture. Near complete opacification of the lower ethmoid sinus and nasal cavity with high-density material. Soft tissue injury to the right nare. Probable blood in the dependent pharynx. Mucosal thickening and small fluid levels in the frontal sinuses without acute fracture. Zygomatic arches, mandibles, nasal  bone intact. No fracture through the pterygoid plates. CT CERVICAL SPINE FINDINGS Cervical spine alignment is maintained. Vertebral body heights are preserved. There is no fracture. The dens is intact. There are no jumped or perched facets. Disc space narrowing at C4-C5, C5-C6, and C6-C7 with associated endplate spurs. Scattered facet arthropathy. No prevertebral soft tissue edema. IMPRESSION: 1. No acute intracranial abnormality. Stable but advanced for age atrophy. 2. Blood in the left maxillary sinus with questionable nondisplaced fracture of the medial wall. Blood in the lower ethmoid air cells and nasal cavity without defined nasal bone fracture. Soft tissue injury to the right nare. 3. Degenerative change in the cervical spine without acute fracture or subluxation. Electronically Signed   By: Rubye Oaks M.D.   On: 04/05/2015 20:08   Ct Chest W Contrast  04/05/2015  CLINICAL DATA:  Hypotension. EXAM: CT CHEST, ABDOMEN, AND PELVIS WITH CONTRAST TECHNIQUE: Multidetector CT imaging of the chest, abdomen and pelvis was performed following the standard protocol during bolus administration of intravenous contrast. CONTRAST:  OMNIPAQUE IOHEXOL 300 MG/ML  SOLN COMPARISON:  04/25/2014 FINDINGS: CT CHEST Mediastinum: Normal heart size. No pericardial effusion. Aortic atherosclerosis noted. Calcification within the RCA and LAD coronary artery noted. The trachea is patent and is midline. Normal appearance of the esophagus. No mediastinal or hilar adenopathy. There is no axillary or supraclavicular adenopathy. Lungs/Pleura: No pleural fluid. No airspace consolidation. No pulmonary edema. The lungs are clear. Musculoskeletal: Chronic healed right posterior rib fractures are identified. No acute bone abnormalities noted. CT ABDOMEN AND PELVIS Hepatobiliary: There is hypertrophy of the lateral segment and caudate lobes of liver. Diffuse low attenuation throughout the liver is identified. Recanalization of the  umbilical vein is noted. Within the left lobe of liver there is a low attenuation structure measuring 2 cm. Previously 1.6 cm. A second focus of low density is identified within the right lobe of liver measuring 2.6 cm. Not seen on previous exam. Gallbladder is negative. No biliary dilatation. Pancreas: Normal appearance of the pancreas. Spleen: The spleen is normal. Adrenals/Urinary Tract: Normal adrenal glands. Unremarkable appearance of the kidneys. The urinary bladder appears normal. Stomach/Bowel: The stomach appears normal. The scratch  set there is a variant of a malrotation deformity. Although the third portion of the duodenum crosses the midline the jejunum is largely situated in the right abdomen. No dilated loops of small bowel identified to suggest volvulus. The cecum is in knee normal anatomic location within the right lower quadrant of the abdomen. The appendix is visualized and appears normal. No pathologic dilatation of the large bowel loops. Numerous colonic diverticula noted without acute inflammation. Vascular/Lymphatic: Calcified atherosclerotic disease involves the abdominal aorta. No aneurysm. No enlarged upper abdominal lymph nodes identified. No pelvic or inguinal adenopathy. Reproductive: The prostate gland and seminal vesicles are unremarkable. Other: There is mild ascites within the upper abdomen. 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 This is most advanced at the L5-S1 level. There is asymmetric left-sided L5 pars defect. IMPRESSION: 1. No acute cardiopulmonary abnormalities identified. 2. Morphologic features of the liver compatible with cirrhosis. There is evidence of portal venous hypertension with ascites and re- cannulization of the umbilical vein. 3. Malrotation variant. The proximal small bowel loops including the jejunum are largely situated within the right abdomen. No evidence for volvulus. 4. Several indeterminate low-attenuation  structures are identified in the liver. In this patient who is at increased risk for liver malignancy further evaluation with nonemergent MRI of the liver with contrast material is recommended. 5. Aortic atherosclerosis 6. Lumbar spondylosis. Electronically Signed   By: Signa Kell M.D.   On: 04/05/2015 22:47   Ct Cervical Spine Wo Contrast  04/05/2015  CLINICAL DATA:  Fall in kitchen floor after tripping over dog. Incontinent of stool and urine. Multiple lacerations and hematomas about the head and face. EXAM: CT HEAD WITHOUT CONTRAST CT MAXILLOFACIAL WITHOUT CONTRAST CT CERVICAL SPINE WITHOUT CONTRAST TECHNIQUE: Multidetector CT imaging of the head, cervical spine, and maxillofacial structures were performed using the standard protocol without intravenous contrast. Multiplanar CT image reconstructions of the cervical spine and maxillofacial structures were also generated. COMPARISON:  Head CT 11/10/2014 FINDINGS: CT HEAD FINDINGS No intracranial hemorrhage, mass effect, or midline shift. Atrophy, stable from prior but advanced for age. No hydrocephalus. The basilar cisterns are patent. No evidence of territorial infarct. No intracranial fluid collection. Calvarium is intact. The mastoid air cells are well aerated. CT MAXILLOFACIAL FINDINGS There is blood with fluid level in the left maxillary sinus. A displaced associated fractures not seen, there is a questionable nondisplaced fracture of the medial wall of the left maxillary sinus. Both orbits and globes appear intact without orbital fracture. Near complete opacification of the lower ethmoid sinus and nasal cavity with high-density material. Soft tissue injury to the right nare. Probable blood in the dependent pharynx. Mucosal thickening and small fluid levels in the frontal sinuses without acute fracture. Zygomatic arches, mandibles, nasal bone intact. No fracture through the pterygoid plates. CT CERVICAL SPINE FINDINGS Cervical spine alignment is  maintained. Vertebral body heights are preserved. There is no fracture. The dens is intact. There are no jumped or perched facets. Disc space narrowing at C4-C5, C5-C6, and C6-C7 with associated endplate spurs. Scattered facet arthropathy. No prevertebral soft tissue edema. IMPRESSION: 1. No acute intracranial abnormality. Stable but advanced for age atrophy. 2. Blood in the left maxillary sinus with questionable nondisplaced fracture of the medial wall. Blood in the lower ethmoid air cells and nasal cavity without defined nasal bone fracture. Soft tissue injury to the right nare. 3. Degenerative change in the cervical spine without acute fracture or subluxation. Electronically Signed   By: Rubye Oaks  M.D.   On: 04/05/2015 20:08   Ct Abdomen Pelvis W Contrast  04/05/2015  CLINICAL DATA:  Hypotension. EXAM: CT CHEST, ABDOMEN, AND PELVIS WITH CONTRAST TECHNIQUE: Multidetector CT imaging of the chest, abdomen and pelvis was performed following the standard protocol during bolus administration of intravenous contrast. CONTRAST:  OMNIPAQUE IOHEXOL 300 MG/ML  SOLN COMPARISON:  04/25/2014 FINDINGS: CT CHEST Mediastinum: Normal heart size. No pericardial effusion. Aortic atherosclerosis noted. Calcification within the RCA and LAD coronary artery noted. The trachea is patent and is midline. Normal appearance of the esophagus. No mediastinal or hilar adenopathy. There is no axillary or supraclavicular adenopathy. Lungs/Pleura: No pleural fluid. No airspace consolidation. No pulmonary edema. The lungs are clear. Musculoskeletal: Chronic healed right posterior rib fractures are identified. No acute bone abnormalities noted. CT ABDOMEN AND PELVIS Hepatobiliary: There is hypertrophy of the lateral segment and caudate lobes of liver. Diffuse low attenuation throughout the liver is identified. Recanalization of the umbilical vein is noted. Within the left lobe of liver there is a low attenuation structure measuring 2  cm. Previously 1.6 cm. A second focus of low density is identified within the right lobe of liver measuring 2.6 cm. Not seen on previous exam. Gallbladder is negative. No biliary dilatation. Pancreas: Normal appearance of the pancreas. Spleen: The spleen is normal. Adrenals/Urinary Tract: Normal adrenal glands. Unremarkable appearance of the kidneys. The urinary bladder appears normal. Stomach/Bowel: The stomach appears normal. The scratch set there is a variant of a malrotation deformity. Although the third portion of the duodenum crosses the midline the jejunum is largely situated in the right abdomen. No dilated loops of small bowel identified to suggest volvulus. The cecum is in knee normal anatomic location within the right lower quadrant of the abdomen. The appendix is visualized and appears normal. No pathologic dilatation of the large bowel loops. Numerous colonic diverticula noted without acute inflammation. Vascular/Lymphatic: Calcified atherosclerotic disease involves the abdominal aorta. No aneurysm. No enlarged upper abdominal lymph nodes identified. No pelvic or inguinal adenopathy. Reproductive: The prostate gland and seminal vesicles are unremarkable. Other: There is mild ascites within the upper abdomen. 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 This is most advanced at the L5-S1 level. There is asymmetric left-sided L5 pars defect. IMPRESSION: 1. No acute cardiopulmonary abnormalities identified. 2. Morphologic features of the liver compatible with cirrhosis. There is evidence of portal venous hypertension with ascites and re- cannulization of the umbilical vein. 3. Malrotation variant. The proximal small bowel loops including the jejunum are largely situated within the right abdomen. No evidence for volvulus. 4. Several indeterminate low-attenuation structures are identified in the liver. In this patient who is at increased risk for liver malignancy further  evaluation with nonemergent MRI of the liver with contrast material is recommended. 5. Aortic atherosclerosis 6. Lumbar spondylosis. Electronically Signed   By: Signa Kell M.D.   On: 04/05/2015 22:47   Dg Pelvis Portable  04/05/2015  CLINICAL DATA:  Mechanical fall in his kitchen, landing on his face. EXAM: PORTABLE PELVIS 1-2 VIEWS COMPARISON:  None. FINDINGS: There is no evidence of pelvic fracture or dislocation. There are degenerative joint changes of bilateral hips with narrowed joint space. There is increased sclerosis mixed with lucency with flattening deformity of the superior right femoral head raising the question of avascular necrosis. IMPRESSION: No acute fracture or dislocation. Electronically Signed   By: Sherian Rein M.D.   On: 04/05/2015 19:23   Dg Chest Portable 1 View  04/05/2015  CLINICAL DATA:  The patient fell in his kitchen this evening with a blow to the face. Anterior left shoulder pain and bruising. Initial encounter. EXAM: PORTABLE CHEST 1 VIEW COMPARISON:  Single view of the chest and CT chest 04/25/2014. FINDINGS: Remote right clavicle fracture is again seen. The patient also has multiple remote right rib fractures. There is cardiomegaly without edema. The lungs are clear. No pneumothorax or pleural effusion. IMPRESSION: Cardiomegaly without acute disease. Remote right clavicle and rib fractures. Electronically Signed   By: Drusilla Kanner M.D.   On: 04/05/2015 19:23   Dg Shoulder Left Port  04/05/2015  CLINICAL DATA:  Trauma level 2. Pt reports mechanical fall in his kitchen, landing on his face. Pt states he laid in the floor for an hour. Pt c/o anterior left shoulder bruising. Denies chest complaints. Pt also states he is aware of needing a right hip replacement. EXAM: LEFT SHOULDER - 1 VIEW COMPARISON:  Chest radiograph, 04/25/2014 FINDINGS: No acute fracture or dislocation. AC joint is widened with the clavicle lying superior to the acromion. This is consistent with an  old AC joint separation. It is similar to the prior chest radiograph. Soft tissues are unremarkable. IMPRESSION: No fracture or acute finding. Electronically Signed   By: Amie Portland M.D.   On: 04/05/2015 19:24   Ct Maxillofacial Wo Cm  04/05/2015  CLINICAL DATA:  Fall in kitchen floor after tripping over dog. Incontinent of stool and urine. Multiple lacerations and hematomas about the head and face. EXAM: CT HEAD WITHOUT CONTRAST CT MAXILLOFACIAL WITHOUT CONTRAST CT CERVICAL SPINE WITHOUT CONTRAST TECHNIQUE: Multidetector CT imaging of the head, cervical spine, and maxillofacial structures were performed using the standard protocol without intravenous contrast. Multiplanar CT image reconstructions of the cervical spine and maxillofacial structures were also generated. COMPARISON:  Head CT 11/10/2014 FINDINGS: CT HEAD FINDINGS No intracranial hemorrhage, mass effect, or midline shift. Atrophy, stable from prior but advanced for age. No hydrocephalus. The basilar cisterns are patent. No evidence of territorial infarct. No intracranial fluid collection. Calvarium is intact. The mastoid air cells are well aerated. CT MAXILLOFACIAL FINDINGS There is blood with fluid level in the left maxillary sinus. A displaced associated fractures not seen, there is a questionable nondisplaced fracture of the medial wall of the left maxillary sinus. Both orbits and globes appear intact without orbital fracture. Near complete opacification of the lower ethmoid sinus and nasal cavity with high-density material. Soft tissue injury to the right nare. Probable blood in the dependent pharynx. Mucosal thickening and small fluid levels in the frontal sinuses without acute fracture. Zygomatic arches, mandibles, nasal bone intact. No fracture through the pterygoid plates. CT CERVICAL SPINE FINDINGS Cervical spine alignment is maintained. Vertebral body heights are preserved. There is no fracture. The dens is intact. There are no jumped or  perched facets. Disc space narrowing at C4-C5, C5-C6, and C6-C7 with associated endplate spurs. Scattered facet arthropathy. No prevertebral soft tissue edema. IMPRESSION: 1. No acute intracranial abnormality. Stable but advanced for age atrophy. 2. Blood in the left maxillary sinus with questionable nondisplaced fracture of the medial wall. Blood in the lower ethmoid air cells and nasal cavity without defined nasal bone fracture. Soft tissue injury to the right nare. 3. Degenerative change in the cervical spine without acute fracture or subluxation. Electronically Signed   By: Rubye Oaks M.D.   On: 04/05/2015 20:08   I have personally reviewed and evaluated these images and lab results as part of my medical decision-making.  EKG Interpretation None      MDM   Final diagnoses:  Maxillary sinus fracture, closed, initial encounter [facility])  Fall, initial encounter  Trauma  Other specified hypotension    Patient is a 66 year old male who presents via EMS as a leveled trauma after being found down at home. It is reported that he had a mechanical fall in the kitchen landing on his face on hardwood floor. Found in a large amount of blood after a prolonged time down, reported greater than an hour. He was also incontinent of stool and urine. Upon arrival patient is hypotensive with systolics into the 70s with a large amount of facial trauma and blood coming out of bilateral nares. Otherwise he has a GCS of 15. Patient had a negative FAST exam. Patient initially had a head CT and C-spine CT significant for facial fractures but otherwise no acute traumatic findings. Upon return from the CT patient remains hypotensive despite 2 L of IV fluids. Another liter of IV fluid ordered and CT chest abdomen pelvis obtained without acute traumatic injuries. Patient's blood pressure improved with a third IV fluid bolus. Given patient's hypotension he will be admitted to trauma for further management and observation.  Spoke with ENT about his facial injuries who recommended follow-up in his office in 5 days after discharge.    Marijean Niemann, MD 04/06/15 [id number]  Dione Booze, MD 04/06/15 [phone]

## 2015-04-05 NOTE — ED Notes (Signed)
MD made aware about oxygen, no further orders received.

## 2015-04-06 ENCOUNTER — Encounter (HOSPITAL_COMMUNITY): Payer: Self-pay | Admitting: General Practice

## 2015-04-06 DIAGNOSIS — D696 Thrombocytopenia, unspecified: Secondary | ICD-10-CM | POA: Diagnosis present

## 2015-04-06 DIAGNOSIS — I10 Essential (primary) hypertension: Secondary | ICD-10-CM | POA: Diagnosis present

## 2015-04-06 DIAGNOSIS — S01112A Laceration without foreign body of left eyelid and periocular area, initial encounter: Secondary | ICD-10-CM | POA: Diagnosis present

## 2015-04-06 DIAGNOSIS — K7030 Alcoholic cirrhosis of liver without ascites: Secondary | ICD-10-CM | POA: Diagnosis present

## 2015-04-06 DIAGNOSIS — D539 Nutritional anemia, unspecified: Secondary | ICD-10-CM | POA: Diagnosis present

## 2015-04-06 DIAGNOSIS — I9589 Other hypotension: Secondary | ICD-10-CM | POA: Diagnosis present

## 2015-04-06 DIAGNOSIS — Z811 Family history of alcohol abuse and dependence: Secondary | ICD-10-CM | POA: Diagnosis not present

## 2015-04-06 DIAGNOSIS — S0240DA Maxillary fracture, left side, initial encounter for closed fracture: Secondary | ICD-10-CM | POA: Diagnosis present

## 2015-04-06 DIAGNOSIS — R040 Epistaxis: Secondary | ICD-10-CM | POA: Diagnosis present

## 2015-04-06 DIAGNOSIS — W010XXA Fall on same level from slipping, tripping and stumbling without subsequent striking against object, initial encounter: Secondary | ICD-10-CM | POA: Diagnosis present

## 2015-04-06 DIAGNOSIS — E7800 Pure hypercholesterolemia, unspecified: Secondary | ICD-10-CM | POA: Diagnosis present

## 2015-04-06 DIAGNOSIS — I509 Heart failure, unspecified: Secondary | ICD-10-CM | POA: Diagnosis present

## 2015-04-06 DIAGNOSIS — D62 Acute posthemorrhagic anemia: Secondary | ICD-10-CM | POA: Diagnosis present

## 2015-04-06 DIAGNOSIS — R402412 Glasgow coma scale score 13-15, at arrival to emergency department: Secondary | ICD-10-CM | POA: Diagnosis present

## 2015-04-06 DIAGNOSIS — E559 Vitamin D deficiency, unspecified: Secondary | ICD-10-CM | POA: Diagnosis present

## 2015-04-06 DIAGNOSIS — F10231 Alcohol dependence with withdrawal delirium: Secondary | ICD-10-CM | POA: Diagnosis present

## 2015-04-06 DIAGNOSIS — M1990 Unspecified osteoarthritis, unspecified site: Secondary | ICD-10-CM | POA: Diagnosis present

## 2015-04-06 LAB — CBC
HCT: 28.8 % — ABNORMAL LOW (ref 39.0–52.0)
HEMATOCRIT: 28.1 % — AB (ref 39.0–52.0)
HEMOGLOBIN: 9.7 g/dL — AB (ref 13.0–17.0)
Hemoglobin: 10.1 g/dL — ABNORMAL LOW (ref 13.0–17.0)
MCH: 36.5 pg — AB (ref 26.0–34.0)
MCH: 36.9 pg — AB (ref 26.0–34.0)
MCHC: 34.5 g/dL (ref 30.0–36.0)
MCHC: 35.1 g/dL (ref 30.0–36.0)
MCV: 105.6 fL — ABNORMAL HIGH (ref 78.0–100.0)
MCV: 105.1 fL — AB (ref 78.0–100.0)
PLATELETS: 79 10*3/uL — AB (ref 150–400)
Platelets: 31 10*3/uL — ABNORMAL LOW (ref 150–400)
RBC: 2.66 MIL/uL — ABNORMAL LOW (ref 4.22–5.81)
RBC: 2.74 MIL/uL — ABNORMAL LOW (ref 4.22–5.81)
RDW: 14.8 % (ref 11.5–15.5)
RDW: 14.5 % (ref 11.5–15.5)
WBC: 6.1 10*3/uL (ref 4.0–10.5)
WBC: 7.3 10*3/uL (ref 4.0–10.5)

## 2015-04-06 LAB — BLOOD PRODUCT ORDER (VERBAL) VERIFICATION

## 2015-04-06 MED ORDER — OXYMETAZOLINE HCL 0.05 % NA SOLN
1.0000 | Freq: Two times a day (BID) | NASAL | Status: DC
  Administered 2015-04-06 – 2015-04-08 (×5): 1 via NASAL
  Filled 2015-04-06: qty 15

## 2015-04-06 MED ORDER — POTASSIUM CHLORIDE CRYS ER 20 MEQ PO TBCR
20.0000 meq | EXTENDED_RELEASE_TABLET | Freq: Two times a day (BID) | ORAL | Status: DC
  Administered 2015-04-06 – 2015-04-08 (×6): 20 meq via ORAL
  Filled 2015-04-06 (×6): qty 1

## 2015-04-06 MED ORDER — SODIUM CHLORIDE 0.9 % IV SOLN
Freq: Once | INTRAVENOUS | Status: AC
  Administered 2015-04-06: 11:00:00 via INTRAVENOUS

## 2015-04-06 MED ORDER — MAGNESIUM OXIDE 400 (241.3 MG) MG PO TABS
400.0000 mg | ORAL_TABLET | Freq: Two times a day (BID) | ORAL | Status: DC
  Administered 2015-04-06 – 2015-04-08 (×6): 400 mg via ORAL
  Filled 2015-04-06 (×6): qty 1

## 2015-04-06 MED ORDER — FOLIC ACID 1 MG PO TABS
1.0000 mg | ORAL_TABLET | Freq: Every day | ORAL | Status: DC
  Administered 2015-04-06 – 2015-04-08 (×3): 1 mg via ORAL
  Filled 2015-04-06 (×3): qty 1

## 2015-04-06 MED ORDER — OXYCODONE HCL 5 MG PO TABS
10.0000 mg | ORAL_TABLET | ORAL | Status: DC | PRN
  Administered 2015-04-06: 10 mg via ORAL
  Filled 2015-04-06: qty 2

## 2015-04-06 MED ORDER — KCL IN DEXTROSE-NACL 20-5-0.45 MEQ/L-%-% IV SOLN
INTRAVENOUS | Status: DC
  Administered 2015-04-06 (×2): via INTRAVENOUS
  Filled 2015-04-06 (×3): qty 1000

## 2015-04-06 MED ORDER — PANTOPRAZOLE SODIUM 40 MG PO TBEC
40.0000 mg | DELAYED_RELEASE_TABLET | Freq: Every day | ORAL | Status: DC
  Administered 2015-04-06 – 2015-04-08 (×3): 40 mg via ORAL
  Filled 2015-04-06 (×3): qty 1

## 2015-04-06 MED ORDER — ALBUMIN HUMAN 5 % IV SOLN
12.5000 g | Freq: Once | INTRAVENOUS | Status: AC
  Administered 2015-04-06: 12.5 g via INTRAVENOUS
  Filled 2015-04-06: qty 250

## 2015-04-06 MED ORDER — ACETAMINOPHEN 325 MG PO TABS: 650.0000 mg | ORAL_TABLET | ORAL | Status: DC | PRN

## 2015-04-06 MED ORDER — LACTULOSE 10 GM/15ML PO SOLN
30.0000 g | Freq: Three times a day (TID) | ORAL | Status: DC
  Administered 2015-04-06 – 2015-04-07 (×4): 30 g via ORAL
  Filled 2015-04-06 (×5): qty 45

## 2015-04-06 MED ORDER — LORAZEPAM 2 MG/ML IJ SOLN: 1.0000 mg | INTRAMUSCULAR | Status: DC | PRN

## 2015-04-06 MED ORDER — SPIRITUS FRUMENTI
2.0000 | Freq: Three times a day (TID) | ORAL | Status: DC
Start: 2015-04-06 — End: 2015-04-08
  Administered 2015-04-06 – 2015-04-08 (×7): 2 via ORAL
  Administered 2015-04-08: 1 via ORAL
  Filled 2015-04-06 (×9): qty 2

## 2015-04-06 MED ORDER — ONDANSETRON HCL 4 MG PO TABS: 4.0000 mg | ORAL_TABLET | Freq: Four times a day (QID) | ORAL | Status: DC | PRN

## 2015-04-06 MED ORDER — LORAZEPAM 1 MG PO TABS
1.0000 mg | ORAL_TABLET | Freq: Four times a day (QID) | ORAL | Status: DC | PRN
  Administered 2015-04-06: 1 mg via ORAL
  Filled 2015-04-06: qty 1

## 2015-04-06 MED ORDER — MORPHINE SULFATE (PF) 2 MG/ML IV SOLN: 2.0000 mg | INTRAVENOUS | Status: DC | PRN

## 2015-04-06 MED ORDER — ALLOPURINOL 300 MG PO TABS
150.0000 mg | ORAL_TABLET | Freq: Every day | ORAL | Status: DC
  Administered 2015-04-06 – 2015-04-08 (×3): 150 mg via ORAL
  Filled 2015-04-06 (×3): qty 1

## 2015-04-06 MED ORDER — OXYCODONE HCL 5 MG PO TABS: 5.0000 mg | ORAL_TABLET | ORAL | Status: DC | PRN

## 2015-04-06 MED ORDER — ONDANSETRON HCL 4 MG/2ML IJ SOLN
4.0000 mg | Freq: Four times a day (QID) | INTRAMUSCULAR | Status: DC | PRN
  Administered 2015-04-06 – 2015-04-07 (×2): 4 mg via INTRAVENOUS
  Filled 2015-04-06 (×3): qty 2

## 2015-04-06 NOTE — Progress Notes (Signed)
BP now 123/74, PR 96, RR 19 and O2Sat=96% on RA after administration of Albumin Human 5% Soln 12.5 g 250 mL.  Administered PRN Oxy IR 10 mg for pain after patient had a BM in the bathroom.  Patient now resting comfortably on bed with both eyes closed.

## 2015-04-06 NOTE — Care Management Note (Signed)
Case Management Note  Patient Details  Name: Gary [name] MRN: [id number] Date of Birth: [birth year]  Subjective/Objective:   Pt admitted on 04/05/15 s/p fall with maxillary sinus fracture.  PTA, pt independent, lives at home with spouse.                 Action/Plan: Met with pt to discuss dc planning.  Pt states his wife is currently in [place] visiting their new grandchild.  He states his nextdoor neighbor can assist him if needed.  Recommend PT consult, as pt states he has a bad hip, and walks with cane.  Will follow for recommendations.    Expected Discharge Date:                  Expected Discharge Plan:  [place]  In-House Referral:     Discharge planning Services  CM Consult  Post Acute Care Choice:    Choice offered to:     DME Arranged:    DME Agency:     HH Arranged:    [place] Agency:     Status of Service:  In process, will continue to follow  Medicare Important Message Given:    Date Medicare IM Given:    Medicare IM give by:    Date Additional Medicare IM Given:    Additional Medicare Important Message give by:     If discussed at [place] of Stay Meetings, dates discussed:    Additional Comments:  Reinaldo Raddle, RN, BSN  Trauma/Neuro ICU Case Manager [phone]

## 2015-04-06 NOTE — Progress Notes (Signed)
[facility] Progress Note     Subjective: Patient in bed in NAD. Denies epistaxis overnight or this morning. Does complain of trouble breathing due to dried blood in nares. Normotensive today. Denies palpitations, CP, weakness, dizziness. Ambulating to bathroom with assistance. Walks with cane at home due to previous injury of right hip.  Pt states he wants to go home and take care of his dog. Says his neighbor may be able to give him a ride home if he is discharged today.  Objective: Vital signs in last 24 hours: Temp:  [96.1 F (35.6 C)-98.1 F (36.7 C)] 98.1 F (36.7 C) (02/03 0425) Pulse Rate:  [67-122] 96 (02/03 0425) Resp:  [9-29] 19 (02/03 0425) BP: (73-123)/(38-76) 123/74 mmHg (02/03 0425) SpO2:  [84 %-100 %] 96 % (02/03 0425) Weight:  [83.915 kg (185 lb)] 83.915 kg (185 lb) (02/02 1837) Last BM Date: 04/05/15  Intake/Output from previous day: 02/02 0701 - 02/03 0700 In: 3985 [P.O.:360; I.V.:3375; IV Piggyback:250] Out: -  Intake/Output this shift:    PE: HEENT: head obviously traumatic with BL hematomas under eyes, PEERL, nares with dired blood BL, right nare almost completely occluded Gen:  Alert, NAD, pleasant, hand tremors BL  Card:  RRR, no M/G/R heard Pulm:  CTA, no W/R/R Abd: firm, NT, reducible umbilical hernia, +BS  Lab Results:   Recent Labs  04/05/15 1840 04/05/15 2121  WBC 4.6 6.9  HGB 10.2* 10.9*  HCT 29.5* 30.6*  PLT 43* 34*   BMET  Recent Labs  04/05/15 1840  NA 140  K 2.9*  CL 108  CO2 18*  GLUCOSE 102*  BUN 6  CREATININE 0.98  CALCIUM 6.3*   PT/INR  Recent Labs  04/05/15 1840  LABPROT 18.9*  INR 1.58*   CMP     Component Value Date/Time   NA 140 04/05/2015 1840   K 2.9* 04/05/2015 1840   CL 108 04/05/2015 1840   CO2 18* 04/05/2015 1840   GLUCOSE 102* 04/05/2015 1840   BUN 6 04/05/2015 1840   CREATININE 0.98 04/05/2015 1840   CALCIUM 6.3* 04/05/2015 1840   PROT 4.5* 04/05/2015 1840   ALBUMIN 2.4*  04/05/2015 1840   AST 121* 04/05/2015 1840   ALT 35 04/05/2015 1840   ALKPHOS 127* 04/05/2015 1840   BILITOT 1.8* 04/05/2015 1840   GFRNONAA >60 04/05/2015 1840   GFRAA >60 04/05/2015 1840   Lipase     Component Value Date/Time   LIPASE 52* 11/10/2014 1722   Studies/Results: Ct Head Wo Contrast  04/05/2015  CLINICAL DATA:  Fall in kitchen floor after tripping over dog. Incontinent of stool and urine. Multiple lacerations and hematomas about the head and face. EXAM: CT HEAD WITHOUT CONTRAST CT MAXILLOFACIAL WITHOUT CONTRAST CT CERVICAL SPINE WITHOUT CONTRAST TECHNIQUE: Multidetector CT imaging of the head, cervical spine, and maxillofacial structures were performed using the standard protocol without intravenous contrast. Multiplanar CT image reconstructions of the cervical spine and maxillofacial structures were also generated. COMPARISON:  Head CT 11/10/2014 FINDINGS: CT HEAD FINDINGS No intracranial hemorrhage, mass effect, or midline shift. Atrophy, stable from prior but advanced for age. No hydrocephalus. The basilar cisterns are patent. No evidence of territorial infarct. No intracranial fluid collection. Calvarium is intact. The mastoid air cells are well aerated. CT MAXILLOFACIAL FINDINGS There is blood with fluid level in the left maxillary sinus. A displaced associated fractures not seen, there is a questionable nondisplaced fracture of the medial wall of the left maxillary sinus. Both orbits and globes  appear intact without orbital fracture. Near complete opacification of the lower ethmoid sinus and nasal cavity with high-density material. Soft tissue injury to the right nare. Probable blood in the dependent pharynx. Mucosal thickening and small fluid levels in the frontal sinuses without acute fracture. Zygomatic arches, mandibles, nasal bone intact. No fracture through the pterygoid plates. CT CERVICAL SPINE FINDINGS Cervical spine alignment is maintained. Vertebral body heights are  preserved. There is no fracture. The dens is intact. There are no jumped or perched facets. Disc space narrowing at C4-C5, C5-C6, and C6-C7 with associated endplate spurs. Scattered facet arthropathy. No prevertebral soft tissue edema. IMPRESSION: 1. No acute intracranial abnormality. Stable but advanced for age atrophy. 2. Blood in the left maxillary sinus with questionable nondisplaced fracture of the medial wall. Blood in the lower ethmoid air cells and nasal cavity without defined nasal bone fracture. Soft tissue injury to the right nare. 3. Degenerative change in the cervical spine without acute fracture or subluxation. Electronically Signed   By: Rubye Oaks M.D.   On: 04/05/2015 20:08   Ct Chest W Contrast  04/05/2015  CLINICAL DATA:  Hypotension. EXAM: CT CHEST, ABDOMEN, AND PELVIS WITH CONTRAST TECHNIQUE: Multidetector CT imaging of the chest, abdomen and pelvis was performed following the standard protocol during bolus administration of intravenous contrast. CONTRAST:  OMNIPAQUE IOHEXOL 300 MG/ML  SOLN COMPARISON:  04/25/2014 FINDINGS: CT CHEST Mediastinum: Normal heart size. No pericardial effusion. Aortic atherosclerosis noted. Calcification within the RCA and LAD coronary artery noted. The trachea is patent and is midline. Normal appearance of the esophagus. No mediastinal or hilar adenopathy. There is no axillary or supraclavicular adenopathy. Lungs/Pleura: No pleural fluid. No airspace consolidation. No pulmonary edema. The lungs are clear. Musculoskeletal: Chronic healed right posterior rib fractures are identified. No acute bone abnormalities noted. CT ABDOMEN AND PELVIS Hepatobiliary: There is hypertrophy of the lateral segment and caudate lobes of liver. Diffuse low attenuation throughout the liver is identified. Recanalization of the umbilical vein is noted. Within the left lobe of liver there is a low attenuation structure measuring 2 cm. Previously 1.6 cm. A second focus of low  density is identified within the right lobe of liver measuring 2.6 cm. Not seen on previous exam. Gallbladder is negative. No biliary dilatation. Pancreas: Normal appearance of the pancreas. Spleen: The spleen is normal. Adrenals/Urinary Tract: Normal adrenal glands. Unremarkable appearance of the kidneys. The urinary bladder appears normal. Stomach/Bowel: The stomach appears normal. The scratch set there is a variant of a malrotation deformity. Although the third portion of the duodenum crosses the midline the jejunum is largely situated in the right abdomen. No dilated loops of small bowel identified to suggest volvulus. The cecum is in knee normal anatomic location within the right lower quadrant of the abdomen. The appendix is visualized and appears normal. No pathologic dilatation of the large bowel loops. Numerous colonic diverticula noted without acute inflammation. Vascular/Lymphatic: Calcified atherosclerotic disease involves the abdominal aorta. No aneurysm. No enlarged upper abdominal lymph nodes identified. No pelvic or inguinal adenopathy. Reproductive: The prostate gland and seminal vesicles are unremarkable. Other: There is mild ascites within the upper abdomen. 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 This is most advanced at the L5-S1 level. There is asymmetric left-sided L5 pars defect. IMPRESSION: 1. No acute cardiopulmonary abnormalities identified. 2. Morphologic features of the liver compatible with cirrhosis. There is evidence of portal venous hypertension with ascites and re- cannulization of the umbilical vein. 3. Malrotation variant. The  proximal small bowel loops including the jejunum are largely situated within the right abdomen. No evidence for volvulus. 4. Several indeterminate low-attenuation structures are identified in the liver. In this patient who is at increased risk for liver malignancy further evaluation with nonemergent MRI of the liver  with contrast material is recommended. 5. Aortic atherosclerosis 6. Lumbar spondylosis. Electronically Signed   By: Signa Kell M.D.   On: 04/05/2015 22:47   Ct Cervical Spine Wo Contrast  04/05/2015  CLINICAL DATA:  Fall in kitchen floor after tripping over dog. Incontinent of stool and urine. Multiple lacerations and hematomas about the head and face. EXAM: CT HEAD WITHOUT CONTRAST CT MAXILLOFACIAL WITHOUT CONTRAST CT CERVICAL SPINE WITHOUT CONTRAST TECHNIQUE: Multidetector CT imaging of the head, cervical spine, and maxillofacial structures were performed using the standard protocol without intravenous contrast. Multiplanar CT image reconstructions of the cervical spine and maxillofacial structures were also generated. COMPARISON:  Head CT 11/10/2014 FINDINGS: CT HEAD FINDINGS No intracranial hemorrhage, mass effect, or midline shift. Atrophy, stable from prior but advanced for age. No hydrocephalus. The basilar cisterns are patent. No evidence of territorial infarct. No intracranial fluid collection. Calvarium is intact. The mastoid air cells are well aerated. CT MAXILLOFACIAL FINDINGS There is blood with fluid level in the left maxillary sinus. A displaced associated fractures not seen, there is a questionable nondisplaced fracture of the medial wall of the left maxillary sinus. Both orbits and globes appear intact without orbital fracture. Near complete opacification of the lower ethmoid sinus and nasal cavity with high-density material. Soft tissue injury to the right nare. Probable blood in the dependent pharynx. Mucosal thickening and small fluid levels in the frontal sinuses without acute fracture. Zygomatic arches, mandibles, nasal bone intact. No fracture through the pterygoid plates. CT CERVICAL SPINE FINDINGS Cervical spine alignment is maintained. Vertebral body heights are preserved. There is no fracture. The dens is intact. There are no jumped or perched facets. Disc space narrowing at C4-C5,  C5-C6, and C6-C7 with associated endplate spurs. Scattered facet arthropathy. No prevertebral soft tissue edema. IMPRESSION: 1. No acute intracranial abnormality. Stable but advanced for age atrophy. 2. Blood in the left maxillary sinus with questionable nondisplaced fracture of the medial wall. Blood in the lower ethmoid air cells and nasal cavity without defined nasal bone fracture. Soft tissue injury to the right nare. 3. Degenerative change in the cervical spine without acute fracture or subluxation. Electronically Signed   By: Rubye Oaks M.D.   On: 04/05/2015 20:08   Ct Abdomen Pelvis W Contrast  04/05/2015  CLINICAL DATA:  Hypotension. EXAM: CT CHEST, ABDOMEN, AND PELVIS WITH CONTRAST TECHNIQUE: Multidetector CT imaging of the chest, abdomen and pelvis was performed following the standard protocol during bolus administration of intravenous contrast. CONTRAST:  OMNIPAQUE IOHEXOL 300 MG/ML  SOLN COMPARISON:  04/25/2014 FINDINGS: CT CHEST Mediastinum: Normal heart size. No pericardial effusion. Aortic atherosclerosis noted. Calcification within the RCA and LAD coronary artery noted. The trachea is patent and is midline. Normal appearance of the esophagus. No mediastinal or hilar adenopathy. There is no axillary or supraclavicular adenopathy. Lungs/Pleura: No pleural fluid. No airspace consolidation. No pulmonary edema. The lungs are clear. Musculoskeletal: Chronic healed right posterior rib fractures are identified. No acute bone abnormalities noted. CT ABDOMEN AND PELVIS Hepatobiliary: There is hypertrophy of the lateral segment and caudate lobes of liver. Diffuse low attenuation throughout the liver is identified. Recanalization of the umbilical vein is noted. Within the left lobe of liver there is a  low attenuation structure measuring 2 cm. Previously 1.6 cm. A second focus of low density is identified within the right lobe of liver measuring 2.6 cm. Not seen on previous exam. Gallbladder is  negative. No biliary dilatation. Pancreas: Normal appearance of the pancreas. Spleen: The spleen is normal. Adrenals/Urinary Tract: Normal adrenal glands. Unremarkable appearance of the kidneys. The urinary bladder appears normal. Stomach/Bowel: The stomach appears normal. The scratch set there is a variant of a malrotation deformity. Although the third portion of the duodenum crosses the midline the jejunum is largely situated in the right abdomen. No dilated loops of small bowel identified to suggest volvulus. The cecum is in knee normal anatomic location within the right lower quadrant of the abdomen. The appendix is visualized and appears normal. No pathologic dilatation of the large bowel loops. Numerous colonic diverticula noted without acute inflammation. Vascular/Lymphatic: Calcified atherosclerotic disease involves the abdominal aorta. No aneurysm. No enlarged upper abdominal lymph nodes identified. No pelvic or inguinal adenopathy. Reproductive: The prostate gland and seminal vesicles are unremarkable. Other: There is mild ascites within the upper abdomen. 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 This is most advanced at the L5-S1 level. There is asymmetric left-sided L5 pars defect. IMPRESSION: 1. No acute cardiopulmonary abnormalities identified. 2. Morphologic features of the liver compatible with cirrhosis. There is evidence of portal venous hypertension with ascites and re- cannulization of the umbilical vein. 3. Malrotation variant. The proximal small bowel loops including the jejunum are largely situated within the right abdomen. No evidence for volvulus. 4. Several indeterminate low-attenuation structures are identified in the liver. In this patient who is at increased risk for liver malignancy further evaluation with nonemergent MRI of the liver with contrast material is recommended. 5. Aortic atherosclerosis 6. Lumbar spondylosis. Electronically Signed   By:  Signa Kell M.D.   On: 04/05/2015 22:47   Dg Pelvis Portable  04/05/2015  CLINICAL DATA:  Mechanical fall in his kitchen, landing on his face. EXAM: PORTABLE PELVIS 1-2 VIEWS COMPARISON:  None. FINDINGS: There is no evidence of pelvic fracture or dislocation. There are degenerative joint changes of bilateral hips with narrowed joint space. There is increased sclerosis mixed with lucency with flattening deformity of the superior right femoral head raising the question of avascular necrosis. IMPRESSION: No acute fracture or dislocation. Electronically Signed   By: Sherian Rein M.D.   On: 04/05/2015 19:23   Dg Chest Portable 1 View  04/05/2015  CLINICAL DATA:  The patient fell in his kitchen this evening with a blow to the face. Anterior left shoulder pain and bruising. Initial encounter. EXAM: PORTABLE CHEST 1 VIEW COMPARISON:  Single view of the chest and CT chest 04/25/2014. FINDINGS: Remote right clavicle fracture is again seen. The patient also has multiple remote right rib fractures. There is cardiomegaly without edema. The lungs are clear. No pneumothorax or pleural effusion. IMPRESSION: Cardiomegaly without acute disease. Remote right clavicle and rib fractures. Electronically Signed   By: Drusilla Kanner M.D.   On: 04/05/2015 19:23   Dg Shoulder Left Port  04/05/2015  CLINICAL DATA:  Trauma level 2. Pt reports mechanical fall in his kitchen, landing on his face. Pt states he laid in the floor for an hour. Pt c/o anterior left shoulder bruising. Denies chest complaints. Pt also states he is aware of needing a right hip replacement. EXAM: LEFT SHOULDER - 1 VIEW COMPARISON:  Chest radiograph, 04/25/2014 FINDINGS: No acute fracture or dislocation. AC joint is widened with the  clavicle lying superior to the acromion. This is consistent with an old AC joint separation. It is similar to the prior chest radiograph. Soft tissues are unremarkable. IMPRESSION: No fracture or acute finding. Electronically  Signed   By: Amie Portland M.D.   On: 04/05/2015 19:24   Ct Maxillofacial Wo Cm  04/05/2015  CLINICAL DATA:  Fall in kitchen floor after tripping over dog. Incontinent of stool and urine. Multiple lacerations and hematomas about the head and face. EXAM: CT HEAD WITHOUT CONTRAST CT MAXILLOFACIAL WITHOUT CONTRAST CT CERVICAL SPINE WITHOUT CONTRAST TECHNIQUE: Multidetector CT imaging of the head, cervical spine, and maxillofacial structures were performed using the standard protocol without intravenous contrast. Multiplanar CT image reconstructions of the cervical spine and maxillofacial structures were also generated. COMPARISON:  Head CT 11/10/2014 FINDINGS: CT HEAD FINDINGS No intracranial hemorrhage, mass effect, or midline shift. Atrophy, stable from prior but advanced for age. No hydrocephalus. The basilar cisterns are patent. No evidence of territorial infarct. No intracranial fluid collection. Calvarium is intact. The mastoid air cells are well aerated. CT MAXILLOFACIAL FINDINGS There is blood with fluid level in the left maxillary sinus. A displaced associated fractures not seen, there is a questionable nondisplaced fracture of the medial wall of the left maxillary sinus. Both orbits and globes appear intact without orbital fracture. Near complete opacification of the lower ethmoid sinus and nasal cavity with high-density material. Soft tissue injury to the right nare. Probable blood in the dependent pharynx. Mucosal thickening and small fluid levels in the frontal sinuses without acute fracture. Zygomatic arches, mandibles, nasal bone intact. No fracture through the pterygoid plates. CT CERVICAL SPINE FINDINGS Cervical spine alignment is maintained. Vertebral body heights are preserved. There is no fracture. The dens is intact. There are no jumped or perched facets. Disc space narrowing at C4-C5, C5-C6, and C6-C7 with associated endplate spurs. Scattered facet arthropathy. No prevertebral soft tissue edema.  IMPRESSION: 1. No acute intracranial abnormality. Stable but advanced for age atrophy. 2. Blood in the left maxillary sinus with questionable nondisplaced fracture of the medial wall. Blood in the lower ethmoid air cells and nasal cavity without defined nasal bone fracture. Soft tissue injury to the right nare. 3. Degenerative change in the cervical spine without acute fracture or subluxation. Electronically Signed   By: Rubye Oaks M.D.   On: 04/05/2015 20:08   Assessment/Plan Fall Acute blood loss anemia - VSS ( BP 123/74, Pulse 96, O2 96% ORA) after 12.5g albumun; CBC pending  Afrin nasal spray  Left maxillary sinus fracture - FU with Dr. Jenne Pane in 5 days. Cirrhosis    Hosie Spangle PA Student  04/06/2015, 8:17 AM Pager: [phone]

## 2015-04-06 NOTE — Progress Notes (Signed)
Received patient from ED.  Patient AOx4, noted dried blood on hair, face, chest, bilateral arms and nose.  CNA wiped dried blood with warm wash cloth. VS stable but noted low BP at 95/64, T98.1, PR 83, RR 16 and O2Sat at 98% on 2L/min via [place]. Noted dime-sized laceration on left lateral forearm, cleansed it, povidine applied and cover with gauze. Will closely monitor.

## 2015-04-07 LAB — PREPARE PLATELET PHERESIS
UNIT DIVISION: 0
Unit division: 0

## 2015-04-07 MED ORDER — OXYMETAZOLINE HCL 0.05 % NA SOLN: 1.0000 | Freq: Two times a day (BID) | NASAL | Status: DC

## 2015-04-07 MED ORDER — OXYCODONE HCL 5 MG PO TABS: 5.0000 mg | ORAL_TABLET | ORAL | Status: DC | PRN

## 2015-04-07 NOTE — Care Management Note (Signed)
Case Management Note  Patient Details  Name: [name] MRN: [id number] Date of Birth: 1950-12-09  Subjective/Objective:                  fall with maxillary sinus fracture Action/Plan: Discharge planning Expected Discharge Date:  04/07/15               Expected Discharge Plan:  Home/Self Care  In-House Referral:     Discharge planning Services  CM Consult, Medication Assistance  Post Acute Care Choice:    Choice offered to:     DME Arranged:    DME Agency:     HH Arranged:    HH Agency:     Status of Service:  Completed, signed off  Medicare Important Message Given:    Date Medicare IM Given:    Medicare IM give by:    Date Additional Medicare IM Given:    Additional Medicare Important Message give by:     If discussed at Long Length of Stay Meetings, dates discussed:    Additional Comments: Pt has [employer] and, unfortunately, does not meet criteria for charity med asst (MATCH).  CM reviewed discharge medication plan and no new meds prescribed have any discounts available for CM to apply.  No other CM needs were communicated. Yves Dill, RN 04/07/2015, 10:26 AM

## 2015-04-07 NOTE — Evaluation (Signed)
Physical Therapy Evaluation Patient Details Name: Gary [name] MRN: [id number] DOB: 12/27/1950 Today's Date: 04/07/2015   History of Present Illness  65 y.o. male admitted after fall with maxillary sinus fracture. Hx of HTN, alcohol abuse, CHF, arthritis, alcoholic cirrhosis of liver, and thrombocytopenia.  Clinical Impression  Pt admitted with above diagnosis. Pt currently with functional limitations due to the deficits listed below (see PT Problem List). Demonstrates balance impairment however reports he is at baseline. Agrees he should use RW at home due to instability rather than his cane. Of note, patient reports wife is in [place] and will have a neighbor assist him as needed - this was also the case last year during an admission which was reported to PT; question reliability? May benefit from HHPT evaluation for safety in home environment. Will monitor and progress during this admission.    Follow Up Recommendations Home health PT;Supervision - Intermittent    Equipment Recommendations  None recommended by PT    Recommendations for Other Services OT consult     Precautions / Restrictions Precautions Precautions: Fall Restrictions Weight Bearing Restrictions: No      Mobility  Bed Mobility               General bed mobility comments: in recliner  Transfers Overall transfer level: Modified independent Equipment used: Rolling walker (2 wheeled)             General transfer comment: good stability with RW for support  Ambulation/Gait Ambulation/Gait assistance: Supervision Ambulation Distance (Feet): 175 Feet Assistive device: Rolling walker (2 wheeled);None Gait Pattern/deviations: Step-through pattern;Decreased stride length;Trunk flexed;Wide base of support;Antalgic Gait velocity: decreased Gait velocity interpretation: Below normal speed for age/gender General Gait Details: Antalgic gait pattern (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 Educated on safe  DME use with a rolling walker. Trialed short distance without RW demonstrating shuffling and more instability. Greatly improved with RW for support. No overt loss of balance noted. VC for upright posture. Able to ambulate backwards safely.  Stairs            Wheelchair Mobility    Modified Rankin (Stroke Patients Only)       Balance Overall balance assessment: Needs assistance;History of Falls Sitting-balance support: No upper extremity supported;Feet supported Sitting balance-Leahy Scale: Good     Standing balance support: No upper extremity supported Standing balance-Leahy Scale: Fair                               Pertinent Vitals/Pain Pain Assessment: No/denies pain    Home Living Family/patient expects to be discharged to:: Private residence Living Arrangements: Spouse/significant other Available Help at Discharge: Neighbor;Available PRN/intermittently (WIFE OUT OF TOWN) Type of Home: House Home Access: Stairs to enter Entrance Stairs-Rails: None Entrance Stairs-Number of Steps: 1 Home Layout: One level Home Equipment: Cane - single point;Walker - 2 wheels      Prior Function Level of Independence: Independent with assistive device(s)         Comments: cane to ambulate     Hand Dominance   Dominant Hand: Right    Extremity/Trunk Assessment   Upper Extremity Assessment: Defer to OT evaluation           Lower Extremity Assessment: Generalized weakness         Communication   Communication: No difficulties  Cognition Arousal/Alertness: Awake/alert Behavior During Therapy: WFL for tasks assessed/performed Overall Cognitive Status: Impaired/Different from baseline Area of Impairment:  Orientation;Problem solving Orientation Level: Disoriented to;Time (cues for year)           Problem Solving: Slow processing      General Comments General comments (skin integrity, edema, etc.): Adamant that fall occurred from tripping over  dog. Agrees that RW is more appropriate for balance rather than cane due to Rt hip dysfunction. States neighbor can help pt as much as needed.    Exercises General Exercises - Lower Extremity Gluteal Sets: Strengthening;Both;10 reps;Seated Long Arc Quad: Strengthening;Right;10 reps;Seated Hip ABduction/ADduction: Strengthening;10 reps;Both;Seated Hip Flexion/Marching: Strengthening;Right;10 reps;Seated      Assessment/Plan    PT Assessment Patient needs continued PT services  PT Diagnosis Abnormality of gait;Difficulty walking;Generalized weakness   PT Problem List Decreased strength;Decreased activity tolerance;Decreased balance;Decreased mobility;Decreased cognition;Decreased knowledge of use of DME  PT Treatment Interventions DME instruction;Gait training;Functional mobility training;Therapeutic activities;Therapeutic exercise;Balance training;Neuromuscular re-education;Patient/family education   PT Goals (Current goals can be found in the Care Plan section) Acute Rehab PT Goals Patient Stated Goal: Get a hip replacement PT Goal Formulation: With patient Time For Goal Achievement: 04/21/15 Potential to Achieve Goals: Fair    Frequency Min 3X/week   Barriers to discharge Decreased caregiver support lives with wife who is in [place] at the moment (of note - wife was in [place] on previous admission)    Co-evaluation               End of Session Equipment Utilized During Treatment: Gait belt Activity Tolerance: Patient tolerated treatment well Patient left: in chair;with call bell/phone within reach Nurse Communication: Mobility status         Time: 1201-1220 PT Time Calculation (min) (ACUTE ONLY): 19 min   Charges:   PT Evaluation $PT Eval Low Complexity: 1 Procedure     PT G CodesBerton Mount 04/07/2015, 12:54 PM Sunday [place], [place] [phone]

## 2015-04-08 NOTE — Progress Notes (Addendum)
Discharge instructions gone over with patient and neighbor present. Prescription given. Follow up appointment to be made. Home medications gone over. Diet, activity, and reasons to call the doctor gone over. [facility] with [facility]. Patient has his own walker at home. Patient is encouraged not to drink alcohol.  Patient verbalized understanding of instructions.

## 2015-04-08 NOTE — Care Management Note (Signed)
Case Management Note  Patient Details  Name: Gary [name] MRN: [id number] Date of Birth: 1951-02-04  Subjective/Objective:                  Fall  Action/Plan: CM spoke with patient at the bedside. Patient lives at home with his wife. He has a RW. [facility] selected for HHPT. Tiffany at [facility] notified of the referral and discharge date of today.   Expected Discharge Date:   04/08/15               Expected Discharge Plan:  Home w Home Health Services  In-House Referral:     Discharge planning Services  CM Consult  Post Acute Care Choice:  Home Health Choice offered to:  Patient  DME Arranged:    DME Agency:  NA  HH Arranged:  PT HH Agency:  [facility]  Status of Service:  Completed, signed off  Medicare Important Message Given:    Date Medicare IM Given:    Medicare IM give by:    Date Additional Medicare IM Given:    Additional Medicare Important Message give by:     If discussed at Long Length of Stay Meetings, dates discussed:    Additional Comments:  Antony Haste, RN 04/08/2015, 12:46 PM

## 2015-04-19 NOTE — Discharge Summary (Signed)
Physician Discharge Summary  Patient ID: KEIFER HABIB MRN: [id number] DOB/AGE: [birth year] 65 y.o.  Admit date: 04/05/2015 Discharge date: 04/08/2015  Discharge Diagnoses Patient Active Problem List   Diagnosis Date Noted  . Maxillary sinus fracture (HCC) 04/05/2015  . Elevated LFTs 11/21/2014  . Hepatitis 11/21/2014  . Alcohol abuse 11/21/2014  . Hypokalemia   . Hypomagnesemia   . Increased liver enzymes   . Polyarthritis of multiple sites (HCC)   . Esophageal reflux   . Alcoholic cirrhosis of liver without ascites (HCC)   . Shortness of breath 04/25/2014  . History of alcohol abuse 04/25/2014  . Lower extremity edema 04/25/2014  . Ascites 04/25/2014  . Rash 04/25/2014  . Left ankle pain 04/25/2014  . Hyponatremia   . Hyperbilirubinemia 03/01/2014  . Anemia 03/01/2014  . Thrombocytopenia (HCC) 03/01/2014  . Alcohol dependence with withdrawal with complication (HCC) 02/28/2014  . Alcoholic hepatitis without ascites 02/25/2014  . Acute hepatitis 02/25/2014    Consultants None   Procedures None   HPI: Gary [name] was standing in his kitchen when he tripped over his dog. He fell down hitting his face. He had a significant blood loss in his kitchen before he was transported to the emergency department. He was made a level one trauma on arrival due to hypotension. He was downgraded shortly thereafter. He had an acute blood loss anemia and some intermittent hypotension which responded to fluid boluses. Full evaluation revealed only the maxillary sinus fracture on the left side. He was admitted to the trauma service. ENT was contacted and suggested outpatient follow-up in 5 days.   Hospital Course: The patient quickly began to experience DT's. He was given beer which seemed to help. He was evaluated with physical therapy and did surprisingly well. He did not have any more episodes of hypotension. His pain was controlled with oral medications. His anemia stabilized. He was discharged home  in good condition.     Medication List    TAKE these medications        acetaminophen 500 MG tablet  Commonly known as:  TYLENOL  Take 500 mg by mouth every 6 (six) hours as needed.     allopurinol 100 MG tablet  Commonly known as:  ZYLOPRIM  Take 1.5 tablets (150 mg total) by mouth daily.     feeding supplement (ENSURE COMPLETE) Liqd  Take 237 mLs by mouth 2 (two) times daily between meals.     FISH OIL PO  Take 1 capsule by mouth daily.     folic acid 1 MG tablet  Commonly known as:  FOLVITE  Take 1 mg by mouth daily.     furosemide 40 MG tablet  Commonly known as:  LASIX  Take 40 mg by mouth.     lactulose 10 GM/15ML solution  Commonly known as:  CHRONULAC  Take 45 mLs (30 g total) by mouth 3 (three) times daily.     lisinopril 20 MG tablet  Commonly known as:  PRINIVIL,ZESTRIL  Take 1 tablet (20 mg total) by mouth daily.     magnesium oxide 400 (241.3 Mg) MG tablet  Commonly known as:  MAG-OX  Take 1 tablet (400 mg total) by mouth 2 (two) times daily.     metoprolol tartrate 25 MG tablet  Commonly known as:  LOPRESSOR  Take 0.5 tablets (12.5 mg total) by mouth 2 (two) times daily.     multivitamin with minerals Tabs tablet  Take 1 tablet by mouth daily.  oxyCODONE 5 MG immediate release tablet  Commonly known as:  Oxy IR/ROXICODONE  Take 1 tablet (5 mg total) by mouth every 4 (four) hours as needed for moderate pain.     oxymetazoline 0.05 % nasal spray  Commonly known as:  AFRIN  Place 1 spray into both nostrils 2 (two) times daily.     pantoprazole 40 MG tablet  Commonly known as:  PROTONIX  Take 1 tablet (40 mg total) by mouth daily.     potassium chloride SA 20 MEQ tablet  Commonly known as:  K-DUR,KLOR-CON  Take 1 tablet (20 mEq total) by mouth 2 (two) times daily. Take for 4 days then stop.     thiamine 100 MG tablet  Take 1 tablet (100 mg total) by mouth daily.     Vitamin D (Ergocalciferol) 50000 units Caps capsule  Commonly known  as:  DRISDOL  Take 50,000 Units by mouth every 7 (seven) days.             Follow-up Information    Follow up with BATES, DWIGHT, MD In 1 week.   Specialty:  Otolaryngology   Contact information:   [address] [place] [address] [phone]       Call [facility] TRAUMA [facility].   Why:  As needed   Contact information:   [address] [id number] mc [place] [address] [phone]      Follow up with [facility].   Why:  home health physical therapy   Contact information:   [address] [place] [address] [phone]        Signed: Freeman Caldron, PA-C Pager: [phone] General Trauma PA Pager: [phone] 04/19/2015, 3:23 PM

## 2015-06-14 ENCOUNTER — Encounter (HOSPITAL_COMMUNITY): Payer: Self-pay | Admitting: *Deleted

## 2015-06-14 ENCOUNTER — Inpatient Hospital Stay (HOSPITAL_COMMUNITY)
Admission: EM | Admit: 2015-06-14 | Discharge: 2015-06-20 | DRG: 433 | Disposition: A | Discharge status: Skilled Nursing Facility | Payer: Medicare Other | Attending: Internal Medicine | Admitting: Internal Medicine

## 2015-06-14 DIAGNOSIS — K7010 Alcoholic hepatitis without ascites: Secondary | ICD-10-CM | POA: Insufficient documentation

## 2015-06-14 DIAGNOSIS — R945 Abnormal results of liver function studies: Secondary | ICD-10-CM

## 2015-06-14 DIAGNOSIS — S20229A Contusion of unspecified back wall of thorax, initial encounter: Secondary | ICD-10-CM | POA: Diagnosis present

## 2015-06-14 DIAGNOSIS — E785 Hyperlipidemia, unspecified: Secondary | ICD-10-CM | POA: Diagnosis present

## 2015-06-14 DIAGNOSIS — I110 Hypertensive heart disease with heart failure: Secondary | ICD-10-CM | POA: Diagnosis present

## 2015-06-14 DIAGNOSIS — W010XXA Fall on same level from slipping, tripping and stumbling without subsequent striking against object, initial encounter: Secondary | ICD-10-CM | POA: Diagnosis present

## 2015-06-14 DIAGNOSIS — R931 Abnormal findings on diagnostic imaging of heart and coronary circulation: Secondary | ICD-10-CM | POA: Insufficient documentation

## 2015-06-14 DIAGNOSIS — R7989 Other specified abnormal findings of blood chemistry: Secondary | ICD-10-CM

## 2015-06-14 DIAGNOSIS — R4182 Altered mental status, unspecified: Secondary | ICD-10-CM

## 2015-06-14 DIAGNOSIS — M6282 Rhabdomyolysis: Secondary | ICD-10-CM | POA: Diagnosis present

## 2015-06-14 DIAGNOSIS — S20219A Contusion of unspecified front wall of thorax, initial encounter: Secondary | ICD-10-CM | POA: Diagnosis present

## 2015-06-14 DIAGNOSIS — E876 Hypokalemia: Secondary | ICD-10-CM | POA: Diagnosis present

## 2015-06-14 DIAGNOSIS — E871 Hypo-osmolality and hyponatremia: Secondary | ICD-10-CM | POA: Diagnosis present

## 2015-06-14 DIAGNOSIS — Z8249 Family history of ischemic heart disease and other diseases of the circulatory system: Secondary | ICD-10-CM

## 2015-06-14 DIAGNOSIS — K7011 Alcoholic hepatitis with ascites: Secondary | ICD-10-CM | POA: Diagnosis not present

## 2015-06-14 DIAGNOSIS — I471 Supraventricular tachycardia: Secondary | ICD-10-CM | POA: Diagnosis present

## 2015-06-14 DIAGNOSIS — N390 Urinary tract infection, site not specified: Secondary | ICD-10-CM | POA: Diagnosis present

## 2015-06-14 DIAGNOSIS — I519 Heart disease, unspecified: Secondary | ICD-10-CM

## 2015-06-14 DIAGNOSIS — K7460 Unspecified cirrhosis of liver: Secondary | ICD-10-CM | POA: Diagnosis present

## 2015-06-14 DIAGNOSIS — D6959 Other secondary thrombocytopenia: Secondary | ICD-10-CM | POA: Diagnosis present

## 2015-06-14 DIAGNOSIS — R748 Abnormal levels of other serum enzymes: Secondary | ICD-10-CM | POA: Diagnosis present

## 2015-06-14 DIAGNOSIS — S301XXA Contusion of abdominal wall, initial encounter: Secondary | ICD-10-CM | POA: Diagnosis present

## 2015-06-14 DIAGNOSIS — K7290 Hepatic failure, unspecified without coma: Secondary | ICD-10-CM | POA: Diagnosis present

## 2015-06-14 DIAGNOSIS — R778 Other specified abnormalities of plasma proteins: Secondary | ICD-10-CM | POA: Diagnosis present

## 2015-06-14 DIAGNOSIS — W19XXXA Unspecified fall, initial encounter: Secondary | ICD-10-CM

## 2015-06-14 DIAGNOSIS — D696 Thrombocytopenia, unspecified: Secondary | ICD-10-CM | POA: Diagnosis present

## 2015-06-14 DIAGNOSIS — Z79899 Other long term (current) drug therapy: Secondary | ICD-10-CM

## 2015-06-14 DIAGNOSIS — G9340 Encephalopathy, unspecified: Secondary | ICD-10-CM

## 2015-06-14 LAB — COMPREHENSIVE METABOLIC PANEL
ALBUMIN: 2.8 g/dL — AB (ref 3.5–5.0)
ALT: 102 U/L — ABNORMAL HIGH (ref 17–63)
AST: 284 U/L — AB (ref 15–41)
Alkaline Phosphatase: 148 U/L — ABNORMAL HIGH (ref 38–126)
Anion gap: 17 — ABNORMAL HIGH (ref 5–15)
BUN: 18 mg/dL (ref 6–20)
CHLORIDE: 92 mmol/L — AB (ref 101–111)
CO2: 21 mmol/L — ABNORMAL LOW (ref 22–32)
Calcium: 8.7 mg/dL — ABNORMAL LOW (ref 8.9–10.3)
Creatinine, Ser: 1.1 mg/dL (ref 0.61–1.24)
GFR calc Af Amer: >60 mL/min (ref >60)
Glucose, Bld: 119 mg/dL — ABNORMAL HIGH (ref 65–99)
POTASSIUM: 2.7 mmol/L — AB (ref 3.5–5.1)
SODIUM: 130 mmol/L — AB (ref 135–145)
Total Bilirubin: 13.6 mg/dL — ABNORMAL HIGH (ref 0.3–1.2)
Total Protein: 6.1 g/dL — ABNORMAL LOW (ref 6.5–8.1)

## 2015-06-14 LAB — CBC WITH DIFFERENTIAL/PLATELET
BASOS ABS: 0 10*3/uL (ref 0.0–0.1)
BASOS PCT: 0 %
Eosinophils Absolute: 0 10*3/uL (ref 0.0–0.7)
Eosinophils Relative: 0 %
HEMATOCRIT: 36.5 % — AB (ref 39.0–52.0)
Hemoglobin: 13.2 g/dL (ref 13.0–17.0)
Lymphocytes Relative: 6 %
Lymphs Abs: 0.5 10*3/uL — ABNORMAL LOW (ref 0.7–4.0)
MCH: 36.1 pg — ABNORMAL HIGH (ref 26.0–34.0)
MCHC: 36.2 g/dL — ABNORMAL HIGH (ref 30.0–36.0)
MCV: 99.7 fL (ref 78.0–100.0)
MONO ABS: 1.2 10*3/uL — AB (ref 0.1–1.0)
Monocytes Relative: 15 %
NEUTROS ABS: 6.3 10*3/uL (ref 1.7–7.7)
NEUTROS PCT: 79 %
Platelets: 71 10*3/uL — ABNORMAL LOW (ref 150–400)
RBC: 3.66 MIL/uL — ABNORMAL LOW (ref 4.22–5.81)
RDW: 15 % (ref 11.5–15.5)
WBC: 8 10*3/uL (ref 4.0–10.5)

## 2015-06-14 LAB — I-STAT TROPONIN, ED: TROPONIN I, POC: 0.12 ng/mL — AB (ref 0.00–0.08)

## 2015-06-14 LAB — CK: CK TOTAL: 2183 U/L — AB (ref 49–397)

## 2015-06-14 MED ORDER — SODIUM CHLORIDE 0.9 % IV BOLUS (SEPSIS)
1000.0000 mL | Freq: Once | INTRAVENOUS | Status: AC
  Administered 2015-06-14: 1000 mL via INTRAVENOUS

## 2015-06-14 MED ORDER — POTASSIUM CHLORIDE 10 MEQ/100ML IV SOLN
10.0000 meq | INTRAVENOUS | Status: AC
  Administered 2015-06-14 – 2015-06-15 (×4): 10 meq via INTRAVENOUS
  Filled 2015-06-14: qty 100

## 2015-06-14 NOTE — ED Notes (Signed)
Dr. Mora Bellman updated

## 2015-06-14 NOTE — ED Notes (Signed)
Here by [facility] from home, found prone on living room floor, wife in [place], sister called neighbor to check on pt, unknown time frame or downtime, states he fell a month ago, pt awake, interactive, confused, oriented to person and place, follows commands, generally weak, no dyspnea noted, resps shallow, old and new bruises noted to body, incontinent of urine, arrives on LSB with c-collar and NSL. cbg 106.

## 2015-06-14 NOTE — ED Provider Notes (Signed)
CSN: [id number]     Arrival date & time 06/14/15  2219 History  By signing my name below, I, Arianna Nassar, attest that this documentation has been prepared under the direction and in the presence of Tomasita Crumble, MD. Electronically Signed: Octavia Heir, ED Scribe. 06/14/2015. 11:23 PM.    Chief Complaint  Patient presents with  . Altered Mental Status    LEVEL 5 CAVEAT DUE TO ALTERED MENTAL STATUS  The history is provided by the EMS personnel. The history is limited by the condition of the patient. No language interpreter was used.   HPI Comments: Gary [name] is a 65 y.o. male with a hx of HTN, brought in by ambulance, who presents to the Emergency Department complaining of altered mental status onset this evening. Per EMS, pt was found in his home on the floor. According to them, his wife is out of town and neighbor was called to check on the patient. It is unknown how long pt was down for. Pt presents with a very strong urine smell. He is confused and oriented to person and place. There are bruises noted to the body.  Past Medical History  Diagnosis Date  . Hypertension   . Arthritis   . Alcohol abuse   . CHF (congestive heart failure) (HCC)   . Elevated liver enzymes   . Alcoholic cirrhosis of liver without ascites (HCC)   . Thrombocytopenia (HCC)   . Acute hepatitis   . Vitamin D deficiency   . Hypercholesterolemia   . Macrocytic anemia   . Tremors of nervous system    Past Surgical History  Procedure Laterality Date  . Tonsillectomy    . Wisdom tooth extraction     Family History  Problem Relation Age of Onset  . Heart failure Father   . Colon cancer Neg Hx   . Colon polyps Neg Hx   . Diabetes Neg Hx   . Kidney disease Neg Hx   . Gallbladder disease Neg Hx   . Esophageal cancer Neg Hx   . Alcohol abuse Father   . COPD Mother   . Heart attack Father    Social History  Substance Use Topics  . Smoking status: Never Smoker   . Smokeless tobacco: Never Used  .  Alcohol Use: 4.8 oz/week    7 Cans of beer, 1 Shots of liquor per week     Comment: Pt stated does not drink now - 03/16/2014         04/05/2015  " i  still drink 2 beers every date  "    Review of Systems  A complete 10 system review of systems was obtained and all systems are negative except as noted in the HPI and PMH.    Allergies  Review of patient's allergies indicates no known allergies.  Home Medications   Prior to Admission medications   Medication Sig Start Date End Date Taking? Authorizing Provider  acetaminophen (TYLENOL) 500 MG tablet Take 500 mg by mouth every 6 (six) hours as needed.    Historical Provider, MD  allopurinol (ZYLOPRIM) 100 MG tablet Take 1.5 tablets (150 mg total) by mouth daily. 04/26/14   Vassie Loll, MD  feeding supplement, ENSURE COMPLETE, (ENSURE COMPLETE) LIQD Take 237 mLs by mouth 2 (two) times daily between meals. Patient taking differently: Take 237 mLs by mouth 2 (two) times daily between meals. Pt uses [facility] 90 03/04/14   Rodolph Bong, MD  folic acid (FOLVITE) 1 MG tablet  Take 1 mg by mouth daily.    Historical Provider, MD  furosemide (LASIX) 40 MG tablet Take 40 mg by mouth.    Historical Provider, MD  lactulose (CHRONULAC) 10 GM/15ML solution Take 45 mLs (30 g total) by mouth 3 (three) times daily. 11/13/14   Lonia Blood, MD  lisinopril (PRINIVIL,ZESTRIL) 20 MG tablet Take 1 tablet (20 mg total) by mouth daily. 04/26/14   Vassie Loll, MD  magnesium oxide (MAG-OX) 400 (241.3 MG) MG tablet Take 1 tablet (400 mg total) by mouth 2 (two) times daily. 11/13/14   Lonia Blood, MD  metoprolol tartrate (LOPRESSOR) 25 MG tablet Take 0.5 tablets (12.5 mg total) by mouth 2 (two) times daily. 04/26/14   Vassie Loll, MD  Multiple Vitamin (MULTIVITAMIN WITH MINERALS) TABS tablet Take 1 tablet by mouth daily. 03/04/14   Rodolph Bong, MD  Omega-3 Fatty Acids (FISH OIL PO) Take 1 capsule by mouth daily.    Historical Provider, MD  oxyCODONE (OXY  IR/ROXICODONE) 5 MG immediate release tablet Take 1 tablet (5 mg total) by mouth every 4 (four) hours as needed for moderate pain. 04/07/15   Luretha Murphy, MD  oxymetazoline (AFRIN) 0.05 % nasal spray Place 1 spray into both nostrils 2 (two) times daily. 04/07/15   Luretha Murphy, MD  pantoprazole (PROTONIX) 40 MG tablet Take 1 tablet (40 mg total) by mouth daily. 04/26/14   Vassie Loll, MD  potassium chloride SA (K-DUR,KLOR-CON) 20 MEQ tablet Take 1 tablet (20 mEq total) by mouth 2 (two) times daily. Take for 4 days then stop. 11/13/14   Lonia Blood, MD  thiamine 100 MG tablet Take 1 tablet (100 mg total) by mouth daily. Patient not taking: Reported on 04/05/2015 11/13/14   Lonia Blood, MD  Vitamin D, Ergocalciferol, (DRISDOL) 50000 UNITS CAPS capsule Take 50,000 Units by mouth every 7 (seven) days.     Historical Provider, MD   Triage vitals: BP 119/106 mmHg  Pulse 95  Temp(Src) 98.2 F (36.8 C) (Oral)  Resp 21  SpO2 100% Physical Exam  Constitutional: Vital signs are normal. He appears well-developed and well-nourished.  Non-toxic appearance. He does not appear ill. No distress.  c-collar placed, smells of strong urine odor  HENT:  Head: Normocephalic and atraumatic.  Nose: Nose normal.  Mouth/Throat: Oropharynx is clear and moist. No oropharyngeal exudate.  Eyes: Conjunctivae and EOM are normal. Pupils are equal, round, and reactive to light. No scleral icterus.  Neck: Normal range of motion. Neck supple. No tracheal deviation, no edema, no erythema and normal range of motion present. No thyroid mass and no thyromegaly present.  Cardiovascular: Normal rate, regular rhythm, S1 normal, S2 normal, normal heart sounds, intact distal pulses and normal pulses.  Exam reveals no gallop and no friction rub.   No murmur heard. Pulmonary/Chest: Effort normal and breath sounds normal. No respiratory distress. He has no wheezes. He has no rhonchi. He has no rales.  Chest wall TTP  Abdominal:  Soft. Normal appearance and bowel sounds are normal. He exhibits no distension, no ascites and no mass. There is no hepatosplenomegaly. There is no tenderness. There is no rebound, no guarding and no CVA tenderness.  Musculoskeletal: Normal range of motion. He exhibits no edema or tenderness.  Soft tissue bruising bilateral upper extremities, bilateral flank  Lymphadenopathy:    He has no cervical adenopathy.  Neurological: He is alert. He has normal strength. No cranial nerve deficit or sensory deficit.  Skin: Skin is warm,  dry and intact. No petechiae and no rash noted. He is not diaphoretic. No erythema. No pallor.  Psychiatric: He has a normal mood and affect. His behavior is normal. Judgment normal.  Nursing note and vitals reviewed.   ED Course  Procedures  DIAGNOSTIC STUDIES: Oxygen Saturation is 100% on RA, normal by my interpretation.  COORDINATION OF CARE:  11:07 PM Discussed treatment plan which includes lab work, CT of spine, chest, abdomen, and head with pt at bedside and pt agreed to plan.  Labs Review Labs Reviewed  CBC WITH DIFFERENTIAL/PLATELET - Abnormal; Notable for the following:    RBC 3.66 (*)    HCT 36.5 (*)    MCH 36.1 (*)    MCHC 36.2 (*)    Platelets 71 (*)    Lymphs Abs 0.5 (*)    Monocytes Absolute 1.2 (*)    All other components within normal limits  COMPREHENSIVE METABOLIC PANEL - Abnormal; Notable for the following:    Sodium 130 (*)    Potassium 2.7 (*)    Chloride 92 (*)    CO2 21 (*)    Glucose, Bld 119 (*)    Calcium 8.7 (*)    Total Protein 6.1 (*)    Albumin 2.8 (*)    AST 284 (*)    ALT 102 (*)    Alkaline Phosphatase 148 (*)    Total Bilirubin 13.6 (*)    Anion gap 17 (*)    All other components within normal limits  URINALYSIS, ROUTINE W REFLEX MICROSCOPIC (NOT AT [facility]) - Abnormal; Notable for the following:    Color, Urine AMBER (*)    Bilirubin Urine LARGE (*)    Ketones, ur >80 (*)    Protein, ur 30 (*)    Nitrite POSITIVE  (*)    Leukocytes, UA SMALL (*)    All other components within normal limits  CK - Abnormal; Notable for the following:    Total CK 2183 (*)    All other components within normal limits  URINE MICROSCOPIC-ADD ON - Abnormal; Notable for the following:    Squamous Epithelial / LPF 0-5 (*)    Bacteria, UA FEW (*)    Casts GRANULAR CAST (*)    All other components within normal limits  I-STAT TROPOININ, ED - Abnormal; Notable for the following:    Troponin i, poc 0.12 (*)    All other components within normal limits  MAGNESIUM  AMMONIA    Imaging Review Dg Chest 1 View  06/15/2015  CLINICAL DATA:  Found down at home. EXAM: CHEST 1 VIEW COMPARISON:  Chest radiograph April 05, 2015 FINDINGS: Cardiac silhouette is mildly enlarged, mediastinal silhouette is unremarkable for this low inspiratory portable examination with crowded vasculature markings. The lungs are clear without pleural effusions or focal consolidations. Bandlike density LEFT lung base. Stable LEFT apical pleural thickening. Trachea projects midline and there is no pneumothorax. Included soft tissue planes and osseous structures are non-suspicious. Old RIGHT posterior rib fractures. Old RIGHT mid clavicle fracture. Osteopenia. IMPRESSION: Mild cardiomegaly and LEFT lung base atelectasis. Electronically Signed   By: Awilda Metro M.D.   On: 06/15/2015 01:40   Dg Elbow Complete Left  06/15/2015  CLINICAL DATA:  Larey Seat at home. EXAM: LEFT ELBOW - COMPLETE 3+ VIEW COMPARISON:  None. FINDINGS: There is no evidence of fracture, dislocation, or joint effusion. There is no evidence of arthropathy or other focal bone abnormality. Soft tissues are unremarkable. IMPRESSION: Negative. Electronically Signed   By: Bevelyn Buckles  Clovis Riley M.D.   On: 06/15/2015 01:42   Dg Elbow Complete Right  06/15/2015  CLINICAL DATA:  Larey Seat at home EXAM: RIGHT ELBOW - COMPLETE 3+ VIEW COMPARISON:  None. FINDINGS: There is no evidence of fracture, dislocation, or  joint effusion. There is no evidence of arthropathy or other focal bone abnormality. Soft tissues are unremarkable. IMPRESSION: Negative. Electronically Signed   By: Ellery Plunk M.D.   On: 06/15/2015 01:42   Ct Head Wo Contrast  06/15/2015  CLINICAL DATA:  Found down, with bruising about the head. Concern for head or cervical spine injury. Initial encounter. EXAM: CT HEAD WITHOUT CONTRAST CT CERVICAL SPINE WITHOUT CONTRAST TECHNIQUE: Multidetector CT imaging of the head and cervical spine was performed following the standard protocol without intravenous contrast. Multiplanar CT image reconstructions of the cervical spine were also generated. COMPARISON:  None. FINDINGS: CT HEAD FINDINGS There is no evidence of acute infarction, mass lesion, or intra- or extra-axial hemorrhage on CT. Prominence of the ventricles and sulci reflects mild cortical volume loss. Mild cerebellar atrophy is noted. Mild periventricular and subcortical white matter change likely reflects small vessel ischemic microangiopathy. The brainstem and fourth ventricle are within normal limits. The basal ganglia are unremarkable in appearance. The cerebral hemispheres demonstrate grossly normal gray-white differentiation. No mass effect or midline shift is seen. There is no evidence of fracture; visualized osseous structures are unremarkable in appearance. The visualized portions of the orbits are within normal limits. Mucus retention cyst or polyp is noted at the left maxillary sinus. The remaining paranasal sinuses and mastoid air cells are well-aerated. Soft tissue swelling is noted overlying the left parietal calvarium and at the left vertex. CT CERVICAL SPINE FINDINGS There is no evidence of fracture or subluxation. Vertebral bodies demonstrate normal height and alignment. Multilevel disc space narrowing is noted along the lower cervical spine, with small anterior and posterior disc osteophyte complexes. Prevertebral soft tissues are  within normal limits. The visualized neural foramina are grossly unremarkable. The thyroid gland is unremarkable in appearance. No significant soft tissue abnormalities are seen. IMPRESSION: 1. No evidence of traumatic intracranial injury or fracture. 2. No evidence of fracture or subluxation along the cervical spine. 3. Soft tissue swelling overlying the left parietal calvarium and at the left vertex. 4. Mild cortical volume loss and scattered small vessel ischemic microangiopathy. 5. Mild degenerative change along the lower cervical spine. 6. Mucus retention cyst or polyp at the left maxillary sinus. Electronically Signed   By: Roanna Raider M.D.   On: 06/15/2015 02:37   Ct Chest W Contrast  06/15/2015  CLINICAL DATA:  Found down with chest and abdominal bruising. Initial encounter. EXAM: CT CHEST, ABDOMEN, AND PELVIS WITH CONTRAST TECHNIQUE: Multidetector CT imaging of the chest, abdomen and pelvis was performed following the standard protocol during bolus administration of intravenous contrast. CONTRAST:  100 mL ISOVUE-300 IOPAMIDOL (ISOVUE-300) INJECTION 61% COMPARISON:  CT of the chest, abdomen and pelvis performed 04/05/2015 FINDINGS: CT CHEST Minimal bibasilar atelectasis is noted. The lungs are otherwise clear. No pleural effusion or pneumothorax is seen. No masses are identified. There is no evidence of pulmonary parenchymal contusion. Scattered coronary artery calcifications are seen. No pericardial effusion is identified. No mediastinal lymphadenopathy is seen. There is no evidence of venous hemorrhage. The great vessels are grossly unremarkable in appearance. Mild calcification is seen along the aortic arch. The visualized portions of thyroid gland are unremarkable. No axillary lymphadenopathy is seen. There is no evidence of significant soft tissue injury along the  chest wall. No acute osseous abnormalities are identified. Chronic right-sided rib deformities are noted. CT ABDOMEN AND PELVIS No free  air or free fluid is seen within the abdomen or pelvis. There is no evidence of solid or hollow organ injury. There is a diffusely heterogeneous appearance to the liver, with multiple areas of decreased attenuation. Underlying mass cannot be excluded. The appearance raises question for hepatic cirrhosis and regenerative nodules. The gallbladder is within normal limits. The pancreas and adrenal glands are unremarkable. There is recanalization of the umbilical vein, extending to the vessels of the anterior abdominal wall. Scattered gastric and esophageal varices are seen. A tiny umbilical hernia is noted, containing only fat. The kidneys are unremarkable in appearance. There is no evidence of hydronephrosis. No renal or ureteral stones are seen. Mild nonspecific perinephric stranding is noted bilaterally. No free fluid is identified. The small bowel is unremarkable in appearance. The stomach is within normal limits. No acute vascular abnormalities are seen. Mild scattered calcification is seen along the abdominal aorta and its branches. The appendix is not definitely characterized; there is no evidence of appendicitis. Scattered diverticulosis is noted along the descending and sigmoid colon, without evidence of diverticulitis. The bladder is decompressed, with a Foley catheter in place. The prostate is borderline prominent. No inguinal lymphadenopathy is seen. No acute osseous abnormalities are identified. 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 IMPRESSION: 1. No evidence of traumatic injury to the chest, abdomen or pelvis. 2. Changes of hepatic cirrhosis, with underlying diffuse heterogeneity of the liver, concerning for degenerative nodules. Heterogeneity is more prominent than on the prior study. Underlying mass cannot be excluded. Dynamic liver protocol MRI or CT would be helpful for further evaluation. 3. Minimal bibasilar atelectasis noted.  Lungs otherwise clear. 4. Scattered coronary artery  calcifications seen. 5. Recanalization of the umbilical vein, extending to the vessels of the anterior abdominal wall. Scattered gastric and esophageal varices noted. 6. Tiny umbilical hernia, containing only fat. 7. Mild scattered calcification along the abdominal aorta and its branches. 8. Scattered diverticulosis along the descending and sigmoid colon, without evidence of diverticulitis. 9. 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 Electronically Signed   By: Roanna Raider M.D.   On: 06/15/2015 02:47   Ct Cervical Spine Wo Contrast  06/15/2015  CLINICAL DATA:  Found down, with bruising about the head. Concern for head or cervical spine injury. Initial encounter. EXAM: CT HEAD WITHOUT CONTRAST CT CERVICAL SPINE WITHOUT CONTRAST TECHNIQUE: Multidetector CT imaging of the head and cervical spine was performed following the standard protocol without intravenous contrast. Multiplanar CT image reconstructions of the cervical spine were also generated. COMPARISON:  None. FINDINGS: CT HEAD FINDINGS There is no evidence of acute infarction, mass lesion, or intra- or extra-axial hemorrhage on CT. Prominence of the ventricles and sulci reflects mild cortical volume loss. Mild cerebellar atrophy is noted. Mild periventricular and subcortical white matter change likely reflects small vessel ischemic microangiopathy. The brainstem and fourth ventricle are within normal limits. The basal ganglia are unremarkable in appearance. The cerebral hemispheres demonstrate grossly normal gray-white differentiation. No mass effect or midline shift is seen. There is no evidence of fracture; visualized osseous structures are unremarkable in appearance. The visualized portions of the orbits are within normal limits. Mucus retention cyst or polyp is noted at the left maxillary sinus. The remaining paranasal sinuses and mastoid air cells are well-aerated. Soft tissue swelling is noted overlying the left parietal calvarium and at the left  vertex. CT CERVICAL  SPINE FINDINGS There is no evidence of fracture or subluxation. Vertebral bodies demonstrate normal height and alignment. Multilevel disc space narrowing is noted along the lower cervical spine, with small anterior and posterior disc osteophyte complexes. Prevertebral soft tissues are within normal limits. The visualized neural foramina are grossly unremarkable. The thyroid gland is unremarkable in appearance. No significant soft tissue abnormalities are seen. IMPRESSION: 1. No evidence of traumatic intracranial injury or fracture. 2. No evidence of fracture or subluxation along the cervical spine. 3. Soft tissue swelling overlying the left parietal calvarium and at the left vertex. 4. Mild cortical volume loss and scattered small vessel ischemic microangiopathy. 5. Mild degenerative change along the lower cervical spine. 6. Mucus retention cyst or polyp at the left maxillary sinus. Electronically Signed   By: Roanna Raider M.D.   On: 06/15/2015 02:37   Ct Abdomen Pelvis W Contrast  06/15/2015  CLINICAL DATA:  Found down with chest and abdominal bruising. Initial encounter. EXAM: CT CHEST, ABDOMEN, AND PELVIS WITH CONTRAST TECHNIQUE: Multidetector CT imaging of the chest, abdomen and pelvis was performed following the standard protocol during bolus administration of intravenous contrast. CONTRAST:  100 mL ISOVUE-300 IOPAMIDOL (ISOVUE-300) INJECTION 61% COMPARISON:  CT of the chest, abdomen and pelvis performed 04/05/2015 FINDINGS: CT CHEST Minimal bibasilar atelectasis is noted. The lungs are otherwise clear. No pleural effusion or pneumothorax is seen. No masses are identified. There is no evidence of pulmonary parenchymal contusion. Scattered coronary artery calcifications are seen. No pericardial effusion is identified. No mediastinal lymphadenopathy is seen. There is no evidence of venous hemorrhage. The great vessels are grossly unremarkable in appearance. Mild calcification is seen  along the aortic arch. The visualized portions of thyroid gland are unremarkable. No axillary lymphadenopathy is seen. There is no evidence of significant soft tissue injury along the chest wall. No acute osseous abnormalities are identified. Chronic right-sided rib deformities are noted. CT ABDOMEN AND PELVIS No free air or free fluid is seen within the abdomen or pelvis. There is no evidence of solid or hollow organ injury. There is a diffusely heterogeneous appearance to the liver, with multiple areas of decreased attenuation. Underlying mass cannot be excluded. The appearance raises question for hepatic cirrhosis and regenerative nodules. The gallbladder is within normal limits. The pancreas and adrenal glands are unremarkable. There is recanalization of the umbilical vein, extending to the vessels of the anterior abdominal wall. Scattered gastric and esophageal varices are seen. A tiny umbilical hernia is noted, containing only fat. The kidneys are unremarkable in appearance. There is no evidence of hydronephrosis. No renal or ureteral stones are seen. Mild nonspecific perinephric stranding is noted bilaterally. No free fluid is identified. The small bowel is unremarkable in appearance. The stomach is within normal limits. No acute vascular abnormalities are seen. Mild scattered calcification is seen along the abdominal aorta and its branches. The appendix is not definitely characterized; there is no evidence of appendicitis. Scattered diverticulosis is noted along the descending and sigmoid colon, without evidence of diverticulitis. The bladder is decompressed, with a Foley catheter in place. The prostate is borderline prominent. No inguinal lymphadenopathy is seen. No acute osseous abnormalities are identified. 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 IMPRESSION: 1. No evidence of traumatic injury to the chest, abdomen or pelvis. 2. Changes of hepatic cirrhosis, with underlying diffuse heterogeneity  of the liver, concerning for degenerative nodules. Heterogeneity is more prominent than on the prior study. Underlying mass cannot be excluded. Dynamic liver protocol MRI or  CT would be helpful for further evaluation. 3. Minimal bibasilar atelectasis noted.  Lungs otherwise clear. 4. Scattered coronary artery calcifications seen. 5. Recanalization of the umbilical vein, extending to the vessels of the anterior abdominal wall. Scattered gastric and esophageal varices noted. 6. Tiny umbilical hernia, containing only fat. 7. Mild scattered calcification along the abdominal aorta and its branches. 8. Scattered diverticulosis along the descending and sigmoid colon, without evidence of diverticulitis. 9. 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 Electronically Signed   By: Roanna Raider M.D.   On: 06/15/2015 02:47   I have personally reviewed and evaluated these images and lab results as part of my medical decision-making.   EKG Interpretation   Date/Time:  Thursday June 14 2015 22:23:43 EDT Ventricular Rate:  90 PR Interval:  185 QRS Duration: 111 QT Interval:  401 QTC Calculation: 491 R Axis:   -65 Text Interpretation:  Sinus rhythm Inferior infarct, old No significant  change since last tracing Confirmed by Erroll Luna [phone]) on  06/14/2015 10:59:46 PM      MDM   Final diagnoses:  None    Patient presents to the ED after being found down for several days in the prone position.  He currently states he only has pain to his B elbows, but he has significant bruising throughout his body with a [place] sign.  Will obtain CT scan for evaluation for any trauma.  He was given 2L IVF for possible rhabdo and condom cath was placed.  Troponin elevated at 0.12, no signs of ischemia on EKG.  I discussed with cardiology at 12:07 AM and they agree this is likely muscle wasting, especially with an elevated CK.  Plan to tend troponin in the hospital.  3:05 AM I spoke with Dr. Maryfrances Bunnell who accepts  the patient for admission.  I have ordered an ammonia level as well.     I personally performed the services described in this documentation, which was scribed in my presence. The recorded information has been reviewed and is accurate.     Tomasita Crumble, MD 06/15/15 [phone]

## 2015-06-15 ENCOUNTER — Inpatient Hospital Stay (HOSPITAL_COMMUNITY): Payer: Medicare Other

## 2015-06-15 ENCOUNTER — Encounter (HOSPITAL_COMMUNITY): Payer: Self-pay | Admitting: Family Medicine

## 2015-06-15 ENCOUNTER — Emergency Department (HOSPITAL_COMMUNITY): Payer: Medicare Other

## 2015-06-15 ENCOUNTER — Other Ambulatory Visit (HOSPITAL_COMMUNITY): Payer: Self-pay

## 2015-06-15 DIAGNOSIS — Z79899 Other long term (current) drug therapy: Secondary | ICD-10-CM | POA: Diagnosis not present

## 2015-06-15 DIAGNOSIS — D6959 Other secondary thrombocytopenia: Secondary | ICD-10-CM | POA: Diagnosis present

## 2015-06-15 DIAGNOSIS — K7290 Hepatic failure, unspecified without coma: Secondary | ICD-10-CM | POA: Diagnosis present

## 2015-06-15 DIAGNOSIS — K7011 Alcoholic hepatitis with ascites: Secondary | ICD-10-CM | POA: Diagnosis present

## 2015-06-15 DIAGNOSIS — R401 Stupor: Secondary | ICD-10-CM | POA: Diagnosis not present

## 2015-06-15 DIAGNOSIS — K7031 Alcoholic cirrhosis of liver with ascites: Secondary | ICD-10-CM | POA: Diagnosis not present

## 2015-06-15 DIAGNOSIS — R7989 Other specified abnormal findings of blood chemistry: Secondary | ICD-10-CM | POA: Diagnosis not present

## 2015-06-15 DIAGNOSIS — W010XXA Fall on same level from slipping, tripping and stumbling without subsequent striking against object, initial encounter: Secondary | ICD-10-CM | POA: Diagnosis present

## 2015-06-15 DIAGNOSIS — I519 Heart disease, unspecified: Secondary | ICD-10-CM | POA: Diagnosis not present

## 2015-06-15 DIAGNOSIS — W19XXXA Unspecified fall, initial encounter: Secondary | ICD-10-CM | POA: Diagnosis not present

## 2015-06-15 DIAGNOSIS — K7010 Alcoholic hepatitis without ascites: Secondary | ICD-10-CM | POA: Diagnosis not present

## 2015-06-15 DIAGNOSIS — R748 Abnormal levels of other serum enzymes: Secondary | ICD-10-CM | POA: Diagnosis present

## 2015-06-15 DIAGNOSIS — N390 Urinary tract infection, site not specified: Secondary | ICD-10-CM | POA: Diagnosis present

## 2015-06-15 DIAGNOSIS — R55 Syncope and collapse: Secondary | ICD-10-CM | POA: Diagnosis not present

## 2015-06-15 DIAGNOSIS — S20229A Contusion of unspecified back wall of thorax, initial encounter: Secondary | ICD-10-CM | POA: Diagnosis present

## 2015-06-15 DIAGNOSIS — R778 Other specified abnormalities of plasma proteins: Secondary | ICD-10-CM | POA: Diagnosis present

## 2015-06-15 DIAGNOSIS — I110 Hypertensive heart disease with heart failure: Secondary | ICD-10-CM | POA: Diagnosis present

## 2015-06-15 DIAGNOSIS — R4182 Altered mental status, unspecified: Secondary | ICD-10-CM | POA: Diagnosis present

## 2015-06-15 DIAGNOSIS — Z8249 Family history of ischemic heart disease and other diseases of the circulatory system: Secondary | ICD-10-CM | POA: Diagnosis not present

## 2015-06-15 DIAGNOSIS — R931 Abnormal findings on diagnostic imaging of heart and coronary circulation: Secondary | ICD-10-CM | POA: Diagnosis not present

## 2015-06-15 DIAGNOSIS — E876 Hypokalemia: Secondary | ICD-10-CM | POA: Diagnosis present

## 2015-06-15 DIAGNOSIS — K7460 Unspecified cirrhosis of liver: Secondary | ICD-10-CM | POA: Diagnosis present

## 2015-06-15 DIAGNOSIS — S20219A Contusion of unspecified front wall of thorax, initial encounter: Secondary | ICD-10-CM | POA: Diagnosis present

## 2015-06-15 DIAGNOSIS — I2510 Atherosclerotic heart disease of native coronary artery without angina pectoris: Secondary | ICD-10-CM | POA: Diagnosis not present

## 2015-06-15 DIAGNOSIS — K766 Portal hypertension: Secondary | ICD-10-CM | POA: Diagnosis not present

## 2015-06-15 DIAGNOSIS — E785 Hyperlipidemia, unspecified: Secondary | ICD-10-CM | POA: Diagnosis present

## 2015-06-15 DIAGNOSIS — I471 Supraventricular tachycardia: Secondary | ICD-10-CM | POA: Diagnosis present

## 2015-06-15 DIAGNOSIS — K7030 Alcoholic cirrhosis of liver without ascites: Secondary | ICD-10-CM | POA: Diagnosis not present

## 2015-06-15 DIAGNOSIS — M6282 Rhabdomyolysis: Secondary | ICD-10-CM | POA: Diagnosis present

## 2015-06-15 DIAGNOSIS — S301XXA Contusion of abdominal wall, initial encounter: Secondary | ICD-10-CM | POA: Diagnosis present

## 2015-06-15 DIAGNOSIS — D696 Thrombocytopenia, unspecified: Secondary | ICD-10-CM

## 2015-06-15 DIAGNOSIS — E871 Hypo-osmolality and hyponatremia: Secondary | ICD-10-CM | POA: Diagnosis present

## 2015-06-15 HISTORY — DX: Altered mental status, unspecified: R41.82

## 2015-06-15 LAB — COMPREHENSIVE METABOLIC PANEL
ALT: 92 U/L — AB (ref 17–63)
AST: 274 U/L — AB (ref 15–41)
Albumin: 2.5 g/dL — ABNORMAL LOW (ref 3.5–5.0)
Alkaline Phosphatase: 122 U/L (ref 38–126)
Anion gap: 16 — ABNORMAL HIGH (ref 5–15)
BILIRUBIN TOTAL: 13.1 mg/dL — AB (ref 0.3–1.2)
BUN: 14 mg/dL (ref 6–20)
CALCIUM: 8 mg/dL — AB (ref 8.9–10.3)
CO2: 21 mmol/L — ABNORMAL LOW (ref 22–32)
CREATININE: 1.03 mg/dL (ref 0.61–1.24)
Chloride: 98 mmol/L — ABNORMAL LOW (ref 101–111)
Glucose, Bld: 101 mg/dL — ABNORMAL HIGH (ref 65–99)
Potassium: 3 mmol/L — ABNORMAL LOW (ref 3.5–5.1)
Sodium: 135 mmol/L (ref 135–145)
TOTAL PROTEIN: 5.3 g/dL — AB (ref 6.5–8.1)

## 2015-06-15 LAB — TROPONIN I
TROPONIN I: 0.1 ng/mL — AB (ref <0.031)
Troponin I: 0.11 ng/mL — ABNORMAL HIGH (ref <0.031)
Troponin I: 0.08 ng/mL — ABNORMAL HIGH (ref <0.031)

## 2015-06-15 LAB — IRON AND TIBC
IRON: 100 ug/dL (ref 45–182)
SATURATION RATIOS: 94 % — AB (ref 17.9–39.5)
TIBC: 106 ug/dL — AB (ref 250–450)
UIBC: 6 ug/dL

## 2015-06-15 LAB — TSH: TSH: 0.916 u[IU]/mL (ref 0.350–4.500)

## 2015-06-15 LAB — CBC
HEMATOCRIT: 32.6 % — AB (ref 39.0–52.0)
Hemoglobin: 11.7 g/dL — ABNORMAL LOW (ref 13.0–17.0)
MCH: 36.1 pg — AB (ref 26.0–34.0)
MCHC: 35.9 g/dL (ref 30.0–36.0)
MCV: 100.6 fL — AB (ref 78.0–100.0)
Platelets: 60 10*3/uL — ABNORMAL LOW (ref 150–400)
RBC: 3.24 MIL/uL — ABNORMAL LOW (ref 4.22–5.81)
RDW: 15.4 % (ref 11.5–15.5)
WBC: 7 10*3/uL (ref 4.0–10.5)

## 2015-06-15 LAB — URINALYSIS, ROUTINE W REFLEX MICROSCOPIC
GLUCOSE, UA: NEGATIVE mg/dL
HGB URINE DIPSTICK: NEGATIVE
Nitrite: POSITIVE — AB
PROTEIN: 30 mg/dL — AB
Specific Gravity, Urine: 1.023 (ref 1.005–1.030)
pH: 6 (ref 5.0–8.0)

## 2015-06-15 LAB — VITAMIN B12: Vitamin B-12: 1494 pg/mL — ABNORMAL HIGH (ref 180–914)

## 2015-06-15 LAB — RETICULOCYTES
RBC.: 3.41 MIL/uL — ABNORMAL LOW (ref 4.22–5.81)
RETIC CT PCT: 3.6 % — AB (ref 0.4–3.1)
Retic Count, Absolute: 122.8 10*3/uL (ref 19.0–186.0)

## 2015-06-15 LAB — URINE MICROSCOPIC-ADD ON

## 2015-06-15 LAB — AMMONIA: AMMONIA: 92 umol/L — AB (ref 9–35)

## 2015-06-15 LAB — FERRITIN: FERRITIN: 1542 ng/mL — AB (ref 24–336)

## 2015-06-15 LAB — LACTIC ACID, PLASMA
Lactic Acid, Venous: 2 mmol/L (ref 0.5–2.0)
Lactic Acid, Venous: 1.3 mmol/L (ref 0.5–2.0)

## 2015-06-15 LAB — SODIUM, URINE, RANDOM: Sodium, Ur: 33 mmol/L

## 2015-06-15 LAB — OSMOLALITY, URINE: OSMOLALITY UR: 602 mosm/kg (ref 300–900)

## 2015-06-15 LAB — CK
CK TOTAL: 2800 U/L — AB (ref 49–397)
CK TOTAL: 1846 U/L — AB (ref 49–397)

## 2015-06-15 LAB — OSMOLALITY: Osmolality: 286 mOsm/kg (ref 275–295)

## 2015-06-15 LAB — MAGNESIUM: Magnesium: 1.9 mg/dL (ref 1.7–2.4)

## 2015-06-15 LAB — FOLATE: Folate: 6.7 ng/mL (ref >5.9)

## 2015-06-15 MED ORDER — METOPROLOL TARTRATE 1 MG/ML IV SOLN
5.0000 mg | Freq: Once | INTRAVENOUS | Status: AC
  Administered 2015-06-15: 5 mg via INTRAVENOUS
  Filled 2015-06-15: qty 5

## 2015-06-15 MED ORDER — POTASSIUM CHLORIDE IN NACL 20-0.9 MEQ/L-% IV SOLN
INTRAVENOUS | Status: AC
  Administered 2015-06-15: 1000 mL via INTRAVENOUS
  Filled 2015-06-15: qty 1000

## 2015-06-15 MED ORDER — SENNOSIDES-DOCUSATE SODIUM 8.6-50 MG PO TABS: 1.0000 | ORAL_TABLET | Freq: Every evening | ORAL | Status: DC | PRN

## 2015-06-15 MED ORDER — FOLIC ACID 1 MG PO TABS
1.0000 mg | ORAL_TABLET | Freq: Every day | ORAL | Status: DC
  Administered 2015-06-16 – 2015-06-20 (×4): 1 mg via ORAL
  Filled 2015-06-15 (×4): qty 1

## 2015-06-15 MED ORDER — SODIUM CHLORIDE 0.9 % IV SOLN
INTRAVENOUS | Status: DC
  Administered 2015-06-15: 23:00:00 via INTRAVENOUS

## 2015-06-15 MED ORDER — SODIUM CHLORIDE 0.9% FLUSH
3.0000 mL | Freq: Two times a day (BID) | INTRAVENOUS | Status: DC
  Administered 2015-06-15 – 2015-06-20 (×6): 3 mL via INTRAVENOUS

## 2015-06-15 MED ORDER — IOPAMIDOL (ISOVUE-300) INJECTION 61%
INTRAVENOUS | Status: AC
  Administered 2015-06-15: 100 mL
  Filled 2015-06-15: qty 100

## 2015-06-15 MED ORDER — PANTOPRAZOLE SODIUM 40 MG PO TBEC
40.0000 mg | DELAYED_RELEASE_TABLET | Freq: Every day | ORAL | Status: DC
  Administered 2015-06-16 – 2015-06-20 (×4): 40 mg via ORAL
  Filled 2015-06-15 (×4): qty 1

## 2015-06-15 MED ORDER — LORAZEPAM 2 MG/ML IJ SOLN
1.0000 mg | Freq: Four times a day (QID) | INTRAMUSCULAR | Status: AC | PRN
  Filled 2015-06-15: qty 1

## 2015-06-15 MED ORDER — POTASSIUM CHLORIDE CRYS ER 20 MEQ PO TBCR
20.0000 meq | EXTENDED_RELEASE_TABLET | Freq: Two times a day (BID) | ORAL | Status: DC
  Administered 2015-06-15 (×2): 20 meq via ORAL
  Filled 2015-06-15 (×2): qty 1

## 2015-06-15 MED ORDER — LORAZEPAM 1 MG PO TABS: 1.0000 mg | ORAL_TABLET | Freq: Four times a day (QID) | ORAL | Status: AC | PRN

## 2015-06-15 MED ORDER — SODIUM CHLORIDE 0.9 % IV SOLN
INTRAVENOUS | Status: DC
  Administered 2015-06-15: 1000 mL via INTRAVENOUS

## 2015-06-15 MED ORDER — LACTULOSE ENEMA
300.0000 mL | Freq: Two times a day (BID) | RECTAL | Status: DC
Start: 2015-06-15 — End: 2015-06-16
  Administered 2015-06-15: 300 mL via RECTAL
  Filled 2015-06-15 (×3): qty 300

## 2015-06-15 MED ORDER — LACTULOSE 10 GM/15ML PO SOLN
30.0000 g | Freq: Three times a day (TID) | ORAL | Status: DC
  Administered 2015-06-15 – 2015-06-20 (×12): 30 g via ORAL
  Filled 2015-06-15 (×16): qty 45

## 2015-06-15 MED ORDER — OXYCODONE HCL 5 MG PO TABS: 5.0000 mg | ORAL_TABLET | ORAL | Status: DC | PRN

## 2015-06-15 MED ORDER — ADULT MULTIVITAMIN W/MINERALS CH
1.0000 | ORAL_TABLET | Freq: Every day | ORAL | Status: DC
  Administered 2015-06-16 – 2015-06-20 (×4): 1 via ORAL
  Filled 2015-06-15 (×4): qty 1

## 2015-06-15 MED ORDER — DEXTROSE 5 % IV SOLN
1.0000 g | INTRAVENOUS | Status: DC
  Administered 2015-06-15 – 2015-06-18 (×4): 1 g via INTRAVENOUS
  Filled 2015-06-15 (×4): qty 10

## 2015-06-15 MED ORDER — VITAMIN B-1 100 MG PO TABS
100.0000 mg | ORAL_TABLET | Freq: Every day | ORAL | Status: DC
Start: 2015-06-15 — End: 2015-06-20
  Administered 2015-06-16 – 2015-06-20 (×4): 100 mg via ORAL
  Filled 2015-06-15 (×4): qty 1

## 2015-06-15 NOTE — Progress Notes (Signed)
Pharmacy consulted for ceftriaxone dosing for UTI. Start ceftriaxone 1 gram IV q 24H. Will s/o as require no dose adjustments.  Thanks,  Pollyann Samples, PharmD, BCPS 06/15/2015, 5:18 AM Pager: [phone]

## 2015-06-15 NOTE — Progress Notes (Signed)
[facility]                                                                              Patient Demographics  Gary [name], is a 65 y.o. male, DOB - 05-26-1950, ZOX:[id number]  Admit date - 06/14/2015   Admitting Physician Alberteen Sam, MD  Outpatient Primary MD for the patient is [name], NP  LOS - 0  days    Chief Complaint  Patient presents with  . Altered Mental Status       Brief HPI  Gary [name] is a 65 y.o. male with a past medical history significant for alcoholic cirrhosis, HTN who presented after unwitnessed fall, found down. Evidently, his wife is in [place] (he is able to sort of relate this), the patient was last seen about 3-4 days ago by neighbors. His sister had called her neighbor to check on him and patient was found face down on the floor of his home, disoriented, weak. EMS were called who found him awake, interactive, oriented only to person and place, slowly following commands, weak, dry mucous membranes, very bruised, jaundiced, incontinent of urine. In the ED, he was tachycardic, afebrile, saturating well on ambient air. Remained confused and jaundiced. Na 130, K 2.7, Cr. 1.1, TBili 13, AST/ALT 280/100, WBC 8K, Hgb 13, platetelets 71K (at baseline). CK 2000, mild troponin leak. Cards thought troponin leak was expected given found down and degree of CK elevation. CT imaging of the head, C-spine, chest abdomen and pelvis with contrast were unremarkable except for increased heterogeneity of the liver. Flat radiographs of both elbows were negative for fracture. Chest x-ray clear. Got 2L NS and ceftriaxone for UTI, and TRH were asked to evaluate for admission.     Assessment & Plan   Altered mental status: Multifactorial, possibility of hepatic encephalopathy with cirrhosis, UTI, multiple metabolic abnormalities including hyponatremia, hypokalemia, rhabdomyolysis - No focal neurological deficits noted, CT head negative - Rule out  seizure, check EEG, MRI of the brain - Ammonia level elevated at 92, place on lactulose enema - TSH normal - UA positive for UTI, follow urine cultures, continue IV Rocephin - MCV 100.6, check B12, folate   Hyperbilirubinemia: Likely due to liver cirrhosis, has macrocytic anemia, thrombocytopenia, hepatic encephalopathy/hyperammonemia - CT abdomen showed hepatic cirrhosis with degenerative nodules, obtain abdominal ultrasound for biliary tree imaging/any biliary dilatation    Essential hypertension - Normotensive at admission. -Hold furosemide, lisinopril, metoprolol for now  Rhabdomyolysis: Likely due to immobility, found down -Continue IV fluid hydration, trend his CKs   Thrombocytopenia: Due to liver cirrhosis Caused by #3. Stable from previous.  Hypokalemia:  -Continue IV fluids with potassium supplementation  Hyponatremia: Improving likely due to hypovolemia and dehydration   UTI: - follow urine culture, continue IV Rocephin  Elevated troponin - Possible troponin leak due to acute rhabdomyolysis however obtain 2-D echocardiogram to rule out wall motion abnormalities, EF   Code Status: Full code   Family Communication: No family member at the bedside  Disposition Plan:   Time Spent in minutes   25 minutes  Procedures  CT abd, head  Consults   None  DVT Prophylaxis  SCD's  Medications  Scheduled Meds: . cefTRIAXone (ROCEPHIN)  IV  1 g Intravenous Q24H  . folic acid  1 mg Oral Daily  . lactulose  30 g Oral TID  . pantoprazole  40 mg Oral Daily  . potassium chloride SA  20 mEq Oral BID  . sodium chloride flush  3 mL Intravenous Q12H  . thiamine  100 mg Oral Daily   Continuous Infusions: . sodium chloride 1,000 mL (06/15/15 0607)  . 0.9 % NaCl with KCl 20 mEq / L 1,000 mL (06/15/15 0616)   PRN Meds:.oxyCODONE, senna-docusate   Antibiotics   Anti-infectives    Start     Dose/Rate Route Frequency Ordered Stop   06/15/15 0530  cefTRIAXone  (ROCEPHIN) 1 g in dextrose 5 % 50 mL IVPB     1 g 100 mL/hr over 30 Minutes Intravenous Every 24 hours 06/15/15 0517 06/22/15 0529        Subjective:   Gary [name] was seen and examined today. Very somnolent, oriented to self only, otherwise very difficult to obtain review of system. No fevers.  Objective:   Filed Vitals:   06/15/15 0245 06/15/15 0315 06/15/15 0428 06/15/15 0452  BP: 139/88 115/91 121/88   Pulse: 83 100 98   Temp:   98.1 F (36.7 C)   TempSrc:      Resp: 27 30 20    Height:    5\' 10"  (1.778 m)  SpO2: 99% 100% 100%     Intake/Output Summary (Last 24 hours) at 06/15/15 1242 Last data filed at 06/15/15 0700  Gross per 24 hour  Intake 255.83 ml  Output    400 ml  Net -144.17 ml     Wt Readings from Last 3 Encounters:  04/05/15 83.915 kg (185 lb)  11/21/14 82.158 kg (181 lb 2 oz)  11/10/14 82.7 kg (182 lb 5.1 oz)     Exam  General: somnolent oriented x 1, NAD  HEENT:  PERRLA, EOMI, icteric Sclera, mucous membranes moist.   Neck: Supple, no JVD  CVS: S1 S2 auscultated, no rubs, murmurs or gallops. Regular rate and rhythm.  Respiratory: Clear to auscultation bilaterally, no wheezing, rales or rhonchi  Abdomen: Soft, nontender, nondistended, + bowel sounds  Ext: no cyanosis clubbing or edema  Neuro: able to lift up both legs against gravity, however unable to obtain upper extremity examination  Skin: No rashes  Psych: confused   Data Reviewed:  I have personally reviewed following labs and imaging studies  Micro Results No results found for this or any previous visit (from the past 240 hour(s)).  Radiology Reports Dg Chest 1 View  06/15/2015  CLINICAL DATA:  Found down at home. EXAM: CHEST 1 VIEW COMPARISON:  Chest radiograph April 05, 2015 FINDINGS: Cardiac silhouette is mildly enlarged, mediastinal silhouette is unremarkable for this low inspiratory portable examination with crowded vasculature markings. The lungs are clear without  pleural effusions or focal consolidations. Bandlike density LEFT lung base. Stable LEFT apical pleural thickening. Trachea projects midline and there is no pneumothorax. Included soft tissue planes and osseous structures are non-suspicious. Old RIGHT posterior rib fractures. Old RIGHT mid clavicle fracture. Osteopenia. IMPRESSION: Mild cardiomegaly and LEFT lung base atelectasis. Electronically Signed   By: Awilda Metro M.D.   On: 06/15/2015 01:40   Dg Elbow Complete Left  06/15/2015  CLINICAL DATA:  Larey Seat at home. EXAM: LEFT ELBOW - COMPLETE 3+ VIEW COMPARISON:  None. FINDINGS: There is no evidence of fracture, dislocation, or  joint effusion. There is no evidence of arthropathy or other focal bone abnormality. Soft tissues are unremarkable. IMPRESSION: Negative. Electronically Signed   By: Ellery Plunk M.D.   On: 06/15/2015 01:42   Dg Elbow Complete Right  06/15/2015  CLINICAL DATA:  Larey Seat at home EXAM: RIGHT ELBOW - COMPLETE 3+ VIEW COMPARISON:  None. FINDINGS: There is no evidence of fracture, dislocation, or joint effusion. There is no evidence of arthropathy or other focal bone abnormality. Soft tissues are unremarkable. IMPRESSION: Negative. Electronically Signed   By: Ellery Plunk M.D.   On: 06/15/2015 01:42   Ct Head Wo Contrast  06/15/2015  CLINICAL DATA:  Found down, with bruising about the head. Concern for head or cervical spine injury. Initial encounter. EXAM: CT HEAD WITHOUT CONTRAST CT CERVICAL SPINE WITHOUT CONTRAST TECHNIQUE: Multidetector CT imaging of the head and cervical spine was performed following the standard protocol without intravenous contrast. Multiplanar CT image reconstructions of the cervical spine were also generated. COMPARISON:  None. FINDINGS: CT HEAD FINDINGS There is no evidence of acute infarction, mass lesion, or intra- or extra-axial hemorrhage on CT. Prominence of the ventricles and sulci reflects mild cortical volume loss. Mild cerebellar atrophy is  noted. Mild periventricular and subcortical white matter change likely reflects small vessel ischemic microangiopathy. The brainstem and fourth ventricle are within normal limits. The basal ganglia are unremarkable in appearance. The cerebral hemispheres demonstrate grossly normal gray-white differentiation. No mass effect or midline shift is seen. There is no evidence of fracture; visualized osseous structures are unremarkable in appearance. The visualized portions of the orbits are within normal limits. Mucus retention cyst or polyp is noted at the left maxillary sinus. The remaining paranasal sinuses and mastoid air cells are well-aerated. Soft tissue swelling is noted overlying the left parietal calvarium and at the left vertex. CT CERVICAL SPINE FINDINGS There is no evidence of fracture or subluxation. Vertebral bodies demonstrate normal height and alignment. Multilevel disc space narrowing is noted along the lower cervical spine, with small anterior and posterior disc osteophyte complexes. Prevertebral soft tissues are within normal limits. The visualized neural foramina are grossly unremarkable. The thyroid gland is unremarkable in appearance. No significant soft tissue abnormalities are seen. IMPRESSION: 1. No evidence of traumatic intracranial injury or fracture. 2. No evidence of fracture or subluxation along the cervical spine. 3. Soft tissue swelling overlying the left parietal calvarium and at the left vertex. 4. Mild cortical volume loss and scattered small vessel ischemic microangiopathy. 5. Mild degenerative change along the lower cervical spine. 6. Mucus retention cyst or polyp at the left maxillary sinus. Electronically Signed   By: Roanna Raider M.D.   On: 06/15/2015 02:37   Ct Chest W Contrast  06/15/2015  CLINICAL DATA:  Found down with chest and abdominal bruising. Initial encounter. EXAM: CT CHEST, ABDOMEN, AND PELVIS WITH CONTRAST TECHNIQUE: Multidetector CT imaging of the chest, abdomen  and pelvis was performed following the standard protocol during bolus administration of intravenous contrast. CONTRAST:  100 mL ISOVUE-300 IOPAMIDOL (ISOVUE-300) INJECTION 61% COMPARISON:  CT of the chest, abdomen and pelvis performed 04/05/2015 FINDINGS: CT CHEST Minimal bibasilar atelectasis is noted. The lungs are otherwise clear. No pleural effusion or pneumothorax is seen. No masses are identified. There is no evidence of pulmonary parenchymal contusion. Scattered coronary artery calcifications are seen. No pericardial effusion is identified. No mediastinal lymphadenopathy is seen. There is no evidence of venous hemorrhage. The great vessels are grossly unremarkable in appearance. Mild calcification is seen along the  aortic arch. The visualized portions of thyroid gland are unremarkable. No axillary lymphadenopathy is seen. There is no evidence of significant soft tissue injury along the chest wall. No acute osseous abnormalities are identified. Chronic right-sided rib deformities are noted. CT ABDOMEN AND PELVIS No free air or free fluid is seen within the abdomen or pelvis. There is no evidence of solid or hollow organ injury. There is a diffusely heterogeneous appearance to the liver, with multiple areas of decreased attenuation. Underlying mass cannot be excluded. The appearance raises question for hepatic cirrhosis and regenerative nodules. The gallbladder is within normal limits. The pancreas and adrenal glands are unremarkable. There is recanalization of the umbilical vein, extending to the vessels of the anterior abdominal wall. Scattered gastric and esophageal varices are seen. A tiny umbilical hernia is noted, containing only fat. The kidneys are unremarkable in appearance. There is no evidence of hydronephrosis. No renal or ureteral stones are seen. Mild nonspecific perinephric stranding is noted bilaterally. No free fluid is identified. The small bowel is unremarkable in appearance. The stomach is  within normal limits. No acute vascular abnormalities are seen. Mild scattered calcification is seen along the abdominal aorta and its branches. The appendix is not definitely characterized; there is no evidence of appendicitis. Scattered diverticulosis is noted along the descending and sigmoid colon, without evidence of diverticulitis. The bladder is decompressed, with a Foley catheter in place. The prostate is borderline prominent. No inguinal lymphadenopathy is seen. No acute osseous abnormalities are identified. 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 IMPRESSION: 1. No evidence of traumatic injury to the chest, abdomen or pelvis. 2. Changes of hepatic cirrhosis, with underlying diffuse heterogeneity of the liver, concerning for degenerative nodules. Heterogeneity is more prominent than on the prior study. Underlying mass cannot be excluded. Dynamic liver protocol MRI or CT would be helpful for further evaluation. 3. Minimal bibasilar atelectasis noted.  Lungs otherwise clear. 4. Scattered coronary artery calcifications seen. 5. Recanalization of the umbilical vein, extending to the vessels of the anterior abdominal wall. Scattered gastric and esophageal varices noted. 6. Tiny umbilical hernia, containing only fat. 7. Mild scattered calcification along the abdominal aorta and its branches. 8. Scattered diverticulosis along the descending and sigmoid colon, without evidence of diverticulitis. 9. 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 Electronically Signed   By: Roanna Raider M.D.   On: 06/15/2015 02:47   Ct Cervical Spine Wo Contrast  06/15/2015  CLINICAL DATA:  Found down, with bruising about the head. Concern for head or cervical spine injury. Initial encounter. EXAM: CT HEAD WITHOUT CONTRAST CT CERVICAL SPINE WITHOUT CONTRAST TECHNIQUE: Multidetector CT imaging of the head and cervical spine was performed following the standard protocol without intravenous contrast. Multiplanar CT image  reconstructions of the cervical spine were also generated. COMPARISON:  None. FINDINGS: CT HEAD FINDINGS There is no evidence of acute infarction, mass lesion, or intra- or extra-axial hemorrhage on CT. Prominence of the ventricles and sulci reflects mild cortical volume loss. Mild cerebellar atrophy is noted. Mild periventricular and subcortical white matter change likely reflects small vessel ischemic microangiopathy. The brainstem and fourth ventricle are within normal limits. The basal ganglia are unremarkable in appearance. The cerebral hemispheres demonstrate grossly normal gray-white differentiation. No mass effect or midline shift is seen. There is no evidence of fracture; visualized osseous structures are unremarkable in appearance. The visualized portions of the orbits are within normal limits. Mucus retention cyst or polyp is noted at the left maxillary sinus. The remaining  paranasal sinuses and mastoid air cells are well-aerated. Soft tissue swelling is noted overlying the left parietal calvarium and at the left vertex. CT CERVICAL SPINE FINDINGS There is no evidence of fracture or subluxation. Vertebral bodies demonstrate normal height and alignment. Multilevel disc space narrowing is noted along the lower cervical spine, with small anterior and posterior disc osteophyte complexes. Prevertebral soft tissues are within normal limits. The visualized neural foramina are grossly unremarkable. The thyroid gland is unremarkable in appearance. No significant soft tissue abnormalities are seen. IMPRESSION: 1. No evidence of traumatic intracranial injury or fracture. 2. No evidence of fracture or subluxation along the cervical spine. 3. Soft tissue swelling overlying the left parietal calvarium and at the left vertex. 4. Mild cortical volume loss and scattered small vessel ischemic microangiopathy. 5. Mild degenerative change along the lower cervical spine. 6. Mucus retention cyst or polyp at the left maxillary  sinus. Electronically Signed   By: Roanna Raider M.D.   On: 06/15/2015 02:37   Ct Abdomen Pelvis W Contrast  06/15/2015  CLINICAL DATA:  Found down with chest and abdominal bruising. Initial encounter. EXAM: CT CHEST, ABDOMEN, AND PELVIS WITH CONTRAST TECHNIQUE: Multidetector CT imaging of the chest, abdomen and pelvis was performed following the standard protocol during bolus administration of intravenous contrast. CONTRAST:  100 mL ISOVUE-300 IOPAMIDOL (ISOVUE-300) INJECTION 61% COMPARISON:  CT of the chest, abdomen and pelvis performed 04/05/2015 FINDINGS: CT CHEST Minimal bibasilar atelectasis is noted. The lungs are otherwise clear. No pleural effusion or pneumothorax is seen. No masses are identified. There is no evidence of pulmonary parenchymal contusion. Scattered coronary artery calcifications are seen. No pericardial effusion is identified. No mediastinal lymphadenopathy is seen. There is no evidence of venous hemorrhage. The great vessels are grossly unremarkable in appearance. Mild calcification is seen along the aortic arch. The visualized portions of thyroid gland are unremarkable. No axillary lymphadenopathy is seen. There is no evidence of significant soft tissue injury along the chest wall. No acute osseous abnormalities are identified. Chronic right-sided rib deformities are noted. CT ABDOMEN AND PELVIS No free air or free fluid is seen within the abdomen or pelvis. There is no evidence of solid or hollow organ injury. There is a diffusely heterogeneous appearance to the liver, with multiple areas of decreased attenuation. Underlying mass cannot be excluded. The appearance raises question for hepatic cirrhosis and regenerative nodules. The gallbladder is within normal limits. The pancreas and adrenal glands are unremarkable. There is recanalization of the umbilical vein, extending to the vessels of the anterior abdominal wall. Scattered gastric and esophageal varices are seen. A tiny umbilical  hernia is noted, containing only fat. The kidneys are unremarkable in appearance. There is no evidence of hydronephrosis. No renal or ureteral stones are seen. Mild nonspecific perinephric stranding is noted bilaterally. No free fluid is identified. The small bowel is unremarkable in appearance. The stomach is within normal limits. No acute vascular abnormalities are seen. Mild scattered calcification is seen along the abdominal aorta and its branches. The appendix is not definitely characterized; there is no evidence of appendicitis. Scattered diverticulosis is noted along the descending and sigmoid colon, without evidence of diverticulitis. The bladder is decompressed, with a Foley catheter in place. The prostate is borderline prominent. No inguinal lymphadenopathy is seen. No acute osseous abnormalities are identified. 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 IMPRESSION: 1. No evidence of traumatic injury to the chest, abdomen or pelvis. 2. Changes of hepatic cirrhosis, with underlying diffuse heterogeneity of  the liver, concerning for degenerative nodules. Heterogeneity is more prominent than on the prior study. Underlying mass cannot be excluded. Dynamic liver protocol MRI or CT would be helpful for further evaluation. 3. Minimal bibasilar atelectasis noted.  Lungs otherwise clear. 4. Scattered coronary artery calcifications seen. 5. Recanalization of the umbilical vein, extending to the vessels of the anterior abdominal wall. Scattered gastric and esophageal varices noted. 6. Tiny umbilical hernia, containing only fat. 7. Mild scattered calcification along the abdominal aorta and its branches. 8. Scattered diverticulosis along the descending and sigmoid colon, without evidence of diverticulitis. 9. 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 Electronically Signed   By: Roanna Raider M.D.   On: 06/15/2015 02:47    CBC  Recent Labs Lab 06/14/15 2237 06/15/15 0621  WBC 8.0 7.0  HGB 13.2 11.7*  HCT  36.5* 32.6*  PLT 71* 60*  MCV 99.7 100.6*  MCH 36.1* 36.1*  MCHC 36.2* 35.9  RDW 15.0 15.4  LYMPHSABS 0.5*  --   MONOABS 1.2*  --   EOSABS 0.0  --   BASOSABS 0.0  --     Chemistries   Recent Labs Lab 06/14/15 2237 06/14/15 2330 06/15/15 0621  NA 130*  --  135  K 2.7*  --  3.0*  CL 92*  --  98*  CO2 21*  --  21*  GLUCOSE 119*  --  101*  BUN 18  --  14  CREATININE 1.10  --  1.03  CALCIUM 8.7*  --  8.0*  MG  --  1.9  --   AST 284*  --  274*  ALT 102*  --  92*  ALKPHOS 148*  --  122  BILITOT 13.6*  --  13.1*   ------------------------------------------------------------------------------------------------------------------ CrCl cannot be calculated (Unknown ideal weight.). ------------------------------------------------------------------------------------------------------------------ No results for input(s): HGBA1C in the last 72 hours. ------------------------------------------------------------------------------------------------------------------ No results for input(s): CHOL, HDL, LDLCALC, TRIG, CHOLHDL, LDLDIRECT in the last 72 hours. ------------------------------------------------------------------------------------------------------------------  Recent Labs  06/15/15 0621  TSH 0.916   ------------------------------------------------------------------------------------------------------------------ No results for input(s): VITAMINB12, FOLATE, FERRITIN, TIBC, IRON, RETICCTPCT in the last 72 hours.  Coagulation profile No results for input(s): INR, PROTIME in the last 168 hours.  No results for input(s): DDIMER in the last 72 hours.  Cardiac Enzymes  Recent Labs Lab 06/15/15 0621 06/15/15 1126  TROPONINI 0.10* 0.11*   ------------------------------------------------------------------------------------------------------------------ Invalid input(s): POCBNP  No results for input(s): GLUCAP in the last 72 hours.   RAI,RIPUDEEP M.D. [facility] 06/15/2015, 12:42 PM  Pager: [phone] Between 7am to 7pm - call Pager - [phone]  After 7pm go to [email or web address] - password [email or web address]  Call night coverage person covering after 7pm

## 2015-06-15 NOTE — Care Management Note (Signed)
Case Management Note  Patient Details  Name: Gary [name] MRN: [id number] Date of Birth: 03-19-1950  Subjective/Objective:           Admitted with s/p fall , confusion. Hx of  alcoholic cirrhosis, HTN. Pt is married, wife currently is in [place] and should return the end of April. Per sister, Gary [name]([phone] Gary [name], [phone] -C), neighbors have communicated pt has had multiple falls within the past ? Months. Pt uses a walker with ambulation. Independent with ADL's PTA. PCP: Zoe Lan.   Action/Plan: Return to home when medically stable. CM to f/u with disposition needs.  Expected Discharge Date:                  Expected Discharge Plan:  Home/Self Care  In-House Referral:  Clinical Social Work (etoh abuse)  Discharge planning Services  CM Consult  Post Acute Care Choice:    Choice offered to:     DME Arranged:    DME Agency:     HH Arranged:    HH Agency:     Status of Service:  In process, will continue to follow  Medicare Important Message Given:    Date Medicare IM Given:    Medicare IM give by:    Date Additional Medicare IM Given:    Additional Medicare Important Message give by:     If discussed at Long Length of Stay Meetings, dates discussed:    Additional Comments:  Gary [name], [place] [phone] 06/15/2015, 11:52 AM

## 2015-06-15 NOTE — Progress Notes (Signed)
Routine EEG completed, results pending. 

## 2015-06-15 NOTE — ED Notes (Signed)
Patient transported to X-ray 

## 2015-06-15 NOTE — Progress Notes (Signed)
UR COMPLETED  

## 2015-06-15 NOTE — H&P (Signed)
History and Physical  Patient Name: Gary [name]     WUJ:[id number]    DOB: [birth year]    DOA: 06/14/2015 Referring physician: Dereck Leep, MD PCP: [name], NP      Chief Complaint: Fall and confusion  HPI: Gary [name] is a 65 y.o. male with a past medical history significant for alcoholic cirrhosis, HTN who presents after unwitnessed fall, found down.  All history is collected from EMS via ED providers and chart review, as patient was brought to ER without family and he is unable to provide meaningful history.  Evidently, his wife is in [place] (he is able to sort of relate this), the patient was last seen about 3-4 days ago by neighbors, and then for some reason, a neighbor was asked to check on him today, and found him face down on the floor of his home, disoriented, weak.  EMS were called who found him awake, interactive, oriented only to person and place, slowly following commands, weak, dry mucous membranes, very bruised, jaundiced, incontinent of urine, and vitamin D the.  In the ED, he was tachycardic, afebrile, saturating well on ambient air.  Remained confused and jaundiced.  Na 130, K 2.7, Cr. 1.1, TBili 13, AST/ALT 280/100, WBC 8K, Hgb 13, platetelets 71K (at baseline).  CK 2000, mild troponin leak.  Cards thought troponin leak was expected given found down and degree of CK elevation.   CT imaging of the head, C-spine, chest abdomen and pelvis with contrast were unremarkable except for increased heterogeneity of the liver. Flat radiographs of both elbows were negative for fracture. Chest x-ray clear. Got 2L NS and ceftriaxone for UTI, and TRH were asked to evaluate for admission.       Review of Systems:  Pt complains of nothing, states "I feel great", but appears lethargic and disoriented. All other systems are reviewed but patient is an unreliable historian.  No Known Allergies  Prior to Admission medications   Medication Sig Start Date End Date Taking? Authorizing  Provider  acetaminophen (TYLENOL) 500 MG tablet Take 500 mg by mouth every 6 (six) hours as needed.    Historical Provider, MD  allopurinol (ZYLOPRIM) 100 MG tablet Take 1.5 tablets (150 mg total) by mouth daily. 04/26/14   Vassie Loll, MD  feeding supplement, ENSURE COMPLETE, (ENSURE COMPLETE) LIQD Take 237 mLs by mouth 2 (two) times daily between meals. Patient taking differently: Take 237 mLs by mouth 2 (two) times daily between meals. Pt uses Med [facility] 90 03/04/14   Rodolph Bong, MD  folic acid (FOLVITE) 1 MG tablet Take 1 mg by mouth daily.    Historical Provider, MD  furosemide (LASIX) 40 MG tablet Take 40 mg by mouth.    Historical Provider, MD  lactulose (CHRONULAC) 10 GM/15ML solution Take 45 mLs (30 g total) by mouth 3 (three) times daily. 11/13/14   Lonia Blood, MD  lisinopril (PRINIVIL,ZESTRIL) 20 MG tablet Take 1 tablet (20 mg total) by mouth daily. 04/26/14   Vassie Loll, MD  magnesium oxide (MAG-OX) 400 (241.3 MG) MG tablet Take 1 tablet (400 mg total) by mouth 2 (two) times daily. 11/13/14   Lonia Blood, MD  metoprolol tartrate (LOPRESSOR) 25 MG tablet Take 0.5 tablets (12.5 mg total) by mouth 2 (two) times daily. 04/26/14   Vassie Loll, MD  Multiple Vitamin (MULTIVITAMIN WITH MINERALS) TABS tablet Take 1 tablet by mouth daily. 03/04/14   Rodolph Bong, MD  Omega-3 Fatty Acids (FISH OIL PO)  Take 1 capsule by mouth daily.    Historical Provider, MD  oxyCODONE (OXY IR/ROXICODONE) 5 MG immediate release tablet Take 1 tablet (5 mg total) by mouth every 4 (four) hours as needed for moderate pain. 04/07/15   Luretha Murphy, MD  oxymetazoline (AFRIN) 0.05 % nasal spray Place 1 spray into both nostrils 2 (two) times daily. 04/07/15   Luretha Murphy, MD  pantoprazole (PROTONIX) 40 MG tablet Take 1 tablet (40 mg total) by mouth daily. 04/26/14   Vassie Loll, MD  potassium chloride SA (K-DUR,KLOR-CON) 20 MEQ tablet Take 1 tablet (20 mEq total) by mouth 2 (two) times daily. Take for  4 days then stop. 11/13/14   Lonia Blood, MD  thiamine 100 MG tablet Take 1 tablet (100 mg total) by mouth daily. Patient not taking: Reported on 04/05/2015 11/13/14   Lonia Blood, MD  Vitamin D, Ergocalciferol, (DRISDOL) 50000 UNITS CAPS capsule Take 50,000 Units by mouth every 7 (seven) days.     Historical Provider, MD    Past Medical History  Diagnosis Date  . Hypertension   . Arthritis   . Alcohol abuse   . CHF (congestive heart failure) (HCC)   . Elevated liver enzymes   . Alcoholic cirrhosis of liver without ascites (HCC)   . Thrombocytopenia (HCC)   . Acute hepatitis   . Vitamin D deficiency   . Hypercholesterolemia   . Macrocytic anemia   . Tremors of nervous system     Past Surgical History  Procedure Laterality Date  . Tonsillectomy    . Wisdom tooth extraction      Family history: family history includes Alcohol abuse in his father; COPD in his mother; Heart attack in his father; Heart failure in his father. There is no history of Colon cancer, Colon polyps, Diabetes, Kidney disease, Gallbladder disease, or Esophageal cancer.  Social History: Patient lives with wife, who is in [place].  He previously  reports that he has never smoked. He has never used smokeless tobacco. He reports that he drinks about 4.8 oz of alcohol per week. He reports that he does not use illicit drugs.     Physical Exam: BP 121/88 mmHg  Pulse 98  Temp(Src) 98.1 F (36.7 C) (Oral)  Resp 20  SpO2 100% General appearance: Well-developed, adult male, lethargic, oriented to "[facility]" but not year or events leading to hospitalization.  Says his wife came back from [place] "months ago".   Eyes: Icteric, lids swollen and scant eye discharge.  Diffuse swelling around eyes and bruising.   Head: Abrasion and crusting on left scalp.     ENT: No nasal discharge, or epistaxis.  Mucus membranes dry.   Skin: Jaundiced.  Numerous bruises. Cardiac: Tachycardic, regular, nl S1-S2, no murmurs  appreciated.  Capillary refill is sluggish.  Non-pitting LE edema.  Radial pulses weak but symmetric. Respiratory: Normal respiratory rate and rhythm.  No rales or wheezes appreciated. Abdomen: Abdomen soft without rigidity.  Umbilical hernia, non-tender. Belly seems distended, soft, no TTP or guarding. No tense ascites.  Tremor, asterixis? MSK: Ecchymosis diffusely.  No deformed limbs.  The left hand appears weak but he can move it. Neuro: Oriented to place and self.  Otherwise disoriented to date, year, and sistuation.  Responds sluggishly to questions.  Very weak, globally, left hand more weak.  But able to raise both arms and leg tone equal.     Psych: Lethargic behavior.  Affect blunted.  No evidence of aural or visual hallucinations or  delusions.       Labs on Admission:  The metabolic panel shows hyponatremia, hypokalemia, normal renal function. Mildly elevated transaminases. Total bilirubin 13 mg/dL. Urinalysis with nitrites and casts. CK >2000 U/L. Troponin 0.12 ng/mL The complete blood count shows chronic thrombocytopenia.   Radiological Exams on Admission: Personally reviewed: Dg Chest 1 View  06/15/2015  CLINICAL DATA:  Found down at home. EXAM: CHEST 1 VIEW COMPARISON:  Chest radiograph April 05, 2015 FINDINGS: Cardiac silhouette is mildly enlarged, mediastinal silhouette is unremarkable for this low inspiratory portable examination with crowded vasculature markings. The lungs are clear without pleural effusions or focal consolidations. Bandlike density LEFT lung base. Stable LEFT apical pleural thickening. Trachea projects midline and there is no pneumothorax. Included soft tissue planes and osseous structures are non-suspicious. Old RIGHT posterior rib fractures. Old RIGHT mid clavicle fracture. Osteopenia. IMPRESSION: Mild cardiomegaly and LEFT lung base atelectasis. Electronically Signed   By: Awilda Metro M.D.   On: 06/15/2015 01:40   Dg Elbow Complete  Left  06/15/2015  CLINICAL DATA:  Larey Seat at home. EXAM: LEFT ELBOW - COMPLETE 3+ VIEW COMPARISON:  None. FINDINGS: There is no evidence of fracture, dislocation, or joint effusion. There is no evidence of arthropathy or other focal bone abnormality. Soft tissues are unremarkable. IMPRESSION: Negative. Electronically Signed   By: Ellery Plunk M.D.   On: 06/15/2015 01:42   Dg Elbow Complete Right  06/15/2015  CLINICAL DATA:  Larey Seat at home EXAM: RIGHT ELBOW - COMPLETE 3+ VIEW COMPARISON:  None. FINDINGS: There is no evidence of fracture, dislocation, or joint effusion. There is no evidence of arthropathy or other focal bone abnormality. Soft tissues are unremarkable. IMPRESSION: Negative. Electronically Signed   By: Ellery Plunk M.D.   On: 06/15/2015 01:42   Ct Head Wo Contrast  06/15/2015  CLINICAL DATA:  Found down, with bruising about the head. Concern for head or cervical spine injury. Initial encounter. EXAM: CT HEAD WITHOUT CONTRAST CT CERVICAL SPINE WITHOUT CONTRAST TECHNIQUE: Multidetector CT imaging of the head and cervical spine was performed following the standard protocol without intravenous contrast. Multiplanar CT image reconstructions of the cervical spine were also generated. COMPARISON:  None. FINDINGS: CT HEAD FINDINGS There is no evidence of acute infarction, mass lesion, or intra- or extra-axial hemorrhage on CT. Prominence of the ventricles and sulci reflects mild cortical volume loss. Mild cerebellar atrophy is noted. Mild periventricular and subcortical white matter change likely reflects small vessel ischemic microangiopathy. The brainstem and fourth ventricle are within normal limits. The basal ganglia are unremarkable in appearance. The cerebral hemispheres demonstrate grossly normal gray-white differentiation. No mass effect or midline shift is seen. There is no evidence of fracture; visualized osseous structures are unremarkable in appearance. The visualized portions of the  orbits are within normal limits. Mucus retention cyst or polyp is noted at the left maxillary sinus. The remaining paranasal sinuses and mastoid air cells are well-aerated. Soft tissue swelling is noted overlying the left parietal calvarium and at the left vertex. CT CERVICAL SPINE FINDINGS There is no evidence of fracture or subluxation. Vertebral bodies demonstrate normal height and alignment. Multilevel disc space narrowing is noted along the lower cervical spine, with small anterior and posterior disc osteophyte complexes. Prevertebral soft tissues are within normal limits. The visualized neural foramina are grossly unremarkable. The thyroid gland is unremarkable in appearance. No significant soft tissue abnormalities are seen. IMPRESSION: 1. No evidence of traumatic intracranial injury or fracture. 2. No evidence of fracture or subluxation along  the cervical spine. 3. Soft tissue swelling overlying the left parietal calvarium and at the left vertex. 4. Mild cortical volume loss and scattered small vessel ischemic microangiopathy. 5. Mild degenerative change along the lower cervical spine. 6. Mucus retention cyst or polyp at the left maxillary sinus. Electronically Signed   By: Roanna Raider M.D.   On: 06/15/2015 02:37   Ct Chest W Contrast  06/15/2015  CLINICAL DATA:  Found down with chest and abdominal bruising. Initial encounter. EXAM: CT CHEST, ABDOMEN, AND PELVIS WITH CONTRAST TECHNIQUE: Multidetector CT imaging of the chest, abdomen and pelvis was performed following the standard protocol during bolus administration of intravenous contrast. CONTRAST:  100 mL ISOVUE-300 IOPAMIDOL (ISOVUE-300) INJECTION 61% COMPARISON:  CT of the chest, abdomen and pelvis performed 04/05/2015 FINDINGS: CT CHEST Minimal bibasilar atelectasis is noted. The lungs are otherwise clear. No pleural effusion or pneumothorax is seen. No masses are identified. There is no evidence of pulmonary parenchymal contusion. Scattered  coronary artery calcifications are seen. No pericardial effusion is identified. No mediastinal lymphadenopathy is seen. There is no evidence of venous hemorrhage. The great vessels are grossly unremarkable in appearance. Mild calcification is seen along the aortic arch. The visualized portions of thyroid gland are unremarkable. No axillary lymphadenopathy is seen. There is no evidence of significant soft tissue injury along the chest wall. No acute osseous abnormalities are identified. Chronic right-sided rib deformities are noted. CT ABDOMEN AND PELVIS No free air or free fluid is seen within the abdomen or pelvis. There is no evidence of solid or hollow organ injury. There is a diffusely heterogeneous appearance to the liver, with multiple areas of decreased attenuation. Underlying mass cannot be excluded. The appearance raises question for hepatic cirrhosis and regenerative nodules. The gallbladder is within normal limits. The pancreas and adrenal glands are unremarkable. There is recanalization of the umbilical vein, extending to the vessels of the anterior abdominal wall. Scattered gastric and esophageal varices are seen. A tiny umbilical hernia is noted, containing only fat. The kidneys are unremarkable in appearance. There is no evidence of hydronephrosis. No renal or ureteral stones are seen. Mild nonspecific perinephric stranding is noted bilaterally. No free fluid is identified. The small bowel is unremarkable in appearance. The stomach is within normal limits. No acute vascular abnormalities are seen. Mild scattered calcification is seen along the abdominal aorta and its branches. The appendix is not definitely characterized; there is no evidence of appendicitis. Scattered diverticulosis is noted along the descending and sigmoid colon, without evidence of diverticulitis. The bladder is decompressed, with a Foley catheter in place. The prostate is borderline prominent. No inguinal lymphadenopathy is seen.  No acute osseous abnormalities are identified. 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 IMPRESSION: 1. No evidence of traumatic injury to the chest, abdomen or pelvis. 2. Changes of hepatic cirrhosis, with underlying diffuse heterogeneity of the liver, concerning for degenerative nodules. Heterogeneity is more prominent than on the prior study. Underlying mass cannot be excluded. Dynamic liver protocol MRI or CT would be helpful for further evaluation. 3. Minimal bibasilar atelectasis noted.  Lungs otherwise clear. 4. Scattered coronary artery calcifications seen. 5. Recanalization of the umbilical vein, extending to the vessels of the anterior abdominal wall. Scattered gastric and esophageal varices noted. 6. Tiny umbilical hernia, containing only fat. 7. Mild scattered calcification along the abdominal aorta and its branches. 8. Scattered diverticulosis along the descending and sigmoid colon, without evidence of diverticulitis. 9. 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 Electronically Signed  By: Roanna Raider M.D.   On: 06/15/2015 02:47   Ct Cervical Spine Wo Contrast  06/15/2015  CLINICAL DATA:  Found down, with bruising about the head. Concern for head or cervical spine injury. Initial encounter. EXAM: CT HEAD WITHOUT CONTRAST CT CERVICAL SPINE WITHOUT CONTRAST TECHNIQUE: Multidetector CT imaging of the head and cervical spine was performed following the standard protocol without intravenous contrast. Multiplanar CT image reconstructions of the cervical spine were also generated. COMPARISON:  None. FINDINGS: CT HEAD FINDINGS There is no evidence of acute infarction, mass lesion, or intra- or extra-axial hemorrhage on CT. Prominence of the ventricles and sulci reflects mild cortical volume loss. Mild cerebellar atrophy is noted. Mild periventricular and subcortical white matter change likely reflects small vessel ischemic microangiopathy. The brainstem and fourth ventricle are within normal limits.  The basal ganglia are unremarkable in appearance. The cerebral hemispheres demonstrate grossly normal gray-white differentiation. No mass effect or midline shift is seen. There is no evidence of fracture; visualized osseous structures are unremarkable in appearance. The visualized portions of the orbits are within normal limits. Mucus retention cyst or polyp is noted at the left maxillary sinus. The remaining paranasal sinuses and mastoid air cells are well-aerated. Soft tissue swelling is noted overlying the left parietal calvarium and at the left vertex. CT CERVICAL SPINE FINDINGS There is no evidence of fracture or subluxation. Vertebral bodies demonstrate normal height and alignment. Multilevel disc space narrowing is noted along the lower cervical spine, with small anterior and posterior disc osteophyte complexes. Prevertebral soft tissues are within normal limits. The visualized neural foramina are grossly unremarkable. The thyroid gland is unremarkable in appearance. No significant soft tissue abnormalities are seen. IMPRESSION: 1. No evidence of traumatic intracranial injury or fracture. 2. No evidence of fracture or subluxation along the cervical spine. 3. Soft tissue swelling overlying the left parietal calvarium and at the left vertex. 4. Mild cortical volume loss and scattered small vessel ischemic microangiopathy. 5. Mild degenerative change along the lower cervical spine. 6. Mucus retention cyst or polyp at the left maxillary sinus. Electronically Signed   By: Roanna Raider M.D.   On: 06/15/2015 02:37   Ct Abdomen Pelvis W Contrast  06/15/2015  CLINICAL DATA:  Found down with chest and abdominal bruising. Initial encounter. EXAM: CT CHEST, ABDOMEN, AND PELVIS WITH CONTRAST TECHNIQUE: Multidetector CT imaging of the chest, abdomen and pelvis was performed following the standard protocol during bolus administration of intravenous contrast. CONTRAST:  100 mL ISOVUE-300 IOPAMIDOL (ISOVUE-300) INJECTION  61% COMPARISON:  CT of the chest, abdomen and pelvis performed 04/05/2015 FINDINGS: CT CHEST Minimal bibasilar atelectasis is noted. The lungs are otherwise clear. No pleural effusion or pneumothorax is seen. No masses are identified. There is no evidence of pulmonary parenchymal contusion. Scattered coronary artery calcifications are seen. No pericardial effusion is identified. No mediastinal lymphadenopathy is seen. There is no evidence of venous hemorrhage. The great vessels are grossly unremarkable in appearance. Mild calcification is seen along the aortic arch. The visualized portions of thyroid gland are unremarkable. No axillary lymphadenopathy is seen. There is no evidence of significant soft tissue injury along the chest wall. No acute osseous abnormalities are identified. Chronic right-sided rib deformities are noted. CT ABDOMEN AND PELVIS No free air or free fluid is seen within the abdomen or pelvis. There is no evidence of solid or hollow organ injury. There is a diffusely heterogeneous appearance to the liver, with multiple areas of decreased attenuation. Underlying mass cannot be excluded. The  appearance raises question for hepatic cirrhosis and regenerative nodules. The gallbladder is within normal limits. The pancreas and adrenal glands are unremarkable. There is recanalization of the umbilical vein, extending to the vessels of the anterior abdominal wall. Scattered gastric and esophageal varices are seen. A tiny umbilical hernia is noted, containing only fat. The kidneys are unremarkable in appearance. There is no evidence of hydronephrosis. No renal or ureteral stones are seen. Mild nonspecific perinephric stranding is noted bilaterally. No free fluid is identified. The small bowel is unremarkable in appearance. The stomach is within normal limits. No acute vascular abnormalities are seen. Mild scattered calcification is seen along the abdominal aorta and its branches. The appendix is not  definitely characterized; there is no evidence of appendicitis. Scattered diverticulosis is noted along the descending and sigmoid colon, without evidence of diverticulitis. The bladder is decompressed, with a Foley catheter in place. The prostate is borderline prominent. No inguinal lymphadenopathy is seen. No acute osseous abnormalities are identified. 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 IMPRESSION: 1. No evidence of traumatic injury to the chest, abdomen or pelvis. 2. Changes of hepatic cirrhosis, with underlying diffuse heterogeneity of the liver, concerning for degenerative nodules. Heterogeneity is more prominent than on the prior study. Underlying mass cannot be excluded. Dynamic liver protocol MRI or CT would be helpful for further evaluation. 3. Minimal bibasilar atelectasis noted.  Lungs otherwise clear. 4. Scattered coronary artery calcifications seen. 5. Recanalization of the umbilical vein, extending to the vessels of the anterior abdominal wall. Scattered gastric and esophageal varices noted. 6. Tiny umbilical hernia, containing only fat. 7. Mild scattered calcification along the abdominal aorta and its branches. 8. Scattered diverticulosis along the descending and sigmoid colon, without evidence of diverticulitis. 9. 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 Electronically Signed   By: Roanna Raider M.D.   On: 06/15/2015 02:47    EKG: Independently reviewed. Rate 90, sinus.  QTc 491, high normal.  No ischemic changes.    Assessment/Plan 1. Altered mental status:  This is new.  Cause unclear.  Patient with hyponatremia and hypokalemia.  Also history of hepatic encephalopathy with cirrhosis.  No focal neurologic findings to me and to EDP.  Possible UTI.    -Trend troponin -Trend CK -Check ammonia and lactic acid and TSH -IVF -If patient's left hand weakness does not improve with fluid hydration, will order MRI brain to rule out infarction as cause of fall -Continue  lactulose   2. Hyperbilirubinemia:  This is new.  Unclear etiology. -Trend CMP  3. Cirrhosis:  -Continue lactulose -Continue home thiamine -Hold furosemide for now  4. HTN:  Normotensive at admission. -Hold furosemide, lisinopril, metoprolol for now  5. Elevated CK:  -IVF at 250 cc/hr  -Trend BMP  -Trend CK q12 hrs  6. Thrombocytopenia:  Caused by #3.  Stable from previous.  7. Hypokalemia:  -Supplemented in ER -Continue fluids with K for 8 hours, repeat BMP tomorrow -Continue oral supplement from home  8. Hyponatremia: Probably hypervolemic, but appears clinically very dry and so will hydrate first. -Trend BMP -Check free water clearance  9. UTI: Nitrites and bacteria in urine, unclear cause. -Empiric ceftriaxone for now   DVT PPx: SCDs Diet: Regular Consultants: GI Code Status: FULL Family Communication: None present  Medical decision making: What exists of the patient's previous chart was reviewed in depth and the case was discussed with Dr. Mora Bellman. Patient seen 4:50 AM on 06/15/2015.  Disposition Plan:  I recommend admission to telemetry,  inpatient status.  Clinical condition: stable.  Anticipate fluid resuscitation, continue laboratory evaluation.  Consultation with GI for hepatic cirrhosis.  Disposition pending patient course.      Alberteen Sam [facility] Pager [phone]

## 2015-06-15 NOTE — Progress Notes (Signed)
Patient's heart rate 135-140's. Asymptomatic no complaints of pain or discomfort. Vital signs stable. Notified MD of increased heart rate. Dr. Isidoro Donning gave verbal orders to give one time dose of Lopressor I.V 5mg . Orders entered and read back.

## 2015-06-15 NOTE — Progress Notes (Signed)
Paged admitting alerting them to patient's arrival.  Admitting called.  Dr is aware. They are running behind. Dorothe Pea, RN

## 2015-06-16 ENCOUNTER — Inpatient Hospital Stay (HOSPITAL_COMMUNITY): Payer: Medicare Other

## 2015-06-16 DIAGNOSIS — K7010 Alcoholic hepatitis without ascites: Secondary | ICD-10-CM

## 2015-06-16 DIAGNOSIS — R55 Syncope and collapse: Secondary | ICD-10-CM

## 2015-06-16 DIAGNOSIS — W19XXXA Unspecified fall, initial encounter: Secondary | ICD-10-CM

## 2015-06-16 DIAGNOSIS — K7030 Alcoholic cirrhosis of liver without ascites: Secondary | ICD-10-CM

## 2015-06-16 DIAGNOSIS — K766 Portal hypertension: Secondary | ICD-10-CM

## 2015-06-16 LAB — AMMONIA: Ammonia: 72 umol/L — ABNORMAL HIGH (ref 9–35)

## 2015-06-16 LAB — CK
CK TOTAL: 1066 U/L — AB (ref 49–397)
Total CK: 1099 U/L — ABNORMAL HIGH (ref 49–397)

## 2015-06-16 LAB — COMPREHENSIVE METABOLIC PANEL
ALK PHOS: 126 U/L (ref 38–126)
ALT: 85 U/L — ABNORMAL HIGH (ref 17–63)
ANION GAP: 10 (ref 5–15)
AST: 247 U/L — ABNORMAL HIGH (ref 15–41)
Albumin: 2.2 g/dL — ABNORMAL LOW (ref 3.5–5.0)
BILIRUBIN TOTAL: 13.1 mg/dL — AB (ref 0.3–1.2)
BUN: 11 mg/dL (ref 6–20)
CALCIUM: 8 mg/dL — AB (ref 8.9–10.3)
CO2: 23 mmol/L (ref 22–32)
Chloride: 102 mmol/L (ref 101–111)
Creatinine, Ser: 0.74 mg/dL (ref 0.61–1.24)
GLUCOSE: 103 mg/dL — AB (ref 65–99)
Potassium: 2.7 mmol/L — CL (ref 3.5–5.1)
Sodium: 135 mmol/L (ref 135–145)
TOTAL PROTEIN: 4.8 g/dL — AB (ref 6.5–8.1)

## 2015-06-16 LAB — CBC
HEMATOCRIT: 33.6 % — AB (ref 39.0–52.0)
HEMOGLOBIN: 11.6 g/dL — AB (ref 13.0–17.0)
MCH: 34.3 pg — AB (ref 26.0–34.0)
MCHC: 34.5 g/dL (ref 30.0–36.0)
MCV: 99.4 fL (ref 78.0–100.0)
Platelets: 64 10*3/uL — ABNORMAL LOW (ref 150–400)
RBC: 3.38 MIL/uL — ABNORMAL LOW (ref 4.22–5.81)
RDW: 16.2 % — ABNORMAL HIGH (ref 11.5–15.5)
WBC: 6 10*3/uL (ref 4.0–10.5)

## 2015-06-16 LAB — URINE CULTURE: CULTURE: NO GROWTH

## 2015-06-16 LAB — PROTIME-INR
INR: 2.19 — ABNORMAL HIGH (ref 0.00–1.49)
Prothrombin Time: 24.2 seconds — ABNORMAL HIGH (ref 11.6–15.2)

## 2015-06-16 LAB — MAGNESIUM: MAGNESIUM: 1.5 mg/dL — AB (ref 1.7–2.4)

## 2015-06-16 LAB — POTASSIUM: Potassium: 3.1 mmol/L — ABNORMAL LOW (ref 3.5–5.1)

## 2015-06-16 LAB — ECHOCARDIOGRAM COMPLETE: Height: 70 in

## 2015-06-16 MED ORDER — PREDNISOLONE 5 MG PO TABS
40.0000 mg | ORAL_TABLET | Freq: Every day | ORAL | Status: DC
  Administered 2015-06-16 – 2015-06-20 (×4): 40 mg via ORAL
  Filled 2015-06-16 (×5): qty 8

## 2015-06-16 MED ORDER — MAGNESIUM SULFATE 50 % IJ SOLN
3.0000 g | Freq: Once | INTRAVENOUS | Status: AC
  Administered 2015-06-16: 3 g via INTRAVENOUS
  Filled 2015-06-16: qty 6

## 2015-06-16 MED ORDER — POTASSIUM CHLORIDE 10 MEQ/100ML IV SOLN
10.0000 meq | INTRAVENOUS | Status: AC
  Administered 2015-06-16 (×4): 10 meq via INTRAVENOUS
  Filled 2015-06-16 (×4): qty 100

## 2015-06-16 MED ORDER — PREDNISONE 20 MG PO TABS: 40.0000 mg | ORAL_TABLET | Freq: Every day | ORAL | Status: DC

## 2015-06-16 MED ORDER — METOPROLOL TARTRATE 12.5 MG HALF TABLET
12.5000 mg | ORAL_TABLET | Freq: Two times a day (BID) | ORAL | Status: DC
  Administered 2015-06-16 – 2015-06-19 (×7): 12.5 mg via ORAL
  Filled 2015-06-16 (×8): qty 1

## 2015-06-16 MED ORDER — POTASSIUM CHLORIDE CRYS ER 20 MEQ PO TBCR
40.0000 meq | EXTENDED_RELEASE_TABLET | ORAL | Status: AC
  Administered 2015-06-16 (×2): 40 meq via ORAL
  Filled 2015-06-16 (×2): qty 2

## 2015-06-16 NOTE — Progress Notes (Signed)
[facility]                                                                              Patient Demographics  Gary [name], is a 65 y.o. male, DOB - [birth year], ZOX:[id number]  Admit date - 06/14/2015   Admitting Physician Alberteen Sam, MD  Outpatient Primary MD for the patient is [name], NP  LOS - 1  days    Chief Complaint  Patient presents with  . Altered Mental Status       Brief HPI  Gary [name] is a 65 y.o. male with a past medical history significant for alcoholic cirrhosis, HTN who presented after unwitnessed fall, found down. Evidently, his wife is in [place] (he is able to sort of relate this), the patient was last seen about 3-4 days ago by neighbors. His sister had called her neighbor to check on him and patient was found face down on the floor of his home, disoriented, weak. EMS were called who found him awake, interactive, oriented only to person and place, slowly following commands, weak, dry mucous membranes, very bruised, jaundiced, incontinent of urine. In the ED, he was tachycardic, afebrile, saturating well on ambient air. Remained confused and jaundiced. Na 130, K 2.7, Cr. 1.1, TBili 13, AST/ALT 280/100, WBC 8K, Hgb 13, platetelets 71K (at baseline). CK 2000, mild troponin leak. Cards thought troponin leak was expected given found down and degree of CK elevation. CT imaging of the head, C-spine, chest abdomen and pelvis with contrast were unremarkable except for increased heterogeneity of the liver. Flat radiographs of both elbows were negative for fracture. Chest x-ray clear. Got 2L NS and ceftriaxone for UTI, and TRH were asked to evaluate for admission.     Assessment & Plan   Altered mental status: Improving today, patient alert and awake, oriented to self and place, reported that he had fallen at home as he tripped over his dog and was not able to get up. - Multifactorial, possibility of hepatic encephalopathy with  cirrhosis, UTI, multiple metabolic abnormalities including hyponatremia, hypokalemia, rhabdomyolysis, ? EtOH withdrawal - No focal neurological deficits noted, CT head negative, MRI brain negative, EEG results pending -  Ammonia level was elevated at 92, placed on lactulose enema, changed to oral lactulose as patient is more alert and awake today - TSH normal - UA positive for UTI, follow urine cultures, continue IV Rocephin - MCV 100.6, check B12, folate   Hyperbilirubinemia, jaundice: Likely due to liver cirrhosis, has macrocytic anemia, thrombocytopenia, hepatic encephalopathy/hyperammonemia - CT abdomen showed hepatic cirrhosis with degenerative nodules - Ultrasound showed severe hepatic steatosis, trace amount of perisplenic ascites - GI consulted, d/w Dr Rhea Belton   Essential hypertension, tachycardia - Normotensive at admission. -Restart beta blocker due to tachycardia, possibly beta blocker withdrawals, EtOH withdrawal - Follow 2-D echo  History of alcohol abuse -  Continue CIWA protocol with ativan   Rhabdomyolysis: Likely due to immobility, found down -Continue IV fluid hydration, trend his CKs   Thrombocytopenia: Due to liver cirrhosis Caused by #3. Stable from previous.  Hypokalemia, hypomagnesemia:  -Replaced  Hyponatremia: Improving likely due to hypovolemia and  dehydration   UTI: - follow urine culture, continue IV Rocephin  Elevated troponin - Possible troponin leak due to acute rhabdomyolysis - Follow 2-D echo to rule out wall motion abnormalities, EF   Code Status: Full code   Family Communication: No family member at the bedside  Disposition Plan:   Time Spent in minutes   25 minutes  Procedures  CT abd, head MRI brain  Consults  G I  DVT Prophylaxis  SCD's  Medications  Scheduled Meds: . cefTRIAXone (ROCEPHIN)  IV  1 g Intravenous Q24H  . folic acid  1 mg Oral Daily  . lactulose  30 g Oral TID  . magnesium sulfate LVP 250-500 ml  3 g  Intravenous Once  . metoprolol tartrate  12.5 mg Oral BID  . multivitamin with minerals  1 tablet Oral Daily  . pantoprazole  40 mg Oral Daily  . potassium chloride  10 mEq Intravenous Q1 Hr x 4  . potassium chloride  40 mEq Oral Q4H  . sodium chloride flush  3 mL Intravenous Q12H  . thiamine  100 mg Oral Daily   Continuous Infusions:   PRN Meds:.LORazepam **OR** LORazepam, oxyCODONE, senna-docusate   Antibiotics   Anti-infectives    Start     Dose/Rate Route Frequency Ordered Stop   06/15/15 0530  cefTRIAXone (ROCEPHIN) 1 g in dextrose 5 % 50 mL IVPB     1 g 100 mL/hr over 30 Minutes Intravenous Every 24 hours 06/15/15 0517 06/22/15 0529        Subjective:   Gary [name] was seen and examined today. March more alert and awake today, oriented to self and place, states he is in the hospital. He had fallen at home and was not able to get up. States his wife is at home. However he was found by the neighbor alone, likely he has still confusion going on. Difficult to obtain review of system. Jaundiced, no fevers. Tachycardia noted, nonsustained  Objective:   Filed Vitals:   06/15/15 2203 06/16/15 0610 06/16/15 0755 06/16/15 1149  BP: 129/87 116/73 107/78   Pulse: 87 91 143   Temp: 99.7 F (37.6 C) 98.6 F (37 C)    TempSrc: Oral     Resp: 18 18 17    Height:      Weight:    98.1 kg (216 lb 4.3 oz)  SpO2: 99% 95% 95%     Intake/Output Summary (Last 24 hours) at 06/16/15 1239 Last data filed at 06/16/15 1053  Gross per 24 hour  Intake 756.25 ml  Output   1002 ml  Net -245.75 ml     Wt Readings from Last 3 Encounters:  06/16/15 98.1 kg (216 lb 4.3 oz)  04/05/15 83.915 kg (185 lb)  11/21/14 82.158 kg (181 lb 2 oz)     Exam  General:Alert and oriented 2, NAD, jaundiced  HEENT:  PERRLA, EOMI, icteric Sclera, mucous membranes moist.   Neck: Supple, no JVD  CVS: S1 S2 auscultated, no rubs, murmurs or gallops. Regular rate and rhythm.  Respiratory: Clear to  auscultation bilaterally, no wheezing, rales or rhonchi  Abdomen: Soft, nontender, nondistended, + bowel sounds  Ext: no cyanosis clubbing or edema, tremulous   Neuro:strength 5/5 in upper and lower extremities bilaterally   Skin: No rashes  Psych:mental status better today  Data Reviewed:  I have personally reviewed following labs and imaging studies  Micro Results No results found for this or any previous visit (from the past 240 hour(s)).  Radiology Reports Dg Chest 1 View  06/15/2015  CLINICAL DATA:  Found down at home. EXAM: CHEST 1 VIEW COMPARISON:  Chest radiograph April 05, 2015 FINDINGS: Cardiac silhouette is mildly enlarged, mediastinal silhouette is unremarkable for this low inspiratory portable examination with crowded vasculature markings. The lungs are clear without pleural effusions or focal consolidations. Bandlike density LEFT lung base. Stable LEFT apical pleural thickening. Trachea projects midline and there is no pneumothorax. Included soft tissue planes and osseous structures are non-suspicious. Old RIGHT posterior rib fractures. Old RIGHT mid clavicle fracture. Osteopenia. IMPRESSION: Mild cardiomegaly and LEFT lung base atelectasis. Electronically Signed   By: Awilda Metro M.D.   On: 06/15/2015 01:40   Dg Elbow Complete Left  06/15/2015  CLINICAL DATA:  Larey Seat at home. EXAM: LEFT ELBOW - COMPLETE 3+ VIEW COMPARISON:  None. FINDINGS: There is no evidence of fracture, dislocation, or joint effusion. There is no evidence of arthropathy or other focal bone abnormality. Soft tissues are unremarkable. IMPRESSION: Negative. Electronically Signed   By: Ellery Plunk M.D.   On: 06/15/2015 01:42   Dg Elbow Complete Right  06/15/2015  CLINICAL DATA:  Larey Seat at home EXAM: RIGHT ELBOW - COMPLETE 3+ VIEW COMPARISON:  None. FINDINGS: There is no evidence of fracture, dislocation, or joint effusion. There is no evidence of arthropathy or other focal bone abnormality. Soft  tissues are unremarkable. IMPRESSION: Negative. Electronically Signed   By: Ellery Plunk M.D.   On: 06/15/2015 01:42   Ct Head Wo Contrast  06/15/2015  CLINICAL DATA:  Found down, with bruising about the head. Concern for head or cervical spine injury. Initial encounter. EXAM: CT HEAD WITHOUT CONTRAST CT CERVICAL SPINE WITHOUT CONTRAST TECHNIQUE: Multidetector CT imaging of the head and cervical spine was performed following the standard protocol without intravenous contrast. Multiplanar CT image reconstructions of the cervical spine were also generated. COMPARISON:  None. FINDINGS: CT HEAD FINDINGS There is no evidence of acute infarction, mass lesion, or intra- or extra-axial hemorrhage on CT. Prominence of the ventricles and sulci reflects mild cortical volume loss. Mild cerebellar atrophy is noted. Mild periventricular and subcortical white matter change likely reflects small vessel ischemic microangiopathy. The brainstem and fourth ventricle are within normal limits. The basal ganglia are unremarkable in appearance. The cerebral hemispheres demonstrate grossly normal gray-white differentiation. No mass effect or midline shift is seen. There is no evidence of fracture; visualized osseous structures are unremarkable in appearance. The visualized portions of the orbits are within normal limits. Mucus retention cyst or polyp is noted at the left maxillary sinus. The remaining paranasal sinuses and mastoid air cells are well-aerated. Soft tissue swelling is noted overlying the left parietal calvarium and at the left vertex. CT CERVICAL SPINE FINDINGS There is no evidence of fracture or subluxation. Vertebral bodies demonstrate normal height and alignment. Multilevel disc space narrowing is noted along the lower cervical spine, with small anterior and posterior disc osteophyte complexes. Prevertebral soft tissues are within normal limits. The visualized neural foramina are grossly unremarkable. The thyroid  gland is unremarkable in appearance. No significant soft tissue abnormalities are seen. IMPRESSION: 1. No evidence of traumatic intracranial injury or fracture. 2. No evidence of fracture or subluxation along the cervical spine. 3. Soft tissue swelling overlying the left parietal calvarium and at the left vertex. 4. Mild cortical volume loss and scattered small vessel ischemic microangiopathy. 5. Mild degenerative change along the lower cervical spine. 6. Mucus retention cyst or polyp at the left maxillary sinus. Electronically Signed  By: Roanna Raider M.D.   On: 06/15/2015 02:37   Ct Chest W Contrast  06/15/2015  CLINICAL DATA:  Found down with chest and abdominal bruising. Initial encounter. EXAM: CT CHEST, ABDOMEN, AND PELVIS WITH CONTRAST TECHNIQUE: Multidetector CT imaging of the chest, abdomen and pelvis was performed following the standard protocol during bolus administration of intravenous contrast. CONTRAST:  100 mL ISOVUE-300 IOPAMIDOL (ISOVUE-300) INJECTION 61% COMPARISON:  CT of the chest, abdomen and pelvis performed 04/05/2015 FINDINGS: CT CHEST Minimal bibasilar atelectasis is noted. The lungs are otherwise clear. No pleural effusion or pneumothorax is seen. No masses are identified. There is no evidence of pulmonary parenchymal contusion. Scattered coronary artery calcifications are seen. No pericardial effusion is identified. No mediastinal lymphadenopathy is seen. There is no evidence of venous hemorrhage. The great vessels are grossly unremarkable in appearance. Mild calcification is seen along the aortic arch. The visualized portions of thyroid gland are unremarkable. No axillary lymphadenopathy is seen. There is no evidence of significant soft tissue injury along the chest wall. No acute osseous abnormalities are identified. Chronic right-sided rib deformities are noted. CT ABDOMEN AND PELVIS No free air or free fluid is seen within the abdomen or pelvis. There is no evidence of solid or  hollow organ injury. There is a diffusely heterogeneous appearance to the liver, with multiple areas of decreased attenuation. Underlying mass cannot be excluded. The appearance raises question for hepatic cirrhosis and regenerative nodules. The gallbladder is within normal limits. The pancreas and adrenal glands are unremarkable. There is recanalization of the umbilical vein, extending to the vessels of the anterior abdominal wall. Scattered gastric and esophageal varices are seen. A tiny umbilical hernia is noted, containing only fat. The kidneys are unremarkable in appearance. There is no evidence of hydronephrosis. No renal or ureteral stones are seen. Mild nonspecific perinephric stranding is noted bilaterally. No free fluid is identified. The small bowel is unremarkable in appearance. The stomach is within normal limits. No acute vascular abnormalities are seen. Mild scattered calcification is seen along the abdominal aorta and its branches. The appendix is not definitely characterized; there is no evidence of appendicitis. Scattered diverticulosis is noted along the descending and sigmoid colon, without evidence of diverticulitis. The bladder is decompressed, with a Foley catheter in place. The prostate is borderline prominent. No inguinal lymphadenopathy is seen. No acute osseous abnormalities are identified. 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 IMPRESSION: 1. No evidence of traumatic injury to the chest, abdomen or pelvis. 2. Changes of hepatic cirrhosis, with underlying diffuse heterogeneity of the liver, concerning for degenerative nodules. Heterogeneity is more prominent than on the prior study. Underlying mass cannot be excluded. Dynamic liver protocol MRI or CT would be helpful for further evaluation. 3. Minimal bibasilar atelectasis noted.  Lungs otherwise clear. 4. Scattered coronary artery calcifications seen. 5. Recanalization of the umbilical vein, extending to the vessels of the  anterior abdominal wall. Scattered gastric and esophageal varices noted. 6. Tiny umbilical hernia, containing only fat. 7. Mild scattered calcification along the abdominal aorta and its branches. 8. Scattered diverticulosis along the descending and sigmoid colon, without evidence of diverticulitis. 9. 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 Electronically Signed   By: Roanna Raider M.D.   On: 06/15/2015 02:47   Ct Cervical Spine Wo Contrast  06/15/2015  CLINICAL DATA:  Found down, with bruising about the head. Concern for head or cervical spine injury. Initial encounter. EXAM: CT HEAD WITHOUT CONTRAST CT CERVICAL SPINE WITHOUT CONTRAST TECHNIQUE:  Multidetector CT imaging of the head and cervical spine was performed following the standard protocol without intravenous contrast. Multiplanar CT image reconstructions of the cervical spine were also generated. COMPARISON:  None. FINDINGS: CT HEAD FINDINGS There is no evidence of acute infarction, mass lesion, or intra- or extra-axial hemorrhage on CT. Prominence of the ventricles and sulci reflects mild cortical volume loss. Mild cerebellar atrophy is noted. Mild periventricular and subcortical white matter change likely reflects small vessel ischemic microangiopathy. The brainstem and fourth ventricle are within normal limits. The basal ganglia are unremarkable in appearance. The cerebral hemispheres demonstrate grossly normal gray-white differentiation. No mass effect or midline shift is seen. There is no evidence of fracture; visualized osseous structures are unremarkable in appearance. The visualized portions of the orbits are within normal limits. Mucus retention cyst or polyp is noted at the left maxillary sinus. The remaining paranasal sinuses and mastoid air cells are well-aerated. Soft tissue swelling is noted overlying the left parietal calvarium and at the left vertex. CT CERVICAL SPINE FINDINGS There is no evidence of fracture or subluxation. Vertebral  bodies demonstrate normal height and alignment. Multilevel disc space narrowing is noted along the lower cervical spine, with small anterior and posterior disc osteophyte complexes. Prevertebral soft tissues are within normal limits. The visualized neural foramina are grossly unremarkable. The thyroid gland is unremarkable in appearance. No significant soft tissue abnormalities are seen. IMPRESSION: 1. No evidence of traumatic intracranial injury or fracture. 2. No evidence of fracture or subluxation along the cervical spine. 3. Soft tissue swelling overlying the left parietal calvarium and at the left vertex. 4. Mild cortical volume loss and scattered small vessel ischemic microangiopathy. 5. Mild degenerative change along the lower cervical spine. 6. Mucus retention cyst or polyp at the left maxillary sinus. Electronically Signed   By: Roanna Raider M.D.   On: 06/15/2015 02:37   Mr Brain Wo Contrast  06/15/2015  CLINICAL DATA:  Chief complaint. Fall with confusion. Alcoholic cirrhosis. Encephalopathy. EXAM: MRI HEAD WITHOUT CONTRAST TECHNIQUE: Multiplanar, multiecho pulse sequences of the brain and surrounding structures were obtained without intravenous contrast. COMPARISON:  CT head earlier today. FINDINGS: No evidence for acute infarction, hemorrhage, mass lesion, hydrocephalus, or extra-axial fluid. Generalized atrophy. Mild T2 and FLAIR hyperintensities, nonspecific, likely chronic microvascular ischemic change. 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 Prominent Perivascular spaces. Flow voids are maintained. No chronic hemorrhage. T1 weighted images do not show characteristic features of hepatic encephalopathy. No midline abnormality. Extracranial soft tissues unremarkable, except for a LEFT parietal vertex scalp hematoma. IMPRESSION: No intracranial posttraumatic sequelae are evident. There is advanced atrophy with  chronic microvascular ischemic change. No specific features of hepatic encephalopathy are observed. Electronically Signed   By: Elsie Stain M.D.   On: 06/15/2015 15:56   US Abdomen Complete  06/16/2015  CLINICAL DATA:  65 year old male with hyperbilirubinemia. Alcohol abuse. Acute hepatitis. EXAM: ABDOMEN ULTRASOUND COMPLETE COMPARISON:  No priors.  CT of the abdomen and pelvis 06/15/2015. FINDINGS: Gallbladder: No gallstones or wall thickening visualized. No sonographic Murphy sign noted by sonographer. Common bile duct:  Could not be visualized. Liver: Heterogeneous in echotexture, which generally increased echogenicity, compatible with severe hepatic steatosis. No discrete mass confidently identified. No intrahepatic biliary ductal dilatation noted. IVC: No abnormality visualized. Pancreas: Visualized portion unremarkable. Spleen: Size and appearance within normal limits.  9.7 cm in length. Right Kidney: Length: 11.9 cm. Echogenicity within normal limits. No mass or  hydronephrosis visualized. Left Kidney: Length: 12.0 cm. Echogenicity within normal limits. No mass or hydronephrosis visualized. Abdominal aorta: No aneurysm visualized. Other findings: Trace amount of perisplenic ascites. IMPRESSION: 1. Severe hepatic steatosis. 2. Trace amount of perisplenic ascites. Electronically Signed   By: Trudie Reed M.D.   On: 06/16/2015 10:04   Ct Abdomen Pelvis W Contrast  06/15/2015  CLINICAL DATA:  Found down with chest and abdominal bruising. Initial encounter. EXAM: CT CHEST, ABDOMEN, AND PELVIS WITH CONTRAST TECHNIQUE: Multidetector CT imaging of the chest, abdomen and pelvis was performed following the standard protocol during bolus administration of intravenous contrast. CONTRAST:  100 mL ISOVUE-300 IOPAMIDOL (ISOVUE-300) INJECTION 61% COMPARISON:  CT of the chest, abdomen and pelvis performed 04/05/2015 FINDINGS: CT CHEST Minimal bibasilar atelectasis is noted. The lungs are otherwise clear. No pleural  effusion or pneumothorax is seen. No masses are identified. There is no evidence of pulmonary parenchymal contusion. Scattered coronary artery calcifications are seen. No pericardial effusion is identified. No mediastinal lymphadenopathy is seen. There is no evidence of venous hemorrhage. The great vessels are grossly unremarkable in appearance. Mild calcification is seen along the aortic arch. The visualized portions of thyroid gland are unremarkable. No axillary lymphadenopathy is seen. There is no evidence of significant soft tissue injury along the chest wall. No acute osseous abnormalities are identified. Chronic right-sided rib deformities are noted. CT ABDOMEN AND PELVIS No free air or free fluid is seen within the abdomen or pelvis. There is no evidence of solid or hollow organ injury. There is a diffusely heterogeneous appearance to the liver, with multiple areas of decreased attenuation. Underlying mass cannot be excluded. The appearance raises question for hepatic cirrhosis and regenerative nodules. The gallbladder is within normal limits. The pancreas and adrenal glands are unremarkable. There is recanalization of the umbilical vein, extending to the vessels of the anterior abdominal wall. Scattered gastric and esophageal varices are seen. A tiny umbilical hernia is noted, containing only fat. The kidneys are unremarkable in appearance. There is no evidence of hydronephrosis. No renal or ureteral stones are seen. Mild nonspecific perinephric stranding is noted bilaterally. No free fluid is identified. The small bowel is unremarkable in appearance. The stomach is within normal limits. No acute vascular abnormalities are seen. Mild scattered calcification is seen along the abdominal aorta and its branches. The appendix is not definitely characterized; there is no evidence of appendicitis. Scattered diverticulosis is noted along the descending and sigmoid colon, without evidence of diverticulitis. The  bladder is decompressed, with a Foley catheter in place. The prostate is borderline prominent. No inguinal lymphadenopathy is seen. No acute osseous abnormalities are identified. 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 IMPRESSION: 1. No evidence of traumatic injury to the chest, abdomen or pelvis. 2. Changes of hepatic cirrhosis, with underlying diffuse heterogeneity of the liver, concerning for degenerative nodules. Heterogeneity is more prominent than on the prior study. Underlying mass cannot be excluded. Dynamic liver protocol MRI or CT would be helpful for further evaluation. 3. Minimal bibasilar atelectasis noted.  Lungs otherwise clear. 4. Scattered coronary artery calcifications seen. 5. Recanalization of the umbilical vein, extending to the vessels of the anterior abdominal wall. Scattered gastric and esophageal varices noted. 6. Tiny umbilical hernia, containing only fat. 7. Mild scattered calcification along the abdominal aorta and its branches. 8. Scattered diverticulosis along the descending and sigmoid colon, without evidence of diverticulitis. 9. 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 Electronically Signed   By: Beryle Beams.D.  On: 06/15/2015 02:47    CBC  Recent Labs Lab 06/14/15 2237 06/15/15 0621 06/16/15 0538  WBC 8.0 7.0 6.0  HGB 13.2 11.7* 11.6*  HCT 36.5* 32.6* 33.6*  PLT 71* 60* 64*  MCV 99.7 100.6* 99.4  MCH 36.1* 36.1* 34.3*  MCHC 36.2* 35.9 34.5  RDW 15.0 15.4 16.2*  LYMPHSABS 0.5*  --   --   MONOABS 1.2*  --   --   EOSABS 0.0  --   --   BASOSABS 0.0  --   --     Chemistries   Recent Labs Lab 06/14/15 2237 06/14/15 2330 06/15/15 0621 06/16/15 0538 06/16/15 0957  NA 130*  --  135 135  --   K 2.7*  --  3.0* 2.7*  --   CL 92*  --  98* 102  --   CO2 21*  --  21* 23  --   GLUCOSE 119*  --  101* 103*  --   BUN 18  --  14 11  --   CREATININE 1.10  --  1.03 0.74  --   CALCIUM 8.7*  --  8.0* 8.0*  --   MG  --  1.9  --   --  1.5*  AST 284*  --   274* 247*  --   ALT 102*  --  92* 85*  --   ALKPHOS 148*  --  122 126  --   BILITOT 13.6*  --  13.1* 13.1*  --    ------------------------------------------------------------------------------------------------------------------ estimated creatinine clearance is 108.1 mL/min (by C-G formula based on Cr of 0.74). ------------------------------------------------------------------------------------------------------------------ No results for input(s): HGBA1C in the last 72 hours. ------------------------------------------------------------------------------------------------------------------ No results for input(s): CHOL, HDL, LDLCALC, TRIG, CHOLHDL, LDLDIRECT in the last 72 hours. ------------------------------------------------------------------------------------------------------------------  Recent Labs  06/15/15 0621  TSH 0.916   ------------------------------------------------------------------------------------------------------------------  Recent Labs  06/15/15 0621 06/15/15 1704  VITAMINB12  --  1494*  FOLATE 6.7  --   FERRITIN  --  1542*  TIBC  --  106*  IRON  --  100  RETICCTPCT  --  3.6*    Coagulation profile  Recent Labs Lab 06/16/15 0538  INR 2.19*    No results for input(s): DDIMER in the last 72 hours.  Cardiac Enzymes  Recent Labs Lab 06/15/15 0621 06/15/15 1126 06/15/15 1704  TROPONINI 0.10* 0.11* 0.08*   ------------------------------------------------------------------------------------------------------------------ Invalid input(s): POCBNP  No results for input(s): GLUCAP in the last 72 hours.   RAI,RIPUDEEP M.D. [facility] 06/16/2015, 12:39 PM  Pager: [phone] Between 7am to 7pm - call Pager - [phone]  After 7pm go to [email or web address] - password [email or web address]  Call night coverage person covering after 7pm

## 2015-06-16 NOTE — Consult Note (Signed)
[facility] Consult: 11:43 AM 06/16/2015  LOS: 1 day    Referring Provider: Dr Tana Coast  Primary Care Physician:  Berkley Harvey, NP Primary Gastroenterologist:  Dr. Carlean Purl     Reason for Consultation:  Jaundice.    HPI: Gary [name] is a 65 y.o. male.  CHF (EF 55 to 60% 11/2014, repeat echo pending).  Alcoholic with cirrhosis and hx ETOH hepatitis diagnosed 01/2014;  t bili to 26, alk phos 437, AST/ALT 360/120 treated with steroids.  On lactulose for HE as of 01/2014.  Hep ABC serologies negative 02/2015.  Admitted 11/2014 with DTs.  T bili 5.8, alk phos 128,  AST/ALT 149/66. Platelets nadir of 34 04/2015.  Coags max of 17.3 and 1.4 in 11/2014.   Macrocytic anemia.  Hgb range 10 to 11 in 04/2015.   Last GI OV was 12/2014.  Dr Carlean Purl said: "plan for complete abd [place] and hepatic elastography upon return. Could need a screening EGD also".  No showed for GI OV 02/01/15.  No response to letter sent by Dr Darnell Level that day.  04/2015 LFTs: 1.8/127/121/35. (t bili/alk phos/ast/alt)  Admitted yesterday 4/14 after found down at home.  Wife was in [place] and neighbors had not seen him for 3 to 4 days.  A neighbor found him face down on floor.  Disoriented, weak, dry oral mucosa, bruised, jaundiced, incontinent of urine.  Pt's MS improved today and he tells story of tripping over his small dog, he was on the stairs.  He says he was there for "5 days".  He is tender at areas of bruising on back, arms but no pain at rest.  Denies dyspnea.  Denies black or bloody stools.  Appetite fair, no n/v.  No orifice bleeding.  Admits to 4 beers per day.  Takes 200 mg Ibuprofen daily for foot pain from old injury.   Interestingly in 04/2015 when he fell, gave story of tripping over his dog.  Sustained nasal fx then. DTs were treated with beer.   CT head:  advanced atrophy for age.  Blood in sinuses but no fracture. Soft tissue injury to right nare.  CT scan abd/pelvis: for eval of abdominal bruising after fall:  Cirrhosis with ? degenerative nodules, rule out underlying mass.  Recanalization of umbilical veing.  Esophageal and gastric varices.  Tiny umbilical hernia.  Diverticulosis.  L% pars defect. Coronary, Aortic and branch vessel calcifications.  Ultrasound: severe hepatic steatosis, trace perisplenic ascites.  CT chest: Malrotation variant. The proximal small bowel loops including the jejunum are largely situated within the right abdomen. No evidence for volvulus.  Several indeterminate low-attenuation structures are identified in the liver. In this patient who is at increased risk for liver malignancy further evaluation with nonemergent MRI of the liver with contrast material is recommended  Tbili 13.1, alk phos 126, AST/ALT 247/85.  Ammonia max 96.  ETOH level not obtained.  coags now 24/2.1.  Platelets 64.  Hgb 11.6.   CK max 2800, improved to 1066.   troponins max 0.11.  No AKI.  + hypokalemia.  Na 135   Past Medical History  Diagnosis Date  . Hypertension   . Arthritis   . Alcohol abuse   . CHF (congestive heart failure) ([place])   . Elevated liver enzymes   . Alcoholic cirrhosis of liver without ascites ([place])   . Thrombocytopenia ([place])   . Acute hepatitis   . Vitamin D deficiency   . Hypercholesterolemia   . Macrocytic anemia   . Tremors of nervous system     Past Surgical History  Procedure Laterality Date  . Tonsillectomy    . Wisdom tooth extraction      Prior to Admission medications   Medication Sig Start Date End Date Taking? Authorizing Provider  acetaminophen (TYLENOL) 500 MG tablet Take 500 mg by mouth every 6 (six) hours as needed.    Historical Provider, MD  allopurinol (ZYLOPRIM) 100 MG tablet Take 1.5 tablets (150 mg total) by mouth daily. 04/26/14   Barton Dubois, MD  feeding supplement, ENSURE COMPLETE,  (ENSURE COMPLETE) LIQD Take 237 mLs by mouth 2 (two) times daily between meals. Patient taking differently: Take 237 mLs by mouth 2 (two) times daily between meals. Pt uses Med Pass 90 03/04/14   Eugenie Filler, MD  folic acid (FOLVITE) 1 MG tablet Take 1 mg by mouth daily.    Historical Provider, MD  furosemide (LASIX) 40 MG tablet Take 40 mg by mouth.    Historical Provider, MD  lactulose (CHRONULAC) 10 GM/15ML solution Take 45 mLs (30 g total) by mouth 3 (three) times daily. 11/13/14   Cherene Altes, MD  lisinopril (PRINIVIL,ZESTRIL) 20 MG tablet Take 1 tablet (20 mg total) by mouth daily. 04/26/14   Barton Dubois, MD  magnesium oxide (MAG-OX) 400 (241.3 MG) MG tablet Take 1 tablet (400 mg total) by mouth 2 (two) times daily. 11/13/14   Cherene Altes, MD  metoprolol tartrate (LOPRESSOR) 25 MG tablet Take 0.5 tablets (12.5 mg total) by mouth 2 (two) times daily. 04/26/14   Barton Dubois, MD  Multiple Vitamin (MULTIVITAMIN WITH MINERALS) TABS tablet Take 1 tablet by mouth daily. 03/04/14   Eugenie Filler, MD  Omega-3 Fatty Acids (FISH OIL PO) Take 1 capsule by mouth daily.    Historical Provider, MD  oxyCODONE (OXY IR/ROXICODONE) 5 MG immediate release tablet Take 1 tablet (5 mg total) by mouth every 4 (four) hours as needed for moderate pain. 04/07/15   Johnathan Hausen, MD  oxymetazoline (AFRIN) 0.05 % nasal spray Place 1 spray into both nostrils 2 (two) times daily. 04/07/15   Johnathan Hausen, MD  pantoprazole (PROTONIX) 40 MG tablet Take 1 tablet (40 mg total) by mouth daily. 04/26/14   Barton Dubois, MD  potassium chloride SA (K-DUR,KLOR-CON) 20 MEQ tablet Take 1 tablet (20 mEq total) by mouth 2 (two) times daily. Take for 4 days then stop. 11/13/14   Cherene Altes, MD  thiamine 100 MG tablet Take 1 tablet (100 mg total) by mouth daily. Patient not taking: Reported on 04/05/2015 11/13/14   Cherene Altes, MD  Vitamin D, Ergocalciferol, (DRISDOL) 50000 UNITS CAPS capsule Take 50,000 Units by  mouth every 7 (seven) days.     Historical Provider, MD    Scheduled Meds: . cefTRIAXone (ROCEPHIN)  IV  1 g Intravenous Q24H  . folic acid  1 mg Oral Daily  . lactulose  30 g Oral TID  . magnesium sulfate LVP 250-500 ml  3 g Intravenous Once  . metoprolol tartrate  12.5 mg Oral BID  .  multivitamin with minerals  1 tablet Oral Daily  . pantoprazole  40 mg Oral Daily  . potassium chloride  10 mEq Intravenous Q1 Hr x 4  . potassium chloride  40 mEq Oral Q4H  . sodium chloride flush  3 mL Intravenous Q12H  . thiamine  100 mg Oral Daily   Infusions:   PRN Meds: LORazepam **OR** LORazepam, oxyCODONE, senna-docusate   Allergies as of 06/14/2015  . (No Known Allergies)    Family History  Problem Relation Age of Onset  . Heart failure Father   . Colon cancer Neg Hx   . Colon polyps Neg Hx   . Diabetes Neg Hx   . Kidney disease Neg Hx   . Gallbladder disease Neg Hx   . Esophageal cancer Neg Hx   . Alcohol abuse Father   . COPD Mother   . Heart attack Father     Social History   Social History  . Marital Status: Divorced    Spouse Name: N/A  . Number of Children: 2  . Years of Education: N/A   Occupational History  . Retired    Social History Main Topics  . Smoking status: Never Smoker   . Smokeless tobacco: Never Used  . Alcohol Use: 4.8 oz/week    7 Cans of beer, 1 Shots of liquor per week     Comment: Pt stated does not drink now - 03/16/2014         04/05/2015  " i  still drink 2 beers every date  "  . Drug Use: No  . Sexual Activity: Yes   Other Topics Concern  . Not on file   Social History Narrative    REVIEW OF SYSTEMS: Constitutional:  No extreme fatigue.  Generally weak.  ENT:  No nose bleeds Pulm:  No cough or SOB CV:  No chest pain.  Tachy to 120s to 140s at 0700 -0800 today.  GU:  No hematuria, no frequency GI:  Per HPI.   Heme:  Per HPI.   Transfusions:  2 packed platelets in 04/2015. Neuro:  No headaches, no peripheral tingling or  numbness Derm:  No itching, no rash or sores.  Endocrine:  No sweats or chills.  No polyuria or dysuria Immunization:  Not queried Travel:  None beyond local counties in last few months.    PHYSICAL EXAM: Vital signs in last 24 hours: Filed Vitals:   06/16/15 0610 06/16/15 0755  BP: 116/73 107/78  Pulse: 91 143  Temp: 98.6 F (37 C)   Resp: 18 17   Wt Readings from Last 3 Encounters:  04/05/15 83.915 kg (185 lb)  11/21/14 82.158 kg (181 lb 2 oz)  11/10/14 82.7 kg (182 lb 5.1 oz)    General: looks chronically ill.  Comfortable.  Dishevelled.  Head:  Dried blood on scalp  Eyes:  Icteric.  No pallor Ears:  Not HOH  Nose:  No discharge Mouth:  Poor dentition.   Pink, moist oral MM Neck:  No JVD, masses or TMG Lungs:  Clear bil.  Extensive bruising bil, covers 2/3 of his back, extends to hips Heart: RRR.  No mrg.  S1, s2 audible.   Abdomen:  Soft, ND.  Do not see bruising but tender in upper abdomen.  No obvious ascites or fluid wave.  No masses.   Rectal: deferred  GU: no scrotal swelling but scrotum is erythematous.    Musc/Skeltl: no joint swelling or redness Extremities:  No CCE  Neurologic:  Oriented to cone, to self, but 1996 is the year.  Able to give hx of how he fell. + asterixis.  Slow speech and response time.  Skin:  Some abrasions   Psych:  Cooperative. Not agitated or anxious.   Intake/Output from previous day: 04/14 0701 - 04/15 0700 In: 756.3 [P.O.:120; I.V.:586.3; IV Piggyback:50] Out: 1001 [Urine:1000; Stool:1] Intake/Output this shift: Total I/O In: -  Out: 1 [Stool:1]  LAB RESULTS:  Recent Labs  06/14/15 2237 06/15/15 0621 06/16/15 0538  WBC 8.0 7.0 6.0  HGB 13.2 11.7* 11.6*  HCT 36.5* 32.6* 33.6*  PLT 71* 60* 64*   BMET Lab Results  Component Value Date   NA 135 06/16/2015   NA 135 06/15/2015   NA 130* 06/14/2015   K 2.7* 06/16/2015   K 3.0* 06/15/2015   K 2.7* 06/14/2015   CL 102 06/16/2015   CL 98* 06/15/2015   CL 92*  06/14/2015   CO2 23 06/16/2015   CO2 21* 06/15/2015   CO2 21* 06/14/2015   GLUCOSE 103* 06/16/2015   GLUCOSE 101* 06/15/2015   GLUCOSE 119* 06/14/2015   BUN 11 06/16/2015   BUN 14 06/15/2015   BUN 18 06/14/2015   CREATININE 0.74 06/16/2015   CREATININE 1.03 06/15/2015   CREATININE 1.10 06/14/2015   CALCIUM 8.0* 06/16/2015   CALCIUM 8.0* 06/15/2015   CALCIUM 8.7* 06/14/2015   LFT  Recent Labs  06/14/15 2237 06/15/15 0621 06/16/15 0538  PROT 6.1* 5.3* 4.8*  ALBUMIN 2.8* 2.5* 2.2*  AST 284* 274* 247*  ALT 102* 92* 85*  ALKPHOS 148* 122 126  BILITOT 13.6* 13.1* 13.1*   PT/INR Lab Results  Component Value Date   INR 2.19* 06/16/2015   INR 1.58* 04/05/2015   INR 1.3* 11/21/2014   Hepatitis Panel No results for input(s): HEPBSAG, HCVAB, HEPAIGM, HEPBIGM in the last 72 hours. C-Diff No components found for: CDIFF Lipase     Component Value Date/Time   LIPASE 52* 11/10/2014 1722    Drugs of Abuse     Component Value Date/Time   LABOPIA NONE DETECTED 11/10/2014 1548   COCAINSCRNUR NONE DETECTED 11/10/2014 1548   LABBENZ NONE DETECTED 11/10/2014 1548   AMPHETMU NONE DETECTED 11/10/2014 1548   THCU NONE DETECTED 11/10/2014 1548   LABBARB NONE DETECTED 11/10/2014 1548     RADIOLOGY STUDIES: Dg Chest 1 View  06/15/2015  CLINICAL DATA:  Found down at home. EXAM: CHEST 1 VIEW COMPARISON:  Chest radiograph April 05, 2015 FINDINGS: Cardiac silhouette is mildly enlarged, mediastinal silhouette is unremarkable for this low inspiratory portable examination with crowded vasculature markings. The lungs are clear without pleural effusions or focal consolidations. Bandlike density LEFT lung base. Stable LEFT apical pleural thickening. Trachea projects midline and there is no pneumothorax. Included soft tissue planes and osseous structures are non-suspicious. Old RIGHT posterior rib fractures. Old RIGHT mid clavicle fracture. Osteopenia. IMPRESSION: Mild cardiomegaly and LEFT  lung base atelectasis. Electronically Signed   By: Elon Alas M.D.   On: 06/15/2015 01:40   Dg Elbow Complete Left  06/15/2015  CLINICAL DATA:  [place] at home. EXAM: LEFT ELBOW - COMPLETE 3+ VIEW COMPARISON:  None. FINDINGS: There is no evidence of fracture, dislocation, or joint effusion. There is no evidence of arthropathy or other focal bone abnormality. Soft tissues are unremarkable. IMPRESSION: Negative. Electronically Signed   By: Andreas Newport M.D.   On: 06/15/2015 01:42   Dg Elbow Complete Right  06/15/2015  CLINICAL DATA:  [place] at home EXAM:  RIGHT ELBOW - COMPLETE 3+ VIEW COMPARISON:  None. FINDINGS: There is no evidence of fracture, dislocation, or joint effusion. There is no evidence of arthropathy or other focal bone abnormality. Soft tissues are unremarkable. IMPRESSION: Negative. Electronically Signed   By: Andreas Newport M.D.   On: 06/15/2015 01:42   Ct Head Wo Contrast  06/15/2015  CLINICAL DATA:  Found down, with bruising about the head. Concern for head or cervical spine injury. Initial encounter. EXAM: CT HEAD WITHOUT CONTRAST CT CERVICAL SPINE WITHOUT CONTRAST TECHNIQUE: Multidetector CT imaging of the head and cervical spine was performed following the standard protocol without intravenous contrast. Multiplanar CT image reconstructions of the cervical spine were also generated. COMPARISON:  None. FINDINGS: CT HEAD FINDINGS There is no evidence of acute infarction, mass lesion, or intra- or extra-axial hemorrhage on CT. Prominence of the ventricles and sulci reflects mild cortical volume loss. Mild cerebellar atrophy is noted. Mild periventricular and subcortical white matter change likely reflects small vessel ischemic microangiopathy. The brainstem and fourth ventricle are within normal limits. The basal ganglia are unremarkable in appearance. The cerebral hemispheres demonstrate grossly normal gray-white differentiation. No mass effect or midline shift is seen. There is no  evidence of fracture; visualized osseous structures are unremarkable in appearance. The visualized portions of the orbits are within normal limits. Mucus retention cyst or polyp is noted at the left maxillary sinus. The remaining paranasal sinuses and mastoid air cells are well-aerated. Soft tissue swelling is noted overlying the left parietal calvarium and at the left vertex. CT CERVICAL SPINE FINDINGS There is no evidence of fracture or subluxation. Vertebral bodies demonstrate normal height and alignment. Multilevel disc space narrowing is noted along the lower cervical spine, with small anterior and posterior disc osteophyte complexes. Prevertebral soft tissues are within normal limits. The visualized neural foramina are grossly unremarkable. The thyroid gland is unremarkable in appearance. No significant soft tissue abnormalities are seen. IMPRESSION: 1. No evidence of traumatic intracranial injury or fracture. 2. No evidence of fracture or subluxation along the cervical spine. 3. Soft tissue swelling overlying the left parietal calvarium and at the left vertex. 4. Mild cortical volume loss and scattered small vessel ischemic microangiopathy. 5. Mild degenerative change along the lower cervical spine. 6. Mucus retention cyst or polyp at the left maxillary sinus. Electronically Signed   By: Garald Balding M.D.   On: 06/15/2015 02:37   Ct Chest W Contrast  06/15/2015  CLINICAL DATA:  Found down with chest and abdominal bruising. Initial encounter. EXAM: CT CHEST, ABDOMEN, AND PELVIS WITH CONTRAST TECHNIQUE: Multidetector CT imaging of the chest, abdomen and pelvis was performed following the standard protocol during bolus administration of intravenous contrast. CONTRAST:  100 mL ISOVUE-300 IOPAMIDOL (ISOVUE-300) INJECTION 61% COMPARISON:  CT of the chest, abdomen and pelvis performed 04/05/2015 FINDINGS: CT CHEST Minimal bibasilar atelectasis is noted. The lungs are otherwise clear. No pleural effusion or  pneumothorax is seen. No masses are identified. There is no evidence of pulmonary parenchymal contusion. Scattered coronary artery calcifications are seen. No pericardial effusion is identified. No mediastinal lymphadenopathy is seen. There is no evidence of venous hemorrhage. The great vessels are grossly unremarkable in appearance. Mild calcification is seen along the aortic arch. The visualized portions of thyroid gland are unremarkable. No axillary lymphadenopathy is seen. There is no evidence of significant soft tissue injury along the chest wall. No acute osseous abnormalities are identified. Chronic right-sided rib deformities are noted. CT ABDOMEN AND PELVIS No free air or free  fluid is seen within the abdomen or pelvis. There is no evidence of solid or hollow organ injury. There is a diffusely heterogeneous appearance to the liver, with multiple areas of decreased attenuation. Underlying mass cannot be excluded. The appearance raises question for hepatic cirrhosis and regenerative nodules. The gallbladder is within normal limits. The pancreas and adrenal glands are unremarkable. There is recanalization of the umbilical vein, extending to the vessels of the anterior abdominal wall. Scattered gastric and esophageal varices are seen. A tiny umbilical hernia is noted, containing only fat. The kidneys are unremarkable in appearance. There is no evidence of hydronephrosis. No renal or ureteral stones are seen. Mild nonspecific perinephric stranding is noted bilaterally. No free fluid is identified. The small bowel is unremarkable in appearance. The stomach is within normal limits. No acute vascular abnormalities are seen. Mild scattered calcification is seen along the abdominal aorta and its branches. The appendix is not definitely characterized; there is no evidence of appendicitis. Scattered diverticulosis is noted along the descending and sigmoid colon, without evidence of diverticulitis. The bladder is  decompressed, with a Foley catheter in place. The prostate is borderline prominent. No inguinal lymphadenopathy is seen. No acute osseous abnormalities are identified. 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 IMPRESSION: 1. No evidence of traumatic injury to the chest, abdomen or pelvis. 2. Changes of hepatic cirrhosis, with underlying diffuse heterogeneity of the liver, concerning for degenerative nodules. Heterogeneity is more prominent than on the prior study. Underlying mass cannot be excluded. Dynamic liver protocol MRI or CT would be helpful for further evaluation. 3. Minimal bibasilar atelectasis noted.  Lungs otherwise clear. 4. Scattered coronary artery calcifications seen. 5. Recanalization of the umbilical vein, extending to the vessels of the anterior abdominal wall. Scattered gastric and esophageal varices noted. 6. Tiny umbilical hernia, containing only fat. 7. Mild scattered calcification along the abdominal aorta and its branches. 8. Scattered diverticulosis along the descending and sigmoid colon, without evidence of diverticulitis. 9. 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 Electronically Signed   By: Garald Balding M.D.   On: 06/15/2015 02:47   Ct Cervical Spine Wo Contrast  06/15/2015  CLINICAL DATA:  Found down, with bruising about the head. Concern for head or cervical spine injury. Initial encounter. EXAM: CT HEAD WITHOUT CONTRAST CT CERVICAL SPINE WITHOUT CONTRAST TECHNIQUE: Multidetector CT imaging of the head and cervical spine was performed following the standard protocol without intravenous contrast. Multiplanar CT image reconstructions of the cervical spine were also generated. COMPARISON:  None. FINDINGS: CT HEAD FINDINGS There is no evidence of acute infarction, mass lesion, or intra- or extra-axial hemorrhage on CT. Prominence of the ventricles and sulci reflects mild cortical volume loss. Mild cerebellar atrophy is noted. Mild periventricular and subcortical white matter  change likely reflects small vessel ischemic microangiopathy. The brainstem and fourth ventricle are within normal limits. The basal ganglia are unremarkable in appearance. The cerebral hemispheres demonstrate grossly normal gray-white differentiation. No mass effect or midline shift is seen. There is no evidence of fracture; visualized osseous structures are unremarkable in appearance. The visualized portions of the orbits are within normal limits. Mucus retention cyst or polyp is noted at the left maxillary sinus. The remaining paranasal sinuses and mastoid air cells are well-aerated. Soft tissue swelling is noted overlying the left parietal calvarium and at the left vertex. CT CERVICAL SPINE FINDINGS There is no evidence of fracture or subluxation. Vertebral bodies demonstrate normal height and alignment. Multilevel disc space narrowing is noted along  the lower cervical spine, with small anterior and posterior disc osteophyte complexes. Prevertebral soft tissues are within normal limits. The visualized neural foramina are grossly unremarkable. The thyroid gland is unremarkable in appearance. No significant soft tissue abnormalities are seen. IMPRESSION: 1. No evidence of traumatic intracranial injury or fracture. 2. No evidence of fracture or subluxation along the cervical spine. 3. Soft tissue swelling overlying the left parietal calvarium and at the left vertex. 4. Mild cortical volume loss and scattered small vessel ischemic microangiopathy. 5. Mild degenerative change along the lower cervical spine. 6. Mucus retention cyst or polyp at the left maxillary sinus. Electronically Signed   By: Garald Balding M.D.   On: 06/15/2015 02:37   Mr Brain Wo Contrast  06/15/2015  CLINICAL DATA:  Chief complaint. Fall with confusion. Alcoholic cirrhosis. Encephalopathy. EXAM: MRI HEAD WITHOUT CONTRAST TECHNIQUE: Multiplanar, multiecho pulse sequences of the brain and surrounding structures were obtained without  intravenous contrast. COMPARISON:  CT head earlier today. FINDINGS: No evidence for acute infarction, hemorrhage, mass lesion, hydrocephalus, or extra-axial fluid. Generalized atrophy. Mild T2 and FLAIR hyperintensities, nonspecific, likely chronic microvascular ischemic change. 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 Prominent Perivascular spaces. Flow voids are maintained. No chronic hemorrhage. T1 weighted images do not show characteristic features of hepatic encephalopathy. No midline abnormality. Extracranial soft tissues unremarkable, except for a LEFT parietal vertex scalp hematoma. IMPRESSION: No intracranial posttraumatic sequelae are evident. There is advanced atrophy with chronic microvascular ischemic change. No specific features of hepatic encephalopathy are observed. Electronically Signed   By: Staci Righter M.D.   On: 06/15/2015 15:56   US Abdomen Complete  06/16/2015  CLINICAL DATA:  65 year old male with hyperbilirubinemia. Alcohol abuse. Acute hepatitis. EXAM: ABDOMEN ULTRASOUND COMPLETE COMPARISON:  No priors.  CT of the abdomen and pelvis 06/15/2015. FINDINGS: Gallbladder: No gallstones or wall thickening visualized. No sonographic Murphy sign noted by sonographer. Common bile duct:  Could not be visualized. Liver: Heterogeneous in echotexture, which generally increased echogenicity, compatible with severe hepatic steatosis. No discrete mass confidently identified. No intrahepatic biliary ductal dilatation noted. IVC: No abnormality visualized. Pancreas: Visualized portion unremarkable. Spleen: Size and appearance within normal limits.  9.7 cm in length. Right Kidney: Length: 11.9 cm. Echogenicity within normal limits. No mass or hydronephrosis visualized. Left Kidney: Length: 12.0 cm. Echogenicity within normal limits. No mass or hydronephrosis visualized. Abdominal aorta: No aneurysm visualized. Other  findings: Trace amount of perisplenic ascites. IMPRESSION: 1. Severe hepatic steatosis. 2. Trace amount of perisplenic ascites. Electronically Signed   By: Vinnie Langton M.D.   On: 06/16/2015 10:04   Ct Abdomen Pelvis W Contrast  06/15/2015  CLINICAL DATA:  Found down with chest and abdominal bruising. Initial encounter. EXAM: CT CHEST, ABDOMEN, AND PELVIS WITH CONTRAST TECHNIQUE: Multidetector CT imaging of the chest, abdomen and pelvis was performed following the standard protocol during bolus administration of intravenous contrast. CONTRAST:  100 mL ISOVUE-300 IOPAMIDOL (ISOVUE-300) INJECTION 61% COMPARISON:  CT of the chest, abdomen and pelvis performed 04/05/2015 FINDINGS: CT CHEST Minimal bibasilar atelectasis is noted. The lungs are otherwise clear. No pleural effusion or pneumothorax is seen. No masses are identified. There is no evidence of pulmonary parenchymal contusion. Scattered coronary artery calcifications are seen. No pericardial effusion is identified. No mediastinal lymphadenopathy is seen. There is no evidence of venous hemorrhage. The great vessels are grossly unremarkable in appearance. Mild calcification is seen along the aortic arch. The visualized portions  of thyroid gland are unremarkable. No axillary lymphadenopathy is seen. There is no evidence of significant soft tissue injury along the chest wall. No acute osseous abnormalities are identified. Chronic right-sided rib deformities are noted. CT ABDOMEN AND PELVIS No free air or free fluid is seen within the abdomen or pelvis. There is no evidence of solid or hollow organ injury. There is a diffusely heterogeneous appearance to the liver, with multiple areas of decreased attenuation. Underlying mass cannot be excluded. The appearance raises question for hepatic cirrhosis and regenerative nodules. The gallbladder is within normal limits. The pancreas and adrenal glands are unremarkable. There is recanalization of the umbilical vein,  extending to the vessels of the anterior abdominal wall. Scattered gastric and esophageal varices are seen. A tiny umbilical hernia is noted, containing only fat. The kidneys are unremarkable in appearance. There is no evidence of hydronephrosis. No renal or ureteral stones are seen. Mild nonspecific perinephric stranding is noted bilaterally. No free fluid is identified. The small bowel is unremarkable in appearance. The stomach is within normal limits. No acute vascular abnormalities are seen. Mild scattered calcification is seen along the abdominal aorta and its branches. The appendix is not definitely characterized; there is no evidence of appendicitis. Scattered diverticulosis is noted along the descending and sigmoid colon, without evidence of diverticulitis. The bladder is decompressed, with a Foley catheter in place. The prostate is borderline prominent. No inguinal lymphadenopathy is seen. No acute osseous abnormalities are identified. 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 IMPRESSION: 1. No evidence of traumatic injury to the chest, abdomen or pelvis. 2. Changes of hepatic cirrhosis, with underlying diffuse heterogeneity of the liver, concerning for degenerative nodules. Heterogeneity is more prominent than on the prior study. Underlying mass cannot be excluded. Dynamic liver protocol MRI or CT would be helpful for further evaluation. 3. Minimal bibasilar atelectasis noted.  Lungs otherwise clear. 4. Scattered coronary artery calcifications seen. 5. Recanalization of the umbilical vein, extending to the vessels of the anterior abdominal wall. Scattered gastric and esophageal varices noted. 6. Tiny umbilical hernia, containing only fat. 7. Mild scattered calcification along the abdominal aorta and its branches. 8. Scattered diverticulosis along the descending and sigmoid colon, without evidence of diverticulitis. 9. 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 Electronically Signed   By: Garald Balding  M.D.   On: 06/15/2015 02:47    ENDOSCOPIC STUDIES: None.   IMPRESSION:   *  Jaundice in known cirrhotic.   Thrombocytopenia, coagulopathy, encephalopathy, esophageal and gastric varices, trace ascites.  Has never had EGD.   ? [place]  LFTs could be worse as a result of shock liver in setting of prolonged time down, however his renal parameters lookd remarkably good so less liklihood we are seeing shock liver. This is probably mostly, if not all, ongoing/recurrent ETOH hepatitis and his cirrhosis.    *   Extensive bruising of upper body post fall at home, elevated CK but no AKI  Given extent of bruising, surprised his Hgb is not lower and that BUN/creatinine are not deranged    *  Alcoholism.  Chronic. Admits to 4 beers daily. Wants nothing to do with AA.      PLAN:     *  AFP level  *  Will need MRI to eval the liver, rule out Calcium.  Not clear on timing of this.   *  Will eventually need EGD to assess the varices.    Azucena Freed  06/16/2015, 11:43 AM Pager: [phone]

## 2015-06-16 NOTE — Progress Notes (Signed)
CRITICAL VALUE ALERT  Critical value received:  K 2.7  Date of notification:  06/16/15  Time of notification:  0730  Critical value read back:Yes.    Nurse who received alert:  Aura Dials  MD notified (1st page):  Dr. Isidoro Donning  Time of first page: [phone]  Responding MD:  Dr. Isidoro Donning  Time MD responded:  7:45 AM

## 2015-06-16 NOTE — Procedures (Signed)
History: 65 yo M With altered mental status  Sedation: none  Technique: This is a 21 channel routine scalp EEG performed at the bedside with bipolar and monopolar montages arranged in accordance to the international 10/20 system of electrode placement. One channel was dedicated to EKG recording.    Background: The background consists predominantly of irregualr low voltage delta activity, but there is a posterior rhythm seen briefly at times achieving a maximal frequency of 8 Hz. There is sleep recorded with symmetric structures.   Photic stimulation: Physiologic driving is not performed  EEG Abnormalities: 1) Mild generalized irregular delta activity.   Clinical Interpretation: This EEG is consistent with a mild generalized non-specific cerebral dysfunction(encephalopathy). There was no seizure or seizure predisposition recorded on this study. Please note that a normal EEG does not preclude the possibility of epilepsy.   Ritta Slot, MD [facility] [phone]  If 7pm- 7am, please page neurology on call as listed in AMION.

## 2015-06-16 NOTE — Progress Notes (Signed)
Per tele patient HR now back down to 97.

## 2015-06-16 NOTE — Progress Notes (Signed)
Pt's HR went up to 120's per CCMD, but did not sustain. Pt is resting in bed now with HR 96. Dr. Isidoro Donning text paged. Will cont to monitor

## 2015-06-16 NOTE — Progress Notes (Signed)
  Echocardiogram 2D Echocardiogram has been performed.  Nolon Rod 06/16/2015, 11:16 AM

## 2015-06-16 NOTE — Progress Notes (Signed)
   06/16/15 0755  Vitals  BP 107/78 mmHg  MAP (mmHg) 85  BP Method Automatic  Pulse Rate (!) 143  Pulse Rate Source Monitor  Oxygen Therapy  SpO2 95 %   Paged Dr Isidoro Donning, awaiting orders

## 2015-06-17 ENCOUNTER — Inpatient Hospital Stay (HOSPITAL_COMMUNITY): Payer: Medicare Other

## 2015-06-17 DIAGNOSIS — R55 Syncope and collapse: Secondary | ICD-10-CM

## 2015-06-17 DIAGNOSIS — K7011 Alcoholic hepatitis with ascites: Principal | ICD-10-CM

## 2015-06-17 DIAGNOSIS — K7031 Alcoholic cirrhosis of liver with ascites: Secondary | ICD-10-CM

## 2015-06-17 DIAGNOSIS — K7010 Alcoholic hepatitis without ascites: Secondary | ICD-10-CM | POA: Insufficient documentation

## 2015-06-17 DIAGNOSIS — R4182 Altered mental status, unspecified: Secondary | ICD-10-CM

## 2015-06-17 LAB — ECHOCARDIOGRAM LIMITED
Height: 70 in
Weight: 3460.34 oz

## 2015-06-17 LAB — CBC
HEMATOCRIT: 33.8 % — AB (ref 39.0–52.0)
HEMOGLOBIN: 11.9 g/dL — AB (ref 13.0–17.0)
MCH: 35.1 pg — ABNORMAL HIGH (ref 26.0–34.0)
MCHC: 35.2 g/dL (ref 30.0–36.0)
MCV: 99.7 fL (ref 78.0–100.0)
Platelets: 71 10*3/uL — ABNORMAL LOW (ref 150–400)
RBC: 3.39 MIL/uL — ABNORMAL LOW (ref 4.22–5.81)
RDW: 16.9 % — ABNORMAL HIGH (ref 11.5–15.5)
WBC: 6 10*3/uL (ref 4.0–10.5)

## 2015-06-17 LAB — COMPREHENSIVE METABOLIC PANEL
ALBUMIN: 2 g/dL — AB (ref 3.5–5.0)
ALK PHOS: 125 U/L (ref 38–126)
ALT: 86 U/L — ABNORMAL HIGH (ref 17–63)
AST: 216 U/L — AB (ref 15–41)
Anion gap: 9 (ref 5–15)
BILIRUBIN TOTAL: 13.9 mg/dL — AB (ref 0.3–1.2)
BUN: 8 mg/dL (ref 6–20)
CALCIUM: 8.3 mg/dL — AB (ref 8.9–10.3)
CO2: 21 mmol/L — ABNORMAL LOW (ref 22–32)
CREATININE: 0.72 mg/dL (ref 0.61–1.24)
Chloride: 104 mmol/L (ref 101–111)
GFR calc Af Amer: >60 mL/min (ref >60)
GFR calc non Af Amer: >60 mL/min (ref >60)
GLUCOSE: 134 mg/dL — AB (ref 65–99)
Potassium: 3.9 mmol/L (ref 3.5–5.1)
Sodium: 134 mmol/L — ABNORMAL LOW (ref 135–145)
TOTAL PROTEIN: 5.2 g/dL — AB (ref 6.5–8.1)

## 2015-06-17 LAB — HEPATITIS PANEL, ACUTE
HCV Ab: <0.1 s/co ratio (ref 0.0–0.9)
HEP B C IGM: NEGATIVE
Hep A IgM: NEGATIVE
Hepatitis B Surface Ag: NEGATIVE

## 2015-06-17 LAB — CK
Total CK: 760 U/L — ABNORMAL HIGH (ref 49–397)
Total CK: 571 U/L — ABNORMAL HIGH (ref 49–397)

## 2015-06-17 LAB — MAGNESIUM: Magnesium: 1.9 mg/dL (ref 1.7–2.4)

## 2015-06-17 MED ORDER — PERFLUTREN LIPID MICROSPHERE
1.0000 mL | INTRAVENOUS | Status: AC | PRN
  Administered 2015-06-17: 5 mL via INTRAVENOUS
  Filled 2015-06-17: qty 10

## 2015-06-17 NOTE — Progress Notes (Signed)
Echocardiogram 2D Echocardiogram limited echo with Definity has been performed.  Nolon Rod 06/17/2015, 2:15 PM

## 2015-06-17 NOTE — Progress Notes (Signed)
[facility]                                                                              Patient Demographics  Gary [name], is a 65 y.o. male, DOB - 10-05-1950, ZOX:[id number]  Admit date - 06/14/2015   Admitting Physician Alberteen Sam, MD  Outpatient Primary MD for the patient is [name], NP  LOS - 2  days    Chief Complaint  Patient presents with  . Altered Mental Status       Brief HPI  Gary [name] is a 65 y.o. male with a past medical history significant for alcoholic cirrhosis, HTN who presented after unwitnessed fall, found down. Evidently, his wife is in [place] (he is able to sort of relate this), the patient was last seen about 3-4 days ago by neighbors. His sister had called her neighbor to check on him and patient was found face down on the floor of his home, disoriented, weak. EMS were called who found him awake, interactive, oriented only to person and place, slowly following commands, weak, dry mucous membranes, very bruised, jaundiced, incontinent of urine. In the ED, he was tachycardic, afebrile, saturating well on ambient air. Remained confused and jaundiced. Na 130, K 2.7, Cr. 1.1, TBili 13, AST/ALT 280/100, WBC 8K, Hgb 13, platetelets 71K (at baseline). CK 2000, mild troponin leak. Cards thought troponin leak was expected given found down and degree of CK elevation. CT imaging of the head, C-spine, chest abdomen and pelvis with contrast were unremarkable except for increased heterogeneity of the liver. Flat radiographs of both elbows were negative for fracture. Chest x-ray clear. Got 2L NS and ceftriaxone for UTI, and TRH were asked to evaluate for admission.     Assessment & Plan   Altered mental status: Improving today, more alert and awake and oriented. - Multifactorial, possibility of hepatic encephalopathy with cirrhosis, UTI, multiple metabolic abnormalities including hyponatremia, hypokalemia, rhabdomyolysis, ? EtOH  withdrawal - No focal neurological deficits noted, CT head negative, MRI brain negative, EEG consistent with a mild generalized nonspecific cerebral dysfunction/encephalopathy. No seizure activity noted. -  Ammonia level was elevated at 92, continue oral lactulose - TSH normal - UA positive for UTI, follow urine cultures, continue IV Rocephin - MCV 100.6, check B12, folate   Hyperbilirubinemia, jaundice: Likely due to liver cirrhosis, has macrocytic anemia, thrombocytopenia, hepatic encephalopathy/hyperammonemia - CT abdomen showed hepatic cirrhosis with degenerative nodules - Ultrasound showed severe hepatic steatosis, trace amount of perisplenic ascites - GI consulted, appreciate recommendations, placed on steroids for acute alcoholic hepatitis - Follow AFP  Essential hypertension, tachycardia - Normotensive at admission. -Restarted beta blocker due to tachycardia, possibly beta blocker withdrawals, EtOH withdrawal - 2-D echo showed EF of 55-60%, no wall motion abnormalities with grade 1 diastolic dysfunction, ? Inferior wall motion abnormality, recommending Definity echo.   History of alcohol abuse -  Continue CIWA protocol with ativan   Rhabdomyolysis: Likely due to immobility, found down -Continue IV fluid hydration, trend his CKs   Thrombocytopenia: Due to liver cirrhosis - follow counts   Hypokalemia, hypomagnesemia:  -Replaced  Hyponatremia: Improving likely due to hypovolemia  and dehydration   UTI: - follow urine culture, continue IV Rocephin  Elevated troponin - Possible troponin leak due to acute rhabdomyolysis - 2-D echo showed EF 55-60%, normal wall motion, no regional wall motion abnormalities, grade 1 diastolic dysfunction. Possible inferior wall motion abnormality, recommend limited study with Definity contrast   Code Status: Full code   Family Communication: No family member at the bedside  Disposition Plan:   Time Spent in minutes   25  minutes  Procedures  CT abd, head MRI brain  Consults  G I  DVT Prophylaxis  SCD's  Medications  Scheduled Meds: . cefTRIAXone (ROCEPHIN)  IV  1 g Intravenous Q24H  . folic acid  1 mg Oral Daily  . lactulose  30 g Oral TID  . metoprolol tartrate  12.5 mg Oral BID  . multivitamin with minerals  1 tablet Oral Daily  . pantoprazole  40 mg Oral Daily  . prednisoLONE  40 mg Oral Daily  . sodium chloride flush  3 mL Intravenous Q12H  . thiamine  100 mg Oral Daily   Continuous Infusions:   PRN Meds:.LORazepam **OR** LORazepam, oxyCODONE, senna-docusate   Antibiotics   Anti-infectives    Start     Dose/Rate Route Frequency Ordered Stop   06/15/15 0530  cefTRIAXone (ROCEPHIN) 1 g in dextrose 5 % 50 mL IVPB     1 g 100 mL/hr over 30 Minutes Intravenous Every 24 hours 06/15/15 0517 06/22/15 0529        Subjective:   Gary [name] was seen and examined today. Alert and awake, more oriented today. No chest pain, abdominal pain, shortness of breath, fevers or chills.    Objective:   Filed Vitals:   06/16/15 1149 06/16/15 1316 06/16/15 2015 06/17/15 0609  BP:  137/108 109/73 116/82  Pulse:  129 92 120  Temp:  97.9 F (36.6 C) 98.3 F (36.8 C) 97.9 F (36.6 C)  TempSrc:   Oral Oral  Resp:  18 18 16   Height:      Weight: 98.1 kg (216 lb 4.3 oz)     SpO2:  100% 96% 97%    Intake/Output Summary (Last 24 hours) at 06/17/15 1203 Last data filed at 06/17/15 0900  Gross per 24 hour  Intake   1920 ml  Output      0 ml  Net   1920 ml     Wt Readings from Last 3 Encounters:  06/16/15 98.1 kg (216 lb 4.3 oz)  04/05/15 83.915 kg (185 lb)  11/21/14 82.158 kg (181 lb 2 oz)     Exam  General:Alert and oriented 2, NAD, jaundiced  HEENT: icteric Sclera,   Neck: Supple, no JVD  CVS: S1 S2 clear, RRR  Respiratory: CTAB, no rhonchi  Abdomen: Soft, nontender, nondistended, + bowel sounds  Ext: no cyanosis clubbing or edema, tremulous   Neuro: strength 5/5 in  upper and lower extremities bilaterally   Skin:  No rashes  Psych: mental status better today  Data Reviewed:  I have personally reviewed following labs and imaging studies  Micro Results Recent Results (from the past 240 hour(s))  Urine culture     Status: None   Collection Time: 06/14/15 11:40 PM  Result Value Ref Range Status   Specimen Description URINE, RANDOM  Final   Special Requests NONE  Final   Culture NO GROWTH 1 DAY  Final   Report Status 06/16/2015 FINAL  Final    Radiology Reports Dg Chest 1 View  06/15/2015  CLINICAL DATA:  Found down at home. EXAM: CHEST 1 VIEW COMPARISON:  Chest radiograph April 05, 2015 FINDINGS: Cardiac silhouette is mildly enlarged, mediastinal silhouette is unremarkable for this low inspiratory portable examination with crowded vasculature markings. The lungs are clear without pleural effusions or focal consolidations. Bandlike density LEFT lung base. Stable LEFT apical pleural thickening. Trachea projects midline and there is no pneumothorax. Included soft tissue planes and osseous structures are non-suspicious. Old RIGHT posterior rib fractures. Old RIGHT mid clavicle fracture. Osteopenia. IMPRESSION: Mild cardiomegaly and LEFT lung base atelectasis. Electronically Signed   By: Awilda Metro M.D.   On: 06/15/2015 01:40   Dg Elbow Complete Left  06/15/2015  CLINICAL DATA:  Larey Seat at home. EXAM: LEFT ELBOW - COMPLETE 3+ VIEW COMPARISON:  None. FINDINGS: There is no evidence of fracture, dislocation, or joint effusion. There is no evidence of arthropathy or other focal bone abnormality. Soft tissues are unremarkable. IMPRESSION: Negative. Electronically Signed   By: Ellery Plunk M.D.   On: 06/15/2015 01:42   Dg Elbow Complete Right  06/15/2015  CLINICAL DATA:  Larey Seat at home EXAM: RIGHT ELBOW - COMPLETE 3+ VIEW COMPARISON:  None. FINDINGS: There is no evidence of fracture, dislocation, or joint effusion. There is no evidence of arthropathy or  other focal bone abnormality. Soft tissues are unremarkable. IMPRESSION: Negative. Electronically Signed   By: Ellery Plunk M.D.   On: 06/15/2015 01:42   Ct Head Wo Contrast  06/15/2015  CLINICAL DATA:  Found down, with bruising about the head. Concern for head or cervical spine injury. Initial encounter. EXAM: CT HEAD WITHOUT CONTRAST CT CERVICAL SPINE WITHOUT CONTRAST TECHNIQUE: Multidetector CT imaging of the head and cervical spine was performed following the standard protocol without intravenous contrast. Multiplanar CT image reconstructions of the cervical spine were also generated. COMPARISON:  None. FINDINGS: CT HEAD FINDINGS There is no evidence of acute infarction, mass lesion, or intra- or extra-axial hemorrhage on CT. Prominence of the ventricles and sulci reflects mild cortical volume loss. Mild cerebellar atrophy is noted. Mild periventricular and subcortical white matter change likely reflects small vessel ischemic microangiopathy. The brainstem and fourth ventricle are within normal limits. The basal ganglia are unremarkable in appearance. The cerebral hemispheres demonstrate grossly normal gray-white differentiation. No mass effect or midline shift is seen. There is no evidence of fracture; visualized osseous structures are unremarkable in appearance. The visualized portions of the orbits are within normal limits. Mucus retention cyst or polyp is noted at the left maxillary sinus. The remaining paranasal sinuses and mastoid air cells are well-aerated. Soft tissue swelling is noted overlying the left parietal calvarium and at the left vertex. CT CERVICAL SPINE FINDINGS There is no evidence of fracture or subluxation. Vertebral bodies demonstrate normal height and alignment. Multilevel disc space narrowing is noted along the lower cervical spine, with small anterior and posterior disc osteophyte complexes. Prevertebral soft tissues are within normal limits. The visualized neural foramina are  grossly unremarkable. The thyroid gland is unremarkable in appearance. No significant soft tissue abnormalities are seen. IMPRESSION: 1. No evidence of traumatic intracranial injury or fracture. 2. No evidence of fracture or subluxation along the cervical spine. 3. Soft tissue swelling overlying the left parietal calvarium and at the left vertex. 4. Mild cortical volume loss and scattered small vessel ischemic microangiopathy. 5. Mild degenerative change along the lower cervical spine. 6. Mucus retention cyst or polyp at the left maxillary sinus. Electronically Signed   By: Beryle Beams.D.  On: 06/15/2015 02:37   Ct Chest W Contrast  06/15/2015  CLINICAL DATA:  Found down with chest and abdominal bruising. Initial encounter. EXAM: CT CHEST, ABDOMEN, AND PELVIS WITH CONTRAST TECHNIQUE: Multidetector CT imaging of the chest, abdomen and pelvis was performed following the standard protocol during bolus administration of intravenous contrast. CONTRAST:  100 mL ISOVUE-300 IOPAMIDOL (ISOVUE-300) INJECTION 61% COMPARISON:  CT of the chest, abdomen and pelvis performed 04/05/2015 FINDINGS: CT CHEST Minimal bibasilar atelectasis is noted. The lungs are otherwise clear. No pleural effusion or pneumothorax is seen. No masses are identified. There is no evidence of pulmonary parenchymal contusion. Scattered coronary artery calcifications are seen. No pericardial effusion is identified. No mediastinal lymphadenopathy is seen. There is no evidence of venous hemorrhage. The great vessels are grossly unremarkable in appearance. Mild calcification is seen along the aortic arch. The visualized portions of thyroid gland are unremarkable. No axillary lymphadenopathy is seen. There is no evidence of significant soft tissue injury along the chest wall. No acute osseous abnormalities are identified. Chronic right-sided rib deformities are noted. CT ABDOMEN AND PELVIS No free air or free fluid is seen within the abdomen or pelvis.  There is no evidence of solid or hollow organ injury. There is a diffusely heterogeneous appearance to the liver, with multiple areas of decreased attenuation. Underlying mass cannot be excluded. The appearance raises question for hepatic cirrhosis and regenerative nodules. The gallbladder is within normal limits. The pancreas and adrenal glands are unremarkable. There is recanalization of the umbilical vein, extending to the vessels of the anterior abdominal wall. Scattered gastric and esophageal varices are seen. A tiny umbilical hernia is noted, containing only fat. The kidneys are unremarkable in appearance. There is no evidence of hydronephrosis. No renal or ureteral stones are seen. Mild nonspecific perinephric stranding is noted bilaterally. No free fluid is identified. The small bowel is unremarkable in appearance. The stomach is within normal limits. No acute vascular abnormalities are seen. Mild scattered calcification is seen along the abdominal aorta and its branches. The appendix is not definitely characterized; there is no evidence of appendicitis. Scattered diverticulosis is noted along the descending and sigmoid colon, without evidence of diverticulitis. The bladder is decompressed, with a Foley catheter in place. The prostate is borderline prominent. No inguinal lymphadenopathy is seen. No acute osseous abnormalities are identified. 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 IMPRESSION: 1. No evidence of traumatic injury to the chest, abdomen or pelvis. 2. Changes of hepatic cirrhosis, with underlying diffuse heterogeneity of the liver, concerning for degenerative nodules. Heterogeneity is more prominent than on the prior study. Underlying mass cannot be excluded. Dynamic liver protocol MRI or CT would be helpful for further evaluation. 3. Minimal bibasilar atelectasis noted.  Lungs otherwise clear. 4. Scattered coronary artery calcifications seen. 5. Recanalization of the umbilical vein,  extending to the vessels of the anterior abdominal wall. Scattered gastric and esophageal varices noted. 6. Tiny umbilical hernia, containing only fat. 7. Mild scattered calcification along the abdominal aorta and its branches. 8. Scattered diverticulosis along the descending and sigmoid colon, without evidence of diverticulitis. 9. 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 Electronically Signed   By: Roanna Raider M.D.   On: 06/15/2015 02:47   Ct Cervical Spine Wo Contrast  06/15/2015  CLINICAL DATA:  Found down, with bruising about the head. Concern for head or cervical spine injury. Initial encounter. EXAM: CT HEAD WITHOUT CONTRAST CT CERVICAL SPINE WITHOUT CONTRAST TECHNIQUE: Multidetector CT imaging of the head and  cervical spine was performed following the standard protocol without intravenous contrast. Multiplanar CT image reconstructions of the cervical spine were also generated. COMPARISON:  None. FINDINGS: CT HEAD FINDINGS There is no evidence of acute infarction, mass lesion, or intra- or extra-axial hemorrhage on CT. Prominence of the ventricles and sulci reflects mild cortical volume loss. Mild cerebellar atrophy is noted. Mild periventricular and subcortical white matter change likely reflects small vessel ischemic microangiopathy. The brainstem and fourth ventricle are within normal limits. The basal ganglia are unremarkable in appearance. The cerebral hemispheres demonstrate grossly normal gray-white differentiation. No mass effect or midline shift is seen. There is no evidence of fracture; visualized osseous structures are unremarkable in appearance. The visualized portions of the orbits are within normal limits. Mucus retention cyst or polyp is noted at the left maxillary sinus. The remaining paranasal sinuses and mastoid air cells are well-aerated. Soft tissue swelling is noted overlying the left parietal calvarium and at the left vertex. CT CERVICAL SPINE FINDINGS There is no evidence of fracture  or subluxation. Vertebral bodies demonstrate normal height and alignment. Multilevel disc space narrowing is noted along the lower cervical spine, with small anterior and posterior disc osteophyte complexes. Prevertebral soft tissues are within normal limits. The visualized neural foramina are grossly unremarkable. The thyroid gland is unremarkable in appearance. No significant soft tissue abnormalities are seen. IMPRESSION: 1. No evidence of traumatic intracranial injury or fracture. 2. No evidence of fracture or subluxation along the cervical spine. 3. Soft tissue swelling overlying the left parietal calvarium and at the left vertex. 4. Mild cortical volume loss and scattered small vessel ischemic microangiopathy. 5. Mild degenerative change along the lower cervical spine. 6. Mucus retention cyst or polyp at the left maxillary sinus. Electronically Signed   By: Roanna Raider M.D.   On: 06/15/2015 02:37   Mr Brain Wo Contrast  06/15/2015  CLINICAL DATA:  Chief complaint. Fall with confusion. Alcoholic cirrhosis. Encephalopathy. EXAM: MRI HEAD WITHOUT CONTRAST TECHNIQUE: Multiplanar, multiecho pulse sequences of the brain and surrounding structures were obtained without intravenous contrast. COMPARISON:  CT head earlier today. FINDINGS: No evidence for acute infarction, hemorrhage, mass lesion, hydrocephalus, or extra-axial fluid. Generalized atrophy. Mild T2 and FLAIR hyperintensities, nonspecific, likely chronic microvascular ischemic change. 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 Prominent Perivascular spaces. Flow voids are maintained. No chronic hemorrhage. T1 weighted images do not show characteristic features of hepatic encephalopathy. No midline abnormality. Extracranial soft tissues unremarkable, except for a LEFT parietal vertex scalp hematoma. IMPRESSION: No intracranial posttraumatic sequelae are evident. There  is advanced atrophy with chronic microvascular ischemic change. No specific features of hepatic encephalopathy are observed. Electronically Signed   By: Elsie Stain M.D.   On: 06/15/2015 15:56   US Abdomen Complete  06/16/2015  CLINICAL DATA:  65 year old male with hyperbilirubinemia. Alcohol abuse. Acute hepatitis. EXAM: ABDOMEN ULTRASOUND COMPLETE COMPARISON:  No priors.  CT of the abdomen and pelvis 06/15/2015. FINDINGS: Gallbladder: No gallstones or wall thickening visualized. No sonographic Murphy sign noted by sonographer. Common bile duct:  Could not be visualized. Liver: Heterogeneous in echotexture, which generally increased echogenicity, compatible with severe hepatic steatosis. No discrete mass confidently identified. No intrahepatic biliary ductal dilatation noted. IVC: No abnormality visualized. Pancreas: Visualized portion unremarkable. Spleen: Size and appearance within normal limits.  9.7 cm in length. Right Kidney: Length: 11.9 cm. Echogenicity within normal limits. No mass or hydronephrosis visualized. Left Kidney: Length: 12.0 cm.  Echogenicity within normal limits. No mass or hydronephrosis visualized. Abdominal aorta: No aneurysm visualized. Other findings: Trace amount of perisplenic ascites. IMPRESSION: 1. Severe hepatic steatosis. 2. Trace amount of perisplenic ascites. Electronically Signed   By: Trudie Reed M.D.   On: 06/16/2015 10:04   Ct Abdomen Pelvis W Contrast  06/15/2015  CLINICAL DATA:  Found down with chest and abdominal bruising. Initial encounter. EXAM: CT CHEST, ABDOMEN, AND PELVIS WITH CONTRAST TECHNIQUE: Multidetector CT imaging of the chest, abdomen and pelvis was performed following the standard protocol during bolus administration of intravenous contrast. CONTRAST:  100 mL ISOVUE-300 IOPAMIDOL (ISOVUE-300) INJECTION 61% COMPARISON:  CT of the chest, abdomen and pelvis performed 04/05/2015 FINDINGS: CT CHEST Minimal bibasilar atelectasis is noted. The lungs are  otherwise clear. No pleural effusion or pneumothorax is seen. No masses are identified. There is no evidence of pulmonary parenchymal contusion. Scattered coronary artery calcifications are seen. No pericardial effusion is identified. No mediastinal lymphadenopathy is seen. There is no evidence of venous hemorrhage. The great vessels are grossly unremarkable in appearance. Mild calcification is seen along the aortic arch. The visualized portions of thyroid gland are unremarkable. No axillary lymphadenopathy is seen. There is no evidence of significant soft tissue injury along the chest wall. No acute osseous abnormalities are identified. Chronic right-sided rib deformities are noted. CT ABDOMEN AND PELVIS No free air or free fluid is seen within the abdomen or pelvis. There is no evidence of solid or hollow organ injury. There is a diffusely heterogeneous appearance to the liver, with multiple areas of decreased attenuation. Underlying mass cannot be excluded. The appearance raises question for hepatic cirrhosis and regenerative nodules. The gallbladder is within normal limits. The pancreas and adrenal glands are unremarkable. There is recanalization of the umbilical vein, extending to the vessels of the anterior abdominal wall. Scattered gastric and esophageal varices are seen. A tiny umbilical hernia is noted, containing only fat. The kidneys are unremarkable in appearance. There is no evidence of hydronephrosis. No renal or ureteral stones are seen. Mild nonspecific perinephric stranding is noted bilaterally. No free fluid is identified. The small bowel is unremarkable in appearance. The stomach is within normal limits. No acute vascular abnormalities are seen. Mild scattered calcification is seen along the abdominal aorta and its branches. The appendix is not definitely characterized; there is no evidence of appendicitis. Scattered diverticulosis is noted along the descending and sigmoid colon, without evidence  of diverticulitis. The bladder is decompressed, with a Foley catheter in place. The prostate is borderline prominent. No inguinal lymphadenopathy is seen. No acute osseous abnormalities are identified. 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 IMPRESSION: 1. No evidence of traumatic injury to the chest, abdomen or pelvis. 2. Changes of hepatic cirrhosis, with underlying diffuse heterogeneity of the liver, concerning for degenerative nodules. Heterogeneity is more prominent than on the prior study. Underlying mass cannot be excluded. Dynamic liver protocol MRI or CT would be helpful for further evaluation. 3. Minimal bibasilar atelectasis noted.  Lungs otherwise clear. 4. Scattered coronary artery calcifications seen. 5. Recanalization of the umbilical vein, extending to the vessels of the anterior abdominal wall. Scattered gastric and esophageal varices noted. 6. Tiny umbilical hernia, containing only fat. 7. Mild scattered calcification along the abdominal aorta and its branches. 8. Scattered diverticulosis along the descending and sigmoid colon, without evidence of diverticulitis. 9. 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 Electronically Signed   By: Roanna Raider M.D.   On: 06/15/2015 02:47  CBC  Recent Labs Lab 06/14/15 2237 06/15/15 0621 06/16/15 0538 06/17/15 0557  WBC 8.0 7.0 6.0 6.0  HGB 13.2 11.7* 11.6* 11.9*  HCT 36.5* 32.6* 33.6* 33.8*  PLT 71* 60* 64* 71*  MCV 99.7 100.6* 99.4 99.7  MCH 36.1* 36.1* 34.3* 35.1*  MCHC 36.2* 35.9 34.5 35.2  RDW 15.0 15.4 16.2* 16.9*  LYMPHSABS 0.5*  --   --   --   MONOABS 1.2*  --   --   --   EOSABS 0.0  --   --   --   BASOSABS 0.0  --   --   --     Chemistries   Recent Labs Lab 06/14/15 2237 06/14/15 2330 06/15/15 0621 06/16/15 0538 06/16/15 0957 06/16/15 1936 06/17/15 0557  NA 130*  --  135 135  --   --  134*  K 2.7*  --  3.0* 2.7*  --  3.1* 3.9  CL 92*  --  98* 102  --   --  104  CO2 21*  --  21* 23  --   --  21*  GLUCOSE  119*  --  101* 103*  --   --  134*  BUN 18  --  14 11  --   --  8  CREATININE 1.10  --  1.03 0.74  --   --  0.72  CALCIUM 8.7*  --  8.0* 8.0*  --   --  8.3*  MG  --  1.9  --   --  1.5*  --  1.9  AST 284*  --  274* 247*  --   --  216*  ALT 102*  --  92* 85*  --   --  86*  ALKPHOS 148*  --  122 126  --   --  125  BILITOT 13.6*  --  13.1* 13.1*  --   --  13.9*   ------------------------------------------------------------------------------------------------------------------ estimated creatinine clearance is 108.1 mL/min (by C-G formula based on Cr of 0.72). ------------------------------------------------------------------------------------------------------------------ No results for input(s): HGBA1C in the last 72 hours. ------------------------------------------------------------------------------------------------------------------ No results for input(s): CHOL, HDL, LDLCALC, TRIG, CHOLHDL, LDLDIRECT in the last 72 hours. ------------------------------------------------------------------------------------------------------------------  Recent Labs  06/15/15 0621  TSH 0.916   ------------------------------------------------------------------------------------------------------------------  Recent Labs  06/15/15 0621 06/15/15 1704  VITAMINB12  --  1494*  FOLATE 6.7  --   FERRITIN  --  1542*  TIBC  --  106*  IRON  --  100  RETICCTPCT  --  3.6*    Coagulation profile  Recent Labs Lab 06/16/15 0538  INR 2.19*    No results for input(s): DDIMER in the last 72 hours.  Cardiac Enzymes  Recent Labs Lab 06/15/15 0621 06/15/15 1126 06/15/15 1704  TROPONINI 0.10* 0.11* 0.08*   ------------------------------------------------------------------------------------------------------------------ Invalid input(s): POCBNP  No results for input(s): GLUCAP in the last 72 hours.   RAI,RIPUDEEP M.D. [facility] 06/17/2015, 12:03 PM  Pager: [phone] Between 7am to 7pm  - call Pager - [phone]  After 7pm go to [email or web address] - password [email or web address]  Call night coverage person covering after 7pm

## 2015-06-17 NOTE — Progress Notes (Signed)
Progress Note   Subjective  No overnight events Pt denies pain No fevers   Objective   Vital signs in last 24 hours: Temp:  [97.9 F (36.6 C)-98.3 F (36.8 C)] 97.9 F (36.6 C) (04/16 0609) Pulse Rate:  [92-120] 120 (04/16 0609) Resp:  [16-18] 16 (04/16 0609) BP: (109-116)/(73-82) 116/82 mmHg (04/16 0609) SpO2:  [96 %-97 %] 97 % (04/16 0609) Last BM Date:  (PTA) Gen: asleep but wakes easily, more alert today, NAD HEENT: icteric sclera, op clear/dry CV: tachy, regular Pulm: decreased BS b/l bases Abd: soft though distended with ascites, NT, +BS throughout Ext: no c/c, trace to 1+ LE edema Neuro: nonfocal Skin: jaundiced  Intake/Output from previous day: 04/15 0701 - 04/16 0700 In: 1680 [P.O.:580; IV Piggyback:50] Out: 1 [Stool:1] Intake/Output this shift: Total I/O In: 240 [P.O.:240] Out: -   Lab Results:  Recent Labs  06/15/15 0621 06/16/15 0538 06/17/15 0557  WBC 7.0 6.0 6.0  HGB 11.7* 11.6* 11.9*  HCT 32.6* 33.6* 33.8*  PLT 60* 64* 71*   BMET  Recent Labs  06/15/15 0621 06/16/15 0538 06/16/15 1936 06/17/15 0557  NA 135 135  --  134*  K 3.0* 2.7* 3.1* 3.9  CL 98* 102  --  104  CO2 21* 23  --  21*  GLUCOSE 101* 103*  --  134*  BUN 14 11  --  8  CREATININE 1.03 0.74  --  0.72  CALCIUM 8.0* 8.0*  --  8.3*   LFT  Recent Labs  06/17/15 0557  PROT 5.2*  ALBUMIN 2.0*  AST 216*  ALT 86*  ALKPHOS 125  BILITOT 13.9*   PT/INR  Recent Labs  06/16/15 0538  LABPROT 24.2*  INR 2.19*    Studies/Results: Mr Brain Wo Contrast  06/15/2015  CLINICAL DATA:  Chief complaint. Fall with confusion. Alcoholic cirrhosis. Encephalopathy. EXAM: MRI HEAD WITHOUT CONTRAST TECHNIQUE: Multiplanar, multiecho pulse sequences of the brain and surrounding structures were obtained without intravenous contrast. COMPARISON:  CT head earlier today. FINDINGS: No evidence for acute infarction, hemorrhage, mass lesion, hydrocephalus, or extra-axial fluid.  Generalized atrophy. Mild T2 and FLAIR hyperintensities, nonspecific, likely chronic microvascular ischemic change. 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 Prominent Perivascular spaces. Flow voids are maintained. No chronic hemorrhage. T1 weighted images do not show characteristic features of hepatic encephalopathy. No midline abnormality. Extracranial soft tissues unremarkable, except for a LEFT parietal vertex scalp hematoma. IMPRESSION: No intracranial posttraumatic sequelae are evident. There is advanced atrophy with chronic microvascular ischemic change. No specific features of hepatic encephalopathy are observed. Electronically Signed   By: Elsie Stain [name].D.   On: 06/15/2015 15:56   US Abdomen Complete  06/16/2015  CLINICAL DATA:  65 year old male with hyperbilirubinemia. Alcohol abuse. Acute hepatitis. EXAM: ABDOMEN ULTRASOUND COMPLETE COMPARISON:  No priors.  CT of the abdomen and pelvis 06/15/2015. FINDINGS: Gallbladder: No gallstones or wall thickening visualized. No sonographic Murphy sign noted by sonographer. Common bile duct:  Could not be visualized. Liver: Heterogeneous in echotexture, which generally increased echogenicity, compatible with severe hepatic steatosis. No discrete mass confidently identified. No intrahepatic biliary ductal dilatation noted. IVC: No abnormality visualized. Pancreas: Visualized portion unremarkable. Spleen: Size and appearance within normal limits.  9.7 cm in length. Right Kidney: Length: 11.9 cm. Echogenicity within normal limits. No mass or hydronephrosis visualized. Left Kidney: Length: 12.0 cm. Echogenicity within normal limits. No mass or hydronephrosis visualized. Abdominal aorta: No  aneurysm visualized. Other findings: Trace amount of perisplenic ascites. IMPRESSION: 1. Severe hepatic steatosis. 2. Trace amount of perisplenic ascites. Electronically Signed   By: Trudie Reed [name].D.   On: 06/16/2015 10:04       Assessment / Plan:   65 year old male with decompensated alcoholic cirrhosis admitted after being found down now with alcoholic hepatitis, hepatic encephalopathy, UTI, electrolyte abnormalities and rhabdomyolysis  1. Alcohol hepatitis in decompensated cirrhosis (DF 69 at consult) -- very poor overall prognosis. Continue prednisolone 40 mg daily targeting 28 days of therapy with two-week taper thereafter. Alcohol abstinence though likely unlikely given his history --Continue lactulose targeting 3-4 soft but formed stools daily. Could consider adding rifaximin but he has responded to lactulose and this medication will likely be caused prohibitive after discharge --Monitor liver enzymes and INR --Degenerative nodules by CT. Consider MRI liver protocol with contrast for further characterization. AFP pending. --Low sodium diet --Eventual EGD for variceal screening is recommended, on beta blocker  2. UTI -- on antibiotics  3.  ETOH abuse -- see #1, on CIWA protocol  4. Rhabdomyolysis -- IV hydration, improving CK  GI will be available, call with questions  Principal Problem:   Altered mental status Active Problems:   Hyperbilirubinemia   Thrombocytopenia (HCC)   Hypokalemia   Hepatic cirrhosis (HCC)   Elevated troponin   Elevated CK   Fall     LOS: 2 days   Gary [name]  06/17/2015, 2:04 PM

## 2015-06-18 DIAGNOSIS — R748 Abnormal levels of other serum enzymes: Secondary | ICD-10-CM

## 2015-06-18 DIAGNOSIS — I471 Supraventricular tachycardia: Secondary | ICD-10-CM

## 2015-06-18 DIAGNOSIS — R7989 Other specified abnormal findings of blood chemistry: Secondary | ICD-10-CM

## 2015-06-18 DIAGNOSIS — I2510 Atherosclerotic heart disease of native coronary artery without angina pectoris: Secondary | ICD-10-CM

## 2015-06-18 LAB — CBC
HCT: 37.9 % — ABNORMAL LOW (ref 39.0–52.0)
HEMOGLOBIN: 13.5 g/dL (ref 13.0–17.0)
MCH: 36.7 pg — ABNORMAL HIGH (ref 26.0–34.0)
MCHC: 35.6 g/dL (ref 30.0–36.0)
MCV: 103 fL — ABNORMAL HIGH (ref 78.0–100.0)
Platelets: 80 10*3/uL — ABNORMAL LOW (ref 150–400)
RBC: 3.68 MIL/uL — AB (ref 4.22–5.81)
RDW: 18.3 % — ABNORMAL HIGH (ref 11.5–15.5)
WBC: 8.6 10*3/uL (ref 4.0–10.5)

## 2015-06-18 LAB — COMPREHENSIVE METABOLIC PANEL
ALBUMIN: 2.3 g/dL — AB (ref 3.5–5.0)
ALK PHOS: 154 U/L — AB (ref 38–126)
ALT: 93 U/L — ABNORMAL HIGH (ref 17–63)
ANION GAP: 10 (ref 5–15)
AST: 190 U/L — AB (ref 15–41)
BILIRUBIN TOTAL: 11.9 mg/dL — AB (ref 0.3–1.2)
BUN: 8 mg/dL (ref 6–20)
CALCIUM: 8.3 mg/dL — AB (ref 8.9–10.3)
CO2: 22 mmol/L (ref 22–32)
Chloride: 103 mmol/L (ref 101–111)
Creatinine, Ser: 0.73 mg/dL (ref 0.61–1.24)
GFR calc Af Amer: >60 mL/min (ref >60)
GFR calc non Af Amer: >60 mL/min (ref >60)
GLUCOSE: 99 mg/dL (ref 65–99)
Potassium: 3.8 mmol/L (ref 3.5–5.1)
SODIUM: 135 mmol/L (ref 135–145)
TOTAL PROTEIN: 5.8 g/dL — AB (ref 6.5–8.1)

## 2015-06-18 LAB — PROTIME-INR
INR: 1.98 — ABNORMAL HIGH (ref 0.00–1.49)
Prothrombin Time: 22.4 seconds — ABNORMAL HIGH (ref 11.6–15.2)

## 2015-06-18 LAB — AFP TUMOR MARKER: AFP TUMOR MARKER: 2.4 ng/mL (ref 0.0–8.3)

## 2015-06-18 LAB — CK
CK TOTAL: 415 U/L — AB (ref 49–397)
CK TOTAL: 378 U/L (ref 49–397)

## 2015-06-18 MED ORDER — LISINOPRIL 20 MG PO TABS
20.0000 mg | ORAL_TABLET | Freq: Every day | ORAL | Status: DC
  Administered 2015-06-18 – 2015-06-20 (×2): 20 mg via ORAL
  Filled 2015-06-18 (×2): qty 1

## 2015-06-18 MED ORDER — HYPROMELLOSE (GONIOSCOPIC) 2.5 % OP SOLN
1.0000 [drp] | Freq: Four times a day (QID) | OPHTHALMIC | Status: DC | PRN
  Administered 2015-06-18: 1 [drp] via OPHTHALMIC
  Filled 2015-06-18: qty 15

## 2015-06-18 NOTE — Clinical Social Work Note (Signed)
Clinical Social Work Assessment  Patient Details  Name: Gary [name] MRN: [id number] Date of Birth: Apr 08, 1950  Date of referral:  06/18/15               Reason for consult:  Facility Placement                Permission sought to share information with:  Facility Medical sales representative, Family Supports Permission granted to share information::  Yes, Verbal Permission Granted  Name::     Gary [name]  Agency::  [facility] SN[facility]  Relationship::  Sister  Contact Information:  [phone]  Housing/Transportation Living arrangements for the past 2 months:  Apartment Source of Information:  Other (Comment Required) (Sister) Patient Interpreter Needed:  None Criminal Activity/Legal Involvement Pertinent to Current Situation/Hospitalization:  No - Comment as needed Significant Relationships:  Spouse, Siblings Lives with:  Self Do you feel safe going back to the place where you live?  No Need for family participation in patient care:  Yes (Comment)  Care giving concerns:  CSW received referral for possible SNF placement at time of discharge. Patient is disoriented. CSW spoke with patient's sister Gary [name] regarding PT recommendation of SNF placement at time of discharge. Per patient's sister, patient's wife is currently in [place]. Patient's sister lives out of town as well. Patient's sister expressed understanding of PT recommendation and is agreeable to SNF placement at time of discharge. CSW to continue to follow and assist with discharge planning needs.   Social Worker assessment / plan:  CSW spoke with patient's sister concerning possibility of patient completing rehab at [facility] before returning home.  Employment status:  Retired Network engineer Care PT Recommendations:  Skilled Nursing Facility Information / Referral to community resources:  Skilled Nursing Facility  Patient/Family's Response to care:  Patient's sister recognizes need for rehab before returning home and is  agreeable to a SNF in [place]. Patient's sister reported that patient has been to a facility before ([facility]). She believes patient now has Medicare since he just turned 65. Patient's sister is willing to have SNF paperwork faxed to her to sign patient in (fax: [phone]).  Patient/Family's Understanding of and Emotional Response to Diagnosis, Current Treatment, and Prognosis:  Patient is realistic regarding therapy needs. No questions/concerns about plan or treatment.    Emotional Assessment Appearance:  Appears stated age Attitude/Demeanor/Rapport:  Unable to Assess Affect (typically observed):  Unable to Assess Orientation:  Oriented to Self, Oriented to Place, Oriented to Situation Alcohol / Substance use:  Alcohol Use Psych involvement (Current and /or in the community):  No (Comment)  Discharge Needs  Concerns to be addressed:  Care Coordination Readmission within the last 30 days:  No Current discharge risk:  None Barriers to Discharge:  Continued Medical Work up   [employer], LCSWA 06/18/2015, 12:02 PM

## 2015-06-18 NOTE — Plan of Care (Signed)
Problem: Acute Rehab PT Goals(only PT should resolve) Goal: Pt Will Ambulate On level surfaces

## 2015-06-18 NOTE — Clinical Documentation Improvement (Signed)
Hospitalist  Can the diagnosis of CHF be further specified?    Acuity - Acute, Chronic, Acute on Chronic   Type - Systolic, Diastolic, Systolic and Diastolic  Other  Clinically Undetermined   Document any associated diagnoses/conditions   Supporting Information: History of CHF per 4/14 H&P.   Please exercise your independent, professional judgment when responding. A specific answer is not anticipated or expected.   Thank Sabino Donovan [facility] Information [facility] [place] [phone]

## 2015-06-18 NOTE — Consult Note (Signed)
CARDIOLOGY CONSULT NOTE   Patient ID: Gary [name] MRN: [id number], DOB/AGE: Oct 17, 1950   Admit date: 06/14/2015 Date of Consult: 06/18/2015   Primary Physician: [name], NP Primary Cardiologist: new - Dr. Royann Shivers  Pt. Profile  Gary [name] is a frail 65 yo male with PMH of HTN, HLD, thrombocytopenia, EtOH abuse and cirrhosis presented to the hospital after being found down for more than day, had significantly elevated total CK, elevated ammonia level felt to be alcoholic hepatitis, echo showed wall motion abnormality with LV dysfunction. Cardiology consult for abnormal echo.  Problem List  Past Medical History  Diagnosis Date  . Hypertension   . Arthritis   . Alcohol abuse   . CHF (congestive heart failure) (HCC)   . Elevated liver enzymes   . Alcoholic cirrhosis of liver without ascites (HCC)   . Thrombocytopenia (HCC)   . Acute hepatitis   . Vitamin D deficiency   . Hypercholesterolemia   . Macrocytic anemia   . Tremors of nervous system     Past Surgical History  Procedure Laterality Date  . Tonsillectomy    . Wisdom tooth extraction       Allergies  No Known Allergies  HPI   Gary [name] is a frail 65 yo male with PMH of HTN, HLD, thrombocytopenia, EtOH abuse and cirrhosis. He has no prior cardiac history. He denies any previous cardiac workup. Her last echocardiogram on 11/12/2014 showed EF 55-60%, mild MR, PA peak pressure 34 mmHg. According to the patient, he has cut back on drinking for the past several month period. He was previously admitted in February 2017 after tripping over a history and fell on his face. He says for the past several month, occasionally he would feel his heart rate started racing, and later stops by itself. Otherwise he denies any significant chest discomfort. He has pending right hip replacement surgery and assess this interfere with his ambulatory ability.   He was admitted to [facility] on 06/15/2015 after his neighbor found  him down on the floor facing down. Apparently his wife is Mamie Levers, the patient was last seen by his neighbor 3-4 days ago. His neighbor checked on him on the day of admission and found him down the floor disoriented and had urinary incontinence. Per patient, he was walking through a tight space under the staircase and hit his head and that he thinks he was on the floor for a day. EMS was called and found him awake, interactive and oriented to only place and person. He was also jaundiced. He was slow to follow command and continued to be so until this time. He was also tachycardic, however afebrile. Sodium 130, potassium 2.5. Both AST and ALT were elevated. Total CK was 2800. Troponin was mildly elevated at 0.10 which is not surprising given potential rhabdomyolysis. TSH was normal. Ammonia was 92. CT of head and cervical neck was negative for fracture. CT of chest with contrast obtained on 4/14 showed changes of hepatic cirrhosis with underlying diffuse heterogenicity of the liver concerning for degenerative nodules, scattered coronary artery calcification, scattered gastric and esophageal varices noted. Urinalysis was positive for nitrite. He is not complaining of ipsilateral weakness but does have significant generalized weakness and fatigue. EEG consistent with nonspecific cerebral dysfunction. GI was consulted for jaundice, and feels patient likely suffers from alcoholic hepatitis. He was treated with prednisone. GI recommended MRI of the liver to rule out HCC and eventual EGD to assess for varices both of  which can be obtained as outpatient.  Echocardiogram was obtained on 06/16/2015 which showed EF 55-60%, possible inferior wall motion abnormality, grade 1 diastolic dysfunction. Recommended limited echo with definity contrast to evaluate wall motion further. Repeat echo was performed on 4/16 which showed EF 40-45%, akinesis of entire inferolateral myocardium, possible hypokinesis of entire inferior  myocardium, RV systolic function moderately reduced. Of note, based on telemetry, patient also appears to have episodic PAT during this hospitalization. Cardiology has been consulted for abnormal echo.    Inpatient Medications  . folic acid  1 mg Oral Daily  . lactulose  30 g Oral TID  . lisinopril  20 mg Oral Daily  . metoprolol tartrate  12.5 mg Oral BID  . multivitamin with minerals  1 tablet Oral Daily  . pantoprazole  40 mg Oral Daily  . prednisoLONE  40 mg Oral Daily  . sodium chloride flush  3 mL Intravenous Q12H  . thiamine  100 mg Oral Daily    Family History Family History  Problem Relation Age of Onset  . Heart failure Father   . Colon cancer Neg Hx   . Colon polyps Neg Hx   . Diabetes Neg Hx   . Kidney disease Neg Hx   . Gallbladder disease Neg Hx   . Esophageal cancer Neg Hx   . Alcohol abuse Father   . COPD Mother   . Heart attack Father      Social History Social History   Social History  . Marital Status: Divorced    Spouse Name: N/A  . Number of Children: 2  . Years of Education: N/A   Occupational History  . Retired    Social History Main Topics  . Smoking status: Never Smoker   . Smokeless tobacco: Never Used  . Alcohol Use: 4.8 oz/week    7 Cans of beer, 1 Shots of liquor per week     Comment: Pt stated does not drink now - 03/16/2014         04/05/2015  " i  still drink 2 beers every date  "  . Drug Use: No  . Sexual Activity: Yes   Other Topics Concern  . Not on file   Social History Narrative     Review of Systems  General:  No chills, fever, night sweats or weight changes.  Cardiovascular:  No chest pain, dyspnea on exertion, edema, orthopnea, paroxysmal nocturnal dyspnea. +palpitations Dermatological: No rash, lesions/masses Respiratory: No cough, dyspnea Urologic: No hematuria, dysuria Abdominal:   No nausea, vomiting, diarrhea, bright red blood per rectum, melena, or hematemesis Neurologic:  No visual changes. AMS and  weakness All other systems reviewed and are otherwise negative except as noted above.  Physical Exam  Blood pressure 133/90, pulse 85, temperature 97.9 F (36.6 C), temperature source Oral, resp. rate 16, height 5\' 10"  (1.778 m), weight 216 lb 4.3 oz (98.1 kg), SpO2 99 %.  General: Drowsy, slow to respond to question Psych: flat Neuro: Alert and oriented. Moves all extremities spontaneously. HEENT: Normal  Neck: Supple without bruits or JVD. Lungs:  Resp regular and unlabored, anterior exam CTA. Heart: RRR no s3, s4, or murmurs. Abdomen: Soft, non-tender, non-distended, BS + x 4.  Extremities: No clubbing, cyanosis or edema. DP/PT/Radials 2+ and equal bilaterally.  Labs   Recent Labs  06/15/15 1704  06/16/15 1855 06/17/15 0557 06/17/15 1646 06/18/15 0530  CKTOTAL 1846*  < > 1099* 760* 571* 415*  TROPONINI 0.08*  --   --   --   --   --   < > =  values in this interval not displayed. Lab Results  Component Value Date   WBC 8.6 06/18/2015   HGB 13.5 06/18/2015   HCT 37.9* 06/18/2015   MCV 103.0* 06/18/2015   PLT 80* 06/18/2015     Recent Labs Lab 06/18/15 0530  NA 135  K 3.8  CL 103  CO2 22  BUN 8  CREATININE 0.73  CALCIUM 8.3*  PROT 5.8*  BILITOT 11.9*  ALKPHOS 154*  ALT 93*  AST 190*  GLUCOSE 99    Radiology/Studies  Dg Chest 1 View  06/15/2015  CLINICAL DATA:  Found down at home. EXAM: CHEST 1 VIEW COMPARISON:  Chest radiograph April 05, 2015 FINDINGS: Cardiac silhouette is mildly enlarged, mediastinal silhouette is unremarkable for this low inspiratory portable examination with crowded vasculature markings. The lungs are clear without pleural effusions or focal consolidations. Bandlike density LEFT lung base. Stable LEFT apical pleural thickening. Trachea projects midline and there is no pneumothorax. Included soft tissue planes and osseous structures are non-suspicious. Old RIGHT posterior rib fractures. Old RIGHT mid clavicle fracture. Osteopenia.  IMPRESSION: Mild cardiomegaly and LEFT lung base atelectasis. Electronically Signed   By: Awilda Metro M.D.   On: 06/15/2015 01:40   Dg Elbow Complete Left  06/15/2015  CLINICAL DATA:  Larey Seat at home. EXAM: LEFT ELBOW - COMPLETE 3+ VIEW COMPARISON:  None. FINDINGS: There is no evidence of fracture, dislocation, or joint effusion. There is no evidence of arthropathy or other focal bone abnormality. Soft tissues are unremarkable. IMPRESSION: Negative. Electronically Signed   By: Ellery Plunk M.D.   On: 06/15/2015 01:42   Dg Elbow Complete Right  06/15/2015  CLINICAL DATA:  Larey Seat at home EXAM: RIGHT ELBOW - COMPLETE 3+ VIEW COMPARISON:  None. FINDINGS: There is no evidence of fracture, dislocation, or joint effusion. There is no evidence of arthropathy or other focal bone abnormality. Soft tissues are unremarkable. IMPRESSION: Negative. Electronically Signed   By: Ellery Plunk M.D.   On: 06/15/2015 01:42   Ct Head Wo Contrast  06/15/2015  CLINICAL DATA:  Found down, with bruising about the head. Concern for head or cervical spine injury. Initial encounter. EXAM: CT HEAD WITHOUT CONTRAST CT CERVICAL SPINE WITHOUT CONTRAST TECHNIQUE: Multidetector CT imaging of the head and cervical spine was performed following the standard protocol without intravenous contrast. Multiplanar CT image reconstructions of the cervical spine were also generated. COMPARISON:  None. FINDINGS: CT HEAD FINDINGS There is no evidence of acute infarction, mass lesion, or intra- or extra-axial hemorrhage on CT. Prominence of the ventricles and sulci reflects mild cortical volume loss. Mild cerebellar atrophy is noted. Mild periventricular and subcortical white matter change likely reflects small vessel ischemic microangiopathy. The brainstem and fourth ventricle are within normal limits. The basal ganglia are unremarkable in appearance. The cerebral hemispheres demonstrate grossly normal gray-white differentiation. No mass  effect or midline shift is seen. There is no evidence of fracture; visualized osseous structures are unremarkable in appearance. The visualized portions of the orbits are within normal limits. Mucus retention cyst or polyp is noted at the left maxillary sinus. The remaining paranasal sinuses and mastoid air cells are well-aerated. Soft tissue swelling is noted overlying the left parietal calvarium and at the left vertex. CT CERVICAL SPINE FINDINGS There is no evidence of fracture or subluxation. Vertebral bodies demonstrate normal height and alignment. Multilevel disc space narrowing is noted along the lower cervical spine, with small anterior and posterior disc osteophyte complexes. Prevertebral soft tissues are within normal limits. The visualized  neural foramina are grossly unremarkable. The thyroid gland is unremarkable in appearance. No significant soft tissue abnormalities are seen. IMPRESSION: 1. No evidence of traumatic intracranial injury or fracture. 2. No evidence of fracture or subluxation along the cervical spine. 3. Soft tissue swelling overlying the left parietal calvarium and at the left vertex. 4. Mild cortical volume loss and scattered small vessel ischemic microangiopathy. 5. Mild degenerative change along the lower cervical spine. 6. Mucus retention cyst or polyp at the left maxillary sinus. Electronically Signed   By: Roanna Raider M.D.   On: 06/15/2015 02:37   Ct Chest W Contrast  06/15/2015  CLINICAL DATA:  Found down with chest and abdominal bruising. Initial encounter. EXAM: CT CHEST, ABDOMEN, AND PELVIS WITH CONTRAST TECHNIQUE: Multidetector CT imaging of the chest, abdomen and pelvis was performed following the standard protocol during bolus administration of intravenous contrast. CONTRAST:  100 mL ISOVUE-300 IOPAMIDOL (ISOVUE-300) INJECTION 61% COMPARISON:  CT of the chest, abdomen and pelvis performed 04/05/2015 FINDINGS: CT CHEST Minimal bibasilar atelectasis is noted. The lungs are  otherwise clear. No pleural effusion or pneumothorax is seen. No masses are identified. There is no evidence of pulmonary parenchymal contusion. Scattered coronary artery calcifications are seen. No pericardial effusion is identified. No mediastinal lymphadenopathy is seen. There is no evidence of venous hemorrhage. The great vessels are grossly unremarkable in appearance. Mild calcification is seen along the aortic arch. The visualized portions of thyroid gland are unremarkable. No axillary lymphadenopathy is seen. There is no evidence of significant soft tissue injury along the chest wall. No acute osseous abnormalities are identified. Chronic right-sided rib deformities are noted. CT ABDOMEN AND PELVIS No free air or free fluid is seen within the abdomen or pelvis. There is no evidence of solid or hollow organ injury. There is a diffusely heterogeneous appearance to the liver, with multiple areas of decreased attenuation. Underlying mass cannot be excluded. The appearance raises question for hepatic cirrhosis and regenerative nodules. The gallbladder is within normal limits. The pancreas and adrenal glands are unremarkable. There is recanalization of the umbilical vein, extending to the vessels of the anterior abdominal wall. Scattered gastric and esophageal varices are seen. A tiny umbilical hernia is noted, containing only fat. The kidneys are unremarkable in appearance. There is no evidence of hydronephrosis. No renal or ureteral stones are seen. Mild nonspecific perinephric stranding is noted bilaterally. No free fluid is identified. The small bowel is unremarkable in appearance. The stomach is within normal limits. No acute vascular abnormalities are seen. Mild scattered calcification is seen along the abdominal aorta and its branches. The appendix is not definitely characterized; there is no evidence of appendicitis. Scattered diverticulosis is noted along the descending and sigmoid colon, without evidence  of diverticulitis. The bladder is decompressed, with a Foley catheter in place. The prostate is borderline prominent. No inguinal lymphadenopathy is seen. No acute osseous abnormalities are identified. 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 IMPRESSION: 1. No evidence of traumatic injury to the chest, abdomen or pelvis. 2. Changes of hepatic cirrhosis, with underlying diffuse heterogeneity of the liver, concerning for degenerative nodules. Heterogeneity is more prominent than on the prior study. Underlying mass cannot be excluded. Dynamic liver protocol MRI or CT would be helpful for further evaluation. 3. Minimal bibasilar atelectasis noted.  Lungs otherwise clear. 4. Scattered coronary artery calcifications seen. 5. Recanalization of the umbilical vein, extending to the vessels of the anterior abdominal wall. Scattered gastric and esophageal varices noted. 6. Tiny umbilical  hernia, containing only fat. 7. Mild scattered calcification along the abdominal aorta and its branches. 8. Scattered diverticulosis along the descending and sigmoid colon, without evidence of diverticulitis. 9. 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 Electronically Signed   By: Roanna Raider M.D.   On: 06/15/2015 02:47   Ct Cervical Spine Wo Contrast  06/15/2015  CLINICAL DATA:  Found down, with bruising about the head. Concern for head or cervical spine injury. Initial encounter. EXAM: CT HEAD WITHOUT CONTRAST CT CERVICAL SPINE WITHOUT CONTRAST TECHNIQUE: Multidetector CT imaging of the head and cervical spine was performed following the standard protocol without intravenous contrast. Multiplanar CT image reconstructions of the cervical spine were also generated. COMPARISON:  None. FINDINGS: CT HEAD FINDINGS There is no evidence of acute infarction, mass lesion, or intra- or extra-axial hemorrhage on CT. Prominence of the ventricles and sulci reflects mild cortical volume loss. Mild cerebellar atrophy is noted. Mild periventricular  and subcortical white matter change likely reflects small vessel ischemic microangiopathy. The brainstem and fourth ventricle are within normal limits. The basal ganglia are unremarkable in appearance. The cerebral hemispheres demonstrate grossly normal gray-white differentiation. No mass effect or midline shift is seen. There is no evidence of fracture; visualized osseous structures are unremarkable in appearance. The visualized portions of the orbits are within normal limits. Mucus retention cyst or polyp is noted at the left maxillary sinus. The remaining paranasal sinuses and mastoid air cells are well-aerated. Soft tissue swelling is noted overlying the left parietal calvarium and at the left vertex. CT CERVICAL SPINE FINDINGS There is no evidence of fracture or subluxation. Vertebral bodies demonstrate normal height and alignment. Multilevel disc space narrowing is noted along the lower cervical spine, with small anterior and posterior disc osteophyte complexes. Prevertebral soft tissues are within normal limits. The visualized neural foramina are grossly unremarkable. The thyroid gland is unremarkable in appearance. No significant soft tissue abnormalities are seen. IMPRESSION: 1. No evidence of traumatic intracranial injury or fracture. 2. No evidence of fracture or subluxation along the cervical spine. 3. Soft tissue swelling overlying the left parietal calvarium and at the left vertex. 4. Mild cortical volume loss and scattered small vessel ischemic microangiopathy. 5. Mild degenerative change along the lower cervical spine. 6. Mucus retention cyst or polyp at the left maxillary sinus. Electronically Signed   By: Roanna Raider M.D.   On: 06/15/2015 02:37   Mr Brain Wo Contrast  06/15/2015  CLINICAL DATA:  Chief complaint. Fall with confusion. Alcoholic cirrhosis. Encephalopathy. EXAM: MRI HEAD WITHOUT CONTRAST TECHNIQUE: Multiplanar, multiecho pulse sequences of the brain and surrounding structures  were obtained without intravenous contrast. COMPARISON:  CT head earlier today. FINDINGS: No evidence for acute infarction, hemorrhage, mass lesion, hydrocephalus, or extra-axial fluid. Generalized atrophy. Mild T2 and FLAIR hyperintensities, nonspecific, likely chronic microvascular ischemic change. 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 Prominent Perivascular spaces. Flow voids are maintained. No chronic hemorrhage. T1 weighted images do not show characteristic features of hepatic encephalopathy. No midline abnormality. Extracranial soft tissues unremarkable, except for a LEFT parietal vertex scalp hematoma. IMPRESSION: No intracranial posttraumatic sequelae are evident. There is advanced atrophy with chronic microvascular ischemic change. No specific features of hepatic encephalopathy are observed. Electronically Signed   By: Elsie Stain M.D.   On: 06/15/2015 15:56   US Abdomen Complete  06/16/2015  CLINICAL DATA:  65 year old male with hyperbilirubinemia. Alcohol abuse. Acute hepatitis. EXAM: ABDOMEN ULTRASOUND COMPLETE COMPARISON:  No priors.  CT of the abdomen and pelvis 06/15/2015. FINDINGS: Gallbladder: No gallstones or wall thickening visualized. No sonographic Murphy sign noted by sonographer. Common bile duct:  Could not be visualized. Liver: Heterogeneous in echotexture, which generally increased echogenicity, compatible with severe hepatic steatosis. No discrete mass confidently identified. No intrahepatic biliary ductal dilatation noted. IVC: No abnormality visualized. Pancreas: Visualized portion unremarkable. Spleen: Size and appearance within normal limits.  9.7 cm in length. Right Kidney: Length: 11.9 cm. Echogenicity within normal limits. No mass or hydronephrosis visualized. Left Kidney: Length: 12.0 cm. Echogenicity within normal limits. No mass or hydronephrosis visualized. Abdominal aorta: No  aneurysm visualized. Other findings: Trace amount of perisplenic ascites. IMPRESSION: 1. Severe hepatic steatosis. 2. Trace amount of perisplenic ascites. Electronically Signed   By: Trudie Reed M.D.   On: 06/16/2015 10:04   Ct Abdomen Pelvis W Contrast  06/15/2015  CLINICAL DATA:  Found down with chest and abdominal bruising. Initial encounter. EXAM: CT CHEST, ABDOMEN, AND PELVIS WITH CONTRAST TECHNIQUE: Multidetector CT imaging of the chest, abdomen and pelvis was performed following the standard protocol during bolus administration of intravenous contrast. CONTRAST:  100 mL ISOVUE-300 IOPAMIDOL (ISOVUE-300) INJECTION 61% COMPARISON:  CT of the chest, abdomen and pelvis performed 04/05/2015 FINDINGS: CT CHEST Minimal bibasilar atelectasis is noted. The lungs are otherwise clear. No pleural effusion or pneumothorax is seen. No masses are identified. There is no evidence of pulmonary parenchymal contusion. Scattered coronary artery calcifications are seen. No pericardial effusion is identified. No mediastinal lymphadenopathy is seen. There is no evidence of venous hemorrhage. The great vessels are grossly unremarkable in appearance. Mild calcification is seen along the aortic arch. The visualized portions of thyroid gland are unremarkable. No axillary lymphadenopathy is seen. There is no evidence of significant soft tissue injury along the chest wall. No acute osseous abnormalities are identified. Chronic right-sided rib deformities are noted. CT ABDOMEN AND PELVIS No free air or free fluid is seen within the abdomen or pelvis. There is no evidence of solid or hollow organ injury. There is a diffusely heterogeneous appearance to the liver, with multiple areas of decreased attenuation. Underlying mass cannot be excluded. The appearance raises question for hepatic cirrhosis and regenerative nodules. The gallbladder is within normal limits. The pancreas and adrenal glands are unremarkable. There is  recanalization of the umbilical vein, extending to the vessels of the anterior abdominal wall. Scattered gastric and esophageal varices are seen. A tiny umbilical hernia is noted, containing only fat. The kidneys are unremarkable in appearance. There is no evidence of hydronephrosis. No renal or ureteral stones are seen. Mild nonspecific perinephric stranding is noted bilaterally. No free fluid is identified. The small bowel is unremarkable in appearance. The stomach is within normal limits. No acute vascular abnormalities are seen. Mild scattered calcification is seen along the abdominal aorta and its branches. The appendix is not definitely characterized; there is no evidence of appendicitis. Scattered diverticulosis is noted along the descending and sigmoid colon, without evidence of diverticulitis. The bladder is decompressed, with a Foley catheter in place. The prostate is borderline prominent. No inguinal lymphadenopathy is seen. No acute osseous abnormalities are identified. 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 IMPRESSION: 1. No evidence of traumatic injury to the chest, abdomen or pelvis. 2. Changes of hepatic cirrhosis, with underlying diffuse heterogeneity of the liver, concerning for degenerative nodules. Heterogeneity is more prominent than on the prior study. Underlying mass cannot be excluded. Dynamic liver protocol MRI or CT would  be helpful for further evaluation. 3. Minimal bibasilar atelectasis noted.  Lungs otherwise clear. 4. Scattered coronary artery calcifications seen. 5. Recanalization of the umbilical vein, extending to the vessels of the anterior abdominal wall. Scattered gastric and esophageal varices noted. 6. Tiny umbilical hernia, containing only fat. 7. Mild scattered calcification along the abdominal aorta and its branches. 8. Scattered diverticulosis along the descending and sigmoid colon, without evidence of diverticulitis. 9. 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  Electronically Signed   By: Roanna Raider M.D.   On: 06/15/2015 02:47    ECG  PAT with HR 130s  ASSESSMENT AND PLAN  1. LV dysfunction with inferolateral akinesis seen on echo  - discussed with MD, given lack of chest pain, will pursue lexiscan myoview.   2. Episodic PAT: given liver issue, may consider change metoprolol to propranolol. Otherwise continue BB  3. AMS: improving, still slow to follow command  4. EtOH hepatitis with cirrhosis: seen by GI  5. UTI: per IM  6. Chronic thrombocytopenia: stable  7. HTN  8. HLD  Signed, Azalee Course, PA-C 06/18/2015, 1:35 PM   I have seen and examined the patient along with Azalee Course, PA-C.  I have reviewed the chart, notes and new data.  I agree with PA/NP's note.  Key new complaints: still looks a little dazed, disoriented. Denies chest pain, dyspnea, palpitations or any recollection of syncope Key examination changes: normal CV exam, jaundice Key new findings / data: I have reviewed the echo images (both studies) and have a difference of opinion. While I agree that LVEF is mildly depressed, I do not see convincing evidence for regional wall motion variations. 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 No acute repol changes are present. Several sustained, but usually brief runs of narrow complex long RP tachycardia (ectopic atrial tachycardia) are seen on monitor and appear to be asymptomatic.  PLAN: No indication for angiography. Would also like to avoid invasive procedures due to liver disease, thrombocytopenia and coagulopathy. Will get a perfusion study to try to clear up issue of reported inferolateral wall motion abnormality. 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 If preferred by GI, can switch beta blocker to a nonselective agent such as nadolol or propanolol, for portal HTN. Biggest problem is alcoholism and  secondary liver disease.  Thurmon Fair, MD, [facility] [phone] 06/18/2015, 2:02 PM

## 2015-06-18 NOTE — Clinical Social Work Placement (Signed)
   CLINICAL SOCIAL WORK PLACEMENT  NOTE  Date:  06/18/2015  Patient Details  Name: Gary [name] MRN: [id number] Date of Birth: Nov 05, 1950  Clinical Social Work is seeking post-discharge placement for this patient at the Skilled  Nursing Facility level of care (*CSW will initial, date and re-position this form in  chart as items are completed):      Patient/family provided with [facility] Clinical Social Work Department's list of facilities offering this level of care within the geographic area requested by the patient (or if unable, by the patient's family).      Patient/family informed of their freedom to choose among providers that offer the needed level of care, that participate in Medicare, Medicaid or managed care program needed by the patient, have an available bed and are willing to accept the patient.      Patient/family informed of [facility]'s ownership interest in [facility] and [facility], as well as of the fact that they are under no obligation to receive care at these facilities.  PASRR submitted to EDS on       PASRR number received on       Existing PASRR number confirmed on 06/18/15     FL2 transmitted to all facilities in geographic area requested by pt/family on 06/18/15     FL2 transmitted to all facilities within larger geographic area on       Patient informed that his/her managed care company has contracts with or will negotiate with certain facilities, including the following:            Patient/family informed of bed offers received.  Patient chooses bed at       Physician recommends and patient chooses bed at      Patient to be transferred to   on  .  Patient to be transferred to facility by       Patient family notified on   of transfer.  Name of family member notified:        PHYSICIAN       Additional Comment:    _______________________________________________ Mearl Latin, LCSWA 06/18/2015, 2:31 PM

## 2015-06-18 NOTE — [place] FL2 (Signed)
[place] MEDICAID FL2 LEVEL OF CARE SCREENING TOOL     IDENTIFICATION  Patient Name: Gary [name] Birthdate: April 15, 1950 Sex: male Admission Date (Current Location): 06/14/2015  [facility] and [place] Number:  Producer, television/film/video and Address:  [facility], [address], [place], [place] [address]      Provider Number: [id number]  Attending Physician Name and Address:  Cathren Harsh, MD  Relative Name and Phone Number:  Tish Men [phone]    Current Level of Care: SNF Recommended Level of Care: Skilled Nursing Facility Prior Approval Number:    Date Approved/Denied:   PASRR Number: [id number]  Discharge Plan: SNF    Current Diagnoses: Patient Active Problem List   Diagnosis Date Noted  . Alcoholic hepatitis with ascites   . Altered mental status 06/15/2015  . Hepatic cirrhosis (HCC) 06/15/2015  . Elevated troponin 06/15/2015  . Elevated CK 06/15/2015  . Fall 06/15/2015  . Maxillary sinus fracture (HCC) 04/05/2015  . Elevated LFTs 11/21/2014  . Hepatitis 11/21/2014  . Alcohol abuse 11/21/2014  . Hypokalemia   . Hypomagnesemia   . Increased liver enzymes   . Polyarthritis of multiple sites (HCC)   . Esophageal reflux   . Alcoholic cirrhosis of liver without ascites (HCC)   . Shortness of breath 04/25/2014  . History of alcohol abuse 04/25/2014  . Lower extremity edema 04/25/2014  . Ascites 04/25/2014  . Rash 04/25/2014  . Left ankle pain 04/25/2014  . Hyponatremia   . Hyperbilirubinemia 03/01/2014  . Anemia 03/01/2014  . Thrombocytopenia (HCC) 03/01/2014  . Alcohol dependence with withdrawal with complication (HCC) 02/28/2014  . Alcoholic hepatitis without ascites 02/25/2014  . Acute hepatitis 02/25/2014    Orientation RESPIRATION BLADDER Height & Weight     Self, Place, Situation  Normal Continent Weight: 216 lb 4.3 oz (98.1 kg) Height:   (177.8 cm)  BEHAVIORAL SYMPTOMS/MOOD NEUROLOGICAL BOWEL NUTRITION  STATUS      Incontinent Diet (Please see DC summary)  AMBULATORY STATUS COMMUNICATION OF NEEDS Skin   Extensive Assist Verbally Normal                       Personal Care Assistance Level of Assistance  Bathing, Feeding, Dressing Bathing Assistance: Maximum assistance Feeding assistance: Limited assistance Dressing Assistance: Limited assistance     Functional Limitations Info             SPECIAL CARE FACTORS FREQUENCY  PT (By licensed PT)     PT Frequency: min 3x/week              Contractures      Additional Factors Info  Code Status, Allergies Code Status Info: Full Allergies Info: NKA           Current Medications (06/18/2015):  This is the current hospital active medication list Current Facility-Administered Medications  Medication Dose Route Frequency Provider Last Rate Last Dose  . cefTRIAXone (ROCEPHIN) 1 g in dextrose 5 % 50 mL IVPB  1 g Intravenous Q24H Alberteen Sam, MD   1 g at 06/18/15 0519  . folic acid (FOLVITE) tablet 1 mg  1 mg Oral Daily Alberteen Sam, MD   1 mg at 06/18/15 5284  . hydroxypropyl methylcellulose / hypromellose (ISOPTO TEARS / GONIOVISC) 2.5 % ophthalmic solution 1 drop  1 drop Both Eyes QID PRN Ripudeep Jenna Luo, MD   1 drop at 06/18/15 0921  . lactulose (CHRONULAC) 10 GM/15ML solution 30  g  30 g Oral TID Alberteen Sam, MD   30 g at 06/18/15 0918  . LORazepam (ATIVAN) tablet 1 mg  1 mg Oral Q6H PRN Jinger Neighbors, NP       Or  . LORazepam (ATIVAN) injection 1 mg  1 mg Intravenous Q6H PRN Jinger Neighbors, NP      . metoprolol tartrate (LOPRESSOR) tablet 12.5 mg  12.5 mg Oral BID Ripudeep Jenna Luo, MD   12.5 mg at 06/18/15 7867  . multivitamin with minerals tablet 1 tablet  1 tablet Oral Daily Jinger Neighbors, NP   1 tablet at 06/18/15 [phone]  . oxyCODONE (Oxy IR/ROXICODONE) immediate release tablet 5 mg  5 mg Oral Q4H PRN Alberteen Sam, MD      . pantoprazole (PROTONIX) EC tablet 40 mg  40 mg Oral Daily  Alberteen Sam, MD   40 mg at 06/18/15 0918  . prednisoLONE tablet 40 mg  40 mg Oral Daily Beverley Fiedler, MD   40 mg at 06/18/15 0918  . senna-docusate (Senokot-S) tablet 1 tablet  1 tablet Oral QHS PRN Alberteen Sam, MD      . sodium chloride flush (NS) 0.9 % injection 3 mL  3 mL Intravenous Q12H Alberteen Sam, MD   3 mL at 06/18/15 0920  . thiamine (VITAMIN B-1) tablet 100 mg  100 mg Oral Daily Alberteen Sam, MD   100 mg at 06/18/15 9470     Discharge Medications: Please see discharge summary for a list of discharge medications.  Relevant Imaging Results:  Relevant Lab Results:   Additional Information SSN: [id number] [place], [place]

## 2015-06-18 NOTE — Progress Notes (Signed)
[facility]                                                                              Patient Demographics  Gary [name], is a 65 y.o. male, DOB - Jan 27, 1951, WUJ:[id number]  Admit date - 06/14/2015   Admitting Physician Alberteen Sam, MD  Outpatient Primary MD for the patient is [name], NP  LOS - 3  days    Chief Complaint  Patient presents with  . Altered Mental Status       Brief HPI  Gary [name] is a 65 y.o. male with a past medical history significant for alcoholic cirrhosis, HTN who presented after unwitnessed fall, found down. Evidently, his wife is in [place] (he is able to sort of relate this), the patient was last seen about 3-4 days ago by neighbors. His sister had called her neighbor to check on him and patient was found face down on the floor of his home, disoriented, weak. EMS were called who found him awake, interactive, oriented only to person and place, slowly following commands, weak, dry mucous membranes, very bruised, jaundiced, incontinent of urine. In the ED, he was tachycardic, afebrile, saturating well on ambient air. Remained confused and jaundiced. Na 130, K 2.7, Cr. 1.1, TBili 13, AST/ALT 280/100, WBC 8K, Hgb 13, platetelets 71K (at baseline). CK 2000, mild troponin leak. Cards thought troponin leak was expected given found down and degree of CK elevation. CT imaging of the head, C-spine, chest abdomen and pelvis with contrast were unremarkable except for increased heterogeneity of the liver. Flat radiographs of both elbows were negative for fracture. Chest x-ray clear. Got 2L NS and ceftriaxone for UTI, and TRH were asked to evaluate for admission.     Assessment & Plan   Altered mental status: Significantly better, appears to be at his baseline now - Multifactorial, possibility of hepatic encephalopathy with cirrhosis, UTI, multiple metabolic abnormalities including hyponatremia, hypokalemia, rhabdomyolysis, ? EtOH  withdrawal - No focal neurological deficits noted, CT head negative, MRI brain negative, EEG consistent with a mild generalized nonspecific cerebral dysfunction/encephalopathy. No seizure activity noted. -  Ammonia level was elevated at 92, continue oral lactulose. - TSH normal - UA positive for UTI, follow urine cultures, continue IV Rocephin - B12 folate and normal limits -Diet advanced to solid    Hyperbilirubinemia, jaundice: Likely due to liver cirrhosis, has macrocytic anemia, thrombocytopenia, hepatic encephalopathy/hyperammonemia - CT abdomen showed hepatic cirrhosis with degenerative nodules - Ultrasound showed severe hepatic steatosis, trace amount of perisplenic ascites - on steroids for acute alcoholic hepatitis, follow AFP currently in process - will follow GI recommendations regarding EGD and MRCP- inpatient vs outpatient  Essential hypertension, tachycardia, +troponin  - Normotensive at admission.Restarted beta blocker due to tachycardia, possibly beta blocker withdrawals, EtOH withdrawal - 2-D echo showed EF of 55-60%, no wall motion abnormalities with grade 1 diastolic dysfunction, ? Inferior wall motion abnormality, recommending Definity echo.  - Definity echo with EF 40-45%, akinesis of the entireinferolateral myocardium. Probable hypokinesis of the  entire inferior myocardium. - Continue beta blocker, lisinopril, cardiology consult  History of alcohol abuse -  Continue CIWA  protocol with ativan  Rhabdomyolysis: Likely due to immobility, found down CK is trending down, DC fluids   Thrombocytopenia: Due to liver cirrhosis - follow counts   Hypokalemia, hypomagnesemia:  Resolved   Hyponatremia: resolved, likely due to hypovolemia and dehydration   Elevated troponin - Possible troponin leak due to acute rhabdomyolysis - 2-D echo showed EF 55-60%, normal wall motion, no regional wall motion abnormalities, grade 1 diastolic dysfunction. Possible inferior wall  motion abnormality, recommend limited study with Definity contrast which showed EF of 40-45% with diffuse hypokinesis. Cardiology consulted   Code Status: Full code   Family Communication: No family member at the bedside  Disposition Plan: PT evaluation recommended skilled nursing facility   Time Spent in minutes   25 minutes  Procedures  CT abd, head MRI brain  Consults  G I  Cardiology   DVT Prophylaxis  SCD's  Medications  Scheduled Meds: . cefTRIAXone (ROCEPHIN)  IV  1 g Intravenous Q24H  . folic acid  1 mg Oral Daily  . lactulose  30 g Oral TID  . metoprolol tartrate  12.5 mg Oral BID  . multivitamin with minerals  1 tablet Oral Daily  . pantoprazole  40 mg Oral Daily  . prednisoLONE  40 mg Oral Daily  . sodium chloride flush  3 mL Intravenous Q12H  . thiamine  100 mg Oral Daily   Continuous Infusions:   PRN Meds:.hydroxypropyl methylcellulose / hypromellose, LORazepam **OR** LORazepam, oxyCODONE, senna-docusate   Antibiotics   Anti-infectives    Start     Dose/Rate Route Frequency Ordered Stop   06/15/15 0530  cefTRIAXone (ROCEPHIN) 1 g in dextrose 5 % 50 mL IVPB     1 g 100 mL/hr over 30 Minutes Intravenous Every 24 hours 06/15/15 0517 06/22/15 0529        Subjective:   Gary [name] was seen and examined today. Alert and oriented, No chest pain, abdominal pain, shortness of breath, fevers or chills.    Objective:   Filed Vitals:   06/17/15 1453 06/17/15 2203 06/18/15 0518 06/18/15 0854  BP: 112/72 114/76 133/90   Pulse: 78 81 71 85  Temp: 98 F (36.7 C) 98 F (36.7 C) 97.9 F (36.6 C)   TempSrc:  Oral Oral   Resp: 18 18 16    Height:      Weight:      SpO2: 97% 100% 100% 99%    Intake/Output Summary (Last 24 hours) at 06/18/15 1140 Last data filed at 06/18/15 9604  Gross per 24 hour  Intake    530 ml  Output   1050 ml  Net   -520 ml     Wt Readings from Last 3 Encounters:  06/16/15 98.1 kg (216 lb 4.3 oz)  04/05/15 83.915 kg  (185 lb)  11/21/14 82.158 kg (181 lb 2 oz)     Exam  General:Alert and oriented 3, NAD, jaundiced  HEENT: icteric Sclera,   Neck: Supple, no JVD  CVS: S1 S2 clear, RRR  Respiratory: CTAB, no rhonchi  Abdomen: Soft, nontender, nondistended, + bowel sounds  Ext: no cyanosis clubbing or edema, tremulousBut significantly improved    Neuro: strength 5/5 in upper and lower extremities bilaterally   Skin:  No rashes  Psych: mental status better today  Data Reviewed:  I have personally reviewed following labs and imaging studies  Micro Results Recent Results (from the past 240 hour(s))  Urine culture     Status: None   Collection Time: 06/14/15 11:40 PM  Result Value Ref Range Status   Specimen Description URINE, RANDOM  Final   Special Requests NONE  Final   Culture NO GROWTH 1 DAY  Final   Report Status 06/16/2015 FINAL  Final    Radiology Reports Dg Chest 1 View  06/15/2015  CLINICAL DATA:  Found down at home. EXAM: CHEST 1 VIEW COMPARISON:  Chest radiograph April 05, 2015 FINDINGS: Cardiac silhouette is mildly enlarged, mediastinal silhouette is unremarkable for this low inspiratory portable examination with crowded vasculature markings. The lungs are clear without pleural effusions or focal consolidations. Bandlike density LEFT lung base. Stable LEFT apical pleural thickening. Trachea projects midline and there is no pneumothorax. Included soft tissue planes and osseous structures are non-suspicious. Old RIGHT posterior rib fractures. Old RIGHT mid clavicle fracture. Osteopenia. IMPRESSION: Mild cardiomegaly and LEFT lung base atelectasis. Electronically Signed   By: Awilda Metro M.D.   On: 06/15/2015 01:40   Dg Elbow Complete Left  06/15/2015  CLINICAL DATA:  Larey Seat at home. EXAM: LEFT ELBOW - COMPLETE 3+ VIEW COMPARISON:  None. FINDINGS: There is no evidence of fracture, dislocation, or joint effusion. There is no evidence of arthropathy or other focal bone  abnormality. Soft tissues are unremarkable. IMPRESSION: Negative. Electronically Signed   By: Ellery Plunk M.D.   On: 06/15/2015 01:42   Dg Elbow Complete Right  06/15/2015  CLINICAL DATA:  Larey Seat at home EXAM: RIGHT ELBOW - COMPLETE 3+ VIEW COMPARISON:  None. FINDINGS: There is no evidence of fracture, dislocation, or joint effusion. There is no evidence of arthropathy or other focal bone abnormality. Soft tissues are unremarkable. IMPRESSION: Negative. Electronically Signed   By: Ellery Plunk M.D.   On: 06/15/2015 01:42   Ct Head Wo Contrast  06/15/2015  CLINICAL DATA:  Found down, with bruising about the head. Concern for head or cervical spine injury. Initial encounter. EXAM: CT HEAD WITHOUT CONTRAST CT CERVICAL SPINE WITHOUT CONTRAST TECHNIQUE: Multidetector CT imaging of the head and cervical spine was performed following the standard protocol without intravenous contrast. Multiplanar CT image reconstructions of the cervical spine were also generated. COMPARISON:  None. FINDINGS: CT HEAD FINDINGS There is no evidence of acute infarction, mass lesion, or intra- or extra-axial hemorrhage on CT. Prominence of the ventricles and sulci reflects mild cortical volume loss. Mild cerebellar atrophy is noted. Mild periventricular and subcortical white matter change likely reflects small vessel ischemic microangiopathy. The brainstem and fourth ventricle are within normal limits. The basal ganglia are unremarkable in appearance. The cerebral hemispheres demonstrate grossly normal gray-white differentiation. No mass effect or midline shift is seen. There is no evidence of fracture; visualized osseous structures are unremarkable in appearance. The visualized portions of the orbits are within normal limits. Mucus retention cyst or polyp is noted at the left maxillary sinus. The remaining paranasal sinuses and mastoid air cells are well-aerated. Soft tissue swelling is noted overlying the left parietal  calvarium and at the left vertex. CT CERVICAL SPINE FINDINGS There is no evidence of fracture or subluxation. Vertebral bodies demonstrate normal height and alignment. Multilevel disc space narrowing is noted along the lower cervical spine, with small anterior and posterior disc osteophyte complexes. Prevertebral soft tissues are within normal limits. The visualized neural foramina are grossly unremarkable. The thyroid gland is unremarkable in appearance. No significant soft tissue abnormalities are seen. IMPRESSION: 1. No evidence of traumatic intracranial injury or fracture. 2. No evidence of fracture or subluxation along the cervical spine. 3. Soft tissue swelling overlying the left parietal  calvarium and at the left vertex. 4. Mild cortical volume loss and scattered small vessel ischemic microangiopathy. 5. Mild degenerative change along the lower cervical spine. 6. Mucus retention cyst or polyp at the left maxillary sinus. Electronically Signed   By: Roanna Raider M.D.   On: 06/15/2015 02:37   Ct Chest W Contrast  06/15/2015  CLINICAL DATA:  Found down with chest and abdominal bruising. Initial encounter. EXAM: CT CHEST, ABDOMEN, AND PELVIS WITH CONTRAST TECHNIQUE: Multidetector CT imaging of the chest, abdomen and pelvis was performed following the standard protocol during bolus administration of intravenous contrast. CONTRAST:  100 mL ISOVUE-300 IOPAMIDOL (ISOVUE-300) INJECTION 61% COMPARISON:  CT of the chest, abdomen and pelvis performed 04/05/2015 FINDINGS: CT CHEST Minimal bibasilar atelectasis is noted. The lungs are otherwise clear. No pleural effusion or pneumothorax is seen. No masses are identified. There is no evidence of pulmonary parenchymal contusion. Scattered coronary artery calcifications are seen. No pericardial effusion is identified. No mediastinal lymphadenopathy is seen. There is no evidence of venous hemorrhage. The great vessels are grossly unremarkable in appearance. Mild  calcification is seen along the aortic arch. The visualized portions of thyroid gland are unremarkable. No axillary lymphadenopathy is seen. There is no evidence of significant soft tissue injury along the chest wall. No acute osseous abnormalities are identified. Chronic right-sided rib deformities are noted. CT ABDOMEN AND PELVIS No free air or free fluid is seen within the abdomen or pelvis. There is no evidence of solid or hollow organ injury. There is a diffusely heterogeneous appearance to the liver, with multiple areas of decreased attenuation. Underlying mass cannot be excluded. The appearance raises question for hepatic cirrhosis and regenerative nodules. The gallbladder is within normal limits. The pancreas and adrenal glands are unremarkable. There is recanalization of the umbilical vein, extending to the vessels of the anterior abdominal wall. Scattered gastric and esophageal varices are seen. A tiny umbilical hernia is noted, containing only fat. The kidneys are unremarkable in appearance. There is no evidence of hydronephrosis. No renal or ureteral stones are seen. Mild nonspecific perinephric stranding is noted bilaterally. No free fluid is identified. The small bowel is unremarkable in appearance. The stomach is within normal limits. No acute vascular abnormalities are seen. Mild scattered calcification is seen along the abdominal aorta and its branches. The appendix is not definitely characterized; there is no evidence of appendicitis. Scattered diverticulosis is noted along the descending and sigmoid colon, without evidence of diverticulitis. The bladder is decompressed, with a Foley catheter in place. The prostate is borderline prominent. No inguinal lymphadenopathy is seen. No acute osseous abnormalities are identified. 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 IMPRESSION: 1. No evidence of traumatic injury to the chest, abdomen or pelvis. 2. Changes of hepatic cirrhosis, with underlying  diffuse heterogeneity of the liver, concerning for degenerative nodules. Heterogeneity is more prominent than on the prior study. Underlying mass cannot be excluded. Dynamic liver protocol MRI or CT would be helpful for further evaluation. 3. Minimal bibasilar atelectasis noted.  Lungs otherwise clear. 4. Scattered coronary artery calcifications seen. 5. Recanalization of the umbilical vein, extending to the vessels of the anterior abdominal wall. Scattered gastric and esophageal varices noted. 6. Tiny umbilical hernia, containing only fat. 7. Mild scattered calcification along the abdominal aorta and its branches. 8. Scattered diverticulosis along the descending and sigmoid colon, without evidence of diverticulitis. 9. 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 Electronically Signed   By: Roanna Raider M.D.   On: 06/15/2015  02:47   Ct Cervical Spine Wo Contrast  06/15/2015  CLINICAL DATA:  Found down, with bruising about the head. Concern for head or cervical spine injury. Initial encounter. EXAM: CT HEAD WITHOUT CONTRAST CT CERVICAL SPINE WITHOUT CONTRAST TECHNIQUE: Multidetector CT imaging of the head and cervical spine was performed following the standard protocol without intravenous contrast. Multiplanar CT image reconstructions of the cervical spine were also generated. COMPARISON:  None. FINDINGS: CT HEAD FINDINGS There is no evidence of acute infarction, mass lesion, or intra- or extra-axial hemorrhage on CT. Prominence of the ventricles and sulci reflects mild cortical volume loss. Mild cerebellar atrophy is noted. Mild periventricular and subcortical white matter change likely reflects small vessel ischemic microangiopathy. The brainstem and fourth ventricle are within normal limits. The basal ganglia are unremarkable in appearance. The cerebral hemispheres demonstrate grossly normal gray-white differentiation. No mass effect or midline shift is seen. There is no evidence of fracture; visualized osseous  structures are unremarkable in appearance. The visualized portions of the orbits are within normal limits. Mucus retention cyst or polyp is noted at the left maxillary sinus. The remaining paranasal sinuses and mastoid air cells are well-aerated. Soft tissue swelling is noted overlying the left parietal calvarium and at the left vertex. CT CERVICAL SPINE FINDINGS There is no evidence of fracture or subluxation. Vertebral bodies demonstrate normal height and alignment. Multilevel disc space narrowing is noted along the lower cervical spine, with small anterior and posterior disc osteophyte complexes. Prevertebral soft tissues are within normal limits. The visualized neural foramina are grossly unremarkable. The thyroid gland is unremarkable in appearance. No significant soft tissue abnormalities are seen. IMPRESSION: 1. No evidence of traumatic intracranial injury or fracture. 2. No evidence of fracture or subluxation along the cervical spine. 3. Soft tissue swelling overlying the left parietal calvarium and at the left vertex. 4. Mild cortical volume loss and scattered small vessel ischemic microangiopathy. 5. Mild degenerative change along the lower cervical spine. 6. Mucus retention cyst or polyp at the left maxillary sinus. Electronically Signed   By: Roanna Raider M.D.   On: 06/15/2015 02:37   Mr Brain Wo Contrast  06/15/2015  CLINICAL DATA:  Chief complaint. Fall with confusion. Alcoholic cirrhosis. Encephalopathy. EXAM: MRI HEAD WITHOUT CONTRAST TECHNIQUE: Multiplanar, multiecho pulse sequences of the brain and surrounding structures were obtained without intravenous contrast. COMPARISON:  CT head earlier today. FINDINGS: No evidence for acute infarction, hemorrhage, mass lesion, hydrocephalus, or extra-axial fluid. Generalized atrophy. Mild T2 and FLAIR hyperintensities, nonspecific, likely chronic microvascular ischemic change. Unusual appearing LEFT frontal operculum and insular area of cortex and white  matter signal abnormality, likely chronic infarct. Prominent Perivascular spaces. Flow voids are maintained. No chronic hemorrhage. T1 weighted images do not show characteristic features of hepatic encephalopathy. No midline abnormality. Extracranial soft tissues unremarkable, except for a LEFT parietal vertex scalp hematoma. IMPRESSION: No intracranial posttraumatic sequelae are evident. There is advanced atrophy with chronic microvascular ischemic change. No specific features of hepatic encephalopathy are observed. Electronically Signed   By: Elsie Stain M.D.   On: 06/15/2015 15:56   US Abdomen Complete  06/16/2015  CLINICAL DATA:  65 year old male with hyperbilirubinemia. Alcohol abuse. Acute hepatitis. EXAM: ABDOMEN ULTRASOUND COMPLETE COMPARISON:  No priors.  CT of the abdomen and pelvis 06/15/2015. FINDINGS: Gallbladder: No gallstones or wall thickening visualized. No sonographic Murphy sign noted by sonographer. Common bile duct:  Could not be visualized. Liver: Heterogeneous in echotexture, which generally increased echogenicity, compatible with severe hepatic steatosis. No discrete  mass confidently identified. No intrahepatic biliary ductal dilatation noted. IVC: No abnormality visualized. Pancreas: Visualized portion unremarkable. Spleen: Size and appearance within normal limits.  9.7 cm in length. Right Kidney: Length: 11.9 cm. Echogenicity within normal limits. No mass or hydronephrosis visualized. Left Kidney: Length: 12.0 cm. Echogenicity within normal limits. No mass or hydronephrosis visualized. Abdominal aorta: No aneurysm visualized. Other findings: Trace amount of perisplenic ascites. IMPRESSION: 1. Severe hepatic steatosis. 2. Trace amount of perisplenic ascites. Electronically Signed   By: Trudie Reed M.D.   On: 06/16/2015 10:04   Ct Abdomen Pelvis W Contrast  06/15/2015  CLINICAL DATA:  Found down with chest and abdominal bruising. Initial encounter. EXAM: CT CHEST, ABDOMEN, AND  PELVIS WITH CONTRAST TECHNIQUE: Multidetector CT imaging of the chest, abdomen and pelvis was performed following the standard protocol during bolus administration of intravenous contrast. CONTRAST:  100 mL ISOVUE-300 IOPAMIDOL (ISOVUE-300) INJECTION 61% COMPARISON:  CT of the chest, abdomen and pelvis performed 04/05/2015 FINDINGS: CT CHEST Minimal bibasilar atelectasis is noted. The lungs are otherwise clear. No pleural effusion or pneumothorax is seen. No masses are identified. There is no evidence of pulmonary parenchymal contusion. Scattered coronary artery calcifications are seen. No pericardial effusion is identified. No mediastinal lymphadenopathy is seen. There is no evidence of venous hemorrhage. The great vessels are grossly unremarkable in appearance. Mild calcification is seen along the aortic arch. The visualized portions of thyroid gland are unremarkable. No axillary lymphadenopathy is seen. There is no evidence of significant soft tissue injury along the chest wall. No acute osseous abnormalities are identified. Chronic right-sided rib deformities are noted. CT ABDOMEN AND PELVIS No free air or free fluid is seen within the abdomen or pelvis. There is no evidence of solid or hollow organ injury. There is a diffusely heterogeneous appearance to the liver, with multiple areas of decreased attenuation. Underlying mass cannot be excluded. The appearance raises question for hepatic cirrhosis and regenerative nodules. The gallbladder is within normal limits. The pancreas and adrenal glands are unremarkable. There is recanalization of the umbilical vein, extending to the vessels of the anterior abdominal wall. Scattered gastric and esophageal varices are seen. A tiny umbilical hernia is noted, containing only fat. The kidneys are unremarkable in appearance. There is no evidence of hydronephrosis. No renal or ureteral stones are seen. Mild nonspecific perinephric stranding is noted bilaterally. No free fluid  is identified. The small bowel is unremarkable in appearance. The stomach is within normal limits. No acute vascular abnormalities are seen. Mild scattered calcification is seen along the abdominal aorta and its branches. The appendix is not definitely characterized; there is no evidence of appendicitis. Scattered diverticulosis is noted along the descending and sigmoid colon, without evidence of diverticulitis. The bladder is decompressed, with a Foley catheter in place. The prostate is borderline prominent. No inguinal lymphadenopathy is seen. No acute osseous abnormalities are identified. 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 IMPRESSION: 1. No evidence of traumatic injury to the chest, abdomen or pelvis. 2. Changes of hepatic cirrhosis, with underlying diffuse heterogeneity of the liver, concerning for degenerative nodules. Heterogeneity is more prominent than on the prior study. Underlying mass cannot be excluded. Dynamic liver protocol MRI or CT would be helpful for further evaluation. 3. Minimal bibasilar atelectasis noted.  Lungs otherwise clear. 4. Scattered coronary artery calcifications seen. 5. Recanalization of the umbilical vein, extending to the vessels of the anterior abdominal wall. Scattered gastric and esophageal varices noted. 6. Tiny umbilical hernia, containing only fat.  7. Mild scattered calcification along the abdominal aorta and its branches. 8. Scattered diverticulosis along the descending and sigmoid colon, without evidence of diverticulitis. 9. 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 Electronically Signed   By: Roanna Raider M.D.   On: 06/15/2015 02:47    CBC  Recent Labs Lab 06/14/15 2237 06/15/15 0621 06/16/15 0538 06/17/15 0557 06/18/15 0530  WBC 8.0 7.0 6.0 6.0 8.6  HGB 13.2 11.7* 11.6* 11.9* 13.5  HCT 36.5* 32.6* 33.6* 33.8* 37.9*  PLT 71* 60* 64* 71* 80*  MCV 99.7 100.6* 99.4 99.7 103.0*  MCH 36.1* 36.1* 34.3* 35.1* 36.7*  MCHC 36.2* 35.9 34.5 35.2 35.6    RDW 15.0 15.4 16.2* 16.9* 18.3*  LYMPHSABS 0.5*  --   --   --   --   MONOABS 1.2*  --   --   --   --   EOSABS 0.0  --   --   --   --   BASOSABS 0.0  --   --   --   --     Chemistries   Recent Labs Lab 06/14/15 2237 06/14/15 2330 06/15/15 0621 06/16/15 0538 06/16/15 0957 06/16/15 1936 06/17/15 0557 06/18/15 0530  NA 130*  --  135 135  --   --  134* 135  K 2.7*  --  3.0* 2.7*  --  3.1* 3.9 3.8  CL 92*  --  98* 102  --   --  104 103  CO2 21*  --  21* 23  --   --  21* 22  GLUCOSE 119*  --  101* 103*  --   --  134* 99  BUN 18  --  14 11  --   --  8 8  CREATININE 1.10  --  1.03 0.74  --   --  0.72 0.73  CALCIUM 8.7*  --  8.0* 8.0*  --   --  8.3* 8.3*  MG  --  1.9  --   --  1.5*  --  1.9  --   AST 284*  --  274* 247*  --   --  216* 190*  ALT 102*  --  92* 85*  --   --  86* 93*  ALKPHOS 148*  --  122 126  --   --  125 154*  BILITOT 13.6*  --  13.1* 13.1*  --   --  13.9* 11.9*   ------------------------------------------------------------------------------------------------------------------ estimated creatinine clearance is 108.1 mL/min (by C-G formula based on Cr of 0.73). ------------------------------------------------------------------------------------------------------------------ No results for input(s): HGBA1C in the last 72 hours. ------------------------------------------------------------------------------------------------------------------ No results for input(s): CHOL, HDL, LDLCALC, TRIG, CHOLHDL, LDLDIRECT in the last 72 hours. ------------------------------------------------------------------------------------------------------------------ No results for input(s): TSH, T4TOTAL, T3FREE, THYROIDAB in the last 72 hours.  Invalid input(s): FREET3 ------------------------------------------------------------------------------------------------------------------  Recent Labs  06/15/15 1704  VITAMINB12 1494*  FERRITIN 1542*  TIBC 106*  IRON 100  RETICCTPCT 3.6*     Coagulation profile  Recent Labs Lab 06/16/15 0538 06/18/15 0530  INR 2.19* 1.98*    No results for input(s): DDIMER in the last 72 hours.  Cardiac Enzymes  Recent Labs Lab 06/15/15 0621 06/15/15 1126 06/15/15 1704  TROPONINI 0.10* 0.11* 0.08*   ------------------------------------------------------------------------------------------------------------------ Invalid input(s): POCBNP  No results for input(s): GLUCAP in the last 72 hours.   RAI,RIPUDEEP M.D. [facility] 06/18/2015, 11:40 AM  Pager: [phone] Between 7am to 7pm - call Pager - [phone]  After 7pm go to [email or web address] - password [email or web address]  Call night coverage  person covering after 7pm

## 2015-06-18 NOTE — Progress Notes (Signed)
Patient has [facility] Medicare.  Osborne Casco Maverick Patman LCSWA [phone]

## 2015-06-18 NOTE — Evaluation (Signed)
Physical Therapy Evaluation Patient Details Name: Gary [name] MRN: [id number] DOB: May 08, 1950 Today's Date: 06/18/2015   History of Present Illness  65 yo male with acute hepatic encephalopathy corroborated with EEG.  AMS, tachycardia, dehydration, UTI, elevated CK, ascites, hepatic steatosis.  PMHx:  maxillary sinus fracture, falls, tremors, cirrhosis, hepatitis, CHF  Clinical Impression  Pt is up to walk only a shifting couple steps bedside with unsteady balance both sitting and standing.   He is motivated to try but due to his confusion it is decreasing his independence.  Will recommend SNF as he is both weak and unsteady, and should be able to get back his mobility independence to get home safely.  No clear information on when his wife is returning from her trip.    Follow Up Recommendations SNF    Equipment Recommendations  None recommended by PT    Recommendations for Other Services Rehab consult     Precautions / Restrictions Precautions Precautions: Fall (telemetry) Restrictions Weight Bearing Restrictions: No      Mobility  Bed Mobility Overal bed mobility: Needs Assistance Bed Mobility: Supine to Sit;Sit to Supine     Supine to sit: Mod assist Sit to supine: Mod assist;Min assist   General bed mobility comments: needed assistance under trunk and legs, sequencing by PT  Transfers Overall transfer level: Needs assistance Equipment used: Rolling walker (2 wheeled);1 person hand held assist Transfers: Sit to/from Stand Sit to Stand: Mod assist;From elevated surface (with dense cues for  hand placement every trial)            Ambulation/Gait Ambulation/Gait assistance: Mod assist Ambulation Distance (Feet): 2 Feet Assistive device: Rolling walker (2 wheeled);1 person hand held assist Gait Pattern/deviations: Step-to pattern;Shuffle;Wide base of support;Decreased stride length;Decreased dorsiflexion - right;Decreased dorsiflexion - left (side of bed only,  unsteady) Gait velocity: reduced Gait velocity interpretation: Below normal speed for age/gender General Gait Details: Pt was unable to balance well enough to step away from bed, sidesteps on bedside only  Stairs            Wheelchair Mobility    Modified Rankin (Stroke Patients Only)       Balance Overall balance assessment: Needs assistance;History of Falls Sitting-balance support: Feet supported Sitting balance-Leahy Scale: Poor   Postural control: Posterior lean Standing balance support: Bilateral upper extremity supported Standing balance-Leahy Scale: Poor Standing balance comment: cued for safety and sequence but better sitting after standing practice                             Pertinent Vitals/Pain Pain Assessment: Faces Faces Pain Scale: Hurts little more Pain Location: perineum Pain Descriptors / Indicators: Sore;Tender Pain Intervention(s): Other (comment) (cleaned up loose bowel movement from skin, pt unaware of it)    Home Living Family/patient expects to be discharged to:: Private residence Living Arrangements: Spouse/significant other Available Help at Discharge: Neighbor;Available PRN/intermittently (Wife is gone to [place]) Type of Home: House Home Access: Stairs to enter Entrance Stairs-Rails: None Entrance Stairs-Number of Steps: 2 Home Layout: One level Home Equipment: Cane - single point;Walker - 2 wheels      Prior Function Level of Independence: Independent with assistive device(s)         Comments: cane or RW to ambulate     Hand Dominance   Dominant Hand: Right    Extremity/Trunk Assessment   Upper Extremity Assessment: Generalized weakness  Lower Extremity Assessment: Generalized weakness      Cervical / Trunk Assessment: Normal  Communication   Communication: No difficulties;Other (comment) (has confusion with unrelated thoughts given to PT)  Cognition Arousal/Alertness: Lethargic Behavior  During Therapy: Flat affect Overall Cognitive Status: No family/caregiver present to determine baseline cognitive functioning       Memory: Decreased recall of precautions;Decreased short-term memory              General Comments General comments (skin integrity, edema, etc.): Pt is expecting wife to return but has no idea when.  Is a candidate for SNF regardless as he is not able to sit without a fall risk, not a candidate for home yet.    Exercises        Assessment/Plan    PT Assessment Patient needs continued PT services  PT Diagnosis Difficulty walking;Generalized weakness   PT Problem List Decreased strength;Decreased range of motion;Decreased activity tolerance;Decreased balance;Decreased mobility;Decreased coordination;Decreased cognition;Decreased knowledge of use of DME;Decreased safety awareness;Decreased knowledge of precautions;Cardiopulmonary status limiting activity;Decreased skin integrity;Pain  PT Treatment Interventions DME instruction;Gait training;Functional mobility training;Therapeutic activities;Therapeutic exercise;Balance training;Neuromuscular re-education;Patient/family education   PT Goals (Current goals can be found in the Care Plan section) Acute Rehab PT Goals Patient Stated Goal: to get home with wife PT Goal Formulation: With patient Time For Goal Achievement: 07/02/15 Potential to Achieve Goals: Fair    Frequency Min 3X/week   Barriers to discharge Inaccessible home environment;Decreased caregiver support wife will not be able to assist him alone    Co-evaluation               End of Session Equipment Utilized During Treatment: Gait belt Activity Tolerance: Patient limited by lethargy;Patient limited by fatigue Patient left: in bed;with call bell/phone within reach;with bed alarm set Nurse Communication: Mobility status;Other (comment) (needs to be bathed due to lactulose and loose bowels)         Time: 2446-2863 PT Time  Calculation (min) (ACUTE ONLY): 31 min   Charges:   PT Evaluation $PT Eval Moderate Complexity: 1 Procedure PT Treatments $Therapeutic Activity: 8-22 mins   PT G Codes:        Ivar Drape 2015-06-22, 11:16 AM   Samul Dada, PT MS Acute Rehab Dept. Number: [id number] [id number] and [facility] [phone]

## 2015-06-19 ENCOUNTER — Inpatient Hospital Stay (HOSPITAL_COMMUNITY): Payer: Medicare Other

## 2015-06-19 DIAGNOSIS — R931 Abnormal findings on diagnostic imaging of heart and coronary circulation: Secondary | ICD-10-CM

## 2015-06-19 DIAGNOSIS — I519 Heart disease, unspecified: Secondary | ICD-10-CM

## 2015-06-19 LAB — NM MYOCAR MULTI W/SPECT W/WALL MOTION / EF
CHL CUP RESTING HR STRESS: 78 {beats}/min
Peak HR: 131 {beats}/min

## 2015-06-19 LAB — CBC
HCT: 37.5 % — ABNORMAL LOW (ref 39.0–52.0)
HEMOGLOBIN: 13.4 g/dL (ref 13.0–17.0)
MCH: 35.7 pg — AB (ref 26.0–34.0)
MCHC: 35.7 g/dL (ref 30.0–36.0)
MCV: 100 fL (ref 78.0–100.0)
PLATELETS: 107 10*3/uL — AB (ref 150–400)
RBC: 3.75 MIL/uL — AB (ref 4.22–5.81)
RDW: 18.9 % — AB (ref 11.5–15.5)
WBC: 7.9 10*3/uL (ref 4.0–10.5)

## 2015-06-19 LAB — CK
CK TOTAL: 207 U/L (ref 49–397)
Total CK: 213 U/L (ref 49–397)

## 2015-06-19 LAB — COMPREHENSIVE METABOLIC PANEL
ALT: 89 U/L — ABNORMAL HIGH (ref 17–63)
AST: 158 U/L — ABNORMAL HIGH (ref 15–41)
Albumin: 2.1 g/dL — ABNORMAL LOW (ref 3.5–5.0)
Alkaline Phosphatase: 154 U/L — ABNORMAL HIGH (ref 38–126)
Anion gap: 9 (ref 5–15)
BUN: 8 mg/dL (ref 6–20)
CHLORIDE: 103 mmol/L (ref 101–111)
CO2: 23 mmol/L (ref 22–32)
CREATININE: 0.8 mg/dL (ref 0.61–1.24)
Calcium: 8.3 mg/dL — ABNORMAL LOW (ref 8.9–10.3)
GFR calc non Af Amer: >60 mL/min (ref >60)
Glucose, Bld: 107 mg/dL — ABNORMAL HIGH (ref 65–99)
POTASSIUM: 3.5 mmol/L (ref 3.5–5.1)
Sodium: 135 mmol/L (ref 135–145)
TOTAL PROTEIN: 5.3 g/dL — AB (ref 6.5–8.1)
Total Bilirubin: 10.4 mg/dL — ABNORMAL HIGH (ref 0.3–1.2)

## 2015-06-19 MED ORDER — TECHNETIUM TC 99M SESTAMIBI GENERIC - CARDIOLITE
30.0000 | Freq: Once | INTRAVENOUS | Status: AC | PRN
  Administered 2015-06-19: 30 via INTRAVENOUS

## 2015-06-19 MED ORDER — TECHNETIUM TC 99M SESTAMIBI GENERIC - CARDIOLITE
10.0000 | Freq: Once | INTRAVENOUS | Status: AC | PRN
  Administered 2015-06-19: 10 via INTRAVENOUS

## 2015-06-19 MED ORDER — REGADENOSON 0.4 MG/5ML IV SOLN
0.4000 mg | Freq: Once | INTRAVENOUS | Status: AC
  Administered 2015-06-19: 0.4 mg via INTRAVENOUS
  Filled 2015-06-19: qty 5

## 2015-06-19 MED ORDER — REGADENOSON 0.4 MG/5ML IV SOLN
INTRAVENOUS | Status: AC
  Administered 2015-06-19: 0.4 mg via INTRAVENOUS
  Filled 2015-06-19: qty 5

## 2015-06-19 NOTE — Progress Notes (Signed)
Subjective: No CP or SOB  Objective: Vital signs in last 24 hours: Temp:  [97.9 F (36.6 C)] 97.9 F (36.6 C) (04/17 1424) Pulse Rate:  [70] 70 (04/17 1424) Resp:  [22] 22 (04/17 1424) BP: (119)/(82) 119/82 mmHg (04/17 1424) SpO2:  [97 %] 97 % (04/17 1424) Last BM Date: 06/18/15  Intake/Output from previous day: 04/17 0701 - 04/18 0700 In: 666 [P.O.:666] Out: 900 [Urine:900] Intake/Output this shift: Total I/O In: -  Out: 900 [Urine:900]  Medications Scheduled Meds: . folic acid  1 mg Oral Daily  . lactulose  30 g Oral TID  . lisinopril  20 mg Oral Daily  . metoprolol tartrate  12.5 mg Oral BID  . multivitamin with minerals  1 tablet Oral Daily  . pantoprazole  40 mg Oral Daily  . prednisoLONE  40 mg Oral Daily  . sodium chloride flush  3 mL Intravenous Q12H  . thiamine  100 mg Oral Daily   Continuous Infusions:  PRN Meds:.hydroxypropyl methylcellulose / hypromellose, oxyCODONE, senna-docusate  PE: General appearance: alert, cooperative and no distress.  Appears comfortable.   HEENT:  PERRLA, EOM, icteric sclera Lungs: clear to auscultation bilaterally Heart: regular rate and rhythm, S1, S2 normal, no murmur, click, rub or gallop Extremities: No LEE Pulses: 2+ and symmetric Skin: Warm, dry  Neurologic: Grossly normal  Lab Results:   Recent Labs  06/17/15 0557 06/18/15 0530 06/19/15 0539  WBC 6.0 8.6 7.9  HGB 11.9* 13.5 13.4  HCT 33.8* 37.9* 37.5*  PLT 71* 80* 107*   BMET  Recent Labs  06/17/15 0557 06/18/15 0530 06/19/15 0539  NA 134* 135 135  K 3.9 3.8 3.5  CL 104 103 103  CO2 21* 22 23  GLUCOSE 134* 99 107*  BUN 8 8 8   CREATININE 0.72 0.73 0.80  CALCIUM 8.3* 8.3* 8.3*   PT/INR  Recent Labs  06/18/15 0530  LABPROT 22.4*  INR 1.98*   Cardiac Panel (last 3 results)  Recent Labs  06/18/15 0530 06/18/15 1646 06/19/15 0539  CKTOTAL 415* 378 207      Assessment/Plan  frail 65 yo male with PMH of HTN, HLD,  thrombocytopenia, EtOH abuse and cirrhosis presented to the hospital after being found down for more than day, had significantly elevated total CK, elevated ammonia level felt to be alcoholic hepatitis, echo showed wall motion abnormality with LV dysfunction.  Principal Problem:   Altered mental status Active Problems:   Hyperbilirubinemia   Thrombocytopenia (HCC)   Hypokalemia   Hepatic cirrhosis (HCC)   Elevated troponin   Elevated CK   Fall   Alcoholic hepatitis with ascites  . LV dysfunction with inferolateral akinesis seen on echo Lexiscan completed.  Results to follow.   2. Episodic PAT: given liver issue, may consider change metoprolol to propranolol. Otherwise continue BB  3. AMS: improving  4. EtOH hepatitis with cirrhosis: seen by GI  5. UTI: per IM  6. Chronic thrombocytopenia: stable  7. HTN: controlled.  8. HLD:     LOS: 4 days    HAGER, BRYAN PA-C 06/19/2015 11:08 AM  I have seen and examined the patient along with HAGER, BRYAN PA-C.  I have reviewed the chart, notes and new data.  I agree with PA/NP's note.  Low risk nuclear stress perfusion study with normal perfusion and mildly depressed LVEF. No evidence of regional variation in perfusion or wall motion  PLAN: No plan for coronary angiography. 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 Continue lisinopril and beta blocker (option for nonselective agent if preferred by GI). No overt hypervolemia at this time. Reevaluate EF after 3 months of medical therapy and complete abstinence from alcohol.  Thurmon Fair, MD, [facility] [phone] 06/19/2015, 3:35 PM

## 2015-06-19 NOTE — Progress Notes (Signed)
[facility]                                                                              Patient Demographics  Gary [name], is a 65 y.o. male, DOB - 01-29-1951, ZOX:[id number]  Admit date - 06/14/2015   Admitting Physician Alberteen Sam, MD  Outpatient Primary MD for the patient is [name], NP  LOS - 4  days    Chief Complaint  Patient presents with  . Altered Mental Status       Brief HPI  Gary [name] is a 65 y.o. male with a past medical history significant for alcoholic cirrhosis, HTN who presented after unwitnessed fall, found down. Evidently, his wife is in [place] (he is able to sort of relate this), the patient was last seen about 3-4 days ago by neighbors. His sister had called her neighbor to check on him and patient was found face down on the floor of his home, disoriented, weak. EMS were called who found him awake, interactive, oriented only to person and place, slowly following commands, weak, dry mucous membranes, very bruised, jaundiced, incontinent of urine. In the ED, he was tachycardic, afebrile, saturating well on ambient air. Remained confused and jaundiced. Na 130, K 2.7, Cr. 1.1, TBili 13, AST/ALT 280/100, WBC 8K, Hgb 13, platetelets 71K (at baseline). CK 2000, mild troponin leak. Cards thought troponin leak was expected given found down and degree of CK elevation. CT imaging of the head, C-spine, chest abdomen and pelvis with contrast were unremarkable except for increased heterogeneity of the liver. Flat radiographs of both elbows were negative for fracture. Chest x-ray clear. Got 2L NS and ceftriaxone for UTI, and TRH were asked to evaluate for admission.     Assessment & Plan   Altered mental status: Significantly better, appears to be at his baseline now - Multifactorial, possibility of hepatic encephalopathy with cirrhosis, UTI, multiple metabolic abnormalities including hyponatremia, hypokalemia, rhabdomyolysis, ? EtOH  withdrawal - No focal neurological deficits noted, CT head negative, MRI brain negative, EEG consistent with a mild generalized nonspecific cerebral dysfunction/encephalopathy. No seizure activity noted. -  Ammonia level was elevated at 92, continue oral lactulose. - TSH normal - UA positive for UTI, follow urine cultures, continue IV Rocephin - B12 folate and normal limits - Diet advanced to solid    Hyperbilirubinemia, jaundice: Likely due to liver cirrhosis, has macrocytic anemia, thrombocytopenia, hepatic encephalopathy/hyperammonemia - CT abdomen showed hepatic cirrhosis with degenerative nodules - Ultrasound showed severe hepatic steatosis, trace amount of perisplenic ascites - on steroids for acute alcoholic hepatitis, follow AFP 2.4 - will follow GI recommendations regarding EGD and MRCP- inpatient vs outpatient  Essential hypertension, tachycardia, +troponin  - Normotensive at admission.Restarted beta blocker due to tachycardia, possibly beta blocker withdrawals, EtOH withdrawal - 2-D echo showed EF of 55-60%, no wall motion abnormalities with grade 1 diastolic dysfunction, ? Inferior wall motion abnormality, recommending Definity echo.  - Definity echo with EF 40-45%, akinesis of the entireinferolateral myocardium. Probable hypokinesis of the  entire inferior myocardium. - Continue beta blocker, lisinopril. Appreciate cardiology recommendations, stress test today  History of alcohol abuse -  Continue CIWA protocol with ativan  Rhabdomyolysis: Likely due to immobility, found down CK is trending down   Thrombocytopenia: Due to liver cirrhosis - follow counts   Hypokalemia, hypomagnesemia:  Resolved   Hyponatremia: resolved, likely due to hypovolemia and dehydration   Elevated troponin - Possible troponin leak due to acute rhabdomyolysis - 2-D echo showed EF 55-60%, normal wall motion, no regional wall motion abnormalities, grade 1 diastolic dysfunction. Possible  inferior wall motion abnormality, recommend limited study with Definity contrast which showed EF of 40-45% with diffuse hypokinesis. Cardiology consulted   Code Status: Full code   Family Communication: No family member at the bedside  Disposition Plan: PT evaluation recommended skilled nursing facility, likely DC in am   Time Spent in minutes   15 minutes  Procedures  CT abd, head MRI brain  Consults  G I  Cardiology   DVT Prophylaxis  SCD's  Medications  Scheduled Meds: . folic acid  1 mg Oral Daily  . lactulose  30 g Oral TID  . lisinopril  20 mg Oral Daily  . metoprolol tartrate  12.5 mg Oral BID  . multivitamin with minerals  1 tablet Oral Daily  . pantoprazole  40 mg Oral Daily  . prednisoLONE  40 mg Oral Daily  . sodium chloride flush  3 mL Intravenous Q12H  . thiamine  100 mg Oral Daily   Continuous Infusions:   PRN Meds:.hydroxypropyl methylcellulose / hypromellose, oxyCODONE, senna-docusate   Antibiotics   Anti-infectives    Start     Dose/Rate Route Frequency Ordered Stop   06/15/15 0530  cefTRIAXone (ROCEPHIN) 1 g in dextrose 5 % 50 mL IVPB  Status:  Discontinued     1 g 100 mL/hr over 30 Minutes Intravenous Every 24 hours 06/15/15 0517 06/18/15 1154        Subjective:   Gary [name] was seen and examined earlier today. Denies any special complaints. Alert and oriented, No chest pain, abdominal pain, shortness of breath, fevers or chills.    Objective:   Filed Vitals:   06/19/15 1112 06/19/15 1115 06/19/15 1118 06/19/15 1119  BP: 123/87 99/78 108/76 128/79  Pulse:      Temp:      TempSrc:      Resp:      Height:      Weight:      SpO2:        Intake/Output Summary (Last 24 hours) at 06/19/15 1234 Last data filed at 06/19/15 0847  Gross per 24 hour  Intake    666 ml  Output   1800 ml  Net  -1134 ml     Wt Readings from Last 3 Encounters:  06/16/15 98.1 kg (216 lb 4.3 oz)  04/05/15 83.915 kg (185 lb)  11/21/14 82.158 kg (181  lb 2 oz)     Exam  General:Alert and oriented 3, NAD, jaundice improving  HEENT: icteric Sclera,   Neck:   CVS: S1 S2 clear, RRR  Respiratory: CTAB, no rhonchi  Abdomen: Soft, nontender, nondistended, + bowel sounds  Ext: no cyanosis clubbing or edema  Neuro: no new deficits  Skin:  No rashes  Psych: mental status better today  Data Reviewed:  I have personally reviewed following labs and imaging studies  Micro Results Recent Results (from the past 240 hour(s))  Urine culture     Status: None   Collection Time: 06/14/15 11:40 PM  Result Value Ref Range Status   Specimen Description URINE, RANDOM  Final  Special Requests NONE  Final   Culture NO GROWTH 1 DAY  Final   Report Status 06/16/2015 FINAL  Final    Radiology Reports Dg Chest 1 View  06/15/2015  CLINICAL DATA:  Found down at home. EXAM: CHEST 1 VIEW COMPARISON:  Chest radiograph April 05, 2015 FINDINGS: Cardiac silhouette is mildly enlarged, mediastinal silhouette is unremarkable for this low inspiratory portable examination with crowded vasculature markings. The lungs are clear without pleural effusions or focal consolidations. Bandlike density LEFT lung base. Stable LEFT apical pleural thickening. Trachea projects midline and there is no pneumothorax. Included soft tissue planes and osseous structures are non-suspicious. Old RIGHT posterior rib fractures. Old RIGHT mid clavicle fracture. Osteopenia. IMPRESSION: Mild cardiomegaly and LEFT lung base atelectasis. Electronically Signed   By: Awilda Metro M.D.   On: 06/15/2015 01:40   Dg Elbow Complete Left  06/15/2015  CLINICAL DATA:  Larey Seat at home. EXAM: LEFT ELBOW - COMPLETE 3+ VIEW COMPARISON:  None. FINDINGS: There is no evidence of fracture, dislocation, or joint effusion. There is no evidence of arthropathy or other focal bone abnormality. Soft tissues are unremarkable. IMPRESSION: Negative. Electronically Signed   By: Ellery Plunk M.D.   On:  06/15/2015 01:42   Dg Elbow Complete Right  06/15/2015  CLINICAL DATA:  Larey Seat at home EXAM: RIGHT ELBOW - COMPLETE 3+ VIEW COMPARISON:  None. FINDINGS: There is no evidence of fracture, dislocation, or joint effusion. There is no evidence of arthropathy or other focal bone abnormality. Soft tissues are unremarkable. IMPRESSION: Negative. Electronically Signed   By: Ellery Plunk M.D.   On: 06/15/2015 01:42   Ct Head Wo Contrast  06/15/2015  CLINICAL DATA:  Found down, with bruising about the head. Concern for head or cervical spine injury. Initial encounter. EXAM: CT HEAD WITHOUT CONTRAST CT CERVICAL SPINE WITHOUT CONTRAST TECHNIQUE: Multidetector CT imaging of the head and cervical spine was performed following the standard protocol without intravenous contrast. Multiplanar CT image reconstructions of the cervical spine were also generated. COMPARISON:  None. FINDINGS: CT HEAD FINDINGS There is no evidence of acute infarction, mass lesion, or intra- or extra-axial hemorrhage on CT. Prominence of the ventricles and sulci reflects mild cortical volume loss. Mild cerebellar atrophy is noted. Mild periventricular and subcortical white matter change likely reflects small vessel ischemic microangiopathy. The brainstem and fourth ventricle are within normal limits. The basal ganglia are unremarkable in appearance. The cerebral hemispheres demonstrate grossly normal gray-white differentiation. No mass effect or midline shift is seen. There is no evidence of fracture; visualized osseous structures are unremarkable in appearance. The visualized portions of the orbits are within normal limits. Mucus retention cyst or polyp is noted at the left maxillary sinus. The remaining paranasal sinuses and mastoid air cells are well-aerated. Soft tissue swelling is noted overlying the left parietal calvarium and at the left vertex. CT CERVICAL SPINE FINDINGS There is no evidence of fracture or subluxation. Vertebral bodies  demonstrate normal height and alignment. Multilevel disc space narrowing is noted along the lower cervical spine, with small anterior and posterior disc osteophyte complexes. Prevertebral soft tissues are within normal limits. The visualized neural foramina are grossly unremarkable. The thyroid gland is unremarkable in appearance. No significant soft tissue abnormalities are seen. IMPRESSION: 1. No evidence of traumatic intracranial injury or fracture. 2. No evidence of fracture or subluxation along the cervical spine. 3. Soft tissue swelling overlying the left parietal calvarium and at the left vertex. 4. Mild cortical volume loss and scattered small vessel  ischemic microangiopathy. 5. Mild degenerative change along the lower cervical spine. 6. Mucus retention cyst or polyp at the left maxillary sinus. Electronically Signed   By: Roanna Raider M.D.   On: 06/15/2015 02:37   Ct Chest W Contrast  06/15/2015  CLINICAL DATA:  Found down with chest and abdominal bruising. Initial encounter. EXAM: CT CHEST, ABDOMEN, AND PELVIS WITH CONTRAST TECHNIQUE: Multidetector CT imaging of the chest, abdomen and pelvis was performed following the standard protocol during bolus administration of intravenous contrast. CONTRAST:  100 mL ISOVUE-300 IOPAMIDOL (ISOVUE-300) INJECTION 61% COMPARISON:  CT of the chest, abdomen and pelvis performed 04/05/2015 FINDINGS: CT CHEST Minimal bibasilar atelectasis is noted. The lungs are otherwise clear. No pleural effusion or pneumothorax is seen. No masses are identified. There is no evidence of pulmonary parenchymal contusion. Scattered coronary artery calcifications are seen. No pericardial effusion is identified. No mediastinal lymphadenopathy is seen. There is no evidence of venous hemorrhage. The great vessels are grossly unremarkable in appearance. Mild calcification is seen along the aortic arch. The visualized portions of thyroid gland are unremarkable. No axillary lymphadenopathy is  seen. There is no evidence of significant soft tissue injury along the chest wall. No acute osseous abnormalities are identified. Chronic right-sided rib deformities are noted. CT ABDOMEN AND PELVIS No free air or free fluid is seen within the abdomen or pelvis. There is no evidence of solid or hollow organ injury. There is a diffusely heterogeneous appearance to the liver, with multiple areas of decreased attenuation. Underlying mass cannot be excluded. The appearance raises question for hepatic cirrhosis and regenerative nodules. The gallbladder is within normal limits. The pancreas and adrenal glands are unremarkable. There is recanalization of the umbilical vein, extending to the vessels of the anterior abdominal wall. Scattered gastric and esophageal varices are seen. A tiny umbilical hernia is noted, containing only fat. The kidneys are unremarkable in appearance. There is no evidence of hydronephrosis. No renal or ureteral stones are seen. Mild nonspecific perinephric stranding is noted bilaterally. No free fluid is identified. The small bowel is unremarkable in appearance. The stomach is within normal limits. No acute vascular abnormalities are seen. Mild scattered calcification is seen along the abdominal aorta and its branches. The appendix is not definitely characterized; there is no evidence of appendicitis. Scattered diverticulosis is noted along the descending and sigmoid colon, without evidence of diverticulitis. The bladder is decompressed, with a Foley catheter in place. The prostate is borderline prominent. No inguinal lymphadenopathy is seen. No acute osseous abnormalities are identified. 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 IMPRESSION: 1. No evidence of traumatic injury to the chest, abdomen or pelvis. 2. Changes of hepatic cirrhosis, with underlying diffuse heterogeneity of the liver, concerning for degenerative nodules. Heterogeneity is more prominent than on the prior study.  Underlying mass cannot be excluded. Dynamic liver protocol MRI or CT would be helpful for further evaluation. 3. Minimal bibasilar atelectasis noted.  Lungs otherwise clear. 4. Scattered coronary artery calcifications seen. 5. Recanalization of the umbilical vein, extending to the vessels of the anterior abdominal wall. Scattered gastric and esophageal varices noted. 6. Tiny umbilical hernia, containing only fat. 7. Mild scattered calcification along the abdominal aorta and its branches. 8. Scattered diverticulosis along the descending and sigmoid colon, without evidence of diverticulitis. 9. 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 Electronically Signed   By: Roanna Raider M.D.   On: 06/15/2015 02:47   Ct Cervical Spine Wo Contrast  06/15/2015  CLINICAL DATA:  Found  down, with bruising about the head. Concern for head or cervical spine injury. Initial encounter. EXAM: CT HEAD WITHOUT CONTRAST CT CERVICAL SPINE WITHOUT CONTRAST TECHNIQUE: Multidetector CT imaging of the head and cervical spine was performed following the standard protocol without intravenous contrast. Multiplanar CT image reconstructions of the cervical spine were also generated. COMPARISON:  None. FINDINGS: CT HEAD FINDINGS There is no evidence of acute infarction, mass lesion, or intra- or extra-axial hemorrhage on CT. Prominence of the ventricles and sulci reflects mild cortical volume loss. Mild cerebellar atrophy is noted. Mild periventricular and subcortical white matter change likely reflects small vessel ischemic microangiopathy. The brainstem and fourth ventricle are within normal limits. The basal ganglia are unremarkable in appearance. The cerebral hemispheres demonstrate grossly normal gray-white differentiation. No mass effect or midline shift is seen. There is no evidence of fracture; visualized osseous structures are unremarkable in appearance. The visualized portions of the orbits are within normal limits. Mucus retention cyst or  polyp is noted at the left maxillary sinus. The remaining paranasal sinuses and mastoid air cells are well-aerated. Soft tissue swelling is noted overlying the left parietal calvarium and at the left vertex. CT CERVICAL SPINE FINDINGS There is no evidence of fracture or subluxation. Vertebral bodies demonstrate normal height and alignment. Multilevel disc space narrowing is noted along the lower cervical spine, with small anterior and posterior disc osteophyte complexes. Prevertebral soft tissues are within normal limits. The visualized neural foramina are grossly unremarkable. The thyroid gland is unremarkable in appearance. No significant soft tissue abnormalities are seen. IMPRESSION: 1. No evidence of traumatic intracranial injury or fracture. 2. No evidence of fracture or subluxation along the cervical spine. 3. Soft tissue swelling overlying the left parietal calvarium and at the left vertex. 4. Mild cortical volume loss and scattered small vessel ischemic microangiopathy. 5. Mild degenerative change along the lower cervical spine. 6. Mucus retention cyst or polyp at the left maxillary sinus. Electronically Signed   By: Roanna Raider M.D.   On: 06/15/2015 02:37   Mr Brain Wo Contrast  06/15/2015  CLINICAL DATA:  Chief complaint. Fall with confusion. Alcoholic cirrhosis. Encephalopathy. EXAM: MRI HEAD WITHOUT CONTRAST TECHNIQUE: Multiplanar, multiecho pulse sequences of the brain and surrounding structures were obtained without intravenous contrast. COMPARISON:  CT head earlier today. FINDINGS: No evidence for acute infarction, hemorrhage, mass lesion, hydrocephalus, or extra-axial fluid. Generalized atrophy. Mild T2 and FLAIR hyperintensities, nonspecific, likely chronic microvascular ischemic change. 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 Prominent Perivascular spaces. Flow voids are maintained. No chronic  hemorrhage. T1 weighted images do not show characteristic features of hepatic encephalopathy. No midline abnormality. Extracranial soft tissues unremarkable, except for a LEFT parietal vertex scalp hematoma. IMPRESSION: No intracranial posttraumatic sequelae are evident. There is advanced atrophy with chronic microvascular ischemic change. No specific features of hepatic encephalopathy are observed. Electronically Signed   By: Elsie Stain M.D.   On: 06/15/2015 15:56   US Abdomen Complete  06/16/2015  CLINICAL DATA:  65 year old male with hyperbilirubinemia. Alcohol abuse. Acute hepatitis. EXAM: ABDOMEN ULTRASOUND COMPLETE COMPARISON:  No priors.  CT of the abdomen and pelvis 06/15/2015. FINDINGS: Gallbladder: No gallstones or wall thickening visualized. No sonographic Murphy sign noted by sonographer. Common bile duct:  Could not be visualized. Liver: Heterogeneous in echotexture, which generally increased echogenicity, compatible with severe hepatic steatosis. No discrete mass confidently identified. No intrahepatic biliary ductal dilatation noted. IVC: No abnormality visualized. Pancreas: Visualized  portion unremarkable. Spleen: Size and appearance within normal limits.  9.7 cm in length. Right Kidney: Length: 11.9 cm. Echogenicity within normal limits. No mass or hydronephrosis visualized. Left Kidney: Length: 12.0 cm. Echogenicity within normal limits. No mass or hydronephrosis visualized. Abdominal aorta: No aneurysm visualized. Other findings: Trace amount of perisplenic ascites. IMPRESSION: 1. Severe hepatic steatosis. 2. Trace amount of perisplenic ascites. Electronically Signed   By: Trudie Reed M.D.   On: 06/16/2015 10:04   Ct Abdomen Pelvis W Contrast  06/15/2015  CLINICAL DATA:  Found down with chest and abdominal bruising. Initial encounter. EXAM: CT CHEST, ABDOMEN, AND PELVIS WITH CONTRAST TECHNIQUE: Multidetector CT imaging of the chest, abdomen and pelvis was performed following the  standard protocol during bolus administration of intravenous contrast. CONTRAST:  100 mL ISOVUE-300 IOPAMIDOL (ISOVUE-300) INJECTION 61% COMPARISON:  CT of the chest, abdomen and pelvis performed 04/05/2015 FINDINGS: CT CHEST Minimal bibasilar atelectasis is noted. The lungs are otherwise clear. No pleural effusion or pneumothorax is seen. No masses are identified. There is no evidence of pulmonary parenchymal contusion. Scattered coronary artery calcifications are seen. No pericardial effusion is identified. No mediastinal lymphadenopathy is seen. There is no evidence of venous hemorrhage. The great vessels are grossly unremarkable in appearance. Mild calcification is seen along the aortic arch. The visualized portions of thyroid gland are unremarkable. No axillary lymphadenopathy is seen. There is no evidence of significant soft tissue injury along the chest wall. No acute osseous abnormalities are identified. Chronic right-sided rib deformities are noted. CT ABDOMEN AND PELVIS No free air or free fluid is seen within the abdomen or pelvis. There is no evidence of solid or hollow organ injury. There is a diffusely heterogeneous appearance to the liver, with multiple areas of decreased attenuation. Underlying mass cannot be excluded. The appearance raises question for hepatic cirrhosis and regenerative nodules. The gallbladder is within normal limits. The pancreas and adrenal glands are unremarkable. There is recanalization of the umbilical vein, extending to the vessels of the anterior abdominal wall. Scattered gastric and esophageal varices are seen. A tiny umbilical hernia is noted, containing only fat. The kidneys are unremarkable in appearance. There is no evidence of hydronephrosis. No renal or ureteral stones are seen. Mild nonspecific perinephric stranding is noted bilaterally. No free fluid is identified. The small bowel is unremarkable in appearance. The stomach is within normal limits. No acute vascular  abnormalities are seen. Mild scattered calcification is seen along the abdominal aorta and its branches. The appendix is not definitely characterized; there is no evidence of appendicitis. Scattered diverticulosis is noted along the descending and sigmoid colon, without evidence of diverticulitis. The bladder is decompressed, with a Foley catheter in place. The prostate is borderline prominent. No inguinal lymphadenopathy is seen. No acute osseous abnormalities are identified. 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 IMPRESSION: 1. No evidence of traumatic injury to the chest, abdomen or pelvis. 2. Changes of hepatic cirrhosis, with underlying diffuse heterogeneity of the liver, concerning for degenerative nodules. Heterogeneity is more prominent than on the prior study. Underlying mass cannot be excluded. Dynamic liver protocol MRI or CT would be helpful for further evaluation. 3. Minimal bibasilar atelectasis noted.  Lungs otherwise clear. 4. Scattered coronary artery calcifications seen. 5. Recanalization of the umbilical vein, extending to the vessels of the anterior abdominal wall. Scattered gastric and esophageal varices noted. 6. Tiny umbilical hernia, containing only fat. 7. Mild scattered calcification along the abdominal aorta and its branches. 8. Scattered diverticulosis along  the descending and sigmoid colon, without evidence of diverticulitis. 9. 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 Electronically Signed   By: Roanna Raider M.D.   On: 06/15/2015 02:47    CBC  Recent Labs Lab 06/14/15 2237 06/15/15 0621 06/16/15 0538 06/17/15 0557 06/18/15 0530 06/19/15 0539  WBC 8.0 7.0 6.0 6.0 8.6 7.9  HGB 13.2 11.7* 11.6* 11.9* 13.5 13.4  HCT 36.5* 32.6* 33.6* 33.8* 37.9* 37.5*  PLT 71* 60* 64* 71* 80* 107*  MCV 99.7 100.6* 99.4 99.7 103.0* 100.0  MCH 36.1* 36.1* 34.3* 35.1* 36.7* 35.7*  MCHC 36.2* 35.9 34.5 35.2 35.6 35.7  RDW 15.0 15.4 16.2* 16.9* 18.3* 18.9*  LYMPHSABS 0.5*  --   --    --   --   --   MONOABS 1.2*  --   --   --   --   --   EOSABS 0.0  --   --   --   --   --   BASOSABS 0.0  --   --   --   --   --     Chemistries   Recent Labs Lab 06/14/15 2330 06/15/15 0621 06/16/15 0538 06/16/15 0957 06/16/15 1936 06/17/15 0557 06/18/15 0530 06/19/15 0539  NA  --  135 135  --   --  134* 135 135  K  --  3.0* 2.7*  --  3.1* 3.9 3.8 3.5  CL  --  98* 102  --   --  104 103 103  CO2  --  21* 23  --   --  21* 22 23  GLUCOSE  --  101* 103*  --   --  134* 99 107*  BUN  --  14 11  --   --  CREATININE  --  1.03 0.74  --   --  0.72 0.73 0.80  CALCIUM  --  8.0* 8.0*  --   --  8.3* 8.3* 8.3*  MG 1.9  --   --  1.5*  --  1.9  --   --   AST  --  274* 247*  --   --  216* 190* 158*  ALT  --  92* 85*  --   --  86* 93* 89*  ALKPHOS  --  122 126  --   --  125 154* 154*  BILITOT  --  13.1* 13.1*  --   --  13.9* 11.9* 10.4*   ------------------------------------------------------------------------------------------------------------------ estimated creatinine clearance is 108.1 mL/min (by C-G formula based on Cr of 0.8). ------------------------------------------------------------------------------------------------------------------ No results for input(s): HGBA1C in the last 72 hours. ------------------------------------------------------------------------------------------------------------------ No results for input(s): CHOL, HDL, LDLCALC, TRIG, CHOLHDL, LDLDIRECT in the last 72 hours. ------------------------------------------------------------------------------------------------------------------ No results for input(s): TSH, T4TOTAL, T3FREE, THYROIDAB in the last 72 hours.  Invalid input(s): FREET3 ------------------------------------------------------------------------------------------------------------------ No results for input(s): VITAMINB12, FOLATE, FERRITIN, TIBC, IRON, RETICCTPCT in the last 72 hours.  Coagulation profile  Recent Labs Lab 06/16/15 0538  06/18/15 0530  INR 2.19* 1.98*    No results for input(s): DDIMER in the last 72 hours.  Cardiac Enzymes  Recent Labs Lab 06/15/15 0621 06/15/15 1126 06/15/15 1704  TROPONINI 0.10* 0.11* 0.08*   ------------------------------------------------------------------------------------------------------------------ Invalid input(s): POCBNP  No results for input(s): GLUCAP in the last 72 hours.   RAI,RIPUDEEP M.D. [facility] 06/19/2015, 12:34 PM  Pager: [phone] Between 7am to 7pm - call Pager - [phone]  After 7pm go to [email or web address] - password [email or web address]  Call night coverage person covering  after 7pm

## 2015-06-19 NOTE — Progress Notes (Signed)
UR COMPLETED  

## 2015-06-19 NOTE — Progress Notes (Signed)
Patient states he did no have a urinary catheter at home. Appears catheter was placed in ED. MD paged to clarify if foley should be d/c. Dr. Isidoro Donning verbal order to d/c catheter and monitor.

## 2015-06-20 ENCOUNTER — Telehealth: Payer: Self-pay | Admitting: Internal Medicine

## 2015-06-20 LAB — CK: CK TOTAL: 147 U/L (ref 49–397)

## 2015-06-20 MED ORDER — PROPRANOLOL HCL 10 MG PO TABS: 10.0000 mg | ORAL_TABLET | Freq: Two times a day (BID) | ORAL | Status: DC

## 2015-06-20 MED ORDER — SPIRONOLACTONE 50 MG PO TABS: 50.0000 mg | ORAL_TABLET | Freq: Every day | ORAL | Status: DC

## 2015-06-20 MED ORDER — HYPROMELLOSE (GONIOSCOPIC) 2.5 % OP SOLN: 1.0000 [drp] | Freq: Four times a day (QID) | OPHTHALMIC | Status: AC | PRN

## 2015-06-20 MED ORDER — LISINOPRIL 10 MG PO TABS
10.0000 mg | ORAL_TABLET | Freq: Every day | ORAL | Status: DC
Start: 2015-06-20 — End: 2016-09-17

## 2015-06-20 MED ORDER — PREDNISOLONE 5 MG PO TABS: 40.0000 mg | ORAL_TABLET | Freq: Every day | ORAL | Status: DC

## 2015-06-20 MED ORDER — THIAMINE HCL 100 MG PO TABS: 100.0000 mg | ORAL_TABLET | Freq: Every day | ORAL | Status: DC

## 2015-06-20 MED ORDER — OXYCODONE HCL 5 MG PO TABS: 5.0000 mg | ORAL_TABLET | Freq: Four times a day (QID) | ORAL | Status: DC | PRN

## 2015-06-20 MED ORDER — PROPRANOLOL HCL 10 MG PO TABS
10.0000 mg | ORAL_TABLET | Freq: Two times a day (BID) | ORAL | Status: DC
Start: 2015-06-20 — End: 2015-06-20
  Filled 2015-06-20 (×3): qty 1

## 2015-06-20 MED ORDER — LISINOPRIL 10 MG PO TABS: 10.0000 mg | ORAL_TABLET | Freq: Every day | ORAL | Status: DC

## 2015-06-20 NOTE — Discharge Summary (Signed)
Physician Discharge Summary   Patient ID: Gary [name] MRN: [id number] DOB/AGE: 1950-06-23 65 y.o.  Admit date: 06/14/2015 Discharge date: 06/20/2015  Primary Care Physician:  [name], NP  Discharge Diagnoses:    . Acute encephalopathy, likely due to hepatic encephalopathy    Acute alcoholic hepatitis  . Thrombocytopenia (HCC) . Hepatic cirrhosis (HCC) . Elevated troponin . Hypokalemia . Elevated CK . Hyperbilirubinemia . Fall  Consults:   Gastroenterology Cardiology  Recommendations for Outpatient Follow-up:  1. Please repeat CBC/CMET at next visit 2. Please titrate lactulose for 3-4 bowel movements in 24 hours 3.  Patient has appointment with gastroenterology for steroid taper   DIET: Heart healthy diet    Allergies:  No Known Allergies   DISCHARGE MEDICATIONS: Current Discharge Medication List    START taking these medications   Details  hydroxypropyl methylcellulose / hypromellose (ISOPTO TEARS / GONIOVISC) 2.5 % ophthalmic solution Place 1 drop into both eyes 4 (four) times daily as needed for dry eyes. Qty: 15 mL, Refills: 12    oxyCODONE (OXY IR/ROXICODONE) 5 MG immediate release tablet Take 1 tablet (5 mg total) by mouth every 6 (six) hours as needed for severe pain. Qty: 20 tablet, Refills: 0    prednisoLONE 5 MG TABS tablet Take 8 tablets (40 mg total) by mouth daily. For 28 days then will be tapered by GI Qty: 90 each, Refills: 0    propranolol (INDERAL) 10 MG tablet Take 1 tablet (10 mg total) by mouth 2 (two) times daily.    spironolactone (ALDACTONE) 50 MG tablet Take 1 tablet (50 mg total) by mouth daily.      CONTINUE these medications which have CHANGED   Details  lisinopril (PRINIVIL,ZESTRIL) 10 MG tablet Take 1 tablet (10 mg total) by mouth daily.    thiamine 100 MG tablet Take 1 tablet (100 mg total) by mouth daily.      CONTINUE these medications which have NOT CHANGED   Details  feeding supplement, ENSURE COMPLETE, (ENSURE  COMPLETE) LIQD Take 237 mLs by mouth 2 (two) times daily between meals.    folic acid (FOLVITE) 1 MG tablet Take 1 mg by mouth daily.    lactulose (CHRONULAC) 10 GM/15ML solution Take 45 mLs (30 g total) by mouth 3 (three) times daily. Qty: 240 mL, Refills: 0    Multiple Vitamin (MULTIVITAMIN WITH MINERALS) TABS tablet Take 1 tablet by mouth daily.    Omega-3 Fatty Acids (FISH OIL PO) Take 1 capsule by mouth daily.    pantoprazole (PROTONIX) 40 MG tablet Take 1 tablet (40 mg total) by mouth daily. Qty: 30 tablet, Refills: 1    Vitamin D, Ergocalciferol, (DRISDOL) 50000 UNITS CAPS capsule Take 50,000 Units by mouth every 7 (seven) days.       STOP taking these medications     acetaminophen (TYLENOL) 500 MG tablet      allopurinol (ZYLOPRIM) 100 MG tablet      furosemide (LASIX) 40 MG tablet      magnesium oxide (MAG-OX) 400 (241.3 MG) MG tablet      metoprolol tartrate (LOPRESSOR) 25 MG tablet      potassium chloride SA (K-DUR,KLOR-CON) 20 MEQ tablet          Brief H and P: For complete details please refer to admission H and P, but in brief Gary [name] is a 65 y.o. male with a past medical history significant for alcoholic cirrhosis, HTN who presented after unwitnessed fall, found down. Evidently, his wife  is in [place] (he is able to sort of relate this), the patient was last seen about 3-4 days ago by neighbors. His sister had called her neighbor to check on him and patient was found face down on the floor of his home, disoriented, weak. EMS were called who found him awake, interactive, oriented only to person and place, slowly following commands, weak, dry mucous membranes, very bruised, jaundiced, incontinent of urine. In the ED, he was tachycardic, afebrile, saturating well on ambient air. Remained confused and jaundiced. Na 130, K 2.7, Cr. 1.1, TBili 13, AST/ALT 280/100, WBC 8K, Hgb 13, platetelets 71K (at baseline). CK 2000, mild troponin leak. Cards thought troponin  leak was expected given found down and degree of CK elevation. CT imaging of the head, C-spine, chest abdomen and pelvis with contrast were unremarkable except for increased heterogeneity of the liver. Flat radiographs of both elbows were negative for fracture. Chest x-ray clear. Got 2L NS and ceftriaxone for UTI, and TRH were asked to evaluate for admission.  Hospital Course:   Altered mental status: Significantly better, appears to be at his baseline now - Multifactorial, possibility of hepatic encephalopathy with cirrhosis, multiple metabolic abnormalities including hyponatremia, hypokalemia, rhabdomyolysis,  EtOH withdrawal - No focal neurological deficits noted, CT head negative, MRI brain negative, EEG consistent with a mild generalized nonspecific cerebral dysfunction/encephalopathy. No seizure activity noted. - Ammonia level was elevated at 92, patient was placed on oral lactulose, titrate for 3 to 4 BM/24hrs - TSH normal - Initially started on IV Rocephin, however urine culture was negative and hence antibiotics discontinued - B12 folate and normal limits - Diet advanced to solid , patient tolerating   Hyperbilirubinemia, jaundice: Likely due to liver cirrhosis, ascites has macrocytic anemia, thrombocytopenia, hepatic encephalopathy/hyperammonemia - CT abdomen showed hepatic cirrhosis with degenerative nodules - Ultrasound showed severe hepatic steatosis, trace amount of perisplenic ascites - on steroids for acute alcoholic hepatitis, patient has GI appointment for further steroid taper outpatient , continue current dose for 28 days. - AFP 2.4 - Placed on propranolol, Aldactone 50 mg daily. Follow potassium level.   Essential hypertension, tachycardia, +troponin  - Normotensive at admission.Restarted beta blocker due to tachycardia, possibly beta blocker withdrawals, EtOH withdrawal - 2-D echo showed EF of 55-60%, no wall motion abnormalities with grade 1 diastolic dysfunction,  ? Inferior wall motion abnormality, recommending Definity echo.  - Definity echo with EF 40-45%, akinesis of the entireinferolateral myocardium. Probable hypokinesis of the  entire inferior myocardium. - Continue beta blocker, lisinopril (decreased to 10 mg daily due to addition of Aldactone). - Cardiology was consulted, patient underwent nuclear medicine stress test which showed no reversible ischemia, normal left ventricle wall motion, EF 47%.  History of alcohol abuse - Continued CIWA protocol with ativan, patient currently stable  Rhabdomyolysis: Likely due to immobility, found down CK is trending down  Thrombocytopenia: Due to liver cirrhosis - follow counts   Hypokalemia, hypomagnesemia:  Resolved   Hyponatremia: resolved, likely due to hypovolemia and dehydration   Elevated troponin - Possible troponin leak due to acute rhabdomyolysis - 2-D echo showed EF 55-60%, normal wall motion, no regional wall motion abnormalities, grade 1 diastolic dysfunction. Possible inferior wall motion abnormality, recommend limited study with Definity contrast which showed EF of 40-45% with diffuse hypokinesis. Cardiology consulted, patient underwent nuclear medicine stress test which was low risk, EF 47%.     Day of Discharge BP 109/75 mmHg  Pulse 79  Temp(Src) 98 F (36.7 C) (Axillary)  Resp 18  Ht 5\' 10"  (1.778 m)  Wt 98.1 kg (216 lb 4.3 oz)  BMI 31.03 kg/m2  SpO2 99%  Physical Exam: General: Alert and awake oriented x3 not in any acute distress. HEENT: icteric sclera, pupils reactive to light and accommodation CVS: S1-S2 clear no murmur rubs or gallops Chest: clear to auscultation bilaterally, no wheezing rales or rhonchi Abdomen: soft nontender, distended, normal bowel sounds Extremities: no cyanosis, clubbing or edema noted bilaterally Neuro: Cranial nerves II-XII intact, no focal neurological deficits   The results of significant diagnostics from this hospitalization  (including imaging, microbiology, ancillary and laboratory) are listed below for reference.    LAB RESULTS: Basic Metabolic Panel:  Recent Labs Lab 06/17/15 0557 06/18/15 0530 06/19/15 0539  NA 134* 135 135  K 3.9 3.8 3.5  CL 104 103 103  CO2 21* 22 23  GLUCOSE 134* 99 107*  BUN 8 8 8   CREATININE 0.72 0.73 0.80  CALCIUM 8.3* 8.3* 8.3*  MG 1.9  --   --    Liver Function Tests:  Recent Labs Lab 06/18/15 0530 06/19/15 0539  AST 190* 158*  ALT 93* 89*  ALKPHOS 154* 154*  BILITOT 11.9* 10.4*  PROT 5.8* 5.3*  ALBUMIN 2.3* 2.1*   No results for input(s): LIPASE, AMYLASE in the last 168 hours.  Recent Labs Lab 06/15/15 0621 06/16/15 0957  AMMONIA 92* 72*   CBC:  Recent Labs Lab 06/14/15 2237  06/18/15 0530 06/19/15 0539  WBC 8.0  < > 8.6 7.9  NEUTROABS 6.3  --   --   --   HGB 13.2  < > 13.5 13.4  HCT 36.5*  < > 37.9* 37.5*  MCV 99.7  < > 103.0* 100.0  PLT 71*  < > 80* 107*  < > = values in this interval not displayed. Cardiac Enzymes:  Recent Labs Lab 06/15/15 1126 06/15/15 1704  06/19/15 1755 06/20/15 0528  CKTOTAL  --  1846*  < > 213 147  TROPONINI 0.11* 0.08*  --   --   --   < > = values in this interval not displayed. BNP: Invalid input(s): POCBNP CBG: No results for input(s): GLUCAP in the last 168 hours.  Significant Diagnostic Studies:  Dg Chest 1 View  06/15/2015  CLINICAL DATA:  Found down at home. EXAM: CHEST 1 VIEW COMPARISON:  Chest radiograph April 05, 2015 FINDINGS: Cardiac silhouette is mildly enlarged, mediastinal silhouette is unremarkable for this low inspiratory portable examination with crowded vasculature markings. The lungs are clear without pleural effusions or focal consolidations. Bandlike density LEFT lung base. Stable LEFT apical pleural thickening. Trachea projects midline and there is no pneumothorax. Included soft tissue planes and osseous structures are non-suspicious. Old RIGHT posterior rib fractures. Old RIGHT mid  clavicle fracture. Osteopenia. IMPRESSION: Mild cardiomegaly and LEFT lung base atelectasis. Electronically Signed   By: Awilda Metro M.D.   On: 06/15/2015 01:40   Dg Elbow Complete Left  06/15/2015  CLINICAL DATA:  Larey Seat at home. EXAM: LEFT ELBOW - COMPLETE 3+ VIEW COMPARISON:  None. FINDINGS: There is no evidence of fracture, dislocation, or joint effusion. There is no evidence of arthropathy or other focal bone abnormality. Soft tissues are unremarkable. IMPRESSION: Negative. Electronically Signed   By: Ellery Plunk M.D.   On: 06/15/2015 01:42   Dg Elbow Complete Right  06/15/2015  CLINICAL DATA:  Larey Seat at home EXAM: RIGHT ELBOW - COMPLETE 3+ VIEW COMPARISON:  None. FINDINGS: There is no evidence of fracture, dislocation, or joint effusion.  There is no evidence of arthropathy or other focal bone abnormality. Soft tissues are unremarkable. IMPRESSION: Negative. Electronically Signed   By: Ellery Plunk M.D.   On: 06/15/2015 01:42   Ct Head Wo Contrast  06/15/2015  CLINICAL DATA:  Found down, with bruising about the head. Concern for head or cervical spine injury. Initial encounter. EXAM: CT HEAD WITHOUT CONTRAST CT CERVICAL SPINE WITHOUT CONTRAST TECHNIQUE: Multidetector CT imaging of the head and cervical spine was performed following the standard protocol without intravenous contrast. Multiplanar CT image reconstructions of the cervical spine were also generated. COMPARISON:  None. FINDINGS: CT HEAD FINDINGS There is no evidence of acute infarction, mass lesion, or intra- or extra-axial hemorrhage on CT. Prominence of the ventricles and sulci reflects mild cortical volume loss. Mild cerebellar atrophy is noted. Mild periventricular and subcortical white matter change likely reflects small vessel ischemic microangiopathy. The brainstem and fourth ventricle are within normal limits. The basal ganglia are unremarkable in appearance. The cerebral hemispheres demonstrate grossly normal gray-white  differentiation. No mass effect or midline shift is seen. There is no evidence of fracture; visualized osseous structures are unremarkable in appearance. The visualized portions of the orbits are within normal limits. Mucus retention cyst or polyp is noted at the left maxillary sinus. The remaining paranasal sinuses and mastoid air cells are well-aerated. Soft tissue swelling is noted overlying the left parietal calvarium and at the left vertex. CT CERVICAL SPINE FINDINGS There is no evidence of fracture or subluxation. Vertebral bodies demonstrate normal height and alignment. Multilevel disc space narrowing is noted along the lower cervical spine, with small anterior and posterior disc osteophyte complexes. Prevertebral soft tissues are within normal limits. The visualized neural foramina are grossly unremarkable. The thyroid gland is unremarkable in appearance. No significant soft tissue abnormalities are seen. IMPRESSION: 1. No evidence of traumatic intracranial injury or fracture. 2. No evidence of fracture or subluxation along the cervical spine. 3. Soft tissue swelling overlying the left parietal calvarium and at the left vertex. 4. Mild cortical volume loss and scattered small vessel ischemic microangiopathy. 5. Mild degenerative change along the lower cervical spine. 6. Mucus retention cyst or polyp at the left maxillary sinus. Electronically Signed   By: Roanna Raider M.D.   On: 06/15/2015 02:37   Ct Chest W Contrast  06/15/2015  CLINICAL DATA:  Found down with chest and abdominal bruising. Initial encounter. EXAM: CT CHEST, ABDOMEN, AND PELVIS WITH CONTRAST TECHNIQUE: Multidetector CT imaging of the chest, abdomen and pelvis was performed following the standard protocol during bolus administration of intravenous contrast. CONTRAST:  100 mL ISOVUE-300 IOPAMIDOL (ISOVUE-300) INJECTION 61% COMPARISON:  CT of the chest, abdomen and pelvis performed 04/05/2015 FINDINGS: CT CHEST Minimal bibasilar  atelectasis is noted. The lungs are otherwise clear. No pleural effusion or pneumothorax is seen. No masses are identified. There is no evidence of pulmonary parenchymal contusion. Scattered coronary artery calcifications are seen. No pericardial effusion is identified. No mediastinal lymphadenopathy is seen. There is no evidence of venous hemorrhage. The great vessels are grossly unremarkable in appearance. Mild calcification is seen along the aortic arch. The visualized portions of thyroid gland are unremarkable. No axillary lymphadenopathy is seen. There is no evidence of significant soft tissue injury along the chest wall. No acute osseous abnormalities are identified. Chronic right-sided rib deformities are noted. CT ABDOMEN AND PELVIS No free air or free fluid is seen within the abdomen or pelvis. There is no evidence of solid or hollow organ injury. There is  a diffusely heterogeneous appearance to the liver, with multiple areas of decreased attenuation. Underlying mass cannot be excluded. The appearance raises question for hepatic cirrhosis and regenerative nodules. The gallbladder is within normal limits. The pancreas and adrenal glands are unremarkable. There is recanalization of the umbilical vein, extending to the vessels of the anterior abdominal wall. Scattered gastric and esophageal varices are seen. A tiny umbilical hernia is noted, containing only fat. The kidneys are unremarkable in appearance. There is no evidence of hydronephrosis. No renal or ureteral stones are seen. Mild nonspecific perinephric stranding is noted bilaterally. No free fluid is identified. The small bowel is unremarkable in appearance. The stomach is within normal limits. No acute vascular abnormalities are seen. Mild scattered calcification is seen along the abdominal aorta and its branches. The appendix is not definitely characterized; there is no evidence of appendicitis. Scattered diverticulosis is noted along the descending  and sigmoid colon, without evidence of diverticulitis. The bladder is decompressed, with a Foley catheter in place. The prostate is borderline prominent. No inguinal lymphadenopathy is seen. No acute osseous abnormalities are identified. 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 IMPRESSION: 1. No evidence of traumatic injury to the chest, abdomen or pelvis. 2. Changes of hepatic cirrhosis, with underlying diffuse heterogeneity of the liver, concerning for degenerative nodules. Heterogeneity is more prominent than on the prior study. Underlying mass cannot be excluded. Dynamic liver protocol MRI or CT would be helpful for further evaluation. 3. Minimal bibasilar atelectasis noted.  Lungs otherwise clear. 4. Scattered coronary artery calcifications seen. 5. Recanalization of the umbilical vein, extending to the vessels of the anterior abdominal wall. Scattered gastric and esophageal varices noted. 6. Tiny umbilical hernia, containing only fat. 7. Mild scattered calcification along the abdominal aorta and its branches. 8. Scattered diverticulosis along the descending and sigmoid colon, without evidence of diverticulitis. 9. 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 Electronically Signed   By: Roanna Raider M.D.   On: 06/15/2015 02:47   Ct Cervical Spine Wo Contrast  06/15/2015  CLINICAL DATA:  Found down, with bruising about the head. Concern for head or cervical spine injury. Initial encounter. EXAM: CT HEAD WITHOUT CONTRAST CT CERVICAL SPINE WITHOUT CONTRAST TECHNIQUE: Multidetector CT imaging of the head and cervical spine was performed following the standard protocol without intravenous contrast. Multiplanar CT image reconstructions of the cervical spine were also generated. COMPARISON:  None. FINDINGS: CT HEAD FINDINGS There is no evidence of acute infarction, mass lesion, or intra- or extra-axial hemorrhage on CT. Prominence of the ventricles and sulci reflects mild cortical volume loss. Mild cerebellar  atrophy is noted. Mild periventricular and subcortical white matter change likely reflects small vessel ischemic microangiopathy. The brainstem and fourth ventricle are within normal limits. The basal ganglia are unremarkable in appearance. The cerebral hemispheres demonstrate grossly normal gray-white differentiation. No mass effect or midline shift is seen. There is no evidence of fracture; visualized osseous structures are unremarkable in appearance. The visualized portions of the orbits are within normal limits. Mucus retention cyst or polyp is noted at the left maxillary sinus. The remaining paranasal sinuses and mastoid air cells are well-aerated. Soft tissue swelling is noted overlying the left parietal calvarium and at the left vertex. CT CERVICAL SPINE FINDINGS There is no evidence of fracture or subluxation. Vertebral bodies demonstrate normal height and alignment. Multilevel disc space narrowing is noted along the lower cervical spine, with small anterior and posterior disc osteophyte complexes. Prevertebral soft tissues are within normal limits. The  visualized neural foramina are grossly unremarkable. The thyroid gland is unremarkable in appearance. No significant soft tissue abnormalities are seen. IMPRESSION: 1. No evidence of traumatic intracranial injury or fracture. 2. No evidence of fracture or subluxation along the cervical spine. 3. Soft tissue swelling overlying the left parietal calvarium and at the left vertex. 4. Mild cortical volume loss and scattered small vessel ischemic microangiopathy. 5. Mild degenerative change along the lower cervical spine. 6. Mucus retention cyst or polyp at the left maxillary sinus. Electronically Signed   By: Roanna Raider M.D.   On: 06/15/2015 02:37   Mr Brain Wo Contrast  06/15/2015  CLINICAL DATA:  Chief complaint. Fall with confusion. Alcoholic cirrhosis. Encephalopathy. EXAM: MRI HEAD WITHOUT CONTRAST TECHNIQUE: Multiplanar, multiecho pulse sequences of  the brain and surrounding structures were obtained without intravenous contrast. COMPARISON:  CT head earlier today. FINDINGS: No evidence for acute infarction, hemorrhage, mass lesion, hydrocephalus, or extra-axial fluid. Generalized atrophy. Mild T2 and FLAIR hyperintensities, nonspecific, likely chronic microvascular ischemic change. 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 Prominent Perivascular spaces. Flow voids are maintained. No chronic hemorrhage. T1 weighted images do not show characteristic features of hepatic encephalopathy. No midline abnormality. Extracranial soft tissues unremarkable, except for a LEFT parietal vertex scalp hematoma. IMPRESSION: No intracranial posttraumatic sequelae are evident. There is advanced atrophy with chronic microvascular ischemic change. No specific features of hepatic encephalopathy are observed. Electronically Signed   By: Elsie Stain M.D.   On: 06/15/2015 15:56   Ct Abdomen Pelvis W Contrast  06/15/2015  CLINICAL DATA:  Found down with chest and abdominal bruising. Initial encounter. EXAM: CT CHEST, ABDOMEN, AND PELVIS WITH CONTRAST TECHNIQUE: Multidetector CT imaging of the chest, abdomen and pelvis was performed following the standard protocol during bolus administration of intravenous contrast. CONTRAST:  100 mL ISOVUE-300 IOPAMIDOL (ISOVUE-300) INJECTION 61% COMPARISON:  CT of the chest, abdomen and pelvis performed 04/05/2015 FINDINGS: CT CHEST Minimal bibasilar atelectasis is noted. The lungs are otherwise clear. No pleural effusion or pneumothorax is seen. No masses are identified. There is no evidence of pulmonary parenchymal contusion. Scattered coronary artery calcifications are seen. No pericardial effusion is identified. No mediastinal lymphadenopathy is seen. There is no evidence of venous hemorrhage. The great vessels are grossly unremarkable in appearance. Mild  calcification is seen along the aortic arch. The visualized portions of thyroid gland are unremarkable. No axillary lymphadenopathy is seen. There is no evidence of significant soft tissue injury along the chest wall. No acute osseous abnormalities are identified. Chronic right-sided rib deformities are noted. CT ABDOMEN AND PELVIS No free air or free fluid is seen within the abdomen or pelvis. There is no evidence of solid or hollow organ injury. There is a diffusely heterogeneous appearance to the liver, with multiple areas of decreased attenuation. Underlying mass cannot be excluded. The appearance raises question for hepatic cirrhosis and regenerative nodules. The gallbladder is within normal limits. The pancreas and adrenal glands are unremarkable. There is recanalization of the umbilical vein, extending to the vessels of the anterior abdominal wall. Scattered gastric and esophageal varices are seen. A tiny umbilical hernia is noted, containing only fat. The kidneys are unremarkable in appearance. There is no evidence of hydronephrosis. No renal or ureteral stones are seen. Mild nonspecific perinephric stranding is noted bilaterally. No free fluid is identified. The small bowel is unremarkable in appearance. The stomach is within normal limits. No acute vascular abnormalities are seen.  Mild scattered calcification is seen along the abdominal aorta and its branches. The appendix is not definitely characterized; there is no evidence of appendicitis. Scattered diverticulosis is noted along the descending and sigmoid colon, without evidence of diverticulitis. The bladder is decompressed, with a Foley catheter in place. The prostate is borderline prominent. No inguinal lymphadenopathy is seen. No acute osseous abnormalities are identified. 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 IMPRESSION: 1. No evidence of traumatic injury to the chest, abdomen or pelvis. 2. Changes of hepatic cirrhosis, with underlying  diffuse heterogeneity of the liver, concerning for degenerative nodules. Heterogeneity is more prominent than on the prior study. Underlying mass cannot be excluded. Dynamic liver protocol MRI or CT would be helpful for further evaluation. 3. Minimal bibasilar atelectasis noted.  Lungs otherwise clear. 4. Scattered coronary artery calcifications seen. 5. Recanalization of the umbilical vein, extending to the vessels of the anterior abdominal wall. Scattered gastric and esophageal varices noted. 6. Tiny umbilical hernia, containing only fat. 7. Mild scattered calcification along the abdominal aorta and its branches. 8. Scattered diverticulosis along the descending and sigmoid colon, without evidence of diverticulitis. 9. 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 Electronically Signed   By: Roanna Raider M.D.   On: 06/15/2015 02:47    2D ECHO:  LV EF: 40% - 45%  ------------------------------------------------------------------- Study Conclusions  - Left ventricle: The cavity size was normal. Systolic function was  mildly to moderately reduced. The estimated ejection fraction was  in the range of 40% to 45%. There is akinesis of the  entireinferolateral myocardium. Probable hypokinesis of the  entireinferior myocardium. - Right ventricle: Systolic function was moderately reduced.  Disposition and Follow-up: Discharge Instructions    Diet - low sodium heart healthy    Complete by:  As directed      Discharge instructions    Complete by:  As directed   Please titrate lactulose for 3-4 BM's in a day  Steroids have to be tapered by GI outpatient after 28days     Increase activity slowly    Complete by:  As directed             DISPOSITION: SNF   DISCHARGE FOLLOW-UP Follow-up Information    Follow up with [name], NP. Go on 06/28/2015.   Specialty:  Nurse Practitioner   Why:  for hospital follow-up// Appointment with Dr. Yetta Barre is on 06/28/15 at 1pm   Contact information:    [address] [place] [address] [phone]       Follow up with Stan Head, MD On 07/19/2015.   Specialty:  Gastroenterology   Why:  for hospital follow-up at 2:15PM    Contact information:   [address] [place] [address] [phone]        Time spent on Discharge:    Signed:   RAI,RIPUDEEP M.D. [facility] 06/20/2015, 11:04 AM Pager: [phone]

## 2015-06-20 NOTE — Telephone Encounter (Signed)
I put him in for 07/19/15 2:15 with Dr. Leone Payor No answer at phone number.  I mailed a letter to the patient

## 2015-06-20 NOTE — Progress Notes (Signed)
Report given to [facility] center RN.

## 2015-06-20 NOTE — Clinical Social Work Placement (Signed)
   CLINICAL SOCIAL WORK PLACEMENT  NOTE  Date:  06/20/2015  Patient Details  Name: Gary [name] MRN: [id number] Date of Birth: 1950/04/21  Clinical Social Work is seeking post-discharge placement for this patient at the Skilled  Nursing Facility level of care (*CSW will initial, date and re-position this form in  chart as items are completed):  Yes   Patient/family provided with [facility] Clinical Social Work Department's list of facilities offering this level of care within the geographic area requested by the patient (or if unable, by the patient's family).  Yes   Patient/family informed of their freedom to choose among providers that offer the needed level of care, that participate in Medicare, Medicaid or managed care program needed by the patient, have an available bed and are willing to accept the patient.  Yes   Patient/family informed of [facility]'s ownership interest in [facility] and [facility], as well as of the fact that they are under no obligation to receive care at these facilities.  PASRR submitted to EDS on       PASRR number received on       Existing PASRR number confirmed on 06/18/15     FL2 transmitted to all facilities in geographic area requested by pt/family on 06/18/15     FL2 transmitted to all facilities within larger geographic area on       Patient informed that his/her managed care company has contracts with or will negotiate with certain facilities, including the following:        Yes   Patient/family informed of bed offers received.  Patient chooses bed at [facility]     Physician recommends and patient chooses bed at      Patient to be transferred to [facility] on 06/20/15.  Patient to be transferred to facility by [facility]     Patient family notified on 06/20/15 of transfer.  Name of family member notified:  Sister     PHYSICIAN       Additional Comment:     _______________________________________________ Mearl Latin, LCSWA 06/20/2015, 11:11 AM

## 2015-06-20 NOTE — Progress Notes (Signed)
Patient will DC to: [facility] Anticipated DC date: 06/20/15 Family notified: Sister Transport by: [facility] 11:40am  CSW signing off.  Cristobal Goldmann, [place] Clinical Social Worker [phone]

## 2015-06-21 ENCOUNTER — Encounter: Payer: Self-pay | Admitting: Internal Medicine

## 2015-06-21 ENCOUNTER — Non-Acute Institutional Stay (SKILLED_NURSING_FACILITY): Payer: Medicare Other | Admitting: Internal Medicine

## 2015-06-21 DIAGNOSIS — F1010 Alcohol abuse, uncomplicated: Secondary | ICD-10-CM | POA: Diagnosis not present

## 2015-06-21 DIAGNOSIS — K219 Gastro-esophageal reflux disease without esophagitis: Secondary | ICD-10-CM

## 2015-06-21 DIAGNOSIS — G9340 Encephalopathy, unspecified: Secondary | ICD-10-CM

## 2015-06-21 DIAGNOSIS — K7011 Alcoholic hepatitis with ascites: Secondary | ICD-10-CM | POA: Diagnosis not present

## 2015-06-21 DIAGNOSIS — I10 Essential (primary) hypertension: Secondary | ICD-10-CM

## 2015-06-21 DIAGNOSIS — E559 Vitamin D deficiency, unspecified: Secondary | ICD-10-CM | POA: Diagnosis not present

## 2015-06-21 NOTE — Progress Notes (Signed)
MRN: [id number] Name: Gary [name]  Sex: male Age: 65 y.o. DOB: 1950-04-15  PSC #: Ronni Rumble Facility/Room:119 Level Of Care: SNF Provider: Merrilee Seashore D Emergency Contacts: Extended Emergency Contact Information Primary Emergency Contact: Christie Beckers [place] of [place] Home Phone: [phone] Mobile Phone: [phone] Relation: Sister  Code Status:   Allergies: Review of patient's allergies indicates no known allergies.  Chief Complaint  Patient presents with  . New Admit To SNF    HPI: Patient is 65 y.o. male who with a past medical history significant for alcoholic cirrhosis, HTN who presented after unwitnessed fall, found down at home. He was admitted to [facility] from 4/13-19 where he was treated for alcoholic cirrhosis with new ascites, hyperbilirubinemia of 13 and alcoholic encephalopathy. Course was complicated by electrolyte abnormalities and rhabdomyolysis. Pt is admitted to SNF with generalized weakness for OT/PT. While at SNF pt will be followed for HTN, tx with lisinopril, GERD, tx with protonix and Vit D def, tx with replacement.  Past Medical History  Diagnosis Date  . Hypertension   . Arthritis   . Alcohol abuse   . CHF (congestive heart failure) (HCC)   . Elevated liver enzymes   . Alcoholic cirrhosis of liver without ascites (HCC)   . Thrombocytopenia (HCC)   . Acute hepatitis   . Vitamin D deficiency   . Hypercholesterolemia   . Macrocytic anemia   . Tremors of nervous system     Past Surgical History  Procedure Laterality Date  . Tonsillectomy    . Wisdom tooth extraction        Medication List       This list is accurate as of: 06/21/15 11:59 PM.  Always use your most recent med list.               feeding supplement (ENSURE COMPLETE) Liqd  Take 237 mLs by mouth 2 (two) times daily between meals.     FISH OIL PO  Take 1 capsule by mouth daily.     folic acid 1 MG tablet  Commonly known as:  FOLVITE  Take 1 mg by mouth  daily.     hydroxypropyl methylcellulose / hypromellose 2.5 % ophthalmic solution  Commonly known as:  ISOPTO TEARS / GONIOVISC  Place 1 drop into both eyes 4 (four) times daily as needed for dry eyes.     lactulose 10 GM/15ML solution  Commonly known as:  CHRONULAC  Take 45 mLs (30 g total) by mouth 3 (three) times daily.     lisinopril 10 MG tablet  Commonly known as:  PRINIVIL,ZESTRIL  Take 1 tablet (10 mg total) by mouth daily.     multivitamin with minerals Tabs tablet  Take 1 tablet by mouth daily.     oxyCODONE 5 MG immediate release tablet  Commonly known as:  Oxy IR/ROXICODONE  Take 1 tablet (5 mg total) by mouth every 6 (six) hours as needed for severe pain.     pantoprazole 40 MG tablet  Commonly known as:  PROTONIX  Take 1 tablet (40 mg total) by mouth daily.     prednisoLONE 5 MG Tabs tablet  Take 8 tablets (40 mg total) by mouth daily. For 28 days then will be tapered by GI     propranolol 10 MG tablet  Commonly known as:  INDERAL  Take 1 tablet (10 mg total) by mouth 2 (two) times daily.     spironolactone 50 MG tablet  Commonly known as:  ALDACTONE  Take  1 tablet (50 mg total) by mouth daily.     thiamine 100 MG tablet  Take 1 tablet (100 mg total) by mouth daily.     Vitamin D (Ergocalciferol) 50000 units Caps capsule  Commonly known as:  DRISDOL  Take 50,000 Units by mouth every 7 (seven) days.        No orders of the defined types were placed in this encounter.    Immunization History  Administered Date(s) Administered  . Influenza,inj,Quad PF,36+ Mos 04/26/2014  . Pneumococcal Polysaccharide-23 04/26/2014    Social History  Substance Use Topics  . Smoking status: Never Smoker   . Smokeless tobacco: Never Used  . Alcohol Use: 4.8 oz/week    7 Cans of beer, 1 Shots of liquor per week     Comment: Pt stated does not drink now - 03/16/2014         04/05/2015  " i  still drink 2 beers every date  "    Family history is + COPD, CHF   Review  of Systems  DATA OBTAINED: from patient, nurse GENERAL:  no fevers, fatigue, appetite changes SKIN: No itching, rash or wounds EYES: No eye pain, redness, discharge EARS: No earache, tinnitus, change in hearing NOSE: No congestion, drainage or bleeding  MOUTH/THROAT: No mouth or tooth pain, No sore throat RESPIRATORY: No cough, wheezing, SOB CARDIAC: No chest pain, palpitations, lower extremity edema  GI: No abdominal pain, No N/V/D or constipation, No heartburn or reflux  GU: No dysuria, frequency or urgency, or incontinence  MUSCULOSKELETAL: No unrelieved bone/joint pain NEUROLOGIC: No headache, dizziness or focal weakness PSYCHIATRIC: No c/o anxiety or sadness   There were no vitals filed for this visit.  SpO2 Readings from Last 1 Encounters:  06/20/15 99%        Physical Exam  GENERAL APPEARANCE: Alert, min conversant,  No acute distress.  SKIN: No diaphoresis rash;mildly icteric HEAD: Normocephalic, atraumatic  EYES: Conjunctiva/lids clear. Pupils round, reactive. EOMs intact.  EARS: External exam WNL, canals clear. Hearing grossly normal.  NOSE: No deformity or discharge.  MOUTH/THROAT: Lips w/o lesions  RESPIRATORY: Breathing is even, unlabored. Lung sounds are clear   CARDIOVASCULAR: Heart RRR no murmurs, rubs or gallops. No peripheral edema.   GASTROINTESTINAL: Abdomen is soft, non-tender, mod ascites with fluid wave w/ normal bowel sounds. GENITOURINARY: Bladder non tender, not distended  MUSCULOSKELETAL: No abnormal joints or musculature NEUROLOGIC:  Cranial nerves 2-12 grossly intact. Moves all extremities  PSYCHIATRIC: Mood and affect appropriate to situation, no behavioral issues  Patient Active Problem List   Diagnosis Date Noted  . Vitamin D deficiency 06/28/2015  . Abnormal echocardiogram   . Alcoholic hepatitis with ascites   . Altered mental status 06/15/2015  . Hepatic cirrhosis (HCC) 06/15/2015  . Elevated troponin 06/15/2015  . Elevated CK  06/15/2015  . Fall 06/15/2015  . Maxillary sinus fracture (HCC) 04/05/2015  . Elevated LFTs 11/21/2014  . Hepatitis 11/21/2014  . Alcohol abuse 11/21/2014  . Hypokalemia   . Hypomagnesemia   . Acute encephalopathy   . Increased liver enzymes   . HTN (hypertension) 11/10/2014  . Polyarthritis of multiple sites (HCC)   . Esophageal reflux   . Alcoholic cirrhosis of liver without ascites (HCC)   . Shortness of breath 04/25/2014  . History of alcohol abuse 04/25/2014  . Lower extremity edema 04/25/2014  . Ascites 04/25/2014  . Rash 04/25/2014  . Left ankle pain 04/25/2014  . Hyponatremia   . Hyperbilirubinemia 03/01/2014  .  Anemia 03/01/2014  . Thrombocytopenia (HCC) 03/01/2014  . Alcohol dependence with withdrawal with complication (HCC) 02/28/2014  . Alcoholic hepatitis without ascites 02/25/2014  . Acute hepatitis 02/25/2014    CBC    Component Value Date/Time   WBC 7.9 06/19/2015 0539   RBC 3.75* 06/19/2015 0539   RBC 3.41* 06/15/2015 1704   HGB 13.4 06/19/2015 0539   HCT 37.5* 06/19/2015 0539   PLT 107* 06/19/2015 0539   MCV 100.0 06/19/2015 0539   LYMPHSABS 0.5* 06/14/2015 2237   MONOABS 1.2* 06/14/2015 2237   EOSABS 0.0 06/14/2015 2237   BASOSABS 0.0 06/14/2015 2237    CMP     Component Value Date/Time   NA 135 06/19/2015 0539   K 3.5 06/19/2015 0539   CL 103 06/19/2015 0539   CO2 23 06/19/2015 0539   GLUCOSE 107* 06/19/2015 0539   BUN 8 06/19/2015 0539   CREATININE 0.80 06/19/2015 0539   CALCIUM 8.3* 06/19/2015 0539   PROT 5.3* 06/19/2015 0539   ALBUMIN 2.1* 06/19/2015 0539   AST 158* 06/19/2015 0539   ALT 89* 06/19/2015 0539   ALKPHOS 154* 06/19/2015 0539   BILITOT 10.4* 06/19/2015 0539   GFRNONAA >60 06/19/2015 0539   GFRAA >60 06/19/2015 0539    Lab Results  Component Value Date   HGBA1C 4.9 04/25/2014     Dg Chest 1 View  06/15/2015  CLINICAL DATA:  Found down at home. EXAM: CHEST 1 VIEW COMPARISON:  Chest radiograph April 05, 2015  FINDINGS: Cardiac silhouette is mildly enlarged, mediastinal silhouette is unremarkable for this low inspiratory portable examination with crowded vasculature markings. The lungs are clear without pleural effusions or focal consolidations. Bandlike density LEFT lung base. Stable LEFT apical pleural thickening. Trachea projects midline and there is no pneumothorax. Included soft tissue planes and osseous structures are non-suspicious. Old RIGHT posterior rib fractures. Old RIGHT mid clavicle fracture. Osteopenia. IMPRESSION: Mild cardiomegaly and LEFT lung base atelectasis. Electronically Signed   By: Awilda Metro M.D.   On: 06/15/2015 01:40   Dg Elbow Complete Left  06/15/2015  CLINICAL DATA:  Larey Seat at home. EXAM: LEFT ELBOW - COMPLETE 3+ VIEW COMPARISON:  None. FINDINGS: There is no evidence of fracture, dislocation, or joint effusion. There is no evidence of arthropathy or other focal bone abnormality. Soft tissues are unremarkable. IMPRESSION: Negative. Electronically Signed   By: Ellery Plunk M.D.   On: 06/15/2015 01:42   Dg Elbow Complete Right  06/15/2015  CLINICAL DATA:  Larey Seat at home EXAM: RIGHT ELBOW - COMPLETE 3+ VIEW COMPARISON:  None. FINDINGS: There is no evidence of fracture, dislocation, or joint effusion. There is no evidence of arthropathy or other focal bone abnormality. Soft tissues are unremarkable. IMPRESSION: Negative. Electronically Signed   By: Ellery Plunk M.D.   On: 06/15/2015 01:42   Ct Head Wo Contrast  06/15/2015  CLINICAL DATA:  Found down, with bruising about the head. Concern for head or cervical spine injury. Initial encounter. EXAM: CT HEAD WITHOUT CONTRAST CT CERVICAL SPINE WITHOUT CONTRAST TECHNIQUE: Multidetector CT imaging of the head and cervical spine was performed following the standard protocol without intravenous contrast. Multiplanar CT image reconstructions of the cervical spine were also generated. COMPARISON:  None. FINDINGS: CT HEAD FINDINGS There  is no evidence of acute infarction, mass lesion, or intra- or extra-axial hemorrhage on CT. Prominence of the ventricles and sulci reflects mild cortical volume loss. Mild cerebellar atrophy is noted. Mild periventricular and subcortical white matter change likely reflects small vessel ischemic  microangiopathy. The brainstem and fourth ventricle are within normal limits. The basal ganglia are unremarkable in appearance. The cerebral hemispheres demonstrate grossly normal gray-white differentiation. No mass effect or midline shift is seen. There is no evidence of fracture; visualized osseous structures are unremarkable in appearance. The visualized portions of the orbits are within normal limits. Mucus retention cyst or polyp is noted at the left maxillary sinus. The remaining paranasal sinuses and mastoid air cells are well-aerated. Soft tissue swelling is noted overlying the left parietal calvarium and at the left vertex. CT CERVICAL SPINE FINDINGS There is no evidence of fracture or subluxation. Vertebral bodies demonstrate normal height and alignment. Multilevel disc space narrowing is noted along the lower cervical spine, with small anterior and posterior disc osteophyte complexes. Prevertebral soft tissues are within normal limits. The visualized neural foramina are grossly unremarkable. The thyroid gland is unremarkable in appearance. No significant soft tissue abnormalities are seen. IMPRESSION: 1. No evidence of traumatic intracranial injury or fracture. 2. No evidence of fracture or subluxation along the cervical spine. 3. Soft tissue swelling overlying the left parietal calvarium and at the left vertex. 4. Mild cortical volume loss and scattered small vessel ischemic microangiopathy. 5. Mild degenerative change along the lower cervical spine. 6. Mucus retention cyst or polyp at the left maxillary sinus. Electronically Signed   By: Roanna Raider M.D.   On: 06/15/2015 02:37   Ct Chest W  Contrast  06/15/2015  CLINICAL DATA:  Found down with chest and abdominal bruising. Initial encounter. EXAM: CT CHEST, ABDOMEN, AND PELVIS WITH CONTRAST TECHNIQUE: Multidetector CT imaging of the chest, abdomen and pelvis was performed following the standard protocol during bolus administration of intravenous contrast. CONTRAST:  100 mL ISOVUE-300 IOPAMIDOL (ISOVUE-300) INJECTION 61% COMPARISON:  CT of the chest, abdomen and pelvis performed 04/05/2015 FINDINGS: CT CHEST Minimal bibasilar atelectasis is noted. The lungs are otherwise clear. No pleural effusion or pneumothorax is seen. No masses are identified. There is no evidence of pulmonary parenchymal contusion. Scattered coronary artery calcifications are seen. No pericardial effusion is identified. No mediastinal lymphadenopathy is seen. There is no evidence of venous hemorrhage. The great vessels are grossly unremarkable in appearance. Mild calcification is seen along the aortic arch. The visualized portions of thyroid gland are unremarkable. No axillary lymphadenopathy is seen. There is no evidence of significant soft tissue injury along the chest wall. No acute osseous abnormalities are identified. Chronic right-sided rib deformities are noted. CT ABDOMEN AND PELVIS No free air or free fluid is seen within the abdomen or pelvis. There is no evidence of solid or hollow organ injury. There is a diffusely heterogeneous appearance to the liver, with multiple areas of decreased attenuation. Underlying mass cannot be excluded. The appearance raises question for hepatic cirrhosis and regenerative nodules. The gallbladder is within normal limits. The pancreas and adrenal glands are unremarkable. There is recanalization of the umbilical vein, extending to the vessels of the anterior abdominal wall. Scattered gastric and esophageal varices are seen. A tiny umbilical hernia is noted, containing only fat. The kidneys are unremarkable in appearance. There is no  evidence of hydronephrosis. No renal or ureteral stones are seen. Mild nonspecific perinephric stranding is noted bilaterally. No free fluid is identified. The small bowel is unremarkable in appearance. The stomach is within normal limits. No acute vascular abnormalities are seen. Mild scattered calcification is seen along the abdominal aorta and its branches. The appendix is not definitely characterized; there is no evidence of appendicitis. Scattered diverticulosis is  noted along the descending and sigmoid colon, without evidence of diverticulitis. The bladder is decompressed, with a Foley catheter in place. The prostate is borderline prominent. No inguinal lymphadenopathy is seen. No acute osseous abnormalities are identified. 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 IMPRESSION: 1. No evidence of traumatic injury to the chest, abdomen or pelvis. 2. Changes of hepatic cirrhosis, with underlying diffuse heterogeneity of the liver, concerning for degenerative nodules. Heterogeneity is more prominent than on the prior study. Underlying mass cannot be excluded. Dynamic liver protocol MRI or CT would be helpful for further evaluation. 3. Minimal bibasilar atelectasis noted.  Lungs otherwise clear. 4. Scattered coronary artery calcifications seen. 5. Recanalization of the umbilical vein, extending to the vessels of the anterior abdominal wall. Scattered gastric and esophageal varices noted. 6. Tiny umbilical hernia, containing only fat. 7. Mild scattered calcification along the abdominal aorta and its branches. 8. Scattered diverticulosis along the descending and sigmoid colon, without evidence of diverticulitis. 9. 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 Electronically Signed   By: Roanna Raider M.D.   On: 06/15/2015 02:47   Ct Cervical Spine Wo Contrast  06/15/2015  CLINICAL DATA:  Found down, with bruising about the head. Concern for head or cervical spine injury. Initial encounter. EXAM: CT HEAD WITHOUT  CONTRAST CT CERVICAL SPINE WITHOUT CONTRAST TECHNIQUE: Multidetector CT imaging of the head and cervical spine was performed following the standard protocol without intravenous contrast. Multiplanar CT image reconstructions of the cervical spine were also generated. COMPARISON:  None. FINDINGS: CT HEAD FINDINGS There is no evidence of acute infarction, mass lesion, or intra- or extra-axial hemorrhage on CT. Prominence of the ventricles and sulci reflects mild cortical volume loss. Mild cerebellar atrophy is noted. Mild periventricular and subcortical white matter change likely reflects small vessel ischemic microangiopathy. The brainstem and fourth ventricle are within normal limits. The basal ganglia are unremarkable in appearance. The cerebral hemispheres demonstrate grossly normal gray-white differentiation. No mass effect or midline shift is seen. There is no evidence of fracture; visualized osseous structures are unremarkable in appearance. The visualized portions of the orbits are within normal limits. Mucus retention cyst or polyp is noted at the left maxillary sinus. The remaining paranasal sinuses and mastoid air cells are well-aerated. Soft tissue swelling is noted overlying the left parietal calvarium and at the left vertex. CT CERVICAL SPINE FINDINGS There is no evidence of fracture or subluxation. Vertebral bodies demonstrate normal height and alignment. Multilevel disc space narrowing is noted along the lower cervical spine, with small anterior and posterior disc osteophyte complexes. Prevertebral soft tissues are within normal limits. The visualized neural foramina are grossly unremarkable. The thyroid gland is unremarkable in appearance. No significant soft tissue abnormalities are seen. IMPRESSION: 1. No evidence of traumatic intracranial injury or fracture. 2. No evidence of fracture or subluxation along the cervical spine. 3. Soft tissue swelling overlying the left parietal calvarium and at the  left vertex. 4. Mild cortical volume loss and scattered small vessel ischemic microangiopathy. 5. Mild degenerative change along the lower cervical spine. 6. Mucus retention cyst or polyp at the left maxillary sinus. Electronically Signed   By: Roanna Raider M.D.   On: 06/15/2015 02:37   Mr Brain Wo Contrast  06/15/2015  CLINICAL DATA:  Chief complaint. Fall with confusion. Alcoholic cirrhosis. Encephalopathy. EXAM: MRI HEAD WITHOUT CONTRAST TECHNIQUE: Multiplanar, multiecho pulse sequences of the brain and surrounding structures were obtained without intravenous contrast. COMPARISON:  CT head earlier today. FINDINGS: No evidence  for acute infarction, hemorrhage, mass lesion, hydrocephalus, or extra-axial fluid. Generalized atrophy. Mild T2 and FLAIR hyperintensities, nonspecific, likely chronic microvascular ischemic change. 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 Prominent Perivascular spaces. Flow voids are maintained. No chronic hemorrhage. T1 weighted images do not show characteristic features of hepatic encephalopathy. No midline abnormality. Extracranial soft tissues unremarkable, except for a LEFT parietal vertex scalp hematoma. IMPRESSION: No intracranial posttraumatic sequelae are evident. There is advanced atrophy with chronic microvascular ischemic change. No specific features of hepatic encephalopathy are observed. Electronically Signed   By: Elsie Stain M.D.   On: 06/15/2015 15:56   Ct Abdomen Pelvis W Contrast  06/15/2015  CLINICAL DATA:  Found down with chest and abdominal bruising. Initial encounter. EXAM: CT CHEST, ABDOMEN, AND PELVIS WITH CONTRAST TECHNIQUE: Multidetector CT imaging of the chest, abdomen and pelvis was performed following the standard protocol during bolus administration of intravenous contrast. CONTRAST:  100 mL ISOVUE-300 IOPAMIDOL (ISOVUE-300) INJECTION 61% COMPARISON:  CT of the  chest, abdomen and pelvis performed 04/05/2015 FINDINGS: CT CHEST Minimal bibasilar atelectasis is noted. The lungs are otherwise clear. No pleural effusion or pneumothorax is seen. No masses are identified. There is no evidence of pulmonary parenchymal contusion. Scattered coronary artery calcifications are seen. No pericardial effusion is identified. No mediastinal lymphadenopathy is seen. There is no evidence of venous hemorrhage. The great vessels are grossly unremarkable in appearance. Mild calcification is seen along the aortic arch. The visualized portions of thyroid gland are unremarkable. No axillary lymphadenopathy is seen. There is no evidence of significant soft tissue injury along the chest wall. No acute osseous abnormalities are identified. Chronic right-sided rib deformities are noted. CT ABDOMEN AND PELVIS No free air or free fluid is seen within the abdomen or pelvis. There is no evidence of solid or hollow organ injury. There is a diffusely heterogeneous appearance to the liver, with multiple areas of decreased attenuation. Underlying mass cannot be excluded. The appearance raises question for hepatic cirrhosis and regenerative nodules. The gallbladder is within normal limits. The pancreas and adrenal glands are unremarkable. There is recanalization of the umbilical vein, extending to the vessels of the anterior abdominal wall. Scattered gastric and esophageal varices are seen. A tiny umbilical hernia is noted, containing only fat. The kidneys are unremarkable in appearance. There is no evidence of hydronephrosis. No renal or ureteral stones are seen. Mild nonspecific perinephric stranding is noted bilaterally. No free fluid is identified. The small bowel is unremarkable in appearance. The stomach is within normal limits. No acute vascular abnormalities are seen. Mild scattered calcification is seen along the abdominal aorta and its branches. The appendix is not definitely characterized; there is  no evidence of appendicitis. Scattered diverticulosis is noted along the descending and sigmoid colon, without evidence of diverticulitis. The bladder is decompressed, with a Foley catheter in place. The prostate is borderline prominent. No inguinal lymphadenopathy is seen. No acute osseous abnormalities are identified. 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 IMPRESSION: 1. No evidence of traumatic injury to the chest, abdomen or pelvis. 2. Changes of hepatic cirrhosis, with underlying diffuse heterogeneity of the liver, concerning for degenerative nodules. Heterogeneity is more prominent than on the prior study. Underlying mass cannot be excluded. Dynamic liver protocol MRI or CT would be helpful for further evaluation. 3. Minimal bibasilar atelectasis noted.  Lungs otherwise clear. 4. Scattered coronary artery calcifications seen. 5. Recanalization of the umbilical vein, extending  to the vessels of the anterior abdominal wall. Scattered gastric and esophageal varices noted. 6. Tiny umbilical hernia, containing only fat. 7. Mild scattered calcification along the abdominal aorta and its branches. 8. Scattered diverticulosis along the descending and sigmoid colon, without evidence of diverticulitis. 9. 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 Electronically Signed   By: Roanna Raider M.D.   On: 06/15/2015 02:47    Not all labs, radiology exams or other studies done during hospitalization come through on my EPIC note; however they are reviewed by me.    Assessment and Plan  Acute encephalopathy Significantly better, appears to be at his baseline now - Multifactorial, possibility of hepatic encephalopathy with cirrhosis, multiple metabolic abnormalities including hyponatremia, hypokalemia, rhabdomyolysis, EtOH withdrawal - No focal neurological deficits noted, CT head negative, MRI brain negative, EEG consistent with a mild generalized nonspecific cerebral dysfunction/encephalopathy. No seizure  activity noted. - Ammonia level was elevated at 92, patient was placed on oral lactulose, titrate for 3 to 4 BM/24hrs - TSH normal - Initially started on IV Rocephin, however urine culture was negative and hence antibiotics discontinued - B12 folate and normal limits - Diet advanced to solid , patient tolerating SNF - cont lactulose, monitor MS  Hyperbilirubinemia Likely due to liver cirrhosis, ascites has macrocytic anemia, thrombocytopenia, hepatic encephalopathy/hyperammonemia - CT abdomen showed hepatic cirrhosis with degenerative nodules - Ultrasound showed severe hepatic steatosis, trace amount of perisplenic ascites - on steroids for acute alcoholic hepatitis, patient has GI appointment for further steroid taper outpatient , continue current dose for 28 days. - AFP 2.4 - Placed on propranolol, Aldactone 50 mg daily. Follow potassium level. SNF - cont propranolol and aldactone; f/u CMP for K and bilirubin  HTN (hypertension) SNF - controlled on lisinopril ( and aldactone 50 mg daily, inderal 10 mg BID for ascites)  Alcohol abuse SNF - Continued CIWA protocol with ativan, patient currently stable   Alcoholic hepatitis with ascites SNF - pt started on aldactone and propranolol; cont same  Esophageal reflux SNF - not stated as uncontrolled; Cont protonix 40 mg daily  Vitamin D deficiency SNF - cont 50,000u weekly   Time spent > 45 min;> 50% of time with patient was spent reviewing records, labs, tests and studies, counseling and developing plan of care   Margit Hanks, MD

## 2015-06-28 ENCOUNTER — Encounter: Payer: Self-pay | Admitting: Internal Medicine

## 2015-06-28 DIAGNOSIS — E559 Vitamin D deficiency, unspecified: Secondary | ICD-10-CM | POA: Insufficient documentation

## 2015-06-28 NOTE — Assessment & Plan Note (Signed)
SNF - Continued CIWA protocol with ativan, patient currently stable

## 2015-06-28 NOTE — Assessment & Plan Note (Signed)
SNF - cont 50,000u weekly

## 2015-06-28 NOTE — Assessment & Plan Note (Signed)
SNF - not stated as uncontrolled; Cont protonix 40 mg daily

## 2015-06-28 NOTE — Assessment & Plan Note (Signed)
Significantly better, appears to be at his baseline now - Multifactorial, possibility of hepatic encephalopathy with cirrhosis, multiple metabolic abnormalities including hyponatremia, hypokalemia, rhabdomyolysis, EtOH withdrawal - No focal neurological deficits noted, CT head negative, MRI brain negative, EEG consistent with a mild generalized nonspecific cerebral dysfunction/encephalopathy. No seizure activity noted. - Ammonia level was elevated at 92, patient was placed on oral lactulose, titrate for 3 to 4 BM/24hrs - TSH normal - Initially started on IV Rocephin, however urine culture was negative and hence antibiotics discontinued - B12 folate and normal limits - Diet advanced to solid , patient tolerating SNF - cont lactulose, monitor MS

## 2015-06-28 NOTE — Assessment & Plan Note (Addendum)
SNF - controlled on lisinopril ( and aldactone 50 mg daily, inderal 10 mg BID for ascites)

## 2015-06-28 NOTE — Assessment & Plan Note (Signed)
SNF - pt started on aldactone and propranolol; cont same

## 2015-06-28 NOTE — Assessment & Plan Note (Signed)
Likely due to liver cirrhosis, ascites has macrocytic anemia, thrombocytopenia, hepatic encephalopathy/hyperammonemia - CT abdomen showed hepatic cirrhosis with degenerative nodules - Ultrasound showed severe hepatic steatosis, trace amount of perisplenic ascites - on steroids for acute alcoholic hepatitis, patient has GI appointment for further steroid taper outpatient , continue current dose for 28 days. - AFP 2.4 - Placed on propranolol, Aldactone 50 mg daily. Follow potassium level. SNF - cont propranolol and aldactone; f/u CMP for K and bilirubin

## 2015-07-05 ENCOUNTER — Non-Acute Institutional Stay (SKILLED_NURSING_FACILITY): Payer: Medicare Other | Admitting: Internal Medicine

## 2015-07-05 ENCOUNTER — Encounter: Payer: Self-pay | Admitting: Internal Medicine

## 2015-07-05 DIAGNOSIS — R600 Localized edema: Secondary | ICD-10-CM

## 2015-07-05 NOTE — Progress Notes (Signed)
MRN: [id number] Name: Gary [name]  Sex: male Age: 65 y.o. DOB: August 30, 1950  PSC #: Ronni Rumble Facility/Room:119-A Level Of Care: SNF Provider: Merrilee Seashore MD  Emergency Contacts: Extended Emergency Contact Information Primary Emergency Contact: Christie Beckers [place] Home Phone: [phone] Mobile Phone: [phone] Relation: Sister  Code Status:   Allergies: Review of patient's allergies indicates no known allergies.  Chief Complaint  Patient presents with  . Acute Visit    HPI: Patient is 65 y.o. male who nursing asked e to see for swelling BLE. Noted for past week.pt decscribes they are so tight he has trouble moving them.Pt denies CP or SOB. Hx alcoholic liver dx with ascites, very small amt.  Past Medical History  Diagnosis Date  . Hypertension   . Arthritis   . Alcohol abuse   . CHF (congestive heart failure) (HCC)   . Elevated liver enzymes   . Alcoholic cirrhosis of liver without ascites (HCC)   . Thrombocytopenia (HCC)   . Acute hepatitis   . Vitamin D deficiency   . Hypercholesterolemia   . Macrocytic anemia   . Tremors of nervous system     Past Surgical History  Procedure Laterality Date  . Tonsillectomy    . Wisdom tooth extraction        Medication List       This list is accurate as of: 07/05/15  6:00 PM.  Always use your most recent med list.               feeding supplement (ENSURE COMPLETE) Liqd  Take 237 mLs by mouth 2 (two) times daily between meals.     FISH OIL PO  Take 1 capsule by mouth daily.     folic acid 1 MG tablet  Commonly known as:  FOLVITE  Take 1 mg by mouth daily.     furosemide 20 MG tablet  Commonly known as:  LASIX  Take 60 mg by mouth daily.     hydroxypropyl methylcellulose / hypromellose 2.5 % ophthalmic solution  Commonly known as:  ISOPTO TEARS / GONIOVISC  Place 1 drop into both eyes 4 (four) times daily as needed for dry eyes.     lactulose 10 GM/15ML solution  Commonly known  as:  CHRONULAC  Take 45 mLs (30 g total) by mouth 3 (three) times daily.     lisinopril 10 MG tablet  Commonly known as:  PRINIVIL,ZESTRIL  Take 1 tablet (10 mg total) by mouth daily.     multivitamin with minerals Tabs tablet  Take 1 tablet by mouth daily.     oxyCODONE 5 MG immediate release tablet  Commonly known as:  Oxy IR/ROXICODONE  Take 1 tablet (5 mg total) by mouth every 6 (six) hours as needed for severe pain.     pantoprazole 40 MG tablet  Commonly known as:  PROTONIX  Take 1 tablet (40 mg total) by mouth daily.     predniSONE 20 MG tablet  Commonly known as:  DELTASONE  Take 40 mg by mouth daily with breakfast.     propranolol 10 MG tablet  Commonly known as:  INDERAL  Take 1 tablet (10 mg total) by mouth 2 (two) times daily.     spironolactone 50 MG tablet  Commonly known as:  ALDACTONE  Take 1 tablet (50 mg total) by mouth daily.     thiamine 100 MG tablet  Take 1 tablet (100 mg total) by mouth daily.     Vitamin D (  Ergocalciferol) 50000 units Caps capsule  Commonly known as:  DRISDOL  Take 50,000 Units by mouth every 7 (seven) days.        Meds ordered this encounter  Medications  . furosemide (LASIX) 20 MG tablet    Sig: Take 60 mg by mouth daily.  . predniSONE (DELTASONE) 20 MG tablet    Sig: Take 40 mg by mouth daily with breakfast.    Immunization History  Administered Date(s) Administered  . Influenza,inj,Quad PF,36+ Mos 04/26/2014  . Pneumococcal Polysaccharide-23 04/26/2014    Social History  Substance Use Topics  . Smoking status: Never Smoker   . Smokeless tobacco: Never Used  . Alcohol Use: 4.8 oz/week    7 Cans of beer, 1 Shots of liquor per week     Comment: Pt stated does not drink now - 03/16/2014         04/05/2015  " i  still drink 2 beers every date  "    Review of Systems  DATA OBTAINED: from patient, nurse GENERAL:  no fevers, fatigue, appetite changes SKIN: No itching, rash HEENT: No complaint RESPIRATORY: No cough,  wheezing, SOB CARDIAC: No chest pain, palpitations,+ lower extremity edema  GI: No abdominal pain, No N/V/D or constipation, No heartburn or reflux  GU: No dysuria, frequency or urgency, or incontinence  MUSCULOSKELETAL: No unrelieved bone/joint pain NEUROLOGIC: No headache, dizziness  PSYCHIATRIC: No overt anxiety or sadness  Filed Vitals:   07/05/15 1455  BP: 107/72  Pulse: 74  Temp: 98.6 F (37 C)  Resp: 20    Physical Exam  GENERAL APPEARANCE: Alert, conversant, No acute distress  SKIN: No diaphoresis rash, minimal icterus HEENT: Unremarkable RESPIRATORY: Breathing is even, unlabored. Lung sounds are clear   CARDIOVASCULAR: Heart RRR no murmurs, rubs or gallops. 2+ peripheral edema up o thighs GASTROINTESTINAL: Abdomen is soft, non-tender, min  distended w/ normal bowel sounds.  GENITOURINARY: Bladder non tender, not distended  MUSCULOSKELETAL: No abnormal joints or musculature NEUROLOGIC: Cranial nerves 2-12 grossly intact. Moves all extremities PSYCHIATRIC: Mood and affect appropriate to situation, no behavioral issues  Patient Active Problem List   Diagnosis Date Noted  . Vitamin D deficiency 06/28/2015  . Abnormal echocardiogram   . Alcoholic hepatitis with ascites   . Altered mental status 06/15/2015  . Hepatic cirrhosis (HCC) 06/15/2015  . Elevated troponin 06/15/2015  . Elevated CK 06/15/2015  . Fall 06/15/2015  . Maxillary sinus fracture (HCC) 04/05/2015  . Elevated LFTs 11/21/2014  . Hepatitis 11/21/2014  . Alcohol abuse 11/21/2014  . Hypokalemia   . Hypomagnesemia   . Acute encephalopathy   . Increased liver enzymes   . HTN (hypertension) 11/10/2014  . Polyarthritis of multiple sites (HCC)   . Esophageal reflux   . Alcoholic cirrhosis of liver without ascites (HCC)   . Shortness of breath 04/25/2014  . History of alcohol abuse 04/25/2014  . Lower extremity edema 04/25/2014  . Ascites 04/25/2014  . Rash 04/25/2014  . Left ankle pain 04/25/2014  .  Hyponatremia   . Hyperbilirubinemia 03/01/2014  . Anemia 03/01/2014  . Thrombocytopenia (HCC) 03/01/2014  . Alcohol dependence with withdrawal with complication (HCC) 02/28/2014  . Alcoholic hepatitis without ascites 02/25/2014  . Acute hepatitis 02/25/2014    CBC    Component Value Date/Time   WBC 7.9 06/19/2015 0539   RBC 3.75* 06/19/2015 0539   RBC 3.41* 06/15/2015 1704   HGB 13.4 06/19/2015 0539   HCT 37.5* 06/19/2015 0539   PLT 107* 06/19/2015 0539  MCV 100.0 06/19/2015 0539   LYMPHSABS 0.5* 06/14/2015 2237   MONOABS 1.2* 06/14/2015 2237   EOSABS 0.0 06/14/2015 2237   BASOSABS 0.0 06/14/2015 2237    CMP     Component Value Date/Time   NA 135 06/19/2015 0539   K 3.5 06/19/2015 0539   CL 103 06/19/2015 0539   CO2 23 06/19/2015 0539   GLUCOSE 107* 06/19/2015 0539   BUN 8 06/19/2015 0539   CREATININE 0.80 06/19/2015 0539   CALCIUM 8.3* 06/19/2015 0539   PROT 5.3* 06/19/2015 0539   ALBUMIN 2.1* 06/19/2015 0539   AST 158* 06/19/2015 0539   ALT 89* 06/19/2015 0539   ALKPHOS 154* 06/19/2015 0539   BILITOT 10.4* 06/19/2015 0539   GFRNONAA >60 06/19/2015 0539   GFRAA >60 06/19/2015 0539    Assessment and Plan  BLE edema - pt was started on aldactone a little over 2 weeks ago during last hospital admission.On SNF admission pt with no edema, now 2+. Fortunately no significant ascites. Have written for lasix 40 mg daily with a BMP in a week. Pt is on lisinopril and aldactone. Depending on BUN/CR and pedal edema will leave pt at 40 mg or back off to 20 mg lasix dailyI believe he is going to need to be on lasix chronically.  Time spent > 25 ;> 50% of time with patient was spent reviewing records, labs, tests and studies, counseling and developing plan of care  Merrilee Seashore  MD

## 2015-07-09 ENCOUNTER — Non-Acute Institutional Stay (SKILLED_NURSING_FACILITY): Payer: Medicare Other | Admitting: Internal Medicine

## 2015-07-09 ENCOUNTER — Encounter: Payer: Self-pay | Admitting: Internal Medicine

## 2015-07-09 DIAGNOSIS — K7011 Alcoholic hepatitis with ascites: Secondary | ICD-10-CM | POA: Diagnosis not present

## 2015-07-09 DIAGNOSIS — M064 Inflammatory polyarthropathy: Secondary | ICD-10-CM | POA: Diagnosis not present

## 2015-07-09 DIAGNOSIS — R931 Abnormal findings on diagnostic imaging of heart and coronary circulation: Secondary | ICD-10-CM | POA: Diagnosis not present

## 2015-07-09 DIAGNOSIS — F1010 Alcohol abuse, uncomplicated: Secondary | ICD-10-CM | POA: Diagnosis not present

## 2015-07-09 DIAGNOSIS — E559 Vitamin D deficiency, unspecified: Secondary | ICD-10-CM

## 2015-07-09 DIAGNOSIS — K219 Gastro-esophageal reflux disease without esophagitis: Secondary | ICD-10-CM

## 2015-07-09 DIAGNOSIS — G9340 Encephalopathy, unspecified: Secondary | ICD-10-CM | POA: Diagnosis not present

## 2015-07-09 DIAGNOSIS — F1011 Alcohol abuse, in remission: Secondary | ICD-10-CM

## 2015-07-09 DIAGNOSIS — I10 Essential (primary) hypertension: Secondary | ICD-10-CM | POA: Diagnosis not present

## 2015-07-09 DIAGNOSIS — M130 Polyarthritis, unspecified: Secondary | ICD-10-CM

## 2015-07-09 NOTE — Progress Notes (Signed)
MRN: [id number] Name: Gary [name]  Sex: male Age: 65 y.o. DOB: [birth year]  PSC #: Ronni Rumble Facility/Room:119A Level Of Care: SNF Provider: Merrilee Seashore D Emergency Contacts: Extended Emergency Contact Information Primary Emergency Contact: Christie Beckers [place] Home Phone: [phone] Mobile Phone: [phone] Relation: Sister  Code Status:   Allergies: Review of patient's allergies indicates no known allergies.  Chief Complaint  Patient presents with  . Discharge Note    HPI: Patient is 65 y.o. male with past medical history significant for alcoholic cirrhosis, HTN who presented after unwitnessed fall, found down at home. He was admitted to [facility] from 4/13-19 where he was treated for alcoholic cirrhosis with new ascites, hyperbilirubinemia of 13 and alcoholic encephalopathy. Course was complicated by electrolyte abnormalities and rhabdomyolysis. Pt is admitted to SNF with generalized weakness for OT/PT. Pt developed significant LE edema at SNF and was started on Lasix. Pt is ready to be d/c to home.  Past Medical History  Diagnosis Date  . Hypertension   . Arthritis   . Alcohol abuse   . CHF (congestive heart failure) (HCC)   . Elevated liver enzymes   . Alcoholic cirrhosis of liver without ascites (HCC)   . Thrombocytopenia (HCC)   . Acute hepatitis   . Vitamin D deficiency   . Hypercholesterolemia   . Macrocytic anemia   . Tremors of nervous system     Past Surgical History  Procedure Laterality Date  . Tonsillectomy    . Wisdom tooth extraction        Medication List       This list is accurate as of: 07/09/15  6:06 PM.  Always use your most recent med list.               CALAZIME SKIN PROTECTANT Pste  Apply topically 3 (three) times daily. Reported on 07/09/2015     FISH OIL PO  Take 1 capsule by mouth daily.     folic acid 1 MG tablet  Commonly known as:  FOLVITE  Take 1 mg by mouth daily.     furosemide 20 MG tablet   Commonly known as:  LASIX  Take 40 mg by mouth daily.     hydroxypropyl methylcellulose / hypromellose 2.5 % ophthalmic solution  Commonly known as:  ISOPTO TEARS / GONIOVISC  Place 1 drop into both eyes 4 (four) times daily as needed for dry eyes.     lactulose 10 GM/15ML solution  Commonly known as:  CHRONULAC  Take 45 mLs (30 g total) by mouth 3 (three) times daily.     lisinopril 10 MG tablet  Commonly known as:  PRINIVIL,ZESTRIL  Take 1 tablet (10 mg total) by mouth daily.     multivitamin with minerals Tabs tablet  Take 1 tablet by mouth daily.     oxyCODONE 5 MG immediate release tablet  Commonly known as:  Oxy IR/ROXICODONE  Take 1 tablet (5 mg total) by mouth every 6 (six) hours as needed for severe pain.     pantoprazole 40 MG tablet  Commonly known as:  PROTONIX  Take 1 tablet (40 mg total) by mouth daily.     predniSONE 20 MG tablet  Commonly known as:  DELTASONE  Take 40 mg by mouth daily with breakfast.     propranolol 10 MG tablet  Commonly known as:  INDERAL  Take 1 tablet (10 mg total) by mouth 2 (two) times daily.     spironolactone 50 MG tablet  Commonly known as:  ALDACTONE  Take 1 tablet (50 mg total) by mouth daily.     thiamine 100 MG tablet  Take 1 tablet (100 mg total) by mouth daily.     Vitamin D (Ergocalciferol) 50000 units Caps capsule  Commonly known as:  DRISDOL  Take 50,000 Units by mouth every 7 (seven) days.        Meds ordered this encounter  Medications  . Skin Protectants, Misc. (CALAZIME SKIN PROTECTANT) PSTE    Sig: Apply topically 3 (three) times daily. Reported on 07/09/2015    Immunization History  Administered Date(s) Administered  . Influenza,inj,Quad PF,36+ Mos 04/26/2014  . Pneumococcal Polysaccharide-23 04/26/2014    Social History  Substance Use Topics  . Smoking status: Never Smoker   . Smokeless tobacco: Never Used  . Alcohol Use: 4.8 oz/week    7 Cans of beer, 1 Shots of liquor per week     Comment:  Pt stated does not drink now - 03/16/2014         04/05/2015  " i  still drink 2 beers every date  "    Filed Vitals:   07/09/15 1153  BP: 120/72  Pulse: 74  Temp: 98.6 F (37 C)  Resp: 20    Physical Exam  GENERAL APPEARANCE: Alert, conversant. No acute distress.  HEENT: Unremarkable. RESPIRATORY: Breathing is even, unlabored. Lung sounds are clear   CARDIOVASCULAR: Heart RRR no murmurs, rubs or gallops. + peripheral edema.  GASTROINTESTINAL: Abdomen is soft, non-tender, not distended w/ normal bowel sounds.  NEUROLOGIC: Cranial nerves 2-12 grossly intact. Moves all extremities  Patient Active Problem List   Diagnosis Date Noted  . Vitamin D deficiency 06/28/2015  . Abnormal echocardiogram   . Alcoholic hepatitis with ascites   . Altered mental status 06/15/2015  . Hepatic cirrhosis (HCC) 06/15/2015  . Elevated troponin 06/15/2015  . Elevated CK 06/15/2015  . Fall 06/15/2015  . Maxillary sinus fracture (HCC) 04/05/2015  . Elevated LFTs 11/21/2014  . Hepatitis 11/21/2014  . Alcohol abuse 11/21/2014  . Hypokalemia   . Hypomagnesemia   . Acute encephalopathy   . Increased liver enzymes   . HTN (hypertension) 11/10/2014  . Polyarthritis of multiple sites (HCC)   . Esophageal reflux   . Alcoholic cirrhosis of liver without ascites (HCC)   . Shortness of breath 04/25/2014  . History of alcohol abuse 04/25/2014  . Lower extremity edema 04/25/2014  . Ascites 04/25/2014  . Rash 04/25/2014  . Left ankle pain 04/25/2014  . Hyponatremia   . Hyperbilirubinemia 03/01/2014  . Anemia 03/01/2014  . Thrombocytopenia (HCC) 03/01/2014  . Alcohol dependence with withdrawal with complication (HCC) 02/28/2014  . Alcoholic hepatitis without ascites 02/25/2014  . Acute hepatitis 02/25/2014    CBC    Component Value Date/Time   WBC 7.9 06/19/2015 0539   RBC 3.75* 06/19/2015 0539   RBC 3.41* 06/15/2015 1704   HGB 13.4 06/19/2015 0539   HCT 37.5* 06/19/2015 0539   PLT 107*  06/19/2015 0539   MCV 100.0 06/19/2015 0539   LYMPHSABS 0.5* 06/14/2015 2237   MONOABS 1.2* 06/14/2015 2237   EOSABS 0.0 06/14/2015 2237   BASOSABS 0.0 06/14/2015 2237    CMP     Component Value Date/Time   NA 135 06/19/2015 0539   K 3.5 06/19/2015 0539   CL 103 06/19/2015 0539   CO2 23 06/19/2015 0539   GLUCOSE 107* 06/19/2015 0539   BUN 8 06/19/2015 0539   CREATININE 0.80 06/19/2015 0539  CALCIUM 8.3* 06/19/2015 0539   PROT 5.3* 06/19/2015 0539   ALBUMIN 2.1* 06/19/2015 0539   AST 158* 06/19/2015 0539   ALT 89* 06/19/2015 0539   ALKPHOS 154* 06/19/2015 0539   BILITOT 10.4* 06/19/2015 0539   GFRNONAA >60 06/19/2015 0539   GFRAA >60 06/19/2015 0539    Assessment and Plan  Pt is d/c to home with HH. Medications have been reconciled and rx's written.  Time spent . 30 min;> 50% of time with patient was spent reviewing records, labs, tests and studies, counseling and developing plan of care  Margit Hanks, MD

## 2015-07-13 ENCOUNTER — Telehealth: Payer: Self-pay | Admitting: *Deleted

## 2015-07-13 NOTE — Telephone Encounter (Signed)
Boyd Kerbs, NP with [facility] called and wanted to know some information regarding patient's discharge with Home Health. Not a lot of information in OV note. Gave her the number to [facility], she is going to call them.

## 2015-07-19 ENCOUNTER — Ambulatory Visit: Payer: Self-pay | Admitting: Internal Medicine

## 2015-07-19 DIAGNOSIS — G312 Degeneration of nervous system due to alcohol: Secondary | ICD-10-CM

## 2015-07-19 DIAGNOSIS — F1020 Alcohol dependence, uncomplicated: Secondary | ICD-10-CM | POA: Insufficient documentation

## 2015-07-19 HISTORY — DX: Alcohol dependence, uncomplicated: F10.20

## 2015-07-19 HISTORY — DX: Degeneration of nervous system due to alcohol: G31.2

## 2015-09-12 ENCOUNTER — Encounter: Payer: Self-pay | Admitting: Nurse Practitioner

## 2015-11-09 DIAGNOSIS — M25551 Pain in right hip: Secondary | ICD-10-CM

## 2015-11-09 DIAGNOSIS — G8929 Other chronic pain: Secondary | ICD-10-CM | POA: Insufficient documentation

## 2015-11-09 HISTORY — DX: Other chronic pain: G89.29

## 2015-11-10 DIAGNOSIS — M1611 Unilateral primary osteoarthritis, right hip: Secondary | ICD-10-CM

## 2015-11-10 HISTORY — DX: Unilateral primary osteoarthritis, right hip: M16.11

## 2016-08-29 ENCOUNTER — Encounter: Payer: Self-pay | Admitting: Cardiology

## 2016-09-10 NOTE — Pre-Procedure Instructions (Signed)
Gary [name]  09/10/2016      [facility] - [place], [place] - [address] [place] [address] Phone: [phone] Fax: [phone]    Your procedure is scheduled on July 23  Report to [facility]mitting at [phone] A.M.  Call this number if you have problems the morning of surgery:  [phone]   Remember:  Do not eat food or drink liquids after midnight.   Take these medicines the morning of surgery with A SIP OF WATER eye drops if needed, pantoprazole (PROTONIX), propranolol (INDERAL), traMADol (ULTRAM) if needed  7 days prior to surgery STOP taking any Aleve, Naproxen, Ibuprofen, Motrin, Advil, Goody's, BC's, all herbal medications, fish oil, and all vitamins  Follow your doctors instructions regarding your Aspirin.  If no instructions were given by the doctor you will need to call the office to get instructions.  Your pre admission RN will also call for those instructions    Do not wear jewelry  Do not wear lotions, powders, or cologne, or deoderant.  Men may shave face and neck.  Do not bring valuables to the hospital.  [facility] is not responsible for any belongings or valuables.  Contacts, dentures or bridgework may not be worn into surgery.  Leave your suitcase in the car.  After surgery it may be brought to your room.  For patients admitted to the hospital, discharge time will be determined by your treatment team.  Patients discharged the day of surgery will not be allowed to drive home.   Special instructions:   [facility]- Preparing For Surgery  Before surgery, you can play an important role. Because skin is not sterile, your skin needs to be as free of germs as possible. You can reduce the number of germs on your skin by washing with CHG (chlorahexidine gluconate) Soap before surgery.  CHG is an antiseptic cleaner which kills germs and bonds with the skin to continue killing germs even after  washing.  Please do not use if you have an allergy to CHG or antibacterial soaps. If your skin becomes reddened/irritated stop using the CHG.  Do not shave (including legs and underarms) for at least 48 hours prior to first CHG shower. It is OK to shave your face.  Please follow these instructions carefully.   1. Shower the NIGHT BEFORE SURGERY and the MORNING OF SURGERY with CHG.   2. If you chose to wash your hair, wash your hair first as usual with your normal shampoo.  3. After you shampoo, rinse your hair and body thoroughly to remove the shampoo.  4. Use CHG as you would any other liquid soap. You can apply CHG directly to the skin and wash gently with a scrungie or a clean washcloth.   5. Apply the CHG Soap to your body ONLY FROM THE NECK DOWN.  Do not use on open wounds or open sores. Avoid contact with your eyes, ears, mouth and genitals (private parts). Wash genitals (private parts) with your normal soap.  6. Wash thoroughly, paying special attention to the area where your surgery will be performed.  7. Thoroughly rinse your body with warm water from the neck down.  8. DO NOT shower/wash with your normal soap after using and rinsing off the CHG Soap.  9. Pat yourself dry with a CLEAN TOWEL.   10. Wear CLEAN PAJAMAS   11. Place CLEAN SHEETS on your bed the night of  your first shower and DO NOT SLEEP WITH PETS.    Day of Surgery: Do not apply any deodorants/lotions. Please wear clean clothes to the hospital/surgery center.      Please read over the following fact sheets that you were given.

## 2016-09-11 ENCOUNTER — Ambulatory Visit (HOSPITAL_COMMUNITY): Admission: RE | Admit: 2016-09-11 | Payer: Medicare HMO | Source: Ambulatory Visit

## 2016-09-11 ENCOUNTER — Encounter (HOSPITAL_COMMUNITY): Payer: Self-pay

## 2016-09-11 ENCOUNTER — Encounter (HOSPITAL_COMMUNITY)
Admission: RE | Admit: 2016-09-11 | Discharge: 2016-09-11 | Disposition: A | Discharge status: Home / Self Care | Payer: Medicare HMO | Source: Ambulatory Visit | Attending: Orthopedic Surgery | Admitting: Orthopedic Surgery

## 2016-09-11 ENCOUNTER — Ambulatory Visit (HOSPITAL_COMMUNITY)
Admission: RE | Admit: 2016-09-11 | Discharge: 2016-09-11 | Disposition: A | Discharge status: Home / Self Care | Payer: Medicare HMO | Source: Ambulatory Visit | Attending: Orthopedic Surgery | Admitting: Orthopedic Surgery

## 2016-09-11 DIAGNOSIS — I700 Atherosclerosis of aorta: Secondary | ICD-10-CM | POA: Insufficient documentation

## 2016-09-11 DIAGNOSIS — R9431 Abnormal electrocardiogram [ECG] [EKG]: Secondary | ICD-10-CM | POA: Insufficient documentation

## 2016-09-11 DIAGNOSIS — Z01818 Encounter for other preprocedural examination: Secondary | ICD-10-CM | POA: Insufficient documentation

## 2016-09-11 HISTORY — DX: Gastro-esophageal reflux disease without esophagitis: K21.9

## 2016-09-11 HISTORY — DX: Unspecified convulsions: R56.9

## 2016-09-11 HISTORY — DX: Personal history of other diseases of the digestive system: Z87.19

## 2016-09-11 LAB — BASIC METABOLIC PANEL
Anion gap: 9 (ref 5–15)
BUN: 11 mg/dL (ref 6–20)
CALCIUM: 9.6 mg/dL (ref 8.9–10.3)
CO2: 22 mmol/L (ref 22–32)
CREATININE: 0.92 mg/dL (ref 0.61–1.24)
Chloride: 104 mmol/L (ref 101–111)
GFR calc non Af Amer: >60 mL/min (ref >60)
Glucose, Bld: 117 mg/dL — ABNORMAL HIGH (ref 65–99)
Potassium: 4.4 mmol/L (ref 3.5–5.1)
SODIUM: 135 mmol/L (ref 135–145)

## 2016-09-11 LAB — PROTIME-INR
INR: 1.37
PROTHROMBIN TIME: 17 s — AB (ref 11.4–15.2)

## 2016-09-11 LAB — TYPE AND SCREEN
ABO/RH(D): A POS
Antibody Screen: NEGATIVE

## 2016-09-11 LAB — CBC WITH DIFFERENTIAL/PLATELET
BASOS PCT: 0 %
Basophils Absolute: 0 10*3/uL (ref 0.0–0.1)
EOS ABS: 0.1 10*3/uL (ref 0.0–0.7)
EOS PCT: 1 %
HCT: 44.3 % (ref 39.0–52.0)
HEMOGLOBIN: 15 g/dL (ref 13.0–17.0)
LYMPHS ABS: 1.8 10*3/uL (ref 0.7–4.0)
Lymphocytes Relative: 28 %
MCH: 35.6 pg — AB (ref 26.0–34.0)
MCHC: 33.9 g/dL (ref 30.0–36.0)
MCV: 105.2 fL — ABNORMAL HIGH (ref 78.0–100.0)
MONOS PCT: 8 %
Monocytes Absolute: 0.5 10*3/uL (ref 0.1–1.0)
NEUTROS PCT: 62 %
Neutro Abs: 3.9 10*3/uL (ref 1.7–7.7)
PLATELETS: 104 10*3/uL — AB (ref 150–400)
RBC: 4.21 MIL/uL — AB (ref 4.22–5.81)
RDW: 13.2 % (ref 11.5–15.5)
WBC: 6.3 10*3/uL (ref 4.0–10.5)

## 2016-09-11 LAB — URINALYSIS, ROUTINE W REFLEX MICROSCOPIC
Bilirubin Urine: NEGATIVE
GLUCOSE, UA: NEGATIVE mg/dL
Hgb urine dipstick: NEGATIVE
Ketones, ur: NEGATIVE mg/dL
Leukocytes, UA: NEGATIVE
Nitrite: NEGATIVE
PH: 6 (ref 5.0–8.0)
Protein, ur: NEGATIVE mg/dL
SPECIFIC GRAVITY, URINE: 1.016 (ref 1.005–1.030)

## 2016-09-11 LAB — SURGICAL PCR SCREEN
MRSA, PCR: NEGATIVE
STAPHYLOCOCCUS AUREUS: NEGATIVE

## 2016-09-11 LAB — APTT: APTT: 40 s — AB (ref 24–36)

## 2016-09-11 NOTE — Progress Notes (Signed)
Mr. Phanthavong chart will be referred to anesth. for review. He reports that Rinaldo Cloud, FNP follows him for primary care. He has not followed up with GI as it appears that he has been encouraged to do. He had cardiac surveillance in 2017. He denies any chest concerns today. His voice is somewhat inconsistent, although he reports that its not hoarse like it had been.

## 2016-09-11 NOTE — Pre-Procedure Instructions (Signed)
NATASHA MOIX  09/11/2016      [facility] - [place], [place] - [address] [place] [address] Phone: [phone] Fax: [phone]    Your procedure is scheduled on July 23  Report to [facility] Admitting at [phone] A.M.  Call this number if you have problems the morning of surgery:  [phone]   Remember:  Do not eat food or drink liquids after midnight.  On 7/22 - Sunday   Take these medicines the morning of surgery with A SIP OF WATER eye drops if needed, pantoprazole (PROTONIX), propranolol (INDERAL), traMADol (ULTRAM) if needed  7 days prior to surgery STOP taking any Aleve, Naproxen, Ibuprofen, Motrin, Advil, Goody's, BC's, all herbal medications, fish oil, and all vitamins  Follow your doctors instructions regarding your Aspirin.  If no instructions were given by the doctor you will need to call the office to get instructions.  Your pre admission RN will also call for those instructions    Do not wear jewelry  Do not wear lotions, powders, or cologne, or deoderant.  Men may shave face and neck.  Do not bring valuables to the hospital.  [facility] is not responsible for any belongings or valuables.  Contacts, dentures or bridgework may not be worn into surgery.  Leave your suitcase in the car.  After surgery it may be brought to your room.  For patients admitted to the hospital, discharge time will be determined by your treatment team.  Patients discharged the day of surgery will not be allowed to drive home.   Special instructions:   [place]- Preparing For Surgery  Before surgery, you can play an important role. Because skin is not sterile, your skin needs to be as free of germs as possible. You can reduce the number of germs on your skin by washing with CHG (chlorahexidine gluconate) Soap before surgery.  CHG is an antiseptic cleaner which kills germs and bonds with the skin to continue killing germs  even after washing.  Please do not use if you have an allergy to CHG or antibacterial soaps. If your skin becomes reddened/irritated stop using the CHG.  Do not shave (including legs and underarms) for at least 48 hours prior to first CHG shower. It is OK to shave your face.  Please follow these instructions carefully.   1. Shower the NIGHT BEFORE SURGERY and the MORNING OF SURGERY with CHG.   2. If you chose to wash your hair, wash your hair first as usual with your normal shampoo.  3. After you shampoo, rinse your hair and body thoroughly to remove the shampoo.  4. Use CHG as you would any other liquid soap. You can apply CHG directly to the skin and wash gently with a scrungie or a clean washcloth.   5. Apply the CHG Soap to your body ONLY FROM THE NECK DOWN.  Do not use on open wounds or open sores. Avoid contact with your eyes, ears, mouth and genitals (private parts). Wash genitals (private parts) with your normal soap.  6. Wash thoroughly, paying special attention to the area where your surgery will be performed.  7. Thoroughly rinse your body with warm water from the neck down.  8. DO NOT shower/wash with your normal soap after using and rinsing off the CHG Soap.  9. Pat yourself dry with a CLEAN TOWEL.   10. Wear CLEAN PAJAMAS   11. Place CLEAN SHEETS on  your bed the night of your first shower and DO NOT SLEEP WITH PETS.    Day of Surgery: Do not apply any deodorants/lotions. Please wear clean clothes to the hospital/surgery center.      Please read over the following fact sheets that you were given.

## 2016-09-12 DIAGNOSIS — M1611 Unilateral primary osteoarthritis, right hip: Secondary | ICD-10-CM

## 2016-09-12 HISTORY — DX: Unilateral primary osteoarthritis, right hip: M16.11

## 2016-09-12 NOTE — H&P (Signed)
TOTAL HIP ADMISSION H&P  Patient is admitted for right total hip arthroplasty.  Subjective:  Chief Complaint: right hip pain  HPI: Gary [name], 66 y.o. male, has a history of pain and functional disability in the right hip(s) due to arthritis and patient has failed non-surgical conservative treatments for greater than 12 weeks to include NSAID's and/or analgesics, flexibility and strengthening excercises, use of assistive devices, weight reduction as appropriate and activity modification.  Onset of symptoms was gradual starting many years ago with gradually worsening course since that time.The patient noted no past surgery on the right hip(s).  Patient currently rates pain in the right hip at 10 out of 10 with activity. Patient has night pain, worsening of pain with activity and weight bearing, trendelenberg gait, pain that interfers with activities of daily living and pain with passive range of motion. Patient has evidence of subchondral sclerosis, joint space narrowing and flattening of femoral head by imaging studies. This condition presents safety issues increasing the risk of falls.   There is no current active infection.  Patient Active Problem List   Diagnosis Date Noted  . Vitamin D deficiency 06/28/2015  . Abnormal echocardiogram   . Alcoholic hepatitis with ascites   . Altered mental status 06/15/2015  . Hepatic cirrhosis (HCC) 06/15/2015  . Elevated troponin 06/15/2015  . Elevated CK 06/15/2015  . Fall 06/15/2015  . Maxillary sinus fracture (HCC) 04/05/2015  . Elevated LFTs 11/21/2014  . Hepatitis 11/21/2014  . Alcohol abuse 11/21/2014  . Hypokalemia   . Hypomagnesemia   . Acute encephalopathy   . Increased liver enzymes   . HTN (hypertension) 11/10/2014  . Polyarthritis of multiple sites   . Esophageal reflux   . Alcoholic cirrhosis of liver without ascites (HCC)   . Shortness of breath 04/25/2014  . History of alcohol abuse 04/25/2014  . Lower extremity edema  04/25/2014  . Ascites 04/25/2014  . Rash 04/25/2014  . Left ankle pain 04/25/2014  . Hyponatremia   . Hyperbilirubinemia 03/01/2014  . Anemia 03/01/2014  . Thrombocytopenia (HCC) 03/01/2014  . Alcohol dependence with withdrawal with complication (HCC) 02/28/2014  . Alcoholic hepatitis without ascites 02/25/2014  . Acute hepatitis 02/25/2014   Past Medical History:  Diagnosis Date  . Acute hepatitis    cirrohois  . Alcohol abuse   . Alcoholic cirrhosis of liver without ascites (HCC)   . Arthritis    + gout, also OA in hip  . CHF (congestive heart failure) (HCC)   . Elevated liver enzymes   . GERD (gastroesophageal reflux disease)   . History of hiatal hernia   . Hypercholesterolemia   . Hypertension   . Macrocytic anemia   . Seizures (HCC)    pt. denies   . Thrombocytopenia (HCC)   . Tremors of nervous system   . Vitamin D deficiency     Past Surgical History:  Procedure Laterality Date  . TONSILLECTOMY    . WISDOM TOOTH EXTRACTION      No prescriptions prior to admission.   No Known Allergies  Social History  Substance Use Topics  . Smoking status: Never Smoker  . Smokeless tobacco: Never Used  . Alcohol use 4.8 - 7.8 oz/week    1 Shots of liquor, 7 - 12 Cans of beer per week     Comment: Pt stated does not drink now - 03/16/2014         04/05/2015  " i  still drink 2 beers every date  "  Family History  Problem Relation Age of Onset  . Heart failure Father   . Alcohol abuse Father   . Heart attack Father   . COPD Mother   . Colon cancer Neg Hx   . Colon polyps Neg Hx   . Diabetes Neg Hx   . Kidney disease Neg Hx   . Gallbladder disease Neg Hx   . Esophageal cancer Neg Hx      Review of Systems  Constitutional: Positive for diaphoresis and malaise/fatigue.  HENT: Negative.   Respiratory: Positive for shortness of breath.   Cardiovascular: Positive for leg swelling.       Irregular heart beat  Gastrointestinal: Positive for constipation.   Genitourinary:       Poor bladder control  Musculoskeletal: Positive for joint pain.  Skin: Positive for rash.  Neurological: Negative.   Endo/Heme/Allergies: Negative.   Psychiatric/Behavioral: Negative.     Objective:  Physical Exam  Constitutional: He is oriented to person, place, and time. He appears well-developed and well-nourished.  HENT:  Head: Normocephalic and atraumatic.  Eyes: Pupils are equal, round, and reactive to light.  Neck: Normal range of motion. Neck supple.  Cardiovascular: Intact distal pulses.   Respiratory: Effort normal.  Musculoskeletal: He exhibits tenderness.  Any attempts of internal or external rotation of the right hip is painful.  Leg lengths appear to be grossly equal. He is walking with a walker.  His wife is with him today.  No sense of real back pain to palpation back motion is relatively good. Straight leg raising is painful with movement of the hip on the right side. Sensory motor function otherwise distally is intact. Knee and ankle within normal limits.  In addition the patient is well developed, well nourished, and in no apparent distress.  Also alert and oriented times three.  There are normal extraocular motions and pupils are equal and reactive.  Neurological: He is alert and oriented to person, place, and time.  Skin: Skin is warm and dry.  Psychiatric: He has a normal mood and affect. His behavior is normal. Judgment and thought content normal.    Vital signs in last 24 hours:    Labs:   Estimated body mass index is 28.7 kg/m as calculated from the following:   Height as of 09/11/16: 5\' 10"  (1.778 m).   Weight as of 09/11/16: 90.7 kg (200 lb).   Imaging Review Plain radiographs demonstrate  severe end-stage arthritis of the right hip with complete loss of his joint space and flattening of the femoral head.  Assessment/Plan:  End stage arthritis, right hip(s)  The patient history, physical examination, clinical judgement of  the provider and imaging studies are consistent with end stage degenerative joint disease of the right hip(s) and total hip arthroplasty is deemed medically necessary. The treatment options including medical management, injection therapy, arthroscopy and arthroplasty were discussed at length. The risks and benefits of total hip arthroplasty were presented and reviewed. The risks due to aseptic loosening, infection, stiffness, dislocation/subluxation,  thromboembolic complications and other imponderables were discussed.  The patient acknowledged the explanation, agreed to proceed with the plan and consent was signed. Patient is being admitted for inpatient treatment for surgery, pain control, PT, OT, prophylactic antibiotics, VTE prophylaxis, progressive ambulation and ADL's and discharge planning.The patient is planning to be discharged to skilled nursing facility

## 2016-09-15 NOTE — Progress Notes (Addendum)
Anesthesia Chart Review:  Pt is a 66 year old male scheduled for R total hip arthroplasty anterior approach on 09/22/2016 with Gean Birchwood, MD  - PCP is Zoe Lan, FNP who is aware of upcoming surgery (notes in care everywhere)   PMH includes: CHF, HTN, alcoholic cirrhosis, alcohol abuse, thrombocytopenia, macrocytic anemia, GERD. Never smoker. BMI 29.  - Hospitalized 4/13-19/17 for acute encephalopathy, acute alcoholic hepatitis, cirrhosis, thrombocytopenia. Complicated by LV dysfunction (EF 40-45%). Re-evaluation of EF in 3 months recommended but did not happen.   Medications include: ASA 81 mg, folic acid, Lasix, lactulose, lisinopril, Protonix, propranolol, spironolactone  Preoperative labs reviewed.   - BMET acceptable for surgery - liver function tests not obtained. Prior results from 08/07/16 (care everywhere) show AST slightly elevated at 52. Will get HFP DOS.  - PT 17.0, PTT 40. Will recheck DOS.  - Platelets 104. Will recheck DOS.   CXR 09/11/16:  1.  No acute cardiopulmonary disease. 2. Thoracic aortic atherosclerosis.  EKG 09/11/16: Sinus rhythm with 1st degree A-V block. LAD. Low voltage QRS. Possible Inferior infarct, age undetermined. Inferior Q waves present on EKG 06/16/15.   Nuclear stress test 06/19/15:  1. No reversible ischemia or infarction. 2. Normal left ventricular wall motion. 3. Left ventricular ejection fraction 47% 4. Low-risk stress test findings  Echo 06/17/15:  - Left ventricle: The cavity size was normal. Systolic function was mildly to moderately reduced. The estimated ejection fraction was in the range of 40% to 45%. There is akinesis of the entire inferolateral myocardium. Probable hypokinesis of the entire inferior myocardium. - Right ventricle: Systolic function was moderately reduced.  I notified Olegario Messier in Dr. Wadie Lessen office pt would need to have cardiomyopathy rechecked prior to surgery.   Rica Mast, FNP-BC [facility] Short Stay Surgical  Center/Anesthesiology Phone: [phone] 09/15/2016 1:22 PM   Addendum:   Pt saw Norman Herrlich, MD with cardiology 09/17/16 for recheck cardiomyopathy. 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 Unfortunately he is volume overloaded either from heart disease or cirrhosis and continues to drink." Echo ordered to recheck EF; if EF "stable he is optimized and ready for the surgical procedure. I would continue his beta blocker and diuretics and place him in a monitored bed with his ectopic atrial rhythm as he is at risk for atrial fibrillation."  Echo 09/19/16:  - Procedure narrative: Transthoracic echocardiography. Image quality was adequate. The study was technically difficult. Intravenous contrast (Definity) was administered. - Left ventricle: The cavity size was normal. Wall thickness was normal. Systolic function was vigorous. The estimated ejection fraction was in the range of 65% to 70%. Wall motion was normal; there were no regional wall motion abnormalities. Doppler parameters are consistent with abnormal left ventricular relaxation (grade 1 diastolic dysfunction).  If labs acceptable DOS, I anticipate pt can proceed as scheduled.   Rica Mast, FNP-BC [facility] Short Stay Surgical Center/Anesthesiology Phone: [phone] 09/19/2016 4:25 PM

## 2016-09-17 ENCOUNTER — Encounter: Payer: Self-pay | Admitting: Cardiology

## 2016-09-17 ENCOUNTER — Ambulatory Visit (INDEPENDENT_AMBULATORY_CARE_PROVIDER_SITE_OTHER): Payer: Medicare HMO | Admitting: Cardiology

## 2016-09-17 VITALS — BP 98/70 | HR 97 | Ht 70.0 in | Wt 206.0 lb

## 2016-09-17 DIAGNOSIS — I444 Left anterior fascicular block: Secondary | ICD-10-CM | POA: Diagnosis not present

## 2016-09-17 DIAGNOSIS — Z01810 Encounter for preprocedural cardiovascular examination: Secondary | ICD-10-CM | POA: Diagnosis not present

## 2016-09-17 DIAGNOSIS — I429 Cardiomyopathy, unspecified: Secondary | ICD-10-CM | POA: Insufficient documentation

## 2016-09-17 DIAGNOSIS — I491 Atrial premature depolarization: Secondary | ICD-10-CM

## 2016-09-17 DIAGNOSIS — R9431 Abnormal electrocardiogram [ECG] [EKG]: Secondary | ICD-10-CM

## 2016-09-17 NOTE — Patient Instructions (Addendum)
Medication Instructions:  Your physician recommends that you continue on your current medications as directed. Please refer to the Current Medication list given to you today.   Labwork: None  Testing/Procedures: Your physician has requested that you have an echocardiogram. Echocardiography is a painless test that uses sound waves to create images of your heart. It provides your doctor with information about the size and shape of your heart and how well your heart's chambers and valves are working. This procedure takes approximately one hour. There are no restrictions for this procedure.    Follow-Up: Your physician wants you to follow-up in: 3 months. You will receive a reminder letter in the mail two months in advance. If you don't receive a letter, please call our office to schedule the follow-up appointment.   Any Other Special Instructions Will Be Listed Below (If Applicable).     If you need a refill on your cardiac medications before your next appointment, please call your pharmacy.   DASH diet: Healthy eating to lower your blood pressure The DASH diet emphasizes portion size, eating a variety of foods and getting the right amount of nutrients. Discover how DASH can improve your health and lower your blood pressure. By [facility] Staff  DASH stands for Dietary Approaches to Stop Hypertension. The DASH diet is a lifelong approach to healthy eating that's designed to help treat or prevent high blood pressure (hypertension). The DASH diet encourages you to reduce the sodium in your diet and eat a variety of foods rich in nutrients that help lower blood pressure, such as potassium, calcium and magnesium. By following the DASH diet, you may be able to reduce your blood pressure by a few points in just two weeks. Over time, your systolic blood pressure could drop by eight to 14 points, which can make a significant difference in your health risks. Because the DASH diet is a healthy way  of eating, it offers health benefits besides just lowering blood pressure. The DASH diet is also in line with dietary recommendations to prevent osteoporosis, cancer, heart disease, stroke and diabetes. DASH diet: Sodium levels The DASH diet emphasizes vegetables, fruits and low-fat dairy foods - and moderate amounts of whole grains, fish, poultry and nuts. In addition to the standard DASH diet, there is also a lower sodium version of the diet. You can choose the version of the diet that meets your health needs: Standard DASH diet. You can consume up to 2,300 milligrams (mg) of sodium a day.  Lower sodium DASH diet. You can consume up to 1,500 mg of sodium a day. Both versions of the DASH diet aim to reduce the amount of sodium in your diet compared with what you might get in a typical American diet, which can amount to a whopping 3,400 mg of sodium a day or more. The standard DASH diet meets the recommendation from the Dietary Guidelines for Americans to keep daily sodium intake to less than 2,300 mg a day. The American Heart Association recommends 1,500 mg a day of sodium as an upper limit for all adults. If you aren't sure what sodium level is right for you, talk to your doctor. DASH diet: What to eat Both versions of the DASH diet include lots of whole grains, fruits, vegetables and low-fat dairy products. The DASH diet also includes some fish, poultry and legumes, and encourages a small amount of nuts and seeds a few times a week.  You can eat red meat, sweets and fats in small  amounts. The DASH diet is low in saturated fat, cholesterol and total fat. Here's a look at the recommended servings from each food group for the 2,000-calorie-a-day DASH diet. Grains: 6 to 8 servings a day Grains include bread, cereal, rice and pasta. Examples of one serving of grains include 1 slice whole-wheat bread, 1 ounce dry cereal, or 1/2 cup cooked cereal, rice or pasta. Focus on whole grains because they have  more fiber and nutrients than do refined grains. For instance, use brown rice instead of white rice, whole-wheat pasta instead of regular pasta and whole-grain bread instead of white bread. Look for products labeled "100 percent whole grain" or "100 percent whole wheat."  Grains are naturally low in fat. Keep them this way by avoiding butter, cream and cheese sauces. Vegetables: 4 to 5 servings a day Tomatoes, carrots, broccoli, sweet potatoes, greens and other vegetables are full of fiber, vitamins, and such minerals as potassium and magnesium. Examples of one serving include 1 cup raw leafy green vegetables or 1/2 cup cut-up raw or cooked vegetables. Don't think of vegetables only as side dishes - a hearty blend of vegetables served over brown rice or whole-wheat noodles can serve as the main dish for a meal.  Fresh and frozen vegetables are both good choices. When buying frozen and canned vegetables, choose those labeled as low sodium or without added salt.  To increase the number of servings you fit in daily, be creative. In a stir-fry, for instance, cut the amount of meat in half and double up on the vegetables. Fruits: 4 to 5 servings a day Many fruits need little preparation to become a healthy part of a meal or snack. Like vegetables, they're packed with fiber, potassium and magnesium and are typically low in fat - coconuts are an exception. Examples of one serving include one medium fruit, 1/2 cup fresh, frozen or canned fruit, or 4 ounces of juice. Have a piece of fruit with meals and one as a snack, then round out your day with a dessert of fresh fruits topped with a dollop of low-fat yogurt.  Leave on edible peels whenever possible. The peels of apples, pears and most fruits with pits add interesting texture to recipes and contain healthy nutrients and fiber.  Remember that citrus fruits and juices, such as grapefruit, can interact with certain medications, so check with your doctor or  pharmacist to see if they're OK for you.  If you choose canned fruit or juice, make sure no sugar is added. Dairy: 2 to 3 servings a day Milk, yogurt, cheese and other dairy products are major sources of calcium, vitamin D and protein. But the key is to make sure that you choose dairy products that are low fat or fat-free because otherwise they can be a major source of fat - and most of it is saturated. Examples of one serving include 1 cup skim or 1 percent milk, 1 cup low fat yogurt, or 1 1/2 ounces part-skim cheese. Low-fat or fat-free frozen yogurt can help you boost the amount of dairy products you eat while offering a sweet treat. Add fruit for a healthy twist.  If you have trouble digesting dairy products, choose lactose-free products or consider taking an over-the-counter product that contains the enzyme lactase, which can reduce or prevent the symptoms of lactose intolerance.  Go easy on regular and even fat-free cheeses because they are typically high in sodium. Lean meat, poultry and fish: 6 servings or fewer a day Meat  can be a rich source of protein, B vitamins, iron and zinc. Choose lean varieties and aim for no more than 6 ounces a day. Cutting back on your meat portion will allow room for more vegetables. Trim away skin and fat from poultry and meat and then bake, broil, grill or roast instead of frying in fat.  Eat heart-healthy fish, such as salmon, herring and tuna. These types of fish are high in omega-3 fatty acids, which can help lower your total cholesterol. Nuts, seeds and legumes: 4 to 5 servings a week Almonds, sunflower seeds, kidney beans, peas, lentils and other foods in this family are good sources of magnesium, potassium and protein. They're also full of fiber and phytochemicals, which are plant compounds that may protect against some cancers and cardiovascular disease. Serving sizes are small and are intended to be consumed only a few times a week because these foods are  high in calories. Examples of one serving include 1/3 cup nuts, 2 tablespoons seeds, or 1/2 cup cooked beans or peas.  Nuts sometimes get a bad rap because of their fat content, but they contain healthy types of fat - monounsaturated fat and omega-3 fatty acids. They're high in calories, however, so eat them in moderation. Try adding them to stir-fries, salads or cereals.  Soybean-based products, such as tofu and tempeh, can be a good alternative to meat because they contain all of the amino acids your body needs to make a complete protein, just like meat. Fats and oils: 2 to 3 servings a day Fat helps your body absorb essential vitamins and helps your body's immune system. But too much fat increases your risk of heart disease, diabetes and obesity. The DASH diet strives for a healthy balance by limiting total fat to less than 30 percent of daily calories from fat, with a focus on the healthier monounsaturated fats. Examples of one serving include 1 teaspoon soft margarine, 1 tablespoon mayonnaise or 2 tablespoons salad dressing. Saturated fat and trans fat are the main dietary culprits in increasing your risk of coronary artery disease. DASH helps keep your daily saturated fat to less than 6 percent of your total calories by limiting use of meat, butter, cheese, whole milk, cream and eggs in your diet, along with foods made from lard, solid shortenings, and palm and coconut oils.  Avoid trans fat, commonly found in such processed foods as crackers, baked goods and fried items.  Read food labels on margarine and salad dressing so that you can choose those that are lowest in saturated fat and free of trans fat. Sweets: 5 servings or fewer a week You don't have to banish sweets entirely while following the DASH diet - just go easy on them. Examples of one serving include 1 tablespoon sugar, jelly or jam, 1/2 cup sorbet, or 1 cup lemonade. When you eat sweets, choose those that are fat-free or low-fat, such  as sorbets, fruit ices, jelly beans, hard candy, graham crackers or low-fat cookies.  Artificial sweeteners such as aspartame (NutraSweet, Equal) and sucralose (Splenda) may help satisfy your sweet tooth while sparing the sugar. But remember that you still must use them sensibly. It's OK to swap a diet cola for a regular cola, but not in place of a more nutritious beverage such as low-fat milk or even plain water.  Cut back on added sugar, which has no nutritional value but can pack on calories. DASH diet: Alcohol and caffeine Drinking too much alcohol can increase blood pressure. The  Dietary Guidelines for Americans recommends that men limit alcohol to no more than two drinks a day and women to one or less. The DASH diet doesn't address caffeine consumption. The influence of caffeine on blood pressure remains unclear. But caffeine can cause your blood pressure to rise at least temporarily. If you already have high blood pressure or if you think caffeine is affecting your blood pressure, talk to your doctor about your caffeine consumption. DASH diet and weight loss While the DASH diet is not a weight-loss program, you may indeed lose unwanted pounds because it can help guide you toward healthier food choices. The DASH diet generally includes about 2,000 calories a day. If you're trying to lose weight, you may need to eat fewer calories. You may also need to adjust your serving goals based on your individual circumstances - something your health care team can help you decide. Tips to cut back on sodium The foods at the core of the DASH diet are naturally low in sodium. So just by following the DASH diet, you're likely to reduce your sodium intake. You also reduce sodium further by: Using sodium-free spices or flavorings with your food instead of salt  Not adding salt when cooking rice, pasta or hot cereal  Rinsing canned foods to remove some of the sodium  Buying foods labeled "no salt added,"  "sodium-free," "low sodium" or "very low sodium" One teaspoon of table salt has 2,325 mg of sodium. When you read food labels, you may be surprised at just how much sodium some processed foods contain. Even low-fat soups, canned vegetables, ready-to-eat cereals and sliced [place] from the local deli - foods you may have considered healthy - often have lots of sodium. You may notice a difference in taste when you choose low-sodium food and beverages. If things seem too bland, gradually introduce low-sodium foods and cut back on table salt until you reach your sodium goal. That'll give your palate time to adjust. Using salt-free seasoning blends or herbs and spices may also ease the transition. It can take several weeks for your taste buds to get used to less salty foods. Putting the pieces of the DASH diet together Try these strategies to get started on the DASH diet:  Change gradually. If you now eat only one or two servings of fruits or vegetables a day, try to add a serving at lunch and one at dinner. Rather than switching to all whole grains, start by making one or two of your grain servings whole grains. Increasing fruits, vegetables and whole grains gradually can also help prevent bloating or diarrhea that may occur if you aren't used to eating a diet with lots of fiber. You can also try over-the-counter products to help reduce gas from beans and vegetables.  Reward successes and forgive slip-ups. Reward yourself with a nonfood treat for your accomplishments - rent a movie, purchase a book or get together with a friend. Everyone slips, especially when learning something new. Remember that changing your lifestyle is a long-term process. Find out what triggered your setback and then just pick up where you left off with the DASH diet.  Add physical activity. To boost your blood pressure lowering efforts even more, consider increasing your physical activity in addition to following the DASH diet. Combining  both the DASH diet and physical activity makes it more likely that you'll reduce your blood pressure.  Get support if you need it. If you're having trouble sticking to your diet, talk to your  doctor or dietitian about it. You might get some tips that will help you stick to the DASH diet. Remember, healthy eating isn't an all-or-nothing proposition. What's most important is that, on average, you eat healthier foods with plenty of variety - both to keep your diet nutritious and to avoid boredom or extremes. And with the DASH diet, you can have both.

## 2016-09-17 NOTE — Progress Notes (Signed)
Cardiology Office Note:    Date:  09/17/2016   ID:  Gary [name], DOB 1950-08-23, MRN [id number]  PCP:  Gary Hansen, NP  Cardiologist:  Norman Herrlich, MD   Referring MD: Gary Hansen, NP  ASSESSMENT:    1. Preoperative cardiovascular examination   2. Abnormal EKG   3. Left anterior fascicular block (LAFB)   4. Cardiomyopathy, unspecified type (HCC)   5. Ectopic atrial rhythm    PLAN:    In order of problems listed above:  1. 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 Unfortunately he is volume overloaded either from heart disease or cirrhosis and continues to drink. His baseline ejection fraction is in the range of 40-45% by echocardiogram 47% by MUGA. He's had a previous ischemia evaluation without evidence of obstructive CAD. His exercise tolerance cannot be assessed. Prior to surgery on an echocardiogram Friday morning 9 AM it'll be reviewed by my partner Dr. Tomie China providers ejection fraction is stable he is optimized and ready for the surgical procedure. I would continue his beta blocker and diuretics and place him in a monitored bed with his ectopic atrial rhythm as he is at risk for atrial fibrillation. If needed please contact the rounding cardiologist to assist in his care. 2. Stable pattern left anterior hemiblock. His rhythm today is ectopic atrial tachycardia with blocked APCs. 3. See above 4. For quick echocardiogram to assess left ventricular function that can precipitously deterioration alcoholic cardiomyopathy with continued alcohol abuse. I would continue his current medications including diuretic MRA beta blocker and lisinopril. 5. Stable right continue his beta blocker I would monitor him throughout the hospitalization he is at risk for atrial fibrillation and if needed please contact the cardiology service to see him in follow-up.  Next appointment   Medication Adjustments/Labs and Tests  Ordered: Current medicines are reviewed at length with the patient today.  Concerns regarding medicines are outlined above.  Orders Placed This Encounter  Procedures  . ECHOCARDIOGRAM COMPLETE   No orders of the defined types were placed in this encounter.    Chief Complaint  Patient presents with  . New Patient (Initial Visit)    Preop clearance per Dr Turner Daniels prior to hip replacement scheduled for next week   3 months  History of Present Illness:    [name] is a 66 y.o. male who is being seen today for the evaluation of Abnormal EKG and preoperative cardiovascular evaluation pending right total hip arthroplasty by Dr. Turner Daniels anticipated Monday, 09/22/2016 at the request of Gary Hansen, NP. He has a background history of alcohol abuse cirrhosis previous left ventricular dysfunction alcoholic cardiomyopathy and previous ectopic atrial rhythm one hospitalized 2017. Unfortunately he continues to drink. He is on a loop diuretic and spironolactone and propranolol I presume for ascites edema and cirrhosis. He is very sedentary chair bound but does not have shortness of breath palpitation chest pain or syncope. He has no rheumatic coronary artery disease and underwent an ischemia evaluation in 2017. He does fluid restrict.  Past Medical History:  Diagnosis Date  . Abnormal echocardiography   . Acute encephalopathy   . Acute hepatitis    cirrohois  . Alcohol abuse   . Alcohol dependence (HCC) 02/28/2014  . Alcoholic cirrhosis (HCC)    Overview:  Hx of encephalopathy  . Alcoholic cirrhosis of liver without ascites (HCC)   . Alcoholic encephalopathy (HCC) 04/13/1550  . Alcoholic hepatitis  Overview:  Last Assessment & Plan:  SNF - pt started on aldactone and propranolol; cont same  . Alcoholic hepatitis without ascites 02/25/2014  . Altered mental status 06/15/2015  . Arthritis    + gout, also OA in hip  . Arthritis of right hip 09/12/2016  . Ascites 04/25/2014  . CHF (congestive  heart failure) (HCC)   . Chronic pain of right hip 11/09/2015  . Elevated CK 06/15/2015  . Elevated LFTs 11/21/2014  . Elevated liver enzymes   . Elevated troponin 06/15/2015  . Esophageal reflux   . Essential hypertension 11/10/2014   Overview:  Was on lisinopril 20 mg daily per records  . Fall 06/15/2015  . GERD (gastroesophageal reflux disease)   . Hepatic cirrhosis (HCC) 06/15/2015  . History of alcohol abuse 04/25/2014  . History of hiatal hernia   . Hyperbilirubinemia 03/01/2014   Overview:  Last Assessment & Plan:  Likely due to liver cirrhosis, ascites has macrocytic anemia, thrombocytopenia, hepatic encephalopathy/hyperammonemia - CT abdomen showed hepatic cirrhosis with degenerative nodules - Ultrasound showed severe hepatic steatosis, trace amount of perisplenic ascites - on steroids for acute alcoholic hepatitis, patient has GI appointment for further steroid taper outpatient , continue current dose for 28 days. - AFP 2.4 - Placed on propranolol, Aldactone 50 mg daily. Follow potassium level. SNF - cont propranolol and aldactone; f/u CMP for K and bilirubin  . Hypercholesterolemia   . Hypertension   . Hypokalemia   . Hypomagnesemia   . Hyponatremia   . Increased liver enzymes   . Inflammatory liver disease 11/21/2014  . Left ankle pain 04/25/2014  . Lower extremity edema 04/25/2014  . Macrocytic anemia   . Maxillary sinus fracture (HCC) 04/05/2015  . Osteoarthritis of right hip 11/10/2015  . Polyarthritis of multiple sites   . Pure hypercholesterolemia 10/19/2014   Overview:  LDL 206 per records 03/2014. Not on statin due to liver disease  . Rash 04/25/2014  . Seizures (HCC)    pt. denies   . Shortness of breath 04/25/2014  . Thrombocytopenia (HCC)   . Tremors of nervous system   . Vitamin D deficiency     Past Surgical History:  Procedure Laterality Date  . TONSILLECTOMY    . WISDOM TOOTH EXTRACTION      Current Medications: Current Meds  Medication Sig  . aspirin EC 81 MG  tablet Take 81 mg by mouth daily.  . folic acid (FOLVITE) 1 MG tablet Take 1 mg by mouth daily.  . furosemide (LASIX) 40 MG tablet Take 40 mg by mouth daily.  . hydroxypropyl methylcellulose / hypromellose (ISOPTO TEARS / GONIOVISC) 2.5 % ophthalmic solution Place 1 drop into both eyes 4 (four) times daily as needed for dry eyes.  Marland Kitchen lactulose (CHRONULAC) 10 GM/15ML solution Take 45 mLs (30 g total) by mouth 3 (three) times daily. (Patient taking differently: Take 10 g by mouth 3 (three) times daily. )  . lisinopril (PRINIVIL,ZESTRIL) 5 MG tablet Take 5 mg by mouth daily.  . Multiple Vitamin (MULTIVITAMIN WITH MINERALS) TABS tablet Take 1 tablet by mouth daily.  . Omega-3 Fatty Acids (FISH OIL) 500 MG CAPS Take 500 mg by mouth daily.  . pantoprazole (PROTONIX) 40 MG tablet Take 1 tablet (40 mg total) by mouth daily. (Patient taking differently: Take 40 mg by mouth daily before breakfast. )  . propranolol (INDERAL) 10 MG tablet Take 1 tablet (10 mg total) by mouth 2 (two) times daily.  Marland Kitchen spironolactone (ALDACTONE) 50 MG tablet Take  1 tablet (50 mg total) by mouth daily.  . traMADol (ULTRAM) 50 MG tablet Take 50 mg by mouth every 6 (six) hours as needed for moderate pain.  . Vitamin D, Ergocalciferol, (DRISDOL) 50000 UNITS CAPS capsule Take 50,000 Units by mouth every 7 (seven) days.      Allergies:   Patient has no known allergies.   Social History   Social History  . Marital status: Married    Spouse name: N/A  . Number of children: 2  . Years of education: N/A   Occupational History  . Retired    Social History Main Topics  . Smoking status: Never Smoker  . Smokeless tobacco: Never Used  . Alcohol use 4.8 - 7.8 oz/week    1 Shots of liquor, 7 - 12 Cans of beer per week     Comment: Pt stated does not drink now - 03/16/2014         04/05/2015  " i  still drink 2 beers every date  "  . Drug use: No  . Sexual activity: Yes   Other Topics Concern  . None   Social History Narrative    . None     Family History: The patient's family history includes Alcohol abuse in his father; COPD in his mother; Heart attack in his father; Heart failure in his father. There is no history of Colon cancer, Colon polyps, Diabetes, Kidney disease, Gallbladder disease, or Esophageal cancer.  ROS:   ROS Please see the history of present illness.     All other systems reviewed and are negative.  EKGs/Labs/Other Studies Reviewed:    The following studies were reviewed today: Cardiology records including EKGs echocardiogram and nuclear myocardial perfusion study from 2017 this month in the office today reviewed with the patient and wife.  EKG:  EKG idone recently preoperative.  The ekg demonstrates SRTh LAHB and independently reviewed  Recent Labs: 09/11/2016: BUN 11; Creatinine, Ser 0.92; Hemoglobin 15.0; Platelets 104; Potassium 4.4; Sodium 135  Recent Lipid Panel No results found for: CHOL, TRIG, HDL, CHOLHDL, VLDL, LDLCALC, LDLDIRECT  Physical Exam:    VS:  BP 98/70 (BP Location: Right Arm)   Pulse 97   Ht 5\' 10"  (1.778 m)   Wt 206 lb (93.4 kg)   SpO2 95%   BMI 29.56 kg/m     Wt Readings from Last 3 Encounters:  09/17/16 206 lb (93.4 kg)  09/11/16 200 lb (90.7 kg)  07/05/15 216 lb 4.3 oz (98.1 kg)    Repeat blood pressure by me sitting right upper extremity 128/80 GEN: He appears chronically ill poor muscle mass has gynecomastia and marked edema to the mid thighs 3-4+. He has mild neck vein distention but no hepatojugular reflux. HEENT: Normal NECK: No No carotid bruits LYMPHATICS: No lymphadenopathy CARDIAC: RRR, no murmurs, rubs, gallops RESPIRATORY:  Clear to auscultation without rales, wheezing or rhonchi  ABDOMEN: Soft, non-tender, distended likely ascites umbilical hernia. MUSCULOSKELETAL: No deformity  SKIN: Warm and dry NEUROLOGIC:  Alert and oriented x 3 PSYCHIATRIC:  Normal affect     Signed, Norman Herrlich, MD  09/17/2016 12:32 PM    [facility]

## 2016-09-18 ENCOUNTER — Telehealth: Payer: Self-pay

## 2016-09-18 NOTE — Telephone Encounter (Signed)
P/c with patient he was calling to con firm need for echo tomorrow, per note states low risk for sx if echo shows good result.cn

## 2016-09-19 ENCOUNTER — Encounter (INDEPENDENT_AMBULATORY_CARE_PROVIDER_SITE_OTHER): Payer: Self-pay

## 2016-09-19 ENCOUNTER — Ambulatory Visit (HOSPITAL_BASED_OUTPATIENT_CLINIC_OR_DEPARTMENT_OTHER)
Admission: RE | Admit: 2016-09-19 | Discharge: 2016-09-19 | Disposition: A | Discharge status: Home / Self Care | Payer: Medicare HMO | Source: Ambulatory Visit | Attending: Cardiology | Admitting: Cardiology

## 2016-09-19 DIAGNOSIS — I429 Cardiomyopathy, unspecified: Secondary | ICD-10-CM

## 2016-09-19 DIAGNOSIS — R9431 Abnormal electrocardiogram [ECG] [EKG]: Secondary | ICD-10-CM | POA: Diagnosis not present

## 2016-09-19 MED ORDER — TRANEXAMIC ACID 1000 MG/10ML IV SOLN
2000.0000 mg | INTRAVENOUS | Status: AC
  Administered 2016-09-22: 2000 mg via TOPICAL
  Filled 2016-09-19: qty 20

## 2016-09-19 MED ORDER — BUPIVACAINE LIPOSOME 1.3 % IJ SUSP
20.0000 mL | Freq: Once | INTRAMUSCULAR | Status: AC
  Administered 2016-09-22: 20 mL
  Filled 2016-09-19: qty 20

## 2016-09-19 MED ORDER — CEFAZOLIN SODIUM-DEXTROSE 2-4 GM/100ML-% IV SOLN
2.0000 g | INTRAVENOUS | Status: AC
  Administered 2016-09-22: 2 g via INTRAVENOUS
  Filled 2016-09-19: qty 100

## 2016-09-19 MED ORDER — PERFLUTREN LIPID MICROSPHERE
1.0000 mL | INTRAVENOUS | Status: AC | PRN
  Filled 2016-09-19: qty 10

## 2016-09-19 MED ORDER — TRANEXAMIC ACID 1000 MG/10ML IV SOLN
1000.0000 mg | INTRAVENOUS | Status: AC
  Administered 2016-09-22: 1000 mg via INTRAVENOUS
  Filled 2016-09-19: qty 1100

## 2016-09-19 MED ORDER — DEXTROSE-NACL 5-0.45 % IV SOLN: INTRAVENOUS | Status: DC

## 2016-09-19 MED ORDER — PERFLUTREN LIPID MICROSPHERE
INTRAVENOUS | Status: AC
  Filled 2016-09-19: qty 10

## 2016-09-19 NOTE — Progress Notes (Signed)
  Echocardiogram 2D Echocardiogram has been performed.  Arvil Chaco 09/19/2016, 10:57 AM

## 2016-09-21 ENCOUNTER — Encounter (HOSPITAL_COMMUNITY): Payer: Self-pay | Admitting: Anesthesiology

## 2016-09-21 NOTE — Anesthesia Preprocedure Evaluation (Addendum)
Anesthesia Evaluation  Patient identified by MRN, date of birth, ID band Patient awake    Reviewed: Allergy & Precautions, NPO status , Patient's Chart, lab work & pertinent test results  Airway Mallampati: I  TM Distance: >3 FB Neck ROM: Full    Dental  (+) Dental Advidsory Given, Poor Dentition, Chipped,    Pulmonary neg pulmonary ROS,    breath sounds clear to auscultation       Cardiovascular hypertension, Pt. on medications +CHF  + dysrhythmias  Rhythm:Regular Rate:Normal     Neuro/Psych Seizures -,  PSYCHIATRIC DISORDERS    GI/Hepatic Neg liver ROS, hiatal hernia, GERD  Medicated,(+) Cirrhosis       , Hepatitis -, Toxin Related  Endo/Other  negative endocrine ROS  Renal/GU negative Renal ROS     Musculoskeletal  (+) Arthritis , Osteoarthritis,    Abdominal   Peds  Hematology negative hematology ROS (+)   Anesthesia Other Findings Day of surgery medications reviewed with the patient. - Left ventricle: The cavity size was normal. Wall thickness was   normal. Systolic function was vigorous. The estimated ejection   fraction was in the range of 65% to 70%. Wall motion was normal;   there were no regional wall motion abnormalities. Doppler   parameters are consistent with abnormal left ventricular   relaxation (grade 1 diastolic dysfunction).  Reproductive/Obstetrics                           Lab Results  Component Value Date   WBC 6.3 09/11/2016   HGB 15.0 09/11/2016   HCT 44.3 09/11/2016   MCV 105.2 (H) 09/11/2016   PLT 104 (L) 09/11/2016   Lab Results  Component Value Date   CREATININE 0.92 09/11/2016   BUN 11 09/11/2016   NA 135 09/11/2016   K 4.4 09/11/2016   CL 104 09/11/2016   CO2 22 09/11/2016   Lab Results  Component Value Date   INR 1.37 09/11/2016   INR 1.98 (H) 06/18/2015   INR 2.19 (H) 06/16/2015   EKG: SR with 1st degree AV block  Echo: Left ventricle:  The cavity size was normal. Wall thickness was   normal. Systolic function was vigorous. The estimated ejection   fraction was in the range of 65% to 70%. Wall motion was normal;   there were no regional wall motion abnormalities. Doppler   parameters are consistent with abnormal left ventricular   relaxation (grade 1 diastolic dysfunction).  No AV pathology.    Anesthesia Physical Anesthesia Plan  ASA: III  Anesthesia Plan: Spinal   Post-op Pain Management:    Induction: Intravenous  PONV Risk Score and Plan: 2 and Ondansetron, Dexamethasone and Propofol  Airway Management Planned: Natural Airway  Additional Equipment:   Intra-op Plan:   Post-operative Plan:   Informed Consent: I have reviewed the patients History and Physical, chart, labs and discussed the procedure including the risks, benefits and alternatives for the proposed anesthesia with the patient or authorized representative who has indicated his/her understanding and acceptance.   Dental Advisory Given  Plan Discussed with:   Anesthesia Plan Comments: (Repeat INR in AM.   Risks of spinal anesthetic discussed in detail secondary to liver disease. Pt would still like to proceed with spinal if labs are amendable.  )      Anesthesia Quick Evaluation

## 2016-09-22 ENCOUNTER — Inpatient Hospital Stay (HOSPITAL_COMMUNITY)
Admission: RE | Admit: 2016-09-22 | Discharge: 2016-09-23 | DRG: 470 | Disposition: A | Discharge status: Home Health | Payer: Medicare HMO | Source: Ambulatory Visit | Attending: Orthopedic Surgery | Admitting: Orthopedic Surgery

## 2016-09-22 ENCOUNTER — Encounter (HOSPITAL_COMMUNITY)
Admission: RE | Disposition: A | Discharge status: Home Health | Payer: Self-pay | Source: Ambulatory Visit | Attending: Orthopedic Surgery

## 2016-09-22 ENCOUNTER — Inpatient Hospital Stay (HOSPITAL_COMMUNITY): Payer: Medicare HMO

## 2016-09-22 ENCOUNTER — Inpatient Hospital Stay (HOSPITAL_COMMUNITY): Payer: Medicare HMO | Admitting: Emergency Medicine

## 2016-09-22 ENCOUNTER — Encounter (HOSPITAL_COMMUNITY): Payer: Self-pay

## 2016-09-22 DIAGNOSIS — I509 Heart failure, unspecified: Secondary | ICD-10-CM | POA: Diagnosis present

## 2016-09-22 DIAGNOSIS — K219 Gastro-esophageal reflux disease without esophagitis: Secondary | ICD-10-CM | POA: Diagnosis present

## 2016-09-22 DIAGNOSIS — D62 Acute posthemorrhagic anemia: Secondary | ICD-10-CM | POA: Diagnosis not present

## 2016-09-22 DIAGNOSIS — K7030 Alcoholic cirrhosis of liver without ascites: Secondary | ICD-10-CM | POA: Diagnosis present

## 2016-09-22 DIAGNOSIS — I110 Hypertensive heart disease with heart failure: Secondary | ICD-10-CM | POA: Diagnosis not present

## 2016-09-22 DIAGNOSIS — E7800 Pure hypercholesterolemia, unspecified: Secondary | ICD-10-CM | POA: Diagnosis present

## 2016-09-22 DIAGNOSIS — M1611 Unilateral primary osteoarthritis, right hip: Principal | ICD-10-CM

## 2016-09-22 DIAGNOSIS — G8929 Other chronic pain: Secondary | ICD-10-CM | POA: Diagnosis not present

## 2016-09-22 DIAGNOSIS — Z419 Encounter for procedure for purposes other than remedying health state, unspecified: Secondary | ICD-10-CM

## 2016-09-22 DIAGNOSIS — M109 Gout, unspecified: Secondary | ICD-10-CM | POA: Diagnosis not present

## 2016-09-22 DIAGNOSIS — R569 Unspecified convulsions: Secondary | ICD-10-CM | POA: Diagnosis present

## 2016-09-22 DIAGNOSIS — D696 Thrombocytopenia, unspecified: Secondary | ICD-10-CM | POA: Diagnosis present

## 2016-09-22 DIAGNOSIS — E559 Vitamin D deficiency, unspecified: Secondary | ICD-10-CM | POA: Diagnosis present

## 2016-09-22 HISTORY — PX: TOTAL HIP ARTHROPLASTY: SHX124

## 2016-09-22 LAB — CBC
HCT: 39.9 % (ref 39.0–52.0)
HCT: 37.6 % — ABNORMAL LOW (ref 39.0–52.0)
Hemoglobin: 14.3 g/dL (ref 13.0–17.0)
Hemoglobin: 13.3 g/dL (ref 13.0–17.0)
MCH: 36.6 pg — AB (ref 26.0–34.0)
MCH: 35.9 pg — ABNORMAL HIGH (ref 26.0–34.0)
MCHC: 35.8 g/dL (ref 30.0–36.0)
MCHC: 35.4 g/dL (ref 30.0–36.0)
MCV: 102 fL — ABNORMAL HIGH (ref 78.0–100.0)
MCV: 101.6 fL — AB (ref 78.0–100.0)
PLATELETS: UNDETERMINED 10*3/uL (ref 150–400)
PLATELETS: 80 10*3/uL — AB (ref 150–400)
RBC: 3.91 MIL/uL — ABNORMAL LOW (ref 4.22–5.81)
RBC: 3.7 MIL/uL — ABNORMAL LOW (ref 4.22–5.81)
RDW: 13.1 % (ref 11.5–15.5)
RDW: 13.1 % (ref 11.5–15.5)
WBC: 5.8 10*3/uL (ref 4.0–10.5)
WBC: 4.6 10*3/uL (ref 4.0–10.5)

## 2016-09-22 LAB — HEPATIC FUNCTION PANEL
ALK PHOS: 72 U/L (ref 38–126)
ALT: 23 U/L (ref 17–63)
AST: 41 U/L (ref 15–41)
Albumin: 3.4 g/dL — ABNORMAL LOW (ref 3.5–5.0)
BILIRUBIN INDIRECT: 2.8 mg/dL — AB (ref 0.3–0.9)
Bilirubin, Direct: 0.6 mg/dL — ABNORMAL HIGH (ref 0.1–0.5)
TOTAL PROTEIN: 6.6 g/dL (ref 6.5–8.1)
Total Bilirubin: 3.4 mg/dL — ABNORMAL HIGH (ref 0.3–1.2)

## 2016-09-22 LAB — APTT: APTT: 39 s — AB (ref 24–36)

## 2016-09-22 LAB — PROTIME-INR
INR: 1.36
PROTHROMBIN TIME: 16.9 s — AB (ref 11.4–15.2)

## 2016-09-22 SURGERY — ARTHROPLASTY, HIP, TOTAL, ANTERIOR APPROACH
Anesthesia: General | Laterality: Right

## 2016-09-22 MED ORDER — PROPOFOL 10 MG/ML IV BOLUS
INTRAVENOUS | Status: AC
  Filled 2016-09-22: qty 20

## 2016-09-22 MED ORDER — ZOLPIDEM TARTRATE 5 MG PO TABS
5.0000 mg | ORAL_TABLET | Freq: Every evening | ORAL | Status: DC | PRN
Start: 2016-09-22 — End: 2016-09-23

## 2016-09-22 MED ORDER — PROPRANOLOL HCL 20 MG PO TABS
10.0000 mg | ORAL_TABLET | Freq: Two times a day (BID) | ORAL | Status: DC
  Administered 2016-09-22 – 2016-09-23 (×2): 10 mg via ORAL
  Filled 2016-09-22 (×2): qty 1

## 2016-09-22 MED ORDER — DOCUSATE SODIUM 100 MG PO CAPS
100.0000 mg | ORAL_CAPSULE | Freq: Two times a day (BID) | ORAL | Status: DC
  Administered 2016-09-22 – 2016-09-23 (×2): 100 mg via ORAL
  Filled 2016-09-22 (×2): qty 1

## 2016-09-22 MED ORDER — MEPERIDINE HCL 25 MG/ML IJ SOLN: 6.2500 mg | INTRAMUSCULAR | Status: DC | PRN

## 2016-09-22 MED ORDER — ONDANSETRON HCL 4 MG/2ML IJ SOLN
INTRAMUSCULAR | Status: AC
  Filled 2016-09-22: qty 4

## 2016-09-22 MED ORDER — FLEET ENEMA 7-19 GM/118ML RE ENEM: 1.0000 | ENEMA | Freq: Once | RECTAL | Status: DC | PRN

## 2016-09-22 MED ORDER — DIPHENHYDRAMINE HCL 12.5 MG/5ML PO ELIX: 12.5000 mg | ORAL_SOLUTION | ORAL | Status: DC | PRN

## 2016-09-22 MED ORDER — MENTHOL 3 MG MT LOZG: 1.0000 | LOZENGE | OROMUCOSAL | Status: DC | PRN

## 2016-09-22 MED ORDER — ONDANSETRON HCL 4 MG/2ML IJ SOLN: 4.0000 mg | Freq: Four times a day (QID) | INTRAMUSCULAR | Status: DC | PRN

## 2016-09-22 MED ORDER — LACTATED RINGERS IV SOLN
INTRAVENOUS | Status: DC
  Administered 2016-09-22: 08:00:00 via INTRAVENOUS

## 2016-09-22 MED ORDER — METHOCARBAMOL 500 MG PO TABS
500.0000 mg | ORAL_TABLET | Freq: Four times a day (QID) | ORAL | Status: DC | PRN
  Administered 2016-09-22 – 2016-09-23 (×3): 500 mg via ORAL
  Filled 2016-09-22 (×4): qty 1

## 2016-09-22 MED ORDER — BUPIVACAINE-EPINEPHRINE (PF) 0.5% -1:200000 IJ SOLN
INTRAMUSCULAR | Status: DC | PRN
  Administered 2016-09-22: 30 mL via PERINEURAL
  Administered 2016-09-22: 20 mL via PERINEURAL

## 2016-09-22 MED ORDER — DEXAMETHASONE SODIUM PHOSPHATE 10 MG/ML IJ SOLN
INTRAMUSCULAR | Status: DC | PRN
  Administered 2016-09-22: 10 mg via INTRAVENOUS

## 2016-09-22 MED ORDER — OXYCODONE HCL 5 MG PO TABS
5.0000 mg | ORAL_TABLET | ORAL | Status: DC | PRN
  Administered 2016-09-22 – 2016-09-23 (×4): 10 mg via ORAL
  Administered 2016-09-23 (×3): 5 mg via ORAL
  Filled 2016-09-22: qty 1
  Filled 2016-09-22: qty 2
  Filled 2016-09-22: qty 1
  Filled 2016-09-22 (×4): qty 2

## 2016-09-22 MED ORDER — BUPIVACAINE-EPINEPHRINE (PF) 0.5% -1:200000 IJ SOLN
INTRAMUSCULAR | Status: AC
  Filled 2016-09-22: qty 60

## 2016-09-22 MED ORDER — LACTULOSE 10 GM/15ML PO SOLN
10.0000 g | Freq: Three times a day (TID) | ORAL | Status: DC
  Administered 2016-09-22: 10 g via ORAL
  Filled 2016-09-22 (×3): qty 15

## 2016-09-22 MED ORDER — ASPIRIN EC 325 MG PO TBEC: 325.0000 mg | DELAYED_RELEASE_TABLET | Freq: Two times a day (BID) | ORAL | 0 refills | Status: DC

## 2016-09-22 MED ORDER — ALBUMIN HUMAN 5 % IV SOLN
INTRAVENOUS | Status: DC | PRN
  Administered 2016-09-22: 10:00:00 via INTRAVENOUS

## 2016-09-22 MED ORDER — CHLORHEXIDINE GLUCONATE 4 % EX LIQD: 60.0000 mL | Freq: Once | CUTANEOUS | Status: DC

## 2016-09-22 MED ORDER — ALUM & MAG HYDROXIDE-SIMETH 200-200-20 MG/5ML PO SUSP: 30.0000 mL | ORAL | Status: DC | PRN

## 2016-09-22 MED ORDER — LISINOPRIL 5 MG PO TABS
5.0000 mg | ORAL_TABLET | Freq: Every day | ORAL | Status: DC
  Administered 2016-09-23: 5 mg via ORAL
  Filled 2016-09-22: qty 1

## 2016-09-22 MED ORDER — METOCLOPRAMIDE HCL 5 MG/ML IJ SOLN: 5.0000 mg | Freq: Three times a day (TID) | INTRAMUSCULAR | Status: DC | PRN

## 2016-09-22 MED ORDER — TIZANIDINE HCL 2 MG PO TABS: 2.0000 mg | ORAL_TABLET | Freq: Four times a day (QID) | ORAL | 0 refills | Status: DC | PRN

## 2016-09-22 MED ORDER — ACETAMINOPHEN 325 MG PO TABS
650.0000 mg | ORAL_TABLET | Freq: Four times a day (QID) | ORAL | Status: DC | PRN
  Administered 2016-09-23: 650 mg via ORAL
  Filled 2016-09-22: qty 2

## 2016-09-22 MED ORDER — HYPROMELLOSE (GONIOSCOPIC) 2.5 % OP SOLN
1.0000 [drp] | Freq: Four times a day (QID) | OPHTHALMIC | Status: DC | PRN
  Filled 2016-09-22: qty 15

## 2016-09-22 MED ORDER — EPHEDRINE 5 MG/ML INJ
INTRAVENOUS | Status: AC
  Filled 2016-09-22: qty 10

## 2016-09-22 MED ORDER — PANTOPRAZOLE SODIUM 40 MG PO TBEC
40.0000 mg | DELAYED_RELEASE_TABLET | Freq: Every day | ORAL | Status: DC
  Administered 2016-09-23: 40 mg via ORAL
  Filled 2016-09-22: qty 1

## 2016-09-22 MED ORDER — LACTATED RINGERS IV SOLN: INTRAVENOUS | Status: DC

## 2016-09-22 MED ORDER — GABAPENTIN 300 MG PO CAPS
300.0000 mg | ORAL_CAPSULE | Freq: Three times a day (TID) | ORAL | Status: DC
  Administered 2016-09-22 – 2016-09-23 (×3): 300 mg via ORAL
  Filled 2016-09-22 (×3): qty 1

## 2016-09-22 MED ORDER — SPIRONOLACTONE 25 MG PO TABS
50.0000 mg | ORAL_TABLET | Freq: Every day | ORAL | Status: DC
  Administered 2016-09-23: 50 mg via ORAL
  Filled 2016-09-22: qty 2

## 2016-09-22 MED ORDER — ACETAMINOPHEN 650 MG RE SUPP: 650.0000 mg | Freq: Four times a day (QID) | RECTAL | Status: DC | PRN

## 2016-09-22 MED ORDER — HYDROMORPHONE HCL 1 MG/ML IJ SOLN: 0.2500 mg | INTRAMUSCULAR | Status: DC | PRN

## 2016-09-22 MED ORDER — FUROSEMIDE 40 MG PO TABS
40.0000 mg | ORAL_TABLET | Freq: Every day | ORAL | Status: DC
  Administered 2016-09-22 – 2016-09-23 (×2): 40 mg via ORAL
  Filled 2016-09-22 (×2): qty 1

## 2016-09-22 MED ORDER — ONDANSETRON HCL 4 MG PO TABS: 4.0000 mg | ORAL_TABLET | Freq: Four times a day (QID) | ORAL | Status: DC | PRN

## 2016-09-22 MED ORDER — SENNOSIDES-DOCUSATE SODIUM 8.6-50 MG PO TABS: 1.0000 | ORAL_TABLET | Freq: Every evening | ORAL | Status: DC | PRN

## 2016-09-22 MED ORDER — KCL IN DEXTROSE-NACL 20-5-0.45 MEQ/L-%-% IV SOLN
INTRAVENOUS | Status: DC
  Administered 2016-09-22: 13:00:00 via INTRAVENOUS

## 2016-09-22 MED ORDER — LACTATED RINGERS IV SOLN
INTRAVENOUS | Status: DC | PRN
  Administered 2016-09-22 (×2): via INTRAVENOUS

## 2016-09-22 MED ORDER — PHENOL 1.4 % MT LIQD: 1.0000 | OROMUCOSAL | Status: DC | PRN

## 2016-09-22 MED ORDER — METOCLOPRAMIDE HCL 5 MG PO TABS: 5.0000 mg | ORAL_TABLET | Freq: Three times a day (TID) | ORAL | Status: DC | PRN

## 2016-09-22 MED ORDER — FENTANYL CITRATE (PF) 250 MCG/5ML IJ SOLN
INTRAMUSCULAR | Status: AC
  Filled 2016-09-22: qty 5

## 2016-09-22 MED ORDER — PHENYLEPHRINE 40 MCG/ML (10ML) SYRINGE FOR IV PUSH (FOR BLOOD PRESSURE SUPPORT)
PREFILLED_SYRINGE | INTRAVENOUS | Status: AC
  Filled 2016-09-22: qty 20

## 2016-09-22 MED ORDER — ASPIRIN EC 325 MG PO TBEC
325.0000 mg | DELAYED_RELEASE_TABLET | Freq: Every day | ORAL | Status: DC
  Administered 2016-09-23: 325 mg via ORAL
  Filled 2016-09-22: qty 1

## 2016-09-22 MED ORDER — BISACODYL 5 MG PO TBEC: 5.0000 mg | DELAYED_RELEASE_TABLET | Freq: Every day | ORAL | Status: DC | PRN

## 2016-09-22 MED ORDER — PROPOFOL 10 MG/ML IV BOLUS
INTRAVENOUS | Status: DC | PRN
  Administered 2016-09-22 (×2): 50 mg via INTRAVENOUS
  Administered 2016-09-22: 200 mg via INTRAVENOUS

## 2016-09-22 MED ORDER — POLYVINYL ALCOHOL 1.4 % OP SOLN
1.0000 [drp] | Freq: Four times a day (QID) | OPHTHALMIC | Status: DC | PRN
  Filled 2016-09-22: qty 15

## 2016-09-22 MED ORDER — MIDAZOLAM HCL 5 MG/5ML IJ SOLN
INTRAMUSCULAR | Status: DC | PRN
  Administered 2016-09-22: 2 mg via INTRAVENOUS

## 2016-09-22 MED ORDER — LIDOCAINE HCL (CARDIAC) 20 MG/ML IV SOLN
INTRAVENOUS | Status: AC
  Filled 2016-09-22: qty 15

## 2016-09-22 MED ORDER — PROPRANOLOL HCL 20 MG PO TABS
10.0000 mg | ORAL_TABLET | Freq: Once | ORAL | Status: DC
  Filled 2016-09-22: qty 1

## 2016-09-22 MED ORDER — ONDANSETRON HCL 4 MG/2ML IJ SOLN
INTRAMUSCULAR | Status: DC | PRN
  Administered 2016-09-22: 4 mg via INTRAVENOUS

## 2016-09-22 MED ORDER — ROCURONIUM BROMIDE 10 MG/ML (PF) SYRINGE
PREFILLED_SYRINGE | INTRAVENOUS | Status: AC
  Filled 2016-09-22: qty 5

## 2016-09-22 MED ORDER — PHENYLEPHRINE HCL 10 MG/ML IJ SOLN
INTRAMUSCULAR | Status: DC | PRN
  Administered 2016-09-22 (×2): 80 ug via INTRAVENOUS

## 2016-09-22 MED ORDER — HYDROMORPHONE HCL 1 MG/ML IJ SOLN
0.5000 mg | INTRAMUSCULAR | Status: DC | PRN
  Administered 2016-09-22: 0.5 mg via INTRAVENOUS
  Filled 2016-09-22: qty 1

## 2016-09-22 MED ORDER — DEXAMETHASONE SODIUM PHOSPHATE 10 MG/ML IJ SOLN
10.0000 mg | Freq: Once | INTRAMUSCULAR | Status: AC
  Administered 2016-09-23: 10 mg via INTRAVENOUS
  Filled 2016-09-22: qty 1

## 2016-09-22 MED ORDER — PROMETHAZINE HCL 25 MG/ML IJ SOLN: 6.2500 mg | INTRAMUSCULAR | Status: DC | PRN

## 2016-09-22 MED ORDER — OXYCODONE-ACETAMINOPHEN 5-325 MG PO TABS: 1.0000 | ORAL_TABLET | ORAL | 0 refills | Status: DC | PRN

## 2016-09-22 MED ORDER — FENTANYL CITRATE (PF) 100 MCG/2ML IJ SOLN
INTRAMUSCULAR | Status: DC | PRN
  Administered 2016-09-22: 25 ug via INTRAVENOUS
  Administered 2016-09-22: 50 ug via INTRAVENOUS
  Administered 2016-09-22: 75 ug via INTRAVENOUS
  Administered 2016-09-22 (×3): 50 ug via INTRAVENOUS

## 2016-09-22 MED ORDER — MIDAZOLAM HCL 2 MG/2ML IJ SOLN
INTRAMUSCULAR | Status: AC
  Filled 2016-09-22: qty 2

## 2016-09-22 MED ORDER — METHOCARBAMOL 1000 MG/10ML IJ SOLN
500.0000 mg | Freq: Four times a day (QID) | INTRAVENOUS | Status: DC | PRN
  Filled 2016-09-22: qty 5

## 2016-09-22 MED ORDER — FOLIC ACID 1 MG PO TABS
1.0000 mg | ORAL_TABLET | Freq: Every day | ORAL | Status: DC
  Administered 2016-09-22 – 2016-09-23 (×2): 1 mg via ORAL
  Filled 2016-09-22 (×2): qty 1

## 2016-09-22 MED ORDER — 0.9 % SODIUM CHLORIDE (POUR BTL) OPTIME
TOPICAL | Status: DC | PRN
  Administered 2016-09-22: 1000 mL

## 2016-09-22 SURGICAL SUPPLY — 43 items
BAG DECANTER FOR FLEXI CONT (MISCELLANEOUS) ×2 IMPLANT
BLADE SAW SGTL 18X1.27X75 (BLADE) ×2 IMPLANT
CAPT HIP TOTAL 2 ×1 IMPLANT
COVER PERINEAL POST (MISCELLANEOUS) ×2 IMPLANT
COVER SURGICAL LIGHT HANDLE (MISCELLANEOUS) ×2 IMPLANT
DRAPE C-ARM 42X72 X-RAY (DRAPES) ×2 IMPLANT
DRAPE STERI IOBAN 125X83 (DRAPES) ×2 IMPLANT
DRAPE U-SHAPE 47X51 STRL (DRAPES) ×4 IMPLANT
DRSG AQUACEL AG ADV 3.5X10 (GAUZE/BANDAGES/DRESSINGS) ×2 IMPLANT
DURAPREP 26ML APPLICATOR (WOUND CARE) ×2 IMPLANT
ELECT BLADE 4.0 EZ CLEAN MEGAD (MISCELLANEOUS) ×2
ELECT REM PT RETURN 9FT ADLT (ELECTROSURGICAL) ×2
ELECTRODE BLDE 4.0 EZ CLN MEGD (MISCELLANEOUS) ×1 IMPLANT
ELECTRODE REM PT RTRN 9FT ADLT (ELECTROSURGICAL) ×1 IMPLANT
FACESHIELD WRAPAROUND (MASK) ×4 IMPLANT
FACESHIELD WRAPAROUND OR TEAM (MASK) ×2 IMPLANT
GLOVE BIO SURGEON STRL SZ7.5 (GLOVE) ×2 IMPLANT
GLOVE BIO SURGEON STRL SZ8.5 (GLOVE) ×2 IMPLANT
GLOVE BIOGEL PI IND STRL 8 (GLOVE) ×1 IMPLANT
GLOVE BIOGEL PI IND STRL 9 (GLOVE) ×1 IMPLANT
GLOVE BIOGEL PI INDICATOR 8 (GLOVE) ×1
GLOVE BIOGEL PI INDICATOR 9 (GLOVE) ×1
GOWN STRL REUS W/ TWL LRG LVL3 (GOWN DISPOSABLE) ×1 IMPLANT
GOWN STRL REUS W/ TWL XL LVL3 (GOWN DISPOSABLE) ×2 IMPLANT
GOWN STRL REUS W/TWL LRG LVL3 (GOWN DISPOSABLE) ×2
GOWN STRL REUS W/TWL XL LVL3 (GOWN DISPOSABLE) ×4
KIT BASIN OR (CUSTOM PROCEDURE TRAY) ×2 IMPLANT
KIT ROOM TURNOVER OR (KITS) ×2 IMPLANT
MANIFOLD NEPTUNE II (INSTRUMENTS) ×2 IMPLANT
NEEDLE HYPO 22GX1.5 SAFETY (NEEDLE) ×4 IMPLANT
NS IRRIG 1000ML POUR BTL (IV SOLUTION) ×2 IMPLANT
PACK TOTAL JOINT (CUSTOM PROCEDURE TRAY) ×2 IMPLANT
PAD ARMBOARD 7.5X6 YLW CONV (MISCELLANEOUS) ×4 IMPLANT
SUT VIC AB 1 CTX 36 (SUTURE) ×2
SUT VIC AB 1 CTX36XBRD ANBCTR (SUTURE) ×1 IMPLANT
SUT VIC AB 2-0 CT1 27 (SUTURE) ×2
SUT VIC AB 2-0 CT1 TAPERPNT 27 (SUTURE) ×1 IMPLANT
SUT VIC AB 3-0 PS2 18 (SUTURE) ×2
SUT VIC AB 3-0 PS2 18XBRD (SUTURE) ×1 IMPLANT
SYR CONTROL 10ML LL (SYRINGE) ×4 IMPLANT
TOWEL OR 17X24 6PK STRL BLUE (TOWEL DISPOSABLE) ×2 IMPLANT
TOWEL OR 17X26 10 PK STRL BLUE (TOWEL DISPOSABLE) ×2 IMPLANT
TRAY CATH 16FR W/PLASTIC CATH (SET/KITS/TRAYS/PACK) IMPLANT

## 2016-09-22 NOTE — Transfer of Care (Signed)
Immediate Anesthesia Transfer of Care Note  Patient: Gary [name]  Procedure(s) Performed: Procedure(s): TOTAL HIP ARTHROPLASTY ANTERIOR APPROACH (Right)  Patient Location: PACU  Anesthesia Type:General  Level of Consciousness: awake, alert  and oriented  Airway & Oxygen Therapy: Patient Spontanous Breathing and Patient connected to face mask oxygen  Post-op Assessment: Report given to RN and Post -op Vital signs reviewed and stable  Post vital signs: Reviewed and stable  Last Vitals:  Vitals:   09/22/16 0705 09/22/16 1109  BP: 128/87 111/66  Pulse: (!) 114 72  Resp: 18 15  Temp: 36.8 C (!) 36.4 C    Last Pain:  Vitals:   09/22/16 1109  TempSrc:   PainSc: (P) 0-No pain         Complications: No apparent anesthesia complications

## 2016-09-22 NOTE — Evaluation (Signed)
Physical Therapy Evaluation Patient Details Name: Gary [name] MRN: [id number] DOB: [birth year] Today's Date: 09/22/2016   History of Present Illness  Pt is a 66 y/o male s/p elective R THA. PMH incldues alcohol abuse, alcoholic hepatitis with ascites, gout, CHF, seizures, HTN, and thrombocytopenia.   Clinical Impression  Pt is s/p surgery above with deficits below. PTA, pt was using cane for ambulation secondary to pain in R hip. Upon eval, pt limited by post op pain and weakness, as well as, decreased balance. Pt requiring min guard for mobility this session. Reports wife will be able to assist at home and already has DME. Follow up recommendations per MD arrangement. Will continue to follow acutely to progress mobility and increase independence and safety with mobility.     Follow Up Recommendations DC plan and follow up therapy as arranged by surgeon;Supervision/Assistance - 24 hour    Equipment Recommendations  None recommended by PT (DME already delivered)    Recommendations for Other Services       Precautions / Restrictions Precautions Precautions: None Precaution Comments: Reviewed THA handout with pt.  Restrictions Weight Bearing Restrictions: Yes RLE Weight Bearing: Weight bearing as tolerated      Mobility  Bed Mobility Overal bed mobility: Needs Assistance Bed Mobility: Supine to Sit     Supine to sit: Min guard     General bed mobility comments: Min guard for safety. Required elevated HOB and use of bed rails.   Transfers Overall transfer level: Needs assistance Equipment used: Rolling walker (2 wheeled) Transfers: Sit to/from Stand Sit to Stand: Min guard         General transfer comment: Min guard for safety. Verbal cues for safe hand placement.   Ambulation/Gait Ambulation/Gait assistance: Min guard Ambulation Distance (Feet): 15 Feet Assistive device: Rolling walker (2 wheeled) Gait Pattern/deviations: Step-to pattern;Decreased step length -  right;Decreased step length - left;Decreased weight shift to right;Antalgic;Trunk flexed Gait velocity: Decreased Gait velocity interpretation: Below normal speed for age/gender General Gait Details: Slow, antalgic gait secondary to post op pain and weakness. Tremor noted during gait, however, no LOB noted. Required verbal cues for sequencing with use of RW and cues for proximity to device and upright posture. Distance limited secondary to pain.   Stairs            Wheelchair Mobility    Modified Rankin (Stroke Patients Only)       Balance Overall balance assessment: Needs assistance Sitting-balance support: No upper extremity supported;Feet supported Sitting balance-Leahy Scale: Good     Standing balance support: Bilateral upper extremity supported;During functional activity Standing balance-Leahy Scale: Poor Standing balance comment: Reliant on RW for stability.                              Pertinent Vitals/Pain Pain Assessment: 0-10 Pain Score: 9  Pain Location: R hip  Pain Descriptors / Indicators: Aching;Constant;Operative site guarding;Sore Pain Intervention(s): Limited activity within patient's tolerance;Monitored during session;Repositioned;Premedicated before session    Home Living Family/patient expects to be discharged to:: Private residence Living Arrangements: Spouse/significant other Available Help at Discharge: Family;Available 24 hours/day Type of Home: Other(Comment) (townhouse ) Home Access: Stairs to enter Entrance Stairs-Rails: None Entrance Stairs-Number of Steps: 1 (sidewalk step ) Home Layout: One level Home Equipment: Walker - 2 wheels;Cane - single point;Bedside commode      Prior Function Level of Independence: Independent with assistive device(s)  Comments: Used cane at baseline      Hand Dominance   Dominant Hand: Right    Extremity/Trunk Assessment   Upper Extremity Assessment Upper Extremity Assessment:  Defer to OT evaluation    Lower Extremity Assessment Lower Extremity Assessment: RLE deficits/detail RLE Deficits / Details: Sensory in tact. Deficits consistent with post op pain and weakness. Able to perform exercises below.     Cervical / Trunk Assessment Cervical / Trunk Assessment: Normal  Communication   Communication: No difficulties  Cognition Arousal/Alertness: Awake/alert Behavior During Therapy: WFL for tasks assessed/performed Overall Cognitive Status: Within Functional Limits for tasks assessed                                        General Comments General comments (skin integrity, edema, etc.): Pt's wife present during session.     Exercises Total Joint Exercises Ankle Circles/Pumps: AROM;Both;10 reps;Supine Quad Sets: AROM;Right;10 reps;Supine Short Arc Quad: AROM;Right;10 reps;Supine Heel Slides: AROM;Right;10 reps;Supine Hip ABduction/ADduction: AROM;Right;10 reps;Supine   Assessment/Plan    PT Assessment Patient needs continued PT services  PT Problem List Decreased strength;Decreased range of motion;Decreased activity tolerance;Decreased balance;Decreased mobility;Decreased knowledge of use of DME;Decreased knowledge of precautions;Pain       PT Treatment Interventions DME instruction;Gait training;Stair training;Functional mobility training;Therapeutic activities;Therapeutic exercise;Balance training;Neuromuscular re-education;Patient/family education    PT Goals (Current goals can be found in the Care Plan section)  Acute Rehab PT Goals Patient Stated Goal: to go home  PT Goal Formulation: With patient Time For Goal Achievement: 09/29/16 Potential to Achieve Goals: Good    Frequency 7X/week   Barriers to discharge        Co-evaluation               AM-PAC PT "6 Clicks" Daily Activity  Outcome Measure Difficulty turning over in bed (including adjusting bedclothes, sheets and blankets)?: A Little Difficulty moving from  lying on back to sitting on the side of the bed? : A Little Difficulty sitting down on and standing up from a chair with arms (e.g., wheelchair, bedside commode, etc,.)?: Total Help needed moving to and from a bed to chair (including a wheelchair)?: A Little Help needed walking in hospital room?: A Little Help needed climbing 3-5 steps with a railing? : A Lot 6 Click Score: 15    End of Session Equipment Utilized During Treatment: Gait belt Activity Tolerance: Patient tolerated treatment well Patient left: in chair;with call bell/phone within reach;with family/visitor present Nurse Communication: Mobility status PT Visit Diagnosis: Other abnormalities of gait and mobility (R26.89);Pain Pain - Right/Left: Right Pain - part of body: Hip    Time: 1721-1747 PT Time Calculation (min) (ACUTE ONLY): 26 min   Charges:   PT Evaluation $PT Eval Low Complexity: 1 Procedure PT Treatments $Gait Training: 8-22 mins   PT G Codes:        Gladys Damme, PT, DPT  Acute Rehabilitation Services  Pager: [phone]   Gary [name] 09/22/2016, 6:25 PM

## 2016-09-22 NOTE — Anesthesia Procedure Notes (Signed)
Procedure Name: LMA Insertion Date/Time: 09/22/2016 9:07 AM Performed by: Izola Price Pre-anesthesia Checklist: Timeout performed, Patient being monitored, Suction available, Emergency Drugs available and Patient identified Patient Re-evaluated:Patient Re-evaluated prior to induction Oxygen Delivery Method: Circle system utilized Preoxygenation: Pre-oxygenation with 100% oxygen Induction Type: IV induction LMA: LMA inserted LMA Size: 5.0 Tube type: Oral Number of attempts: 1 Placement Confirmation: breath sounds checked- equal and bilateral and positive ETCO2 Tube secured with: Tape Dental Injury: Teeth and Oropharynx as per pre-operative assessment

## 2016-09-22 NOTE — Interval H&P Note (Signed)
History and Physical Interval Note:  09/22/2016 7:13 AM  Gary [name]  has presented today for surgery, with the diagnosis of RIGHT HIP DEGENERATIVE JOINT DISEASE  The various methods of treatment have been discussed with the patient and family. After consideration of risks, benefits and other options for treatment, the patient has consented to  Procedure(s): TOTAL HIP ARTHROPLASTY ANTERIOR APPROACH (Right) as a surgical intervention .  The patient's history has been reviewed, patient examined, no change in status, stable for surgery.  I have reviewed the patient's chart and labs.  Questions were answered to the patient's satisfaction.     [name]

## 2016-09-22 NOTE — Op Note (Signed)
OPERATIVE REPORT    DATE OF PROCEDURE:  09/22/2016       PREOPERATIVE DIAGNOSIS:  RIGHT HIP DEGENERATIVE JOINT DISEASE                                                          POSTOPERATIVE DIAGNOSIS:  RIGHT HIP DEGENERATIVE JOINT DISEASE                                                           PROCEDURE: Anterior R total hip arthroplasty using a 52 mm DePuy Pinnacle  Cup, [employer], 0-degree polyethylene liner, a +1.5 36 mm ceramic head, a 7 hi Depuy Triloc stem   SURGEON: PNPYY,FRTMY J    ASSISTANT:   Eric K. [employer]  (present throughout entire procedure and necessary for timely completion of the procedure)   ANESTHESIA: GET BLOOD LOSS: 600 FLUID REPLACEMENT: 1800 crystalloid Antibiotic: 2gm Ancef Tranexamic Acid: 1gm iv, 2gm topical COMPLICATIONS: none    INDICATIONS FOR PROCEDURE: A 66 y.o. year-old With  RIGHT HIP DEGENERATIVE JOINT DISEASE   for 3 years, x-rays show bone-on-bone arthritic changes, and osteophytes. Despite conservative measures with observation, anti-inflammatory medicine, narcotics, use of a cane, has severe unremitting pain and can ambulate only a few blocks before resting. Patient desires elective R total hip arthroplasty to decrease pain and increase function. The risks, benefits, and alternatives were discussed at length including but not limited to the risks of infection, bleeding, nerve injury, stiffness, blood clots, the need for revision surgery, cardiopulmonary complications, among others, and they were willing to proceed. Questions answered     PROCEDURE IN DETAIL: The patient was identified by armband,  received preoperative IV antibiotics in the holding area at [facility], taken to the operating room , appropriate anesthetic monitors  were attached and  anesthesia was induced with the patienton the gurney. The HANA boots were applied to the feet and he was then transferred to the HANA table with a peroneal post and  support underneath the non-operative le, which was locked in 5 lb traction. Theoperative lower extremity was then prepped and draped in the usual sterile fashion from just above the iliac crest to the knee. And a timeout procedure was performed. We then made a 14 cm incision along the interval at the leading edge of the tensor fascia lata of starting at 2 cm lateral to and 2 cm distal to the ASIS. Small bleeders in the skin and subcutaneous tissue identified and cauterized we dissected down to the fascia and made an incision in the fascia allowing [place] to elevate the fascia of the tensor muscle and exploited the interval between the rectus and the tensor fascia lata. A Hohmann retractor was then placed along the superior neck of the femur and a Cobra retractor along the inferior neck of the femur we teed the capsule starting out at the superior anterior aspect of the acetabulum going distally and made the T along the neck both leaflets of the T were tagged with #2 Ethibond suture. Cobra retractors were then placed along the inferior and superior neck allowing [place] to  perform a standard neck cut and removed the femoral head with a power corkscrew. We then placed a right angle Hohmann retractor along the anterior aspect of the acetabulum a spiked Cobra in the cotyloid notch and posteriorly a Muelller retractor. We then sequentially reamed up to a 51 mm basket reamer obtaining good coverage in all quadrants, verified by C-arm imaging. Under C-arm control with and hammered into place a 52 mm Pinnacle cup in 45 of abduction and 15 of anteversion. The cup seated nicely and required no supplemental screws. We then placed a central hole Eliminator and a 0 polyethylene liner. The foot was then externally rotated to 110, the HANA elevator was placed around the flare of the greater trochanter and the limb was extended and abducted delivering the proximal femur up into the wound. A medium Hohmann retractor was placed over the  greater trochanter and a Mueller retractor along the posterior femoral neck completing the exposure. We then performed releases superiorly and and inferiorly of the capsule going back to the pirformis fossa superiorly and to the lesser trochanter inferiorly. We then entered the proximal femur with the box cutting offset chisel followed by, a canal sounder, the chili pepper and broaching up to a 7 broach. This seated nicely and we reamed the calcar. A trial reduction was performed with a 1.5 mm 36 mm head.The limb lengths were excellent the hip was stable in 90 of external rotation. At this point the trial components removed and we hammered into place a # 7 hi Tri-Lock stem with [employer] coating. This was a hi offset stem and a + 1.5 36 mm ceramic ball was then hammered into place the hip was reduced and final C-arm images obtained. The wound was thoroughly irrigated with normal saline solution. We repaired the ant capsule and the tensor fascia lot a with running 0 vicryl suture. the subcutaneous tissue was closed with 2-0 and 3-0 Vicryl suture followed by an Aquacil dressing. At this point the patient was awaken and transferred to hospital gurney without difficulty. The subcutaneous tissue with 0 and 2-0 undyed Vicryl suture and the skin with running  3-0 vicryl subcuticular suture. Aquacil dressing was applied. The patient was then unclamped, rolled supine, awaken extubated and taken to recovery room without difficulty in stable condition.   Onedia Vargus J 09/22/2016, 10:35 AM

## 2016-09-23 ENCOUNTER — Encounter (HOSPITAL_COMMUNITY): Payer: Self-pay | Admitting: Orthopedic Surgery

## 2016-09-23 LAB — CBC
HCT: 31.1 % — ABNORMAL LOW (ref 39.0–52.0)
Hemoglobin: 10.7 g/dL — ABNORMAL LOW (ref 13.0–17.0)
MCH: 35.2 pg — AB (ref 26.0–34.0)
MCHC: 34.4 g/dL (ref 30.0–36.0)
MCV: 102.3 fL — ABNORMAL HIGH (ref 78.0–100.0)
Platelets: 80 10*3/uL — ABNORMAL LOW (ref 150–400)
RBC: 3.04 MIL/uL — ABNORMAL LOW (ref 4.22–5.81)
RDW: 12.9 % (ref 11.5–15.5)
WBC: 11.7 10*3/uL — ABNORMAL HIGH (ref 4.0–10.5)

## 2016-09-23 NOTE — Discharge Summary (Signed)
Patient ID: Gary [name] MRN: [id number] DOB/AGE: [birth year] 66 y.o.  Admit date: 09/22/2016 Discharge date: 09/23/2016  Admission Diagnoses:  Active Problems:   Arthritis of right hip   Primary localized osteoarthritis of right hip   Discharge Diagnoses:  Same  Past Medical History:  Diagnosis Date  . Abnormal echocardiography   . Acute encephalopathy   . Acute hepatitis    cirrohois  . Alcohol abuse   . Alcohol dependence (HCC) 02/28/2014  . Alcoholic cirrhosis (HCC)    Overview:  Hx of encephalopathy  . Alcoholic cirrhosis of liver without ascites (HCC)   . Alcoholic encephalopathy (HCC) 06/09/8117  . Alcoholic hepatitis    Overview:  Last Assessment & Plan:  SNF - pt started on aldactone and propranolol; cont same  . Alcoholic hepatitis without ascites 02/25/2014  . Altered mental status 06/15/2015  . Arthritis    + gout, also OA in hip  . Arthritis of right hip 09/12/2016  . Ascites 04/25/2014  . CHF (congestive heart failure) (HCC)   . Chronic pain of right hip 11/09/2015  . Elevated CK 06/15/2015  . Elevated LFTs 11/21/2014  . Elevated liver enzymes   . Elevated troponin 06/15/2015  . Esophageal reflux   . Essential hypertension 11/10/2014   Overview:  Was on lisinopril 20 mg daily per records  . Fall 06/15/2015  . GERD (gastroesophageal reflux disease)   . Hepatic cirrhosis (HCC) 06/15/2015  . History of alcohol abuse 04/25/2014  . History of hiatal hernia   . Hyperbilirubinemia 03/01/2014   Overview:  Last Assessment & Plan:  Likely due to liver cirrhosis, ascites has macrocytic anemia, thrombocytopenia, hepatic encephalopathy/hyperammonemia - CT abdomen showed hepatic cirrhosis with degenerative nodules - Ultrasound showed severe hepatic steatosis, trace amount of perisplenic ascites - on steroids for acute alcoholic hepatitis, patient has GI appointment for further steroid taper outpatient , continue current dose for 28 days. - AFP 2.4 - Placed on propranolol,  Aldactone 50 mg daily. Follow potassium level. SNF - cont propranolol and aldactone; f/u CMP for K and bilirubin  . Hypercholesterolemia   . Hypertension   . Hypokalemia   . Hypomagnesemia   . Hyponatremia   . Increased liver enzymes   . Inflammatory liver disease 11/21/2014  . Left ankle pain 04/25/2014  . Lower extremity edema 04/25/2014  . Macrocytic anemia   . Maxillary sinus fracture (HCC) 04/05/2015  . Osteoarthritis of right hip 11/10/2015  . Polyarthritis of multiple sites   . Pure hypercholesterolemia 10/19/2014   Overview:  LDL 206 per records 03/2014. Not on statin due to liver disease  . Rash 04/25/2014  . Seizures (HCC)    pt. denies   . Shortness of breath 04/25/2014  . Thrombocytopenia (HCC)   . Tremors of nervous system   . Vitamin D deficiency     Surgeries: Procedure(s): TOTAL HIP ARTHROPLASTY ANTERIOR APPROACH on 09/22/2016   Consultants:   Discharged Condition: Improved  Hospital Course: DESMON HITCHNER is an 66 y.o. male who was admitted 09/22/2016 for operative treatment of<principal problem not specified>. Patient has severe unremitting pain that affects sleep, daily activities, and work/hobbies. After pre-op clearance the patient was taken to the operating room on 09/22/2016 and underwent  Procedure(s): TOTAL HIP ARTHROPLASTY ANTERIOR APPROACH.    Patient was given perioperative antibiotics: Anti-infectives    Start     Dose/Rate Route Frequency Ordered Stop   09/22/16 0830  ceFAZolin (ANCEF) IVPB 2g/100 mL premix     2 g 200 mL/hr  over 30 Minutes Intravenous To [facility] 09/19/16 0805 09/22/16 0905       Patient was given sequential compression devices, early ambulation, and chemoprophylaxis to prevent DVT.  Patient benefited maximally from hospital stay and there were no complications.    Recent vital signs: Patient Vitals for the past 24 hrs:  BP Temp Temp src Pulse Resp SpO2  09/23/16 1348 (!) 98/59 98.9 F (37.2 C) Oral 71 18 95 %  09/23/16 0544  112/67 98.2 F (36.8 C) Tympanic 98 18 96 %  09/23/16 0057 109/69 97.9 F (36.6 C) Oral 73 16 94 %  09/22/16 2159 112/76 98.1 F (36.7 C) Oral 77 16 90 %  09/22/16 1549 106/73 98.5 F (36.9 C) Oral 76 18 91 %     Recent laboratory studies:  Recent Labs  09/22/16 0655 09/22/16 0743 09/23/16 0406  WBC 5.8 4.6 11.7*  HGB 14.3 13.3 10.7*  HCT 39.9 37.6* 31.1*  PLT PLATELET CLUMPS NOTED ON SMEAR, UNABLE TO ESTIMATE 80* 80*  INR 1.36  --   --      Discharge Medications:   Allergies as of 09/23/2016      Reactions   No Known Allergies       Medication List    STOP taking these medications   traMADol 50 MG tablet Commonly known as:  ULTRAM     TAKE these medications   aspirin EC 325 MG tablet Take 1 tablet (325 mg total) by mouth 2 (two) times daily. What changed:  medication strength  how much to take  when to take this   Fish Oil 500 MG Caps Take 500 mg by mouth daily.   folic acid 1 MG tablet Commonly known as:  FOLVITE Take 1 mg by mouth daily.   furosemide 40 MG tablet Commonly known as:  LASIX Take 40 mg by mouth daily.   hydroxypropyl methylcellulose / hypromellose 2.5 % ophthalmic solution Commonly known as:  ISOPTO TEARS / GONIOVISC Place 1 drop into both eyes 4 (four) times daily as needed for dry eyes.   lactulose 10 GM/15ML solution Commonly known as:  CHRONULAC Take 45 mLs (30 g total) by mouth 3 (three) times daily. What changed:  how much to take   lisinopril 5 MG tablet Commonly known as:  PRINIVIL,ZESTRIL Take 5 mg by mouth daily.   multivitamin with minerals Tabs tablet Take 1 tablet by mouth daily.   oxyCODONE-acetaminophen 5-325 MG tablet Commonly known as:  ROXICET Take 1 tablet by mouth every 4 (four) hours as needed.   pantoprazole 40 MG tablet Commonly known as:  PROTONIX Take 1 tablet (40 mg total) by mouth daily. What changed:  when to take this   propranolol 10 MG tablet Commonly known as:  INDERAL Take 1 tablet  (10 mg total) by mouth 2 (two) times daily.   spironolactone 50 MG tablet Commonly known as:  ALDACTONE Take 1 tablet (50 mg total) by mouth daily.   tiZANidine 2 MG tablet Commonly known as:  ZANAFLEX Take 1 tablet (2 mg total) by mouth every 6 (six) hours as needed for muscle spasms.   Vitamin D (Ergocalciferol) 50000 units Caps capsule Commonly known as:  DRISDOL Take 50,000 Units by mouth every 7 (seven) days.            Durable Medical Equipment        Start     Ordered   09/22/16 1239  DME Walker rolling  Once    Question:  Patient needs a walker to treat with the following condition  Answer:  Primary localized osteoarthritis of right hip   09/22/16 1238   09/22/16 1239  DME 3 n 1  Once     09/22/16 1238   09/22/16 1239  DME Bedside commode  Once    Question:  Patient needs a bedside commode to treat with the following condition  Answer:  Primary localized osteoarthritis of right hip   09/22/16 1238      Diagnostic Studies: Dg Chest 2 View  Result Date: 09/11/2016 CLINICAL DATA:  Preoperative respiratory evaluation prior to right total hip arthroplasty. EXAM: CHEST  2 VIEW COMPARISON:  Chest x-ray 04/05/2015 and earlier. CT chest 06/15/2015 and earlier. FINDINGS: Cardiac silhouette normal in size, unchanged. Thoracic aorta mildly atherosclerotic, unchanged. Hilar and mediastinal contours otherwise unremarkable. Lungs clear. Bronchovascular markings normal. Pulmonary vascularity normal. No visible pleural effusions. No pneumothorax. Degenerative changes involving thoracic spine. Remote healed right posterior rib fractures. IMPRESSION: 1.  No acute cardiopulmonary disease. 2. Thoracic aortic atherosclerosis. Electronically Signed   By: Hulan Saas M.D.   On: 09/11/2016 14:28   Dg C-arm 1-60 Min  Result Date: 09/22/2016 CLINICAL DATA:  Anterior right total hip replacement EXAM: OPERATIVE RIGHT HIP (WITH PELVIS IF PERFORMED) 3 VIEWS TECHNIQUE: Fluoroscopic spot  image(s) were submitted for interpretation post-operatively. COMPARISON:  None. FINDINGS: Intraoperative imaging demonstrates changes of right hip replacement. No visible hardware or bony complicating feature. IMPRESSION: Right hip replacement.  No complicating feature. Electronically Signed   By: Charlett Nose M.D.   On: 09/22/2016 10:37   Dg Hip Operative Unilat With Pelvis Right  Result Date: 09/22/2016 CLINICAL DATA:  Anterior right total hip replacement EXAM: OPERATIVE RIGHT HIP (WITH PELVIS IF PERFORMED) 3 VIEWS TECHNIQUE: Fluoroscopic spot image(s) were submitted for interpretation post-operatively. COMPARISON:  None. FINDINGS: Intraoperative imaging demonstrates changes of right hip replacement. No visible hardware or bony complicating feature. IMPRESSION: Right hip replacement.  No complicating feature. Electronically Signed   By: Charlett Nose M.D.   On: 09/22/2016 10:37    Disposition: 03-Skilled Nursing Facility  Discharge Instructions    Call MD / Call 911    Complete by:  As directed    If you experience chest pain or shortness of breath, CALL 911 and be transported to the hospital emergency room.  If you develope a fever above 101 F, pus (white drainage) or increased drainage or redness at the wound, or calf pain, call your surgeon's office.   Constipation Prevention    Complete by:  As directed    Drink plenty of fluids.  Prune juice may be helpful.  You may use a stool softener, such as Colace (over the counter) 100 mg twice a day.  Use MiraLax (over the counter) for constipation as needed.   Diet - low sodium heart healthy    Complete by:  As directed    Driving restrictions    Complete by:  As directed    No driving for 2 weeks   Follow the hip precautions as taught in Physical Therapy    Complete by:  As directed    Increase activity slowly as tolerated    Complete by:  As directed    Patient may shower    Complete by:  As directed    You may shower without a dressing  once there is no drainage.  Do not wash over the wound.  If drainage remains, cover wound with plastic wrap and then shower.  Follow-up Information    Gean Birchwood, MD Follow up in 2 week(s).   Specialty:  Orthopedic Surgery Contact information: [address] [place] [address] [phone]        [facility] Follow up.   Specialty:  Home Health Services Why:  A representative from [facility] will contact you to arrange start date and time for your therapy. Contact information: [address] [place] [address] [phone]            Signed: Vear Clock Ruble Buttler R 09/23/2016, 2:34 PM

## 2016-09-23 NOTE — Progress Notes (Signed)
Discharged patient with family member. Patient A&Ox4, discharged paper work reviewed and signed

## 2016-09-23 NOTE — Progress Notes (Signed)
Physical Therapy Treatment Patient Details Name: Gary [name] MRN: [id number] DOB: 10-31-1950 Today's Date: 09/23/2016    History of Present Illness Pt is a 66 y/o male s/p elective R THA. PMH incldues alcohol abuse, alcoholic hepatitis with ascites, gout, CHF, seizures, HTN, and thrombocytopenia.     PT Comments    Patient is progressing toward mobility goals. Pt able to ambulate >170ft with min guard/supervision assist and able to negotiate steps with min guard/min A. Reviewed HEP, activity progression, and use of ice. Continue to progress as tolerated.    Follow Up Recommendations  DC plan and follow up therapy as arranged by surgeon;Supervision/Assistance - 24 hour     Equipment Recommendations  None recommended by PT (DME already delivered)    Recommendations for Other Services       Precautions / Restrictions Precautions Precautions: None Precaution Comments: Reviewed with Pt Restrictions Weight Bearing Restrictions: Yes RLE Weight Bearing: Weight bearing as tolerated    Mobility  Bed Mobility Overal bed mobility: Modified Independent Bed Mobility: Supine to Sit     Supine to sit: Min guard     General bed mobility comments: increased time and effort; no use of rails and HOB flat  Transfers Overall transfer level: Needs assistance Equipment used: Rolling walker (2 wheeled) Transfers: Sit to/from Stand Sit to Stand: Supervision         General transfer comment: supervision for safety; carry over of safe hand placement  Ambulation/Gait Ambulation/Gait assistance: Min guard;Supervision Ambulation Distance (Feet): 180 Feet Assistive device: Rolling walker (2 wheeled) Gait Pattern/deviations: Step-to pattern;Decreased weight shift to right;Antalgic;Trunk flexed;Decreased step length - left;Decreased step length - right Gait velocity: Decreased   General Gait Details: cues for posture and R heel strike; mildly antalgic gait   Stairs Stairs: Yes   Stair  Management: No rails;Step to pattern;Forwards;With cane Number of Stairs:  (2 steps X2) General stair comments: cues for sequencing and technique; pt ascended with [facility] and min guard assist and descended with [facility] and [facility]; wife and pt reported wife assisted with steps PTA  Wheelchair Mobility    Modified Rankin (Stroke Patients Only)       Balance Overall balance assessment: Needs assistance Sitting-balance support: No upper extremity supported;Feet supported Sitting balance-Leahy Scale: Good     Standing balance support: During functional activity;Single extremity supported Standing balance-Leahy Scale: Poor Standing balance comment: pt reliant on UE support                            Cognition Arousal/Alertness: Awake/alert Behavior During Therapy: WFL for tasks assessed/performed Overall Cognitive Status: Within Functional Limits for tasks assessed                                 General Comments: pt is slow to process; suspect this is baseline      Exercises Total Joint Exercises Heel Slides: AROM;Right;10 reps;Supine Hip ABduction/ADduction: AROM;Right;10 reps;Supine    General Comments General comments (skin integrity, edema, etc.): wife present; pt with noted tremors however did not seem to impair mobility      Pertinent Vitals/Pain Pain Assessment: Faces Faces Pain Scale: Hurts little more Pain Location: R hip  Pain Descriptors / Indicators: Aching;Tightness Pain Intervention(s): Limited activity within patient's tolerance;Monitored during session;Premedicated before session;Repositioned    Home Living Family/patient expects to be discharged to:: Private residence Living Arrangements: Spouse/significant other Available Help at Discharge:  Family;Available 24 hours/day Type of Home: Other(Comment) (townhouse) Home Access: Stairs to enter Entrance Stairs-Rails: None Home Layout: One level Home Equipment: Environmental consultant - 2 wheels;Cane -  single point;Bedside commode      Prior Function Level of Independence: Independent with assistive device(s)      Comments: Used cane at baseline; reports he was independent/mod independent with ADLs    PT Goals (current goals can now be found in the care plan section) Acute Rehab PT Goals Patient Stated Goal: to go home  PT Goal Formulation: With patient Time For Goal Achievement: 09/29/16 Potential to Achieve Goals: Good Progress towards PT goals: Progressing toward goals    Frequency    7X/week      PT Plan Current plan remains appropriate    Co-evaluation              AM-PAC PT "6 Clicks" Daily Activity  Outcome Measure  Difficulty turning over in bed (including adjusting bedclothes, sheets and blankets)?: A Little Difficulty moving from lying on back to sitting on the side of the bed? : A Little Difficulty sitting down on and standing up from a chair with arms (e.g., wheelchair, bedside commode, etc,.)?: A Lot Help needed moving to and from a bed to chair (including a wheelchair)?: A Little Help needed walking in hospital room?: A Little Help needed climbing 3-5 steps with a railing? : A Lot 6 Click Score: 16    End of Session Equipment Utilized During Treatment: Gait belt Activity Tolerance: Patient tolerated treatment well Patient left: with call bell/phone within reach;with family/visitor present;in bed;Other (comment) (sitting EOB with wife) Nurse Communication: Mobility status PT Visit Diagnosis: Other abnormalities of gait and mobility (R26.89);Pain Pain - Right/Left: Right Pain - part of body: Hip     Time: 0454-0981 PT Time Calculation (min) (ACUTE ONLY): 34 min  Charges:  $Gait Training: 8-22 mins $Therapeutic Exercise: 8-22 mins                    G Codes:       Erline Levine, PTA Pager: [phone]     Carolynne Edouard 09/23/2016, 12:35 PM

## 2016-09-23 NOTE — Progress Notes (Signed)
PATIENT ID: Gary [name]  MRN: [id number]  DOB/AGE:  [birth year] / 66 y.o.  1 Day Post-Op Procedure(s) (LRB): TOTAL HIP ARTHROPLASTY ANTERIOR APPROACH (Right)    PROGRESS NOTE Subjective: Patient is alert, oriented, No Nausea, No Vomiting, yes passing gas, . Taking PO Well. Denies SOB, Chest or Calf Pain. Using Incentive Spirometer, PAS in place. Ambulate 10 feet Patient reports pain as  3/10  .    Objective: Vital signs in last 24 hours: Vitals:   09/22/16 1549 09/22/16 2159 09/23/16 0057 09/23/16 0544  BP: 106/73 112/76 109/69 112/67  Pulse: 76 77 73 98  Resp: 18 16 16 18   Temp: 98.5 F (36.9 C) 98.1 F (36.7 C) 97.9 F (36.6 C) 98.2 F (36.8 C)  TempSrc: Oral Oral Oral Tympanic  SpO2: 91% 90% 94% 96%  Weight:          Intake/Output from previous day: I/O last 3 completed shifts: In: 3615.8 [P.O.:1220; I.V.:1945.8; Other:200; IV Piggyback:250] Out: 800 [Blood:800]   Intake/Output this shift: No intake/output data recorded.   LABORATORY DATA:  Recent Labs  09/22/16 0655 09/22/16 0743 09/23/16 0406  WBC 5.8 4.6 11.7*  HGB 14.3 13.3 10.7*  HCT 39.9 37.6* 31.1*  PLT PLATELET CLUMPS NOTED ON SMEAR, UNABLE TO ESTIMATE 80* 80*  INR 1.36  --   --     Examination: Neurologically intact ABD soft Neurovascular intact Sensation intact distally Intact pulses distally Dorsiflexion/Plantar flexion intact Incision: dressing C/D/I No cellulitis present Compartment soft} XR AP&Lat of hip shows well placed\fixed THA  Assessment:   1 Day Post-Op Procedure(s) (LRB): TOTAL HIP ARTHROPLASTY ANTERIOR APPROACH (Right) ADDITIONAL DIAGNOSIS:  Expected Acute Blood Loss Anemia, Hypertension,History of cirrhosis with elevated liver enzymes, history of alcohol abuse, ejection fraction 46%, Thrombocytopenia. Plan: PT/OT WBAT, THA  DVT Prophylaxis: SCDx72 hrs, ASA 325 mg BID x 2 weeks  DISCHARGE PLAN: Home  DISCHARGE NEEDS: HHPT, Walker and 3-in-1 comode seatPatient ID: Gary [name], male   DOB: 05-21-1950, 66 y.o.   MRN: [id number]

## 2016-09-23 NOTE — Evaluation (Signed)
Occupational Therapy Evaluation Patient Details Name: Gary [name] MRN: [id number] DOB: Oct 15, 1950 Today's Date: 09/23/2016    History of Present Illness Pt is a 66 y/o male s/p elective R THA. PMH incldues alcohol abuse, alcoholic hepatitis with ascites, gout, CHF, seizures, HTN, and thrombocytopenia.    Clinical Impression   This 66 y/o M presents with the above. At baseline Pt is mod independent with functional mobility and ADL completion. Pt currently requires MinA for functional mobility and MaxA for LB ADLs. Pt lives with spouse who will be available to assist with ADLs PRN after discharge home. Pt also with limitations in bil UE AROM due to pain, with Pt reporting increased shoulder pain recently due to compensating for hip pain/deficits. Will continue to follow while in acute setting to maximize Pt's safety and independence with ADLs and functional mobility.     Follow Up Recommendations  DC plan and follow up therapy as arranged by surgeon;Supervision/Assistance - 24 hour    Equipment Recommendations  None recommended by OT           Precautions / Restrictions Precautions Precautions: None Precaution Comments: Reviewed with Pt Restrictions Weight Bearing Restrictions: Yes RLE Weight Bearing: Weight bearing as tolerated      Mobility Bed Mobility Overal bed mobility: Needs Assistance Bed Mobility: Supine to Sit     Supine to sit: Min guard     General bed mobility comments: Min guard for safety. Required elevated HOB and use of bed rails.   Transfers Overall transfer level: Needs assistance Equipment used: Rolling walker (2 wheeled) Transfers: Sit to/from Stand Sit to Stand: Min assist         General transfer comment: assist to rise; Verbal cues for safe hand placement.     Balance Overall balance assessment: Needs assistance Sitting-balance support: No upper extremity supported;Feet supported Sitting balance-Leahy Scale: Good     Standing balance  support: Bilateral upper extremity supported Standing balance-Leahy Scale: Poor Standing balance comment: Reliant on RW for stability.                            ADL either performed or assessed with clinical judgement   ADL Overall ADL's : Needs assistance/impaired Eating/Feeding: Independent;Sitting   Grooming: Min guard;Standing   Upper Body Bathing: Min guard;Sitting   Lower Body Bathing: Minimal assistance;Sit to/from stand   Upper Body Dressing : Min guard;Sitting   Lower Body Dressing: Maximal assistance;Sit to/from stand   Toilet Transfer: Minimal assistance;Stand-pivot;RW;BSC Statistician Details (indicate cue type and reason): simulated through stand pivot transfer to recliner  Toileting- Clothing Manipulation and Hygiene: Minimal assistance;Sit to/from stand     Tub/Shower Transfer Details (indicate cue type and reason): verbally reviewed technique for transfer to walk-in shower, would benefit from additional education/practice; Pt plans to use 3:1 as shower chair during bathing  Functional mobility during ADLs: Rolling walker;Min guard General ADL Comments: Pt declining further mobility due to wanting to eat breakfast; Pt actively engaged in discussion regarding home setup and ADL completion after return home     Vision Baseline Vision/History: Wears glasses Wears Glasses: At all times Vision Assessment?: No apparent visual deficits                Pertinent Vitals/Pain Pain Assessment: Faces Faces Pain Scale: Hurts a little bit Pain Location: R hip  Pain Descriptors / Indicators: Aching;Operative site guarding;Sore Pain Intervention(s): Limited activity within patient's tolerance;Monitored during session;Ice applied;Premedicated before session  Hand Dominance Right   Extremity/Trunk Assessment Upper Extremity Assessment Upper Extremity Assessment: Generalized weakness;RUE deficits/detail;LUE deficits/detail RUE Deficits / Details: Pt  with limited AROM due to pain (R>L) (approx 0-100* AAROM), further PROM and MMT not attempted due to pain; tremor noted in R hand; Pt reports increased pain in UEs due to overcompensating for hip difficulties leading up to surgery  RUE: Unable to fully assess due to pain LUE Deficits / Details: Pt with limited AROM due to pain (approx 0-110* AAROM), further PROM and MMT not attempted due to pain; Pt reports increased pain in UEs due to overcompensating for hip difficulties leading up to surgery  LUE: Unable to fully assess due to pain   Lower Extremity Assessment Lower Extremity Assessment: Defer to PT evaluation   Cervical / Trunk Assessment Cervical / Trunk Assessment: Normal   Communication Communication Communication: No difficulties   Cognition Arousal/Alertness: Awake/alert Behavior During Therapy: WFL for tasks assessed/performed Overall Cognitive Status: Within Functional Limits for tasks assessed                                     General Comments  Pt's wife present during session                Home Living Family/patient expects to be discharged to:: Private residence Living Arrangements: Spouse/significant other Available Help at Discharge: Family;Available 24 hours/day Type of Home: Other(Comment) (townhouse) Home Access: Stairs to enter [employer] of Steps: 1 (sidewalk step ) Entrance Stairs-Rails: None Home Layout: One level     Bathroom Shower/Tub: Producer, television/film/video: Standard     Home Equipment: Environmental consultant - 2 wheels;Cane - single point;Bedside commode          Prior Functioning/Environment Level of Independence: Independent with assistive device(s)        Comments: Used cane at baseline; reports he was independent/mod independent with ADLs         OT Problem List: Decreased strength;Decreased activity tolerance;Impaired balance (sitting and/or standing);Impaired UE functional use      OT  Treatment/Interventions: Self-care/ADL training;DME and/or AE instruction;Therapeutic activities;Balance training;Therapeutic exercise;Energy conservation;Patient/family education    OT Goals(Current goals can be found in the care plan section) Acute Rehab OT Goals Patient Stated Goal: to go home  OT Goal Formulation: With patient Time For Goal Achievement: 09/30/16 Potential to Achieve Goals: Good ADL Goals Pt Will Perform Grooming: with modified independence;standing Pt Will Perform Upper Body Bathing: with modified independence;sitting Pt Will Perform Upper Body Dressing: with modified independence;sitting Pt Will Perform Lower Body Dressing: with min assist;sit to/from stand Pt Will Transfer to Toilet: with min guard assist;ambulating;bedside commode (BSC over toilet ) Pt Will Perform Toileting - Clothing Manipulation and hygiene: sit to/from stand;with modified independence Pt Will Perform Tub/Shower Transfer: with min guard assist;Shower transfer;ambulating;3 in 1;rolling walker  OT Frequency: Min 2X/week    AM-PAC PT "6 Clicks" Daily Activity     Outcome Measure Help from another person eating meals?: None Help from another person taking care of personal grooming?: A Little Help from another person toileting, which includes using toliet, bedpan, or urinal?: A Little Help from another person bathing (including washing, rinsing, drying)?: A Lot Help from another person to put on and taking off regular upper body clothing?: A Little Help from another person to put on and taking off regular lower body clothing?: A Lot 6 Click Score: 17  End of Session Equipment Utilized During Treatment: Gait belt;Rolling walker Nurse Communication: Mobility status  Activity Tolerance: Patient tolerated treatment well Patient left: in chair;with call bell/phone within reach;with family/visitor present  OT Visit Diagnosis: Muscle weakness (generalized) (M62.81)                Time: 4098-1191 OT  Time Calculation (min): 29 min Charges:  OT General Charges $OT Visit: 1 Procedure OT Evaluation $OT Eval Low Complexity: 1 Procedure OT Treatments $Self Care/Home Management : 8-22 mins G-Codes:     Marcy Siren, OT Pager [phone] 09/23/2016   Orlando Penner 09/23/2016, 9:37 AM

## 2016-09-23 NOTE — Anesthesia Postprocedure Evaluation (Signed)
Anesthesia Post Note  Patient: Gary [name]  Procedure(s) Performed: Procedure(s) (LRB): TOTAL HIP ARTHROPLASTY ANTERIOR APPROACH (Right)     Patient location during evaluation: PACU Anesthesia Type: General Level of consciousness: awake and alert Pain management: pain level controlled Vital Signs Assessment: post-procedure vital signs reviewed and stable Respiratory status: spontaneous breathing, nonlabored ventilation, respiratory function stable and patient connected to nasal cannula oxygen Cardiovascular status: blood pressure returned to baseline and stable Postop Assessment: no signs of nausea or vomiting Anesthetic complications: no    Last Vitals:  Vitals:   09/23/16 0057 09/23/16 0544  BP: 109/69 112/67  Pulse: 73 98  Resp: 16 18  Temp: 36.6 C 36.8 C                   Shelton Silvas

## 2016-09-23 NOTE — Care Management Note (Signed)
Case Management Note  Patient Details  Name: Gary [name] MRN: [id number] Date of Birth: 1950-07-10  Subjective/Objective:   66 yr old gentleman s/p right total hip arthroplasty.                 Action/Plan: Case manager spoke with patient concerning discharge plan and DME. Patient was preoperatively setup with [facility], no changes. He says he has RW and 3in1. Will have support at discharge.     Expected Discharge Date:  09/23/16               Expected Discharge Plan:  Home w Home Health Services  In-House Referral:  NA  Discharge planning Services  CM Consult  Post Acute Care Choice:  Home Health Choice offered to:  Patient  DME Arranged:  N/A (has RW and 3in1) DME Agency:  NA  HH Arranged:  PT HH Agency:  [facility]  Status of Service:  Completed  If discussed at Long Length of Stay Meetings, dates discussed:    Additional Comments:  Durenda Guthrie, RN 09/23/2016, 2:58 PM

## 2016-12-24 ENCOUNTER — Ambulatory Visit: Payer: Self-pay | Admitting: Cardiology

## 2017-05-03 ENCOUNTER — Emergency Department (HOSPITAL_COMMUNITY): Payer: Medicare HMO

## 2017-05-03 ENCOUNTER — Inpatient Hospital Stay (HOSPITAL_COMMUNITY): Payer: Medicare HMO

## 2017-05-03 ENCOUNTER — Inpatient Hospital Stay (HOSPITAL_COMMUNITY)
Admission: EM | Admit: 2017-05-03 | Discharge: 2017-05-14 | DRG: 433 | Disposition: A | Discharge status: Home / Self Care | Payer: Medicare HMO | Attending: Internal Medicine | Admitting: Internal Medicine

## 2017-05-03 ENCOUNTER — Encounter (HOSPITAL_COMMUNITY): Payer: Self-pay | Admitting: *Deleted

## 2017-05-03 ENCOUNTER — Other Ambulatory Visit: Payer: Self-pay

## 2017-05-03 DIAGNOSIS — B369 Superficial mycosis, unspecified: Secondary | ICD-10-CM | POA: Diagnosis present

## 2017-05-03 DIAGNOSIS — I110 Hypertensive heart disease with heart failure: Secondary | ICD-10-CM | POA: Diagnosis present

## 2017-05-03 DIAGNOSIS — E559 Vitamin D deficiency, unspecified: Secondary | ICD-10-CM | POA: Diagnosis present

## 2017-05-03 DIAGNOSIS — R17 Unspecified jaundice: Secondary | ICD-10-CM

## 2017-05-03 DIAGNOSIS — I2510 Atherosclerotic heart disease of native coronary artery without angina pectoris: Secondary | ICD-10-CM | POA: Diagnosis present

## 2017-05-03 DIAGNOSIS — K7011 Alcoholic hepatitis with ascites: Secondary | ICD-10-CM | POA: Diagnosis not present

## 2017-05-03 DIAGNOSIS — B372 Candidiasis of skin and nail: Secondary | ICD-10-CM | POA: Diagnosis not present

## 2017-05-03 DIAGNOSIS — I428 Other cardiomyopathies: Secondary | ICD-10-CM | POA: Diagnosis not present

## 2017-05-03 DIAGNOSIS — K7460 Unspecified cirrhosis of liver: Secondary | ICD-10-CM | POA: Diagnosis present

## 2017-05-03 DIAGNOSIS — Z8249 Family history of ischemic heart disease and other diseases of the circulatory system: Secondary | ICD-10-CM | POA: Diagnosis not present

## 2017-05-03 DIAGNOSIS — K449 Diaphragmatic hernia without obstruction or gangrene: Secondary | ICD-10-CM | POA: Diagnosis present

## 2017-05-03 DIAGNOSIS — D6959 Other secondary thrombocytopenia: Secondary | ICD-10-CM | POA: Diagnosis present

## 2017-05-03 DIAGNOSIS — K429 Umbilical hernia without obstruction or gangrene: Secondary | ICD-10-CM | POA: Diagnosis present

## 2017-05-03 DIAGNOSIS — I429 Cardiomyopathy, unspecified: Secondary | ICD-10-CM | POA: Diagnosis present

## 2017-05-03 DIAGNOSIS — W19XXXA Unspecified fall, initial encounter: Secondary | ICD-10-CM | POA: Diagnosis not present

## 2017-05-03 DIAGNOSIS — R278 Other lack of coordination: Secondary | ICD-10-CM | POA: Diagnosis present

## 2017-05-03 DIAGNOSIS — K219 Gastro-esophageal reflux disease without esophagitis: Secondary | ICD-10-CM | POA: Diagnosis present

## 2017-05-03 DIAGNOSIS — E871 Hypo-osmolality and hyponatremia: Secondary | ICD-10-CM | POA: Diagnosis present

## 2017-05-03 DIAGNOSIS — E785 Hyperlipidemia, unspecified: Secondary | ICD-10-CM | POA: Diagnosis present

## 2017-05-03 DIAGNOSIS — E860 Dehydration: Secondary | ICD-10-CM | POA: Diagnosis present

## 2017-05-03 DIAGNOSIS — I509 Heart failure, unspecified: Secondary | ICD-10-CM | POA: Diagnosis present

## 2017-05-03 DIAGNOSIS — D539 Nutritional anemia, unspecified: Secondary | ICD-10-CM | POA: Diagnosis present

## 2017-05-03 DIAGNOSIS — Z825 Family history of asthma and other chronic lower respiratory diseases: Secondary | ICD-10-CM | POA: Diagnosis not present

## 2017-05-03 DIAGNOSIS — Z811 Family history of alcohol abuse and dependence: Secondary | ICD-10-CM

## 2017-05-03 DIAGNOSIS — E872 Acidosis: Secondary | ICD-10-CM | POA: Diagnosis present

## 2017-05-03 DIAGNOSIS — N179 Acute kidney failure, unspecified: Secondary | ICD-10-CM

## 2017-05-03 DIAGNOSIS — I444 Left anterior fascicular block: Secondary | ICD-10-CM | POA: Diagnosis present

## 2017-05-03 DIAGNOSIS — L8990 Pressure ulcer of unspecified site, unspecified stage: Secondary | ICD-10-CM

## 2017-05-03 DIAGNOSIS — K828 Other specified diseases of gallbladder: Secondary | ICD-10-CM | POA: Diagnosis present

## 2017-05-03 DIAGNOSIS — K7290 Hepatic failure, unspecified without coma: Secondary | ICD-10-CM

## 2017-05-03 DIAGNOSIS — Z7982 Long term (current) use of aspirin: Secondary | ICD-10-CM

## 2017-05-03 DIAGNOSIS — E7800 Pure hypercholesterolemia, unspecified: Secondary | ICD-10-CM | POA: Diagnosis present

## 2017-05-03 DIAGNOSIS — R531 Weakness: Secondary | ICD-10-CM

## 2017-05-03 DIAGNOSIS — Z96641 Presence of right artificial hip joint: Secondary | ICD-10-CM | POA: Diagnosis present

## 2017-05-03 DIAGNOSIS — I498 Other specified cardiac arrhythmias: Secondary | ICD-10-CM

## 2017-05-03 DIAGNOSIS — D689 Coagulation defect, unspecified: Secondary | ICD-10-CM | POA: Diagnosis not present

## 2017-05-03 DIAGNOSIS — R000 Tachycardia, unspecified: Secondary | ICD-10-CM | POA: Diagnosis not present

## 2017-05-03 DIAGNOSIS — M1990 Unspecified osteoarthritis, unspecified site: Secondary | ICD-10-CM | POA: Diagnosis present

## 2017-05-03 DIAGNOSIS — G8929 Other chronic pain: Secondary | ICD-10-CM | POA: Diagnosis present

## 2017-05-03 DIAGNOSIS — K7040 Alcoholic hepatic failure without coma: Secondary | ICD-10-CM | POA: Diagnosis present

## 2017-05-03 DIAGNOSIS — K7031 Alcoholic cirrhosis of liver with ascites: Secondary | ICD-10-CM | POA: Diagnosis not present

## 2017-05-03 DIAGNOSIS — F4321 Adjustment disorder with depressed mood: Secondary | ICD-10-CM | POA: Diagnosis present

## 2017-05-03 DIAGNOSIS — Z515 Encounter for palliative care: Secondary | ICD-10-CM | POA: Diagnosis not present

## 2017-05-03 DIAGNOSIS — F10239 Alcohol dependence with withdrawal, unspecified: Secondary | ICD-10-CM | POA: Diagnosis present

## 2017-05-03 DIAGNOSIS — K7682 Hepatic encephalopathy: Secondary | ICD-10-CM

## 2017-05-03 DIAGNOSIS — K7030 Alcoholic cirrhosis of liver without ascites: Secondary | ICD-10-CM | POA: Diagnosis not present

## 2017-05-03 LAB — COMPREHENSIVE METABOLIC PANEL
ALK PHOS: 126 U/L (ref 38–126)
ALT: 87 U/L — ABNORMAL HIGH (ref 17–63)
AST: 181 U/L — ABNORMAL HIGH (ref 15–41)
Albumin: 2.5 g/dL — ABNORMAL LOW (ref 3.5–5.0)
Anion gap: 15 (ref 5–15)
BILIRUBIN TOTAL: 23.2 mg/dL — AB (ref 0.3–1.2)
BUN: 38 mg/dL — ABNORMAL HIGH (ref 6–20)
CALCIUM: 9.3 mg/dL (ref 8.9–10.3)
CO2: 17 mmol/L — ABNORMAL LOW (ref 22–32)
Chloride: 93 mmol/L — ABNORMAL LOW (ref 101–111)
Creatinine, Ser: 1.77 mg/dL — ABNORMAL HIGH (ref 0.61–1.24)
GFR calc non Af Amer: 38 mL/min — ABNORMAL LOW (ref >60)
GFR, EST AFRICAN AMERICAN: 44 mL/min — AB (ref >60)
GLUCOSE: 93 mg/dL (ref 65–99)
Potassium: 4.6 mmol/L (ref 3.5–5.1)
Sodium: 125 mmol/L — ABNORMAL LOW (ref 135–145)
TOTAL PROTEIN: 6.9 g/dL (ref 6.5–8.1)

## 2017-05-03 LAB — BODY FLUID CELL COUNT WITH DIFFERENTIAL
EOS FL: 1 %
Lymphs, Fluid: 7 %
MONOCYTE-MACROPHAGE-SEROUS FLUID: 71 % (ref 50–90)
Neutrophil Count, Fluid: 21 % (ref 0–25)
WBC FLUID: 181 uL (ref 0–1000)

## 2017-05-03 LAB — GRAM STAIN

## 2017-05-03 LAB — BASIC METABOLIC PANEL
ANION GAP: 11 (ref 5–15)
Anion gap: 11 (ref 5–15)
BUN: 35 mg/dL — ABNORMAL HIGH (ref 6–20)
BUN: 35 mg/dL — ABNORMAL HIGH (ref 6–20)
CALCIUM: 8.4 mg/dL — AB (ref 8.9–10.3)
CO2: 19 mmol/L — ABNORMAL LOW (ref 22–32)
CO2: 17 mmol/L — ABNORMAL LOW (ref 22–32)
CREATININE: 1.52 mg/dL — AB (ref 0.61–1.24)
CREATININE: 0.99 mg/dL (ref 0.61–1.24)
Calcium: 8.5 mg/dL — ABNORMAL LOW (ref 8.9–10.3)
Chloride: 97 mmol/L — ABNORMAL LOW (ref 101–111)
Chloride: 98 mmol/L — ABNORMAL LOW (ref 101–111)
GFR, EST AFRICAN AMERICAN: 53 mL/min — AB (ref >60)
GFR, EST NON AFRICAN AMERICAN: 46 mL/min — AB (ref >60)
Glucose, Bld: 99 mg/dL (ref 65–99)
Glucose, Bld: 97 mg/dL (ref 65–99)
Potassium: 4.5 mmol/L (ref 3.5–5.1)
Potassium: 4.9 mmol/L (ref 3.5–5.1)
SODIUM: 126 mmol/L — AB (ref 135–145)
Sodium: 127 mmol/L — ABNORMAL LOW (ref 135–145)

## 2017-05-03 LAB — CBC WITH DIFFERENTIAL/PLATELET
BASOS ABS: 0 10*3/uL (ref 0.0–0.1)
BASOS PCT: 0 %
Eosinophils Absolute: 0 10*3/uL (ref 0.0–0.7)
Eosinophils Relative: 0 %
HEMATOCRIT: 29.6 % — AB (ref 39.0–52.0)
HEMOGLOBIN: 11.1 g/dL — AB (ref 13.0–17.0)
LYMPHS PCT: 8 %
Lymphs Abs: 0.8 10*3/uL (ref 0.7–4.0)
MCH: 38.7 pg — AB (ref 26.0–34.0)
MCHC: 37.2 g/dL — AB (ref 30.0–36.0)
MCV: 103.1 fL — ABNORMAL HIGH (ref 78.0–100.0)
MONOS PCT: 19 %
Monocytes Absolute: 1.9 10*3/uL — ABNORMAL HIGH (ref 0.1–1.0)
NEUTROS ABS: 7.2 10*3/uL (ref 1.7–7.7)
Neutrophils Relative %: 73 %
Platelets: 115 10*3/uL — ABNORMAL LOW (ref 150–400)
RBC: 2.87 MIL/uL — ABNORMAL LOW (ref 4.22–5.81)
RDW: 17.7 % — ABNORMAL HIGH (ref 11.5–15.5)
WBC: 9.9 10*3/uL (ref 4.0–10.5)

## 2017-05-03 LAB — AMMONIA: Ammonia: 65 umol/L — ABNORMAL HIGH (ref 9–35)

## 2017-05-03 LAB — LIPASE, BLOOD: Lipase: 133 U/L — ABNORMAL HIGH (ref 11–51)

## 2017-05-03 LAB — PROTIME-INR
INR: 2.08
PROTHROMBIN TIME: 23.2 s — AB (ref 11.4–15.2)

## 2017-05-03 LAB — CK: Total CK: 135 U/L (ref 49–397)

## 2017-05-03 LAB — ETHANOL

## 2017-05-03 LAB — LACTIC ACID, PLASMA
LACTIC ACID, VENOUS: 1.6 mmol/L (ref 0.5–1.9)
Lactic Acid, Venous: 2.2 mmol/L (ref 0.5–1.9)

## 2017-05-03 LAB — ACETAMINOPHEN LEVEL: Acetaminophen (Tylenol), Serum: <10 ug/mL — ABNORMAL LOW (ref 10–30)

## 2017-05-03 LAB — I-STAT CG4 LACTIC ACID, ED: Lactic Acid, Venous: 4.43 mmol/L (ref 0.5–1.9)

## 2017-05-03 MED ORDER — PIPERACILLIN-TAZOBACTAM 3.375 G IVPB 30 MIN
3.3750 g | Freq: Once | INTRAVENOUS | Status: AC
  Administered 2017-05-03: 3.375 g via INTRAVENOUS
  Filled 2017-05-03: qty 50

## 2017-05-03 MED ORDER — SODIUM CHLORIDE 0.9 % IV BOLUS (SEPSIS)
1000.0000 mL | Freq: Once | INTRAVENOUS | Status: AC
  Administered 2017-05-03: 1000 mL via INTRAVENOUS

## 2017-05-03 MED ORDER — SODIUM CHLORIDE 0.9% FLUSH
3.0000 mL | Freq: Two times a day (BID) | INTRAVENOUS | Status: DC
  Administered 2017-05-03 – 2017-05-14 (×19): 3 mL via INTRAVENOUS

## 2017-05-03 MED ORDER — BACITRACIN ZINC 500 UNIT/GM EX OINT
TOPICAL_OINTMENT | CUTANEOUS | Status: AC
  Administered 2017-05-03: 13:00:00
  Filled 2017-05-03: qty 0.9

## 2017-05-03 MED ORDER — LORAZEPAM 2 MG/ML IJ SOLN: 1.0000 mg | Freq: Four times a day (QID) | INTRAMUSCULAR | Status: AC | PRN

## 2017-05-03 MED ORDER — FUROSEMIDE 40 MG PO TABS
40.0000 mg | ORAL_TABLET | Freq: Every day | ORAL | Status: DC
  Administered 2017-05-03 – 2017-05-11 (×9): 40 mg via ORAL
  Filled 2017-05-03 (×9): qty 1

## 2017-05-03 MED ORDER — HEPARIN SODIUM (PORCINE) 5000 UNIT/ML IJ SOLN
5000.0000 [IU] | Freq: Three times a day (TID) | INTRAMUSCULAR | Status: DC
  Administered 2017-05-03 – 2017-05-14 (×33): 5000 [IU] via SUBCUTANEOUS
  Filled 2017-05-03 (×32): qty 1

## 2017-05-03 MED ORDER — ZINC OXIDE 40 % EX OINT: TOPICAL_OINTMENT | Freq: Three times a day (TID) | CUTANEOUS | Status: DC

## 2017-05-03 MED ORDER — THIAMINE HCL 100 MG/ML IJ SOLN
100.0000 mg | Freq: Every day | INTRAMUSCULAR | Status: DC
  Filled 2017-05-03: qty 2

## 2017-05-03 MED ORDER — ACETAMINOPHEN 650 MG RE SUPP: 650.0000 mg | Freq: Four times a day (QID) | RECTAL | Status: DC | PRN

## 2017-05-03 MED ORDER — ZINC OXIDE 40 % EX OINT
TOPICAL_OINTMENT | Freq: Two times a day (BID) | CUTANEOUS | Status: DC
  Administered 2017-05-03: 1 via TOPICAL
  Administered 2017-05-04 – 2017-05-14 (×21): via TOPICAL
  Filled 2017-05-03 (×6): qty 57

## 2017-05-03 MED ORDER — FOLIC ACID 1 MG PO TABS
1.0000 mg | ORAL_TABLET | Freq: Every day | ORAL | Status: DC
  Administered 2017-05-03 – 2017-05-14 (×12): 1 mg via ORAL
  Filled 2017-05-03 (×12): qty 1

## 2017-05-03 MED ORDER — PIPERACILLIN-TAZOBACTAM 3.375 G IVPB
3.3750 g | Freq: Three times a day (TID) | INTRAVENOUS | Status: DC
  Administered 2017-05-03 – 2017-05-07 (×11): 3.375 g via INTRAVENOUS
  Filled 2017-05-03 (×11): qty 50

## 2017-05-03 MED ORDER — ONDANSETRON HCL 4 MG/2ML IJ SOLN: 4.0000 mg | Freq: Four times a day (QID) | INTRAMUSCULAR | Status: DC | PRN

## 2017-05-03 MED ORDER — PANTOPRAZOLE SODIUM 40 MG PO TBEC
40.0000 mg | DELAYED_RELEASE_TABLET | Freq: Every day | ORAL | Status: DC
  Administered 2017-05-03 – 2017-05-14 (×12): 40 mg via ORAL
  Filled 2017-05-03 (×12): qty 1

## 2017-05-03 MED ORDER — ADULT MULTIVITAMIN W/MINERALS CH
1.0000 | ORAL_TABLET | Freq: Every day | ORAL | Status: DC
  Administered 2017-05-03 – 2017-05-14 (×12): 1 via ORAL
  Filled 2017-05-03 (×12): qty 1

## 2017-05-03 MED ORDER — ACETAMINOPHEN 325 MG PO TABS: 650.0000 mg | ORAL_TABLET | Freq: Four times a day (QID) | ORAL | Status: DC | PRN

## 2017-05-03 MED ORDER — PROPRANOLOL HCL 10 MG PO TABS
10.0000 mg | ORAL_TABLET | Freq: Every day | ORAL | Status: DC
  Administered 2017-05-03 – 2017-05-11 (×9): 10 mg via ORAL
  Filled 2017-05-03 (×11): qty 1

## 2017-05-03 MED ORDER — LIDOCAINE VISCOUS 2 % MT SOLN
15.0000 mL | Freq: Once | OROMUCOSAL | Status: AC
  Administered 2017-05-03: 15 mL via OROMUCOSAL

## 2017-05-03 MED ORDER — LIDOCAINE HCL 1 % IJ SOLN
INTRAMUSCULAR | Status: AC
  Filled 2017-05-03: qty 20

## 2017-05-03 MED ORDER — BACITRACIN ZINC 500 UNIT/GM EX OINT
TOPICAL_OINTMENT | Freq: Once | CUTANEOUS | Status: AC
  Administered 2017-05-03: 13:00:00 via TOPICAL

## 2017-05-03 MED ORDER — LACTULOSE 10 GM/15ML PO SOLN
20.0000 g | Freq: Three times a day (TID) | ORAL | Status: DC
  Administered 2017-05-03 – 2017-05-04 (×4): 20 g via ORAL
  Filled 2017-05-03 (×4): qty 30

## 2017-05-03 MED ORDER — VANCOMYCIN HCL IN DEXTROSE 1-5 GM/200ML-% IV SOLN
1000.0000 mg | Freq: Once | INTRAVENOUS | Status: AC
  Administered 2017-05-03: 1000 mg via INTRAVENOUS
  Filled 2017-05-03: qty 200

## 2017-05-03 MED ORDER — VITAMIN B-1 100 MG PO TABS
100.0000 mg | ORAL_TABLET | Freq: Every day | ORAL | Status: DC
  Administered 2017-05-03 – 2017-05-14 (×12): 100 mg via ORAL
  Filled 2017-05-03 (×12): qty 1

## 2017-05-03 MED ORDER — NYSTATIN 100000 UNIT/GM EX OINT
TOPICAL_OINTMENT | Freq: Two times a day (BID) | CUTANEOUS | Status: DC
  Administered 2017-05-03: 1 via TOPICAL
  Administered 2017-05-04 – 2017-05-14 (×21): via TOPICAL
  Filled 2017-05-03 (×5): qty 15

## 2017-05-03 MED ORDER — ONDANSETRON HCL 4 MG PO TABS: 4.0000 mg | ORAL_TABLET | Freq: Four times a day (QID) | ORAL | Status: DC | PRN

## 2017-05-03 MED ORDER — ASPIRIN EC 325 MG PO TBEC
325.0000 mg | DELAYED_RELEASE_TABLET | Freq: Every day | ORAL | Status: DC
  Administered 2017-05-03 – 2017-05-14 (×12): 325 mg via ORAL
  Filled 2017-05-03 (×12): qty 1

## 2017-05-03 MED ORDER — LORAZEPAM 1 MG PO TABS
1.0000 mg | ORAL_TABLET | Freq: Four times a day (QID) | ORAL | Status: AC | PRN
  Administered 2017-05-04 (×2): 1 mg via ORAL
  Filled 2017-05-03 (×2): qty 1

## 2017-05-03 NOTE — Progress Notes (Signed)
A consult was received from an ED physician for vanc/Zosyn per pharmacy dosing.  The patient's profile has been reviewed for ht/wt/allergies/indication/available labs.   A one time order has been placed for vanc 1g and Zosyn 3.375g.  Further antibiotics/pharmacy consults should be ordered by admitting physician if indicated.                       Thank you, Berkley Harvey 05/03/2017  9:33 AM

## 2017-05-03 NOTE — Procedures (Signed)
Pre Procedural Dx: Symptomatic Ascites Post Procedural Dx: Same  Successful US guided paracentesis yielding 650 cc of serous ascitic fluid. Sample sent to laboratory as requested.  EBL: None  Complications: None immediate  Katherina Right, MD Pager #: [phone]

## 2017-05-03 NOTE — ED Notes (Signed)
Condom cath has been placed on pt. Waiting for urine return for UA

## 2017-05-03 NOTE — ED Notes (Signed)
Date and time results received: 05/03/17 1041 (use smartphrase ".now" to insert current time)  Test: Bilirubin Critical Value: 23.2  Name of Provider Notified: Kathlene November and EDP  Orders Received? Or Actions Taken?: Actions Taken: Kathlene November and EDP

## 2017-05-03 NOTE — ED Triage Notes (Signed)
Pt fell out of his chair last night and has been laying on the floor for the past 8 hours. Pt states he drank at least one 40oz beer yesterday. Pt appears jaundiced. Pt complains of pain in buttocks.

## 2017-05-03 NOTE — Progress Notes (Signed)
RN collected a Random Urine specimen. Sent to the laboratory for analysis.

## 2017-05-03 NOTE — H&P (Signed)
[facility] History and Physical  MICKY SHELLER WUJ:[id number] DOB: 1950-10-22 DOA: 05/03/2017  Referring physician:  PCP: [name], NP   Chief Complaint: "My wife noticed the color Friday."  HPI: Gary [name] is a 67 y.o. male past medical history of arthritis, hypertension, reflux, alcohol abuse, lower extremity edema, heart failure presents to the emergency room with chief complaint of weakness and jaundice.  Patient states that he has been feeling weaker over the last 4 weeks.  He is also had several weeks of red painful scrotum and red irritated gluteal cleft.  He did not seek medical attention for these issues.  Over the last week he has been eating and drinking less due to decreased appetite.  Patient also had 3 weeks where he vomits 1-2 times every morning, small volume, nonbloody nonbilious.  Patient states that he still drinks.  Last drink was last night.  States that his wife told him on Friday his eyes were turning yellow.  Patient normally walks with a cane but says he is not weak.  Got into his recliner and states that the chair slid him into the floor when he tried to sit up.  He was able to call a neighbor for help.  The neighbor helped him off the floor.  Patient then came to the emergency room for evaluation.  Course: EDP consulted gastroenterology due to elevated bilirubin.  Hospitalist consulted for admission.   Review of Systems:  As per HPI otherwise 10 point review of systems negative.    Past Medical History:  Diagnosis Date  . Abnormal echocardiography   . Acute encephalopathy   . Acute hepatitis    cirrohois  . Alcohol abuse   . Alcohol dependence (HCC) 02/28/2014  . Alcoholic cirrhosis (HCC)    Overview:  Hx of encephalopathy  . Alcoholic cirrhosis of liver without ascites (HCC)   . Alcoholic encephalopathy (HCC) 9/56/2130  . Alcoholic hepatitis    Overview:  Last Assessment & Plan:  SNF - pt started on aldactone and propranolol; cont same  .  Alcoholic hepatitis without ascites 02/25/2014  . Altered mental status 06/15/2015  . Arthritis    + gout, also OA in hip  . Arthritis of right hip 09/12/2016  . Ascites 04/25/2014  . CHF (congestive heart failure) (HCC)   . Chronic pain of right hip 11/09/2015  . Elevated CK 06/15/2015  . Elevated LFTs 11/21/2014  . Elevated liver enzymes   . Elevated troponin 06/15/2015  . Esophageal reflux   . Essential hypertension 11/10/2014   Overview:  Was on lisinopril 20 mg daily per records  . Fall 06/15/2015  . GERD (gastroesophageal reflux disease)   . Hepatic cirrhosis (HCC) 06/15/2015  . History of alcohol abuse 04/25/2014  . History of hiatal hernia   . Hyperbilirubinemia 03/01/2014   Overview:  Last Assessment & Plan:  Likely due to liver cirrhosis, ascites has macrocytic anemia, thrombocytopenia, hepatic encephalopathy/hyperammonemia - CT abdomen showed hepatic cirrhosis with degenerative nodules - Ultrasound showed severe hepatic steatosis, trace amount of perisplenic ascites - on steroids for acute alcoholic hepatitis, patient has GI appointment for further steroid taper outpatient , continue current dose for 28 days. - AFP 2.4 - Placed on propranolol, Aldactone 50 mg daily. Follow potassium level. SNF - cont propranolol and aldactone; f/u CMP for K and bilirubin  . Hypercholesterolemia   . Hypertension   . Hypokalemia   . Hypomagnesemia   . Hyponatremia   . Increased liver enzymes   .  Inflammatory liver disease 11/21/2014  . Left ankle pain 04/25/2014  . Lower extremity edema 04/25/2014  . Macrocytic anemia   . Maxillary sinus fracture (HCC) 04/05/2015  . Osteoarthritis of right hip 11/10/2015  . Polyarthritis of multiple sites   . Pure hypercholesterolemia 10/19/2014   Overview:  LDL 206 per records 03/2014. Not on statin due to liver disease  . Rash 04/25/2014  . Seizures (HCC)    pt. denies   . Shortness of breath 04/25/2014  . Thrombocytopenia (HCC)   . Tremors of nervous system   .  Vitamin D deficiency    Past Surgical History:  Procedure Laterality Date  . TONSILLECTOMY    . TOTAL HIP ARTHROPLASTY Right 09/22/2016   Procedure: TOTAL HIP ARTHROPLASTY ANTERIOR APPROACH;  Surgeon: Gean Birchwood, MD;  Location: [facility] OR;  Service: Orthopedics;  Laterality: Right;  . WISDOM TOOTH EXTRACTION     Social History:  reports that  has never smoked. he has never used smokeless tobacco. He reports that he drinks about 4.8 - 7.8 oz of alcohol per week. He reports that he does not use drugs.  Allergies  Allergen Reactions  . Ace Inhibitors Other (See Comments)    Cough with lisinopril    Family History  Problem Relation Age of Onset  . Heart failure Father   . Alcohol abuse Father   . Heart attack Father   . COPD Mother   . Colon cancer Neg Hx   . Colon polyps Neg Hx   . Diabetes Neg Hx   . Kidney disease Neg Hx   . Gallbladder disease Neg Hx   . Esophageal cancer Neg Hx      Prior to Admission medications   Medication Sig Start Date End Date Taking? Authorizing Provider  aspirin EC 325 MG tablet Take 1 tablet (325 mg total) by mouth 2 (two) times daily. Patient taking differently: Take 325 mg by mouth daily.  09/22/16  Yes Allena Katz, PA-C  folic acid (FOLVITE) 1 MG tablet Take 1 mg by mouth daily.   Yes [provider]  furosemide (LASIX) 40 MG tablet Take 40 mg by mouth daily.   Yes [provider]  hydroxypropyl methylcellulose / hypromellose (ISOPTO TEARS / GONIOVISC) 2.5 % ophthalmic solution Place 1 drop into both eyes 4 (four) times daily as needed for dry eyes. 06/20/15  Yes Rai, Ripudeep K, MD  Omega-3 Fatty Acids (FISH OIL) 500 MG CAPS Take 500 mg by mouth daily.   Yes [provider]  pantoprazole (PROTONIX) 40 MG tablet Take 1 tablet (40 mg total) by mouth daily. Patient taking differently: Take 40 mg by mouth daily before breakfast.  04/26/14  Yes Vassie Loll, MD  propranolol (INDERAL) 10 MG tablet Take 1 tablet (10 mg total)  by mouth 2 (two) times daily. Patient taking differently: Take 10 mg by mouth daily.  06/20/15  Yes Rai, Ripudeep K, MD  spironolactone (ALDACTONE) 50 MG tablet Take 1 tablet (50 mg total) by mouth daily. 06/20/15  Yes Rai, Ripudeep K, MD  tiZANidine (ZANAFLEX) 2 MG tablet Take 1 tablet (2 mg total) by mouth every 6 (six) hours as needed for muscle spasms. 09/22/16  Yes Allena Katz, PA-C  triamcinolone ointment (KENALOG) 0.1 % Apply 1 application topically 2 (two) times daily. 02/13/17  Yes [provider]  lactulose (CHRONULAC) 10 GM/15ML solution Take 45 mLs (30 g total) by mouth 3 (three) times daily. Patient not taking: Reported on 05/03/2017 11/13/14  Lonia Blood, MD  Multiple Vitamin (MULTIVITAMIN WITH MINERALS) TABS tablet Take 1 tablet by mouth daily. Patient not taking: Reported on 05/03/2017 03/04/14   Rodolph Bong, MD  oxyCODONE-acetaminophen (ROXICET) 5-325 MG tablet Take 1 tablet by mouth every 4 (four) hours as needed. Patient not taking: Reported on 05/03/2017 09/22/16   Allena Katz, PA-C   Physical Exam: Vitals:   05/03/17 1610 05/03/17 1022 05/03/17 1259 05/03/17 1356  BP: 104/79 112/78 92/67 101/68  Pulse: (!) 123 (!) 112 (!) 128   Resp: 20 18 18    Temp:      TempSrc:      SpO2: 96% 96% 94% 97%    Wt Readings from Last 3 Encounters:  09/22/16 93.4 kg (206 lb)  09/17/16 93.4 kg (206 lb)  09/11/16 90.7 kg (200 lb)    General:  Appears calm and comfortable; A&Ox3; jaundice Eyes:  PERRL, EOMI, normal lids, iris, scleral icterus  ENT:  grossly normal hearing, lips & tongue Neck:  no LAD, masses or thyromegaly Cardiovascular:  RRR, no m/r/g. No LE edema.  Respiratory:  CTA bilaterally, no w/r/r. Normal respiratory effort. Abdomen:  soft, nt, ascities, umbilical hernia Skin:  no rash or induration seen on limited exam Musculoskeletal:  grossly normal tone BUE/BLE Psychiatric:  grossly normal mood and affect, speech fluent and  appropriate Neurologic:  CN 2-12 grossly intact, moves all extremities in coordinated fashion.          Labs on Admission:  Basic Metabolic Panel: Recent Labs  Lab 05/03/17 0843  NA 125*  K 4.6  CL 93*  CO2 17*  GLUCOSE 93  BUN 38*  CREATININE 1.77*  CALCIUM 9.3   Liver Function Tests: Recent Labs  Lab 05/03/17 0843  AST 181*  ALT 87*  ALKPHOS 126  BILITOT 23.2*  PROT 6.9  ALBUMIN 2.5*   Recent Labs  Lab 05/03/17 0843  LIPASE 133*   Recent Labs  Lab 05/03/17 0845  AMMONIA 65*   CBC: Recent Labs  Lab 05/03/17 0843  WBC 9.9  NEUTROABS 7.2  HGB 11.1*  HCT 29.6*  MCV 103.1*  PLT 115*   Cardiac Enzymes: Recent Labs  Lab 05/03/17 0843  CKTOTAL 135    BNP (last 3 results) No results for input(s): BNP in the last 8760 hours.  ProBNP (last 3 results) No results for input(s): PROBNP in the last 8760 hours.   Creatinine clearance cannot be calculated (Unknown ideal weight.)  CBG: No results for input(s): GLUCAP in the last 168 hours.  Radiological Exams on Admission: Dg Chest 2 View  Result Date: 05/03/2017 CLINICAL DATA:  Shortness of breath, nonproductive cough EXAM: CHEST  2 VIEW COMPARISON:  09/11/2016 FINDINGS: Lungs are clear.  No pleural effusion or pneumothorax. The heart is normal in size. Visualized osseous structures are within normal limits. IMPRESSION: Normal chest radiographs. Electronically Signed   By: Charline Bills M.D.   On: 05/03/2017 09:51   Dg Pelvis 1-2 Views  Result Date: 05/03/2017 CLINICAL DATA:  Patient fell out of his chair last night. Recent right hip replacement. Complaining of pain. Right knee and ankle swelling. EXAM: PELVIS - 1-2 VIEW COMPARISON:  09/22/2016 FINDINGS: No fracture.  No bone lesion. Right total hip arthroplasty is well-seated and aligned. Left hip joint, SI joints and symphysis pubis are normally aligned. Soft tissues are unremarkable. IMPRESSION: 1. No fracture or dislocation. 2. No evidence of  loosening of the right hip arthroplasty. Electronically Signed   By: Amie Portland  M.D.   On: 05/03/2017 09:51    EKG: Independently reviewed. Tachycardia. No stemi.  Assessment/Plan Principal Problem:   Hyperbilirubinemia Active Problems:   Hyponatremia   AKI (acute kidney injury) (HCC)   Elevated Bili GI consulted by EDP Ultrasound ordered by GI - shows GB sludge and hepatic cyst Paracentesis ordered by GI  Hyponatremia Acute but duration unk, will correct slowly Likely due to dehydration  LA Likely due to dehydration & ETOH use  Scrotal fungal skin infection Nystatin and desitin bid  ETOH abuse  CIWA protocol Last drink 05/02/17  HTN Inderal qd  Weakness PT eval and tx tomorrow  CAD Cont asa  AKI Baseline Cr <1.0, Cr on admit 1.77 2L of normal saline given in the emergency room Gentle hydration overnight Checking magnesium and phosphorus Urine labs ordered to calculate fractional excretion of urea Hold lasix, spironolactone  GERD Cont PPI  Chronic pain Hold zanaflex  Code Status: FC  DVT Prophylaxis: heparin Family Communication: none available Disposition Plan: Pending Improvement  Status: inpt tele  Haydee Salter, MD Family Medicine [facility] [email or web address] Password [email or web address]

## 2017-05-03 NOTE — ED Provider Notes (Signed)
Emergency Department Provider Note   I have reviewed the triage vital signs and the nursing notes.   HISTORY  Chief Complaint Fall   HPI Gary [name] is a 67 y.o. male with PMH of alcoholic cirrhosis, EtOH abuse (continued), hepatic encephalopathy, dCHF (EF65-70%), anemia, HTN, and HLD presents to the emergency department for evaluation of generalized weakness after sliding out of his recliner chair last night.  Patient states that his wife left home 2 days ago and he has been in the recliner since then.  Last night he tried to elevate the feet of the recliner when they suddenly dropped to the floor and he slid out landing on his buttocks.  He had generalized weakness and was unable to get back in the chair.  He spent the next 8 hours on the ground until he was ultimately able to reach a telephone and called his neighbor.  The neighbor came over to help him up and got him something to eat.  Neighbor states that his jaundice seems less severe than he has seen in the past.   Patient denies any head trauma or loss of consciousness.  Denies any pain in the chest.  He does have pain in the buttocks endorses some chills throughout the evening.  Denies specific fever symptoms.  No recent infections.  Denies vomiting or diarrhea.  No new medications.  Patient states he currently drinks 1 beer per day but has been a heavier drinker in the past.    Past Medical History:  Diagnosis Date  . Abnormal echocardiography   . Acute encephalopathy   . Acute hepatitis    cirrohois  . Alcohol abuse   . Alcohol dependence (HCC) 02/28/2014  . Alcoholic cirrhosis (HCC)    Overview:  Hx of encephalopathy  . Alcoholic cirrhosis of liver without ascites (HCC)   . Alcoholic encephalopathy (HCC) 7/82/9562  . Alcoholic hepatitis    Overview:  Last Assessment & Plan:  SNF - pt started on aldactone and propranolol; cont same  . Alcoholic hepatitis without ascites 02/25/2014  . Altered mental status 06/15/2015    . Arthritis    + gout, also OA in hip  . Arthritis of right hip 09/12/2016  . Ascites 04/25/2014  . CHF (congestive heart failure) (HCC)   . Chronic pain of right hip 11/09/2015  . Elevated CK 06/15/2015  . Elevated LFTs 11/21/2014  . Elevated liver enzymes   . Elevated troponin 06/15/2015  . Esophageal reflux   . Essential hypertension 11/10/2014   Overview:  Was on lisinopril 20 mg daily per records  . Fall 06/15/2015  . GERD (gastroesophageal reflux disease)   . Hepatic cirrhosis (HCC) 06/15/2015  . History of alcohol abuse 04/25/2014  . History of hiatal hernia   . Hyperbilirubinemia 03/01/2014   Overview:  Last Assessment & Plan:  Likely due to liver cirrhosis, ascites has macrocytic anemia, thrombocytopenia, hepatic encephalopathy/hyperammonemia - CT abdomen showed hepatic cirrhosis with degenerative nodules - Ultrasound showed severe hepatic steatosis, trace amount of perisplenic ascites - on steroids for acute alcoholic hepatitis, patient has GI appointment for further steroid taper outpatient , continue current dose for 28 days. - AFP 2.4 - Placed on propranolol, Aldactone 50 mg daily. Follow potassium level. SNF - cont propranolol and aldactone; f/u CMP for K and bilirubin  . Hypercholesterolemia   . Hypertension   . Hypokalemia   . Hypomagnesemia   . Hyponatremia   . Increased liver enzymes   . Inflammatory liver disease 11/21/2014  .  Left ankle pain 04/25/2014  . Lower extremity edema 04/25/2014  . Macrocytic anemia   . Maxillary sinus fracture (HCC) 04/05/2015  . Osteoarthritis of right hip 11/10/2015  . Polyarthritis of multiple sites   . Pure hypercholesterolemia 10/19/2014   Overview:  LDL 206 per records 03/2014. Not on statin due to liver disease  . Rash 04/25/2014  . Seizures (HCC)    pt. denies   . Shortness of breath 04/25/2014  . Thrombocytopenia (HCC)   . Tremors of nervous system   . Vitamin D deficiency     Patient Active Problem List   Diagnosis Date Noted  . AKI  (acute kidney injury) (HCC) 05/03/2017  . Primary localized osteoarthritis of right hip 09/22/2016  . Abnormal EKG 09/17/2016  . Left anterior fascicular block (LAFB) 09/17/2016  . Cardiomyopathy (HCC) 09/17/2016  . Preoperative cardiovascular examination 09/17/2016  . Ectopic atrial rhythm 09/17/2016  . Arthritis of right hip 09/12/2016    Class: Chronic  . Osteoarthritis of right hip 11/10/2015  . Chronic pain of right hip 11/09/2015  . Alcoholic encephalopathy (HCC) 13/24/4010  . Vitamin D deficiency 06/28/2015  . Abnormal echocardiography   . Alcoholic hepatitis   . Altered mental status 06/15/2015  . Hepatic cirrhosis (HCC) 06/15/2015  . Elevated troponin 06/15/2015  . Elevated CK 06/15/2015  . Fall 06/15/2015  . Maxillary sinus fracture (HCC) 04/05/2015  . Elevated LFTs 11/21/2014  . Inflammatory liver disease 11/21/2014  . Alcohol abuse 11/21/2014  . Hypokalemia   . Hypomagnesemia   . Acute encephalopathy   . Increased liver enzymes   . Hypertensive heart disease 11/10/2014  . Pure hypercholesterolemia 10/19/2014  . Polyarthritis of multiple sites   . Esophageal reflux   . Alcoholic cirrhosis (HCC)   . Shortness of breath 04/25/2014  . History of alcohol abuse 04/25/2014  . Lower extremity edema 04/25/2014  . Ascites 04/25/2014  . Rash 04/25/2014  . Left ankle pain 04/25/2014  . Hyponatremia   . Hyperbilirubinemia 03/01/2014  . Macrocytic anemia 03/01/2014  . Thrombocytopenia (HCC) 03/01/2014  . Alcohol dependence (HCC) 02/28/2014  . Alcoholic hepatitis without ascites 02/25/2014  . Acute hepatitis 02/25/2014    Past Surgical History:  Procedure Laterality Date  . TONSILLECTOMY    . TOTAL HIP ARTHROPLASTY Right 09/22/2016   Procedure: TOTAL HIP ARTHROPLASTY ANTERIOR APPROACH;  Surgeon: Gean Birchwood, MD;  Location: [facility] OR;  Service: Orthopedics;  Laterality: Right;  . WISDOM TOOTH EXTRACTION        Allergies Ace inhibitors  Family History  Problem  Relation Age of Onset  . Heart failure Father   . Alcohol abuse Father   . Heart attack Father   . COPD Mother   . Colon cancer Neg Hx   . Colon polyps Neg Hx   . Diabetes Neg Hx   . Kidney disease Neg Hx   . Gallbladder disease Neg Hx   . Esophageal cancer Neg Hx     Social History Social History   Tobacco Use  . Smoking status: Never Smoker  . Smokeless tobacco: Never Used  Substance Use Topics  . Alcohol use: Yes    Alcohol/week: 4.8 - 7.8 oz    Types: 7 - 12 Cans of beer, 1 Shots of liquor per week    Comment: daily  . Drug use: No    Review of Systems  Constitutional: No fever. Positive chills. Positive generalized weakness.  Eyes: No visual changes. ENT: No sore throat. Cardiovascular: Denies chest pain. Respiratory: Denies  shortness of breath. Gastrointestinal: No abdominal pain.  No nausea, no vomiting.  No diarrhea.  No constipation. Genitourinary: Negative for dysuria. Musculoskeletal: Negative for back pain. Skin: Positive rash and perineum pain/irritation.  Neurological: Negative for headaches, focal weakness or numbness.  10-point ROS otherwise negative.  ____________________________________________   PHYSICAL EXAM:  VITAL SIGNS: ED Triage Vitals  Enc Vitals Group     BP 05/03/17 0826 104/79     Pulse Rate 05/03/17 0826 (!) 143     Resp 05/03/17 0826 18     Temp 05/03/17 0826 97.7 F (36.5 C)     Temp Source 05/03/17 0826 Oral     SpO2 05/03/17 0826 100 %     Pain Score 05/03/17 0827 10   Constitutional: Alert and oriented. Chronically ill-appearing with notable jaundice.  Eyes: Conjunctivae are normal. Icteric sclera.  Head: Atraumatic. Nose: No congestion/rhinnorhea. Mouth/Throat: Mucous membranes are dry.  Neck: No stridor.  Cardiovascular: Tachycardia. Good peripheral circulation. Grossly normal heart sounds.   Respiratory: Normal respiratory effort.  No retractions. Lungs CTAB. Gastrointestinal: Soft and nontender. No distention.    Musculoskeletal: No lower extremity tenderness nor edema. No gross deformities of extremities. Neurologic:  Normal speech and language. No gross focal neurologic deficits are appreciated.  Skin:  Skin is warm and dry. Notable jaundice on exam. Irritation over the scrotum, perineum, and gluteal cleft. No abscess. Some mild skin breakdown noted.   ____________________________________________   LABS (all labs ordered are listed, but only abnormal results are displayed)  Labs Reviewed  COMPREHENSIVE METABOLIC PANEL - Abnormal; Notable for the following components:      Result Value   Sodium 125 (*)    Chloride 93 (*)    CO2 17 (*)    BUN 38 (*)    Creatinine, Ser 1.77 (*)    Albumin 2.5 (*)    AST 181 (*)    ALT 87 (*)    Total Bilirubin 23.2 (*)    GFR calc non Af Amer 38 (*)    GFR calc Af Amer 44 (*)    All other components within normal limits  LIPASE, BLOOD - Abnormal; Notable for the following components:   Lipase 133 (*)    All other components within normal limits  ACETAMINOPHEN LEVEL - Abnormal; Notable for the following components:   Acetaminophen (Tylenol), Serum <10 (*)    All other components within normal limits  CBC WITH DIFFERENTIAL/PLATELET - Abnormal; Notable for the following components:   RBC 2.87 (*)    Hemoglobin 11.1 (*)    HCT 29.6 (*)    MCV 103.1 (*)    MCH 38.7 (*)    MCHC 37.2 (*)    RDW 17.7 (*)    Platelets 115 (*)    Monocytes Absolute 1.9 (*)    All other components within normal limits  PROTIME-INR - Abnormal; Notable for the following components:   Prothrombin Time 23.2 (*)    All other components within normal limits  AMMONIA - Abnormal; Notable for the following components:   Ammonia 65 (*)    All other components within normal limits  BASIC METABOLIC PANEL - Abnormal; Notable for the following components:   Sodium 127 (*)    Chloride 97 (*)    CO2 19 (*)    BUN 35 (*)    Creatinine, Ser 1.52 (*)    Calcium 8.5 (*)    GFR calc  non Af Amer 46 (*)    GFR calc Af Amer 53 (*)  All other components within normal limits  BASIC METABOLIC PANEL - Abnormal; Notable for the following components:   Sodium 126 (*)    Chloride 98 (*)    CO2 17 (*)    BUN 35 (*)    Calcium 8.4 (*)    All other components within normal limits  LACTIC ACID, PLASMA - Abnormal; Notable for the following components:   Lactic Acid, Venous 2.2 (*)    All other components within normal limits  I-STAT CG4 LACTIC ACID, ED - Abnormal; Notable for the following components:   Lactic Acid, Venous 4.43 (*)    All other components within normal limits  CULTURE, BLOOD (ROUTINE X 2)  CULTURE, BLOOD (ROUTINE X 2)  GRAM STAIN  ETHANOL  CK  LACTIC ACID, PLASMA  BODY FLUID CELL COUNT WITH DIFFERENTIAL  URINALYSIS, ROUTINE W REFLEX MICROSCOPIC  RAPID URINE DRUG SCREEN, HOSP PERFORMED  BASIC METABOLIC PANEL  BASIC METABOLIC PANEL  BASIC METABOLIC PANEL  CBC  BASIC METABOLIC PANEL  I-STAT CG4 LACTIC ACID, ED  CYTOLOGY - NON PAP   ____________________________________________  EKG   EKG Interpretation  Date/Time:  Sunday May 03 2017 08:52:25 EST Ventricular Rate:  134 PR Interval:    QRS Duration: 99 QT Interval:  316 QTC Calculation: 472 R Axis:   -73 Text Interpretation:  Junctional tachycardia Inferior infarct, old Anterior infarct, old No STEMI. Junctional rhythm is new.  Confirmed by Alona Bene [phone]) on 05/03/2017 8:55:13 AM Also confirmed by Alona Bene [phone]), editor [place], Tamera Punt ([id number])  on 05/03/2017 11:28:05 AM       ____________________________________________  RADIOLOGY  Dg Chest 2 View  Result Date: 05/03/2017 CLINICAL DATA:  Shortness of breath, nonproductive cough EXAM: CHEST  2 VIEW COMPARISON:  09/11/2016 FINDINGS: Lungs are clear.  No pleural effusion or pneumothorax. The heart is normal in size. Visualized osseous structures are within normal limits. IMPRESSION: Normal chest radiographs. Electronically Signed    By: Charline Bills M.D.   On: 05/03/2017 09:51   Dg Pelvis 1-2 Views  Result Date: 05/03/2017 CLINICAL DATA:  Patient fell out of his chair last night. Recent right hip replacement. Complaining of pain. Right knee and ankle swelling. EXAM: PELVIS - 1-2 VIEW COMPARISON:  09/22/2016 FINDINGS: No fracture.  No bone lesion. Right total hip arthroplasty is well-seated and aligned. Left hip joint, SI joints and symphysis pubis are normally aligned. Soft tissues are unremarkable. IMPRESSION: 1. No fracture or dislocation. 2. No evidence of loosening of the right hip arthroplasty. Electronically Signed   By: Amie Portland M.D.   On: 05/03/2017 09:51   US Paracentesis  Result Date: 05/03/2017 INDICATION: History of cirrhosis, now with symptomatic ascites. Please perform ultrasound-guided paracentesis for infection source control purposes. EXAM: ULTRASOUND-GUIDED PARACENTESIS COMPARISON:  Right upper quadrant abdominal ultrasound - earlier same day MEDICATIONS: None. COMPLICATIONS: None immediate. TECHNIQUE: Informed written consent was obtained from the patient after a discussion of the risks, benefits and alternatives to treatment. A timeout was performed prior to the initiation of the procedure. Initial ultrasound scanning demonstrates a small amount of ascites within the left lower abdominal quadrant. The left lower abdomen was prepped and draped in the usual sterile fashion. 1% lidocaine with epinephrine was used for local anesthesia. Under direct ultrasound guidance, a 19 gauge, 7-cm, Yueh catheter was introduced. An ultrasound image was saved for documentation purposed. The paracentesis was performed. The catheter was removed and a dressing was applied. The patient tolerated the procedure well without immediate post procedural complication.  FINDINGS: A total of approximately 650 cc of serous fluid was removed. Samples were sent to the laboratory as requested by the clinical team. IMPRESSION: Successful  ultrasound-guided paracentesis yielding 650 cc of peritoneal fluid. Electronically Signed   By: Simonne Come M.D.   On: 05/03/2017 14:45   US Abdomen Limited Ruq  Result Date: 05/03/2017 CLINICAL DATA:  Jaundice EXAM: ULTRASOUND ABDOMEN LIMITED RIGHT UPPER QUADRANT COMPARISON:  CT abdomen/pelvis dated 06/15/2015. FINDINGS: Gallbladder: Layering gallbladder sludge. No gallbladder wall thickening or pericholecystic fluid. Negative sonographic Murphy's sign. Common bile duct: Diameter: 5 mm Liver: Coarse hepatic echotexture with nodular hepatic contour, suggesting cirrhosis. 2.3 x 2.2 x 2.0 cm hepatic cyst. Portal vein is patent on color Doppler imaging with normal direction of blood flow towards the liver. Additional comments: Ascites. IMPRESSION: Cirrhosis.  2.3 cm hepatic cyst, benign. Ascites. Layering gallbladder sludge. Electronically Signed   By: Charline Bills M.D.   On: 05/03/2017 14:03    ____________________________________________   PROCEDURES  Procedure(s) performed:   .Critical Care  Performed by: Maia Plan, MD  Authorized by: Maia Plan, MD   Critical care provider statement:    Critical care time (minutes):  40   Critical care time was exclusive of:  Separately billable procedures and treating other patients and teaching time   Critical care was necessary to treat or prevent imminent or life-threatening deterioration of the following conditions:  Sepsis, dehydration and hepatic failure   Critical care was time spent personally by me on the following activities:  Blood draw for specimens, development of treatment plan with patient or surrogate, evaluation of patient's response to treatment, examination of patient, obtaining history from patient or surrogate, ordering and review of laboratory studies, ordering and performing treatments and interventions, ordering and review of radiographic studies, pulse oximetry, re-evaluation of patient's condition and review of old  charts   I assumed direction of critical care for this patient from another provider in my specialty: no       ____________________________________________   INITIAL IMPRESSION / ASSESSMENT AND PLAN / ED COURSE  Pertinent labs & imaging results that were available during my care of the patient were reviewed by me and considered in my medical decision making (see chart for details).  Patient presents to the emergency department with generalized weakness after falling out of his chair and spending 8 hours on the ground.  Here he is covered in feces which was cleaned on arrival.  He has underlying skin irritation in the scrotum, perineum, gluteal cleft.  No abscess.  He also has notable jaundice on exam.  He is a known drinker with alcoholic liver cirrhosis.  Has been afebrile here but very tachycardic.  The fall from the chair seems more like a slide to the floor landing on his buttocks.  No evidence of head trauma.  No indication for CT imaging of the head.   09:25 AM Patient lactate is > 4. This is likely multifactorial with known liver disease and clinical dehydration. Patient does have a junctional tachycardia and tells me that he had chills overnight. Patient also with erythema over the scrotum and buttocks which could be developing cellulitis. Will start 21ml/kg bolus IVF and Vanc/Zosyn per sepsis order set.   11:20 AM Spoke with [facility] who will consult. Agree with [place] RUQ to r/o obstruction but with painless jaundice would follow with CT if [place] negative to r/o pancreatic mass. They will leave additional recs upon consultation. Patient also with hyponatremia, AKI, and  slight elevated INR.  Discussed patient's case with Hospitalist, Dr. Melynda Ripple to request admission. Patient and family (if present) updated with plan. Care transferred to Hospitalist service.  I reviewed all nursing notes, vitals, pertinent old records, EKGs, labs, imaging (as  available).  ____________________________________________  FINAL CLINICAL IMPRESSION(S) / ED DIAGNOSES  Final diagnoses:  Jaundice  Generalized weakness  Fall, initial encounter  Alcoholic hepatitis with ascites     MEDICATIONS GIVEN DURING THIS VISIT:  Medications  lactulose (CHRONULAC) 10 GM/15ML solution 20 g (20 g Oral Given 05/03/17 1938)  lidocaine (XYLOCAINE) 1 % (with pres) injection (not administered)  nystatin ointment (MYCOSTATIN) (not administered)  liver oil-zinc oxide (DESITIN) 40 % ointment (not administered)  LORazepam (ATIVAN) tablet 1 mg (not administered)    Or  LORazepam (ATIVAN) injection 1 mg (not administered)  thiamine (VITAMIN B-1) tablet 100 mg (100 mg Oral Given 05/03/17 1937)    Or  thiamine (B-1) injection 100 mg ( Intravenous See Alternative 05/03/17 1937)  folic acid (FOLVITE) tablet 1 mg (1 mg Oral Given 05/03/17 1936)  multivitamin with minerals tablet 1 tablet (1 tablet Oral Given 05/03/17 1935)  aspirin EC tablet 325 mg (325 mg Oral Given 05/03/17 1936)  furosemide (LASIX) tablet 40 mg (40 mg Oral Given 05/03/17 1935)  pantoprazole (PROTONIX) EC tablet 40 mg (40 mg Oral Given 05/03/17 1937)  propranolol (INDERAL) tablet 10 mg (10 mg Oral Given 05/03/17 1937)  heparin injection 5,000 Units (not administered)  sodium chloride flush (NS) 0.9 % injection 3 mL (3 mLs Intravenous Given 05/03/17 1937)  acetaminophen (TYLENOL) tablet 650 mg (not administered)    Or  acetaminophen (TYLENOL) suppository 650 mg (not administered)  ondansetron (ZOFRAN) tablet 4 mg (not administered)    Or  ondansetron (ZOFRAN) injection 4 mg (not administered)  piperacillin-tazobactam (ZOSYN) IVPB 3.375 g (3.375 g Intravenous New Bag/Given 05/03/17 1940)  sodium chloride 0.9 % bolus 1,000 mL (0 mLs Intravenous Stopped 05/03/17 1020)  sodium chloride 0.9 % bolus 1,000 mL (0 mLs Intravenous Stopped 05/03/17 1136)  piperacillin-tazobactam (ZOSYN) IVPB 3.375 g (0 g Intravenous Stopped 05/03/17  1020)  vancomycin (VANCOCIN) IVPB 1000 mg/200 mL premix (0 mg Intravenous Stopped 05/03/17 1136)  sodium chloride 0.9 % bolus 1,000 mL (0 mLs Intravenous Stopped 05/03/17 1136)  lidocaine (XYLOCAINE) 2 % viscous mouth solution 15 mL (15 mLs Mouth/Throat Given 05/03/17 1301)  bacitracin ointment ( Topical Given 05/03/17 1301)  bacitracin 500 UNIT/GM ointment (  Given 05/03/17 1302)    Note:  This document was prepared using Dragon voice recognition software and may include unintentional dictation errors.  Alona Bene, MD Emergency Medicine   Long, Arlyss Repress, MD 05/03/17 2002

## 2017-05-03 NOTE — Consult Note (Addendum)
Referring Provider: [facility]  Primary Care Physician:  [name], NP Primary Gastroenterologist:  Stan Head  Reason for Consultation:   Elevated bilirubin    ASSESSMENT AND PLAN:    20.  67 year old male with history of alcoholism/cirrhosis presenting with decompensation.  He has asterixis on exam suggesting encephalopathy.  Appears to have ascites.  INR is 2.  -Need to rule out underlying infection as source for decompensation. If  workup negative will start prednisolone. CXR negative. Will obtain u/a. I ordered diagnostic paracentesis as well.  -AFP. Will need eventual imaging for HCC surveillance.  -Eventual EGD to screen for varices.  -ETOH avoidance is a must.  2. AKI. Cr 1.77  3. Tachycardia, ? ETOH withdrawal. Infectious? Says he only drinks one beer a day but AST / ALT ratio suggests otherwise. WBC normal. Lactic acid ~ 4. On antibiotics  3. Scrotal erythema, ? etiology  HPI: Gary [name] is a 67 y.o. male known to Dr. Leone Payor for history of cirrhosis /alcoholism.  He was hospitalized in 2015 and again in 2017 for jaundice and alcoholic hepatitis. Patient has not been seen in the office is 2015, there been compliance issues with keeping office.  We last saw patient during hospital admission April 2017.    Patient fell out of a chair at home last night and apparently laid on the floor for several hours reaching the telephone,  Last week his wife noticed eyes being yellow.  His bilirubin is around 23 today.  INR is 2.  Patient admits to drinking heavily for several years but over the last years he has consumed only 1 beer a day.In addition to home medications he takes a multiple vitamin,  no herbs.    Patient's weight is stable.  He has no abdominal pain nausea vomiting.  Itching. He is tachycardic and lactic acid level is 4.43.  He has acute kidney injury.   Past Medical History:  Diagnosis Date  . Abnormal echocardiography   . Acute encephalopathy   . Acute  hepatitis    cirrohois  . Alcohol abuse   . Alcohol dependence (HCC) 02/28/2014  . Alcoholic cirrhosis (HCC)    Overview:  Hx of encephalopathy  . Alcoholic cirrhosis of liver without ascites (HCC)   . Alcoholic encephalopathy (HCC) 2/95/6213  . Alcoholic hepatitis    Overview:  Last Assessment & Plan:  SNF - pt started on aldactone and propranolol; cont same  . Alcoholic hepatitis without ascites 02/25/2014  . Altered mental status 06/15/2015  . Arthritis    + gout, also OA in hip  . Arthritis of right hip 09/12/2016  . Ascites 04/25/2014  . CHF (congestive heart failure) (HCC)   . Chronic pain of right hip 11/09/2015  . Elevated CK 06/15/2015  . Elevated LFTs 11/21/2014  . Elevated liver enzymes   . Elevated troponin 06/15/2015  . Esophageal reflux   . Essential hypertension 11/10/2014   Overview:  Was on lisinopril 20 mg daily per records  . Fall 06/15/2015  . GERD (gastroesophageal reflux disease)   . Hepatic cirrhosis (HCC) 06/15/2015  . History of alcohol abuse 04/25/2014  . History of hiatal hernia   . Hyperbilirubinemia 03/01/2014   Overview:  Last Assessment & Plan:  Likely due to liver cirrhosis, ascites has macrocytic anemia, thrombocytopenia, hepatic encephalopathy/hyperammonemia - CT abdomen showed hepatic cirrhosis with degenerative nodules - Ultrasound showed severe hepatic steatosis, trace amount of perisplenic ascites - on steroids for acute alcoholic hepatitis, patient has GI  appointment for further steroid taper outpatient , continue current dose for 28 days. - AFP 2.4 - Placed on propranolol, Aldactone 50 mg daily. Follow potassium level. SNF - cont propranolol and aldactone; f/u CMP for K and bilirubin  . Hypercholesterolemia   . Hypertension   . Hypokalemia   . Hypomagnesemia   . Hyponatremia   . Increased liver enzymes   . Inflammatory liver disease 11/21/2014  . Left ankle pain 04/25/2014  . Lower extremity edema 04/25/2014  . Macrocytic anemia   . Maxillary sinus  fracture (HCC) 04/05/2015  . Osteoarthritis of right hip 11/10/2015  . Polyarthritis of multiple sites   . Pure hypercholesterolemia 10/19/2014   Overview:  LDL 206 per records 03/2014. Not on statin due to liver disease  . Rash 04/25/2014  . Seizures (HCC)    pt. denies   . Shortness of breath 04/25/2014  . Thrombocytopenia (HCC)   . Tremors of nervous system   . Vitamin D deficiency     Past Surgical History:  Procedure Laterality Date  . TONSILLECTOMY    . TOTAL HIP ARTHROPLASTY Right 09/22/2016   Procedure: TOTAL HIP ARTHROPLASTY ANTERIOR APPROACH;  Surgeon: Gean Birchwood, MD;  Location: [facility] OR;  Service: Orthopedics;  Laterality: Right;  . WISDOM TOOTH EXTRACTION      Prior to Admission medications   Medication Sig Start Date End Date Taking? Authorizing Provider  aspirin EC 325 MG tablet Take 1 tablet (325 mg total) by mouth 2 (two) times daily. Patient taking differently: Take 325 mg by mouth daily.  09/22/16  Yes Allena Katz, PA-C  folic acid (FOLVITE) 1 MG tablet Take 1 mg by mouth daily.   Yes [provider]  furosemide (LASIX) 40 MG tablet Take 40 mg by mouth daily.   Yes [provider]  hydroxypropyl methylcellulose / hypromellose (ISOPTO TEARS / GONIOVISC) 2.5 % ophthalmic solution Place 1 drop into both eyes 4 (four) times daily as needed for dry eyes. 06/20/15  Yes Rai, Ripudeep K, MD  Omega-3 Fatty Acids (FISH OIL) 500 MG CAPS Take 500 mg by mouth daily.   Yes [provider]  pantoprazole (PROTONIX) 40 MG tablet Take 1 tablet (40 mg total) by mouth daily. Patient taking differently: Take 40 mg by mouth daily before breakfast.  04/26/14  Yes Vassie Loll, MD  propranolol (INDERAL) 10 MG tablet Take 1 tablet (10 mg total) by mouth 2 (two) times daily. Patient taking differently: Take 10 mg by mouth daily.  06/20/15  Yes Rai, Ripudeep K, MD  spironolactone (ALDACTONE) 50 MG tablet Take 1 tablet (50 mg total) by mouth daily. 06/20/15  Yes Rai,  Ripudeep K, MD  tiZANidine (ZANAFLEX) 2 MG tablet Take 1 tablet (2 mg total) by mouth every 6 (six) hours as needed for muscle spasms. 09/22/16  Yes Allena Katz, PA-C  triamcinolone ointment (KENALOG) 0.1 % Apply 1 application topically 2 (two) times daily. 02/13/17  Yes [provider]  lactulose (CHRONULAC) 10 GM/15ML solution Take 45 mLs (30 g total) by mouth 3 (three) times daily. Patient not taking: Reported on 05/03/2017 11/13/14   Lonia Blood, MD  Multiple Vitamin (MULTIVITAMIN WITH MINERALS) TABS tablet Take 1 tablet by mouth daily. Patient not taking: Reported on 05/03/2017 03/04/14   Rodolph Bong, MD  oxyCODONE-acetaminophen (ROXICET) 5-325 MG tablet Take 1 tablet by mouth every 4 (four) hours as needed. Patient not taking: Reported on 05/03/2017 09/22/16   Allena Katz, PA-C    No  current facility-administered medications for this encounter.    Current Outpatient Medications  Medication Sig Dispense Refill  . aspirin EC 325 MG tablet Take 1 tablet (325 mg total) by mouth 2 (two) times daily. (Patient taking differently: Take 325 mg by mouth daily. ) 30 tablet 0  . folic acid (FOLVITE) 1 MG tablet Take 1 mg by mouth daily.    . furosemide (LASIX) 40 MG tablet Take 40 mg by mouth daily.    . hydroxypropyl methylcellulose / hypromellose (ISOPTO TEARS / GONIOVISC) 2.5 % ophthalmic solution Place 1 drop into both eyes 4 (four) times daily as needed for dry eyes. 15 mL 12  . Omega-3 Fatty Acids (FISH OIL) 500 MG CAPS Take 500 mg by mouth daily.    . pantoprazole (PROTONIX) 40 MG tablet Take 1 tablet (40 mg total) by mouth daily. (Patient taking differently: Take 40 mg by mouth daily before breakfast. ) 30 tablet 1  . propranolol (INDERAL) 10 MG tablet Take 1 tablet (10 mg total) by mouth 2 (two) times daily. (Patient taking differently: Take 10 mg by mouth daily. )    . spironolactone (ALDACTONE) 50 MG tablet Take 1 tablet (50 mg total) by mouth daily.    Marland Kitchen tiZANidine  (ZANAFLEX) 2 MG tablet Take 1 tablet (2 mg total) by mouth every 6 (six) hours as needed for muscle spasms. 60 tablet 0  . triamcinolone ointment (KENALOG) 0.1 % Apply 1 application topically 2 (two) times daily.    Marland Kitchen lactulose (CHRONULAC) 10 GM/15ML solution Take 45 mLs (30 g total) by mouth 3 (three) times daily. (Patient not taking: Reported on 05/03/2017) 240 mL 0  . Multiple Vitamin (MULTIVITAMIN WITH MINERALS) TABS tablet Take 1 tablet by mouth daily. (Patient not taking: Reported on 05/03/2017)    . oxyCODONE-acetaminophen (ROXICET) 5-325 MG tablet Take 1 tablet by mouth every 4 (four) hours as needed. (Patient not taking: Reported on 05/03/2017) 30 tablet 0    Allergies as of 05/03/2017 - Review Complete 05/03/2017  Allergen Reaction Noted  . Ace inhibitors Other (See Comments) 10/29/2015    Family History  Problem Relation Age of Onset  . Heart failure Father   . Alcohol abuse Father   . Heart attack Father   . COPD Mother   . Colon cancer Neg Hx   . Colon polyps Neg Hx   . Diabetes Neg Hx   . Kidney disease Neg Hx   . Gallbladder disease Neg Hx   . Esophageal cancer Neg Hx     Social History   Socioeconomic History  . Marital status: Married    Spouse name: Not on file  . Number of children: 2  . Years of education: Not on file  . Highest education level: Not on file  Social Needs  . Financial resource strain: Not on file  . Food insecurity - worry: Not on file  . Food insecurity - inability: Not on file  . Transportation needs - medical: Not on file  . Transportation needs - non-medical: Not on file  Occupational History  . Occupation: Retired  Tobacco Use  . Smoking status: Never Smoker  . Smokeless tobacco: Never Used  Substance and Sexual Activity  . Alcohol use: Yes    Alcohol/week: 4.8 - 7.8 oz    Types: 1 Shots of liquor, 7 - 12 Cans of beer per week    Comment: Pt stated does not drink now - 03/16/2014         04/05/2015  "  i  still drink 2 beers every date   "  . Drug use: No  . Sexual activity: Yes  Other Topics Concern  . Not on file  Social History Narrative  . Not on file    Review of Systems: All systems reviewed and negative except where noted in HPI.  Physical Exam: Vital signs in last 24 hours: Temp:  [97.7 F (36.5 C)] 97.7 F (36.5 C) (03/03 0826) Pulse Rate:  [112-143] 112 (03/03 1022) Resp:  [18-20] 18 (03/03 1022) BP: (104-112)/(78-79) 112/78 (03/03 1022) SpO2:  [96 %-100 %] 96 % (03/03 1022)   General:   Alert, well-developed, white male in NAD Psych:  Pleasant, cooperative. Normal mood and affect. Eyes:  Pupils equal, icteric sclera  Ears:  Normal auditory acuity. Nose:  No deformity, discharge,  or lesions. Neck:  Supple; no masses Lungs:  Clear throughout to auscultation.  Heart:  Sinus tachycardia, 2+ BLE edema Abdomen:  Soft, non-distended, nontender, BS active, no palp mass    Rectal:  Deferred  Scotum; very erythematous Msk:  Symmetrical without gross deformities. . Neurologic:  Alert and  oriented x4;  He has asterixis. Skin:  Intact without significant lesions or rashes..   Intake/Output from previous day: No intake/output data recorded. Intake/Output this shift: Total I/O In: 3250 [IV Piggyback:3250] Out: -   Lab Results: No results for input(s): WBC, HGB, HCT, PLT in the last 72 hours. BMET Recent Labs    05/03/17 0843  NA 125*  K 4.6  CL 93*  CO2 17*  GLUCOSE 93  BUN 38*  CREATININE 1.77*  CALCIUM 9.3   LFT Recent Labs    05/03/17 0843  PROT 6.9  ALBUMIN 2.5*  AST 181*  ALT 87*  ALKPHOS 126  BILITOT 23.2*   PT/INR Recent Labs    05/03/17 0843  LABPROT 23.2*  INR 2.08   Hepatitis Panel Negative in April 2017.   Studies/Results: Dg Chest 2 View  Result Date: 05/03/2017 CLINICAL DATA:  Shortness of breath, nonproductive cough EXAM: CHEST  2 VIEW COMPARISON:  09/11/2016 FINDINGS: Lungs are clear.  No pleural effusion or pneumothorax. The heart is normal in size.  Visualized osseous structures are within normal limits. IMPRESSION: Normal chest radiographs. Electronically Signed   By: Charline Bills M.D.   On: 05/03/2017 09:51   Dg Pelvis 1-2 Views  Result Date: 05/03/2017 CLINICAL DATA:  Patient fell out of his chair last night. Recent right hip replacement. Complaining of pain. Right knee and ankle swelling. EXAM: PELVIS - 1-2 VIEW COMPARISON:  09/22/2016 FINDINGS: No fracture.  No bone lesion. Right total hip arthroplasty is well-seated and aligned. Left hip joint, SI joints and symphysis pubis are normally aligned. Soft tissues are unremarkable. IMPRESSION: 1. No fracture or dislocation. 2. No evidence of loosening of the right hip arthroplasty. Electronically Signed   By: Amie Portland M.D.   On: 05/03/2017 09:51    Willette Cluster, NP-C @  05/03/2017, 11:44 AM  Pager number [phone]   Attending physician's note   I have taken a history, examined the patient and reviewed the chart. I agree with the Advanced Practitioner's note, impression and recommendations. 67 year old male history includes cirrhosis, ongoing alcohol use presented with decompensated liver failure.   MELD 32 and Discriminant function score >60 He is jaundiced with bilirubin 23, INR is elevated to 2 and has asterixis.  Diagnostic paracentesis to exclude SBP, his scrotal region is erythematous and tender with oozing of fluid, will need to exclude  cellulitis Follow-up abdominal ultrasound We will hold off starting prednisolone until we can exclude active infection Continue supportive care Currently on broad-spectrum antibiotics Lactulose 20 cc, 3 times daily Overall patient has poor prognosis and has high risk for increased mortality Discussed alcohol cessation  K Scherry Ran, MD [phone] Mon-Fri 8a-5p [phone] after 5p, weekends, holidays

## 2017-05-03 NOTE — ED Notes (Signed)
Patient transported to Ultrasound 

## 2017-05-04 DIAGNOSIS — K7682 Hepatic encephalopathy: Secondary | ICD-10-CM

## 2017-05-04 DIAGNOSIS — W19XXXA Unspecified fall, initial encounter: Secondary | ICD-10-CM

## 2017-05-04 DIAGNOSIS — R531 Weakness: Secondary | ICD-10-CM

## 2017-05-04 DIAGNOSIS — R17 Unspecified jaundice: Secondary | ICD-10-CM

## 2017-05-04 DIAGNOSIS — K7290 Hepatic failure, unspecified without coma: Secondary | ICD-10-CM

## 2017-05-04 LAB — DIFFERENTIAL
BASOS ABS: 0 10*3/uL (ref 0.0–0.1)
BASOS PCT: 0 %
Eosinophils Absolute: 0.1 10*3/uL (ref 0.0–0.7)
Eosinophils Relative: 1 %
Lymphocytes Relative: 17 %
Lymphs Abs: 1.3 10*3/uL (ref 0.7–4.0)
MONO ABS: 1.3 10*3/uL — AB (ref 0.1–1.0)
Monocytes Relative: 18 %
NEUTROS ABS: 4.7 10*3/uL (ref 1.7–7.7)
NEUTROS PCT: 64 %

## 2017-05-04 LAB — BASIC METABOLIC PANEL
ANION GAP: 10 (ref 5–15)
ANION GAP: 11 (ref 5–15)
ANION GAP: 10 (ref 5–15)
BUN: 37 mg/dL — ABNORMAL HIGH (ref 6–20)
BUN: 39 mg/dL — ABNORMAL HIGH (ref 6–20)
BUN: 35 mg/dL — ABNORMAL HIGH (ref 6–20)
CALCIUM: 8.6 mg/dL — AB (ref 8.9–10.3)
CALCIUM: 8.6 mg/dL — AB (ref 8.9–10.3)
CHLORIDE: 98 mmol/L — AB (ref 101–111)
CO2: 19 mmol/L — ABNORMAL LOW (ref 22–32)
CO2: 20 mmol/L — ABNORMAL LOW (ref 22–32)
CO2: 19 mmol/L — AB (ref 22–32)
Calcium: 8.3 mg/dL — ABNORMAL LOW (ref 8.9–10.3)
Chloride: 99 mmol/L — ABNORMAL LOW (ref 101–111)
Chloride: 100 mmol/L — ABNORMAL LOW (ref 101–111)
Creatinine, Ser: 1.34 mg/dL — ABNORMAL HIGH (ref 0.61–1.24)
Creatinine, Ser: 1.21 mg/dL (ref 0.61–1.24)
Creatinine, Ser: 1.32 mg/dL — ABNORMAL HIGH (ref 0.61–1.24)
GFR calc non Af Amer: 54 mL/min — ABNORMAL LOW (ref >60)
GFR, EST NON AFRICAN AMERICAN: 53 mL/min — AB (ref >60)
Glucose, Bld: 115 mg/dL — ABNORMAL HIGH (ref 65–99)
Glucose, Bld: 102 mg/dL — ABNORMAL HIGH (ref 65–99)
Glucose, Bld: 136 mg/dL — ABNORMAL HIGH (ref 65–99)
POTASSIUM: 4 mmol/L (ref 3.5–5.1)
POTASSIUM: 3.7 mmol/L (ref 3.5–5.1)
POTASSIUM: 3.5 mmol/L (ref 3.5–5.1)
SODIUM: 128 mmol/L — AB (ref 135–145)
Sodium: 128 mmol/L — ABNORMAL LOW (ref 135–145)
Sodium: 130 mmol/L — ABNORMAL LOW (ref 135–145)

## 2017-05-04 LAB — RAPID URINE DRUG SCREEN, HOSP PERFORMED
AMPHETAMINES: NOT DETECTED
BENZODIAZEPINES: NOT DETECTED
Barbiturates: NOT DETECTED
Cocaine: NOT DETECTED
OPIATES: NOT DETECTED
TETRAHYDROCANNABINOL: NOT DETECTED

## 2017-05-04 LAB — URINALYSIS, ROUTINE W REFLEX MICROSCOPIC
Glucose, UA: NEGATIVE mg/dL
Hgb urine dipstick: NEGATIVE
Ketones, ur: NEGATIVE mg/dL
Leukocytes, UA: NEGATIVE
Nitrite: NEGATIVE
Protein, ur: NEGATIVE mg/dL
SPECIFIC GRAVITY, URINE: 1.014 (ref 1.005–1.030)
pH: 5 (ref 5.0–8.0)

## 2017-05-04 LAB — HEPATIC FUNCTION PANEL
ALBUMIN: 2.2 g/dL — AB (ref 3.5–5.0)
ALT: 78 U/L — AB (ref 17–63)
AST: 165 U/L — AB (ref 15–41)
Alkaline Phosphatase: 99 U/L (ref 38–126)
BILIRUBIN INDIRECT: 9.5 mg/dL — AB (ref 0.3–0.9)
Bilirubin, Direct: 13.2 mg/dL — ABNORMAL HIGH (ref 0.1–0.5)
TOTAL PROTEIN: 5.5 g/dL — AB (ref 6.5–8.1)
Total Bilirubin: 22.7 mg/dL (ref 0.3–1.2)

## 2017-05-04 LAB — CBC
HCT: 30 % — ABNORMAL LOW (ref 39.0–52.0)
HEMOGLOBIN: 11 g/dL — AB (ref 13.0–17.0)
MCH: 38.2 pg — ABNORMAL HIGH (ref 26.0–34.0)
MCHC: 36.7 g/dL — ABNORMAL HIGH (ref 30.0–36.0)
MCV: 104.2 fL — ABNORMAL HIGH (ref 78.0–100.0)
Platelets: 91 10*3/uL — ABNORMAL LOW (ref 150–400)
RBC: 2.88 MIL/uL — AB (ref 4.22–5.81)
RDW: 18 % — ABNORMAL HIGH (ref 11.5–15.5)
WBC: 6.9 10*3/uL (ref 4.0–10.5)

## 2017-05-04 LAB — PHOSPHORUS: PHOSPHORUS: 2.9 mg/dL (ref 2.5–4.6)

## 2017-05-04 LAB — MAGNESIUM: Magnesium: 1.5 mg/dL — ABNORMAL LOW (ref 1.7–2.4)

## 2017-05-04 MED ORDER — MAGNESIUM SULFATE 2 GM/50ML IV SOLN
2.0000 g | Freq: Once | INTRAVENOUS | Status: AC
  Administered 2017-05-04: 2 g via INTRAVENOUS
  Filled 2017-05-04: qty 50

## 2017-05-04 MED ORDER — LACTULOSE 10 GM/15ML PO SOLN
30.0000 g | Freq: Three times a day (TID) | ORAL | Status: DC
  Administered 2017-05-04 – 2017-05-08 (×10): 30 g via ORAL
  Filled 2017-05-04 (×10): qty 60

## 2017-05-04 NOTE — Progress Notes (Addendum)
Progress Note   Subjective  Chief Complaint: Alcoholic cirrhosis/ hepatitis  This morning found sitting up in his bedside chair, tells me that he feels "very tired".  Decreased appetite.  No new complaints.   Objective   Vital signs in last 24 hours: Temp:  [97.8 F (36.6 C)-98.4 F (36.9 C)] 97.8 F (36.6 C) (03/04 0359) Pulse Rate:  [76-132] 118 (03/04 0359) Resp:  [16-20] 18 (03/04 0359) BP: (90-127)/(58-111) 102/73 (03/04 0359) SpO2:  [94 %-100 %] 95 % (03/04 0359) Weight:  [228 lb 2.8 oz (103.5 kg)-231 lb 0.7 oz (104.8 kg)] 231 lb 0.7 oz (104.8 kg) (03/04 0359) Last BM Date: 05/03/17 General:    Jaundiced Caucasian male in NAD Heart:  Regular rate and rhythm; no murmurs Lungs: Respirations even and unlabored, lungs CTA bilaterally Abdomen:  Soft, mild generalized TTP, mild distentionNormal bowel sounds. Extremities:  Without edema. Neurologic:  Alert and oriented,  grossly normal neurologically. Psych:  Cooperative. Normal mood and affect.  Intake/Output from previous day: 03/03 0701 - 03/04 0700 In: 3690 [P.O.:340; IV Piggyback:3350] Out: 400 [Urine:400] Intake/Output this shift: Total I/O In: -  Out: 325 [Urine:325]  Lab Results: Recent Labs    05/03/17 0843 05/04/17 0749  WBC 9.9 6.9  HGB 11.1* 11.0*  HCT 29.6* 30.0*  PLT 115* 91*   BMET Recent Labs    05/03/17 1831 05/04/17 0010 05/04/17 0749  NA 126* 128* 130*  K 4.9 4.0 3.7  CL 98* 99* 100*  CO2 17* 19* 19*  GLUCOSE 97 115* 102*  BUN 35* 37* 39*  CREATININE 0.99 1.34* 1.21  CALCIUM 8.4* 8.6* 8.6*   Hepatic Function Latest Ref Rng & Units 05/04/2017 05/03/2017 09/22/2016  Total Protein 6.5 - 8.1 g/dL 5.5(L) 6.9 6.6  Albumin 3.5 - 5.0 g/dL 2.2(L) 2.5(L) 3.4(L)  AST 15 - 41 U/L 165(H) 181(H) 41  ALT 17 - 63 U/L 78(H) 87(H) 23  Alk Phosphatase 38 - 126 U/L 99 126 72  Total Bilirubin 0.3 - 1.2 mg/dL 22.7(HH) 23.2(HH) 3.4(H)  Bilirubin, Direct 0.1 - 0.5 mg/dL 13.2(H) - 0.6(H)    PT/INR Recent Labs    05/03/17 0843  LABPROT 23.2*  INR 2.08    Studies/Results: Dg Chest 2 View  Result Date: 05/03/2017 CLINICAL DATA:  Shortness of breath, nonproductive cough EXAM: CHEST  2 VIEW COMPARISON:  09/11/2016 FINDINGS: Lungs are clear.  No pleural effusion or pneumothorax. The heart is normal in size. Visualized osseous structures are within normal limits. IMPRESSION: Normal chest radiographs. Electronically Signed   By: Julian Hy M.D.   On: 05/03/2017 09:51   Dg Pelvis 1-2 Views  Result Date: 05/03/2017 CLINICAL DATA:  Patient fell out of his chair last night. Recent right hip replacement. Complaining of pain. Right knee and ankle swelling. EXAM: PELVIS - 1-2 VIEW COMPARISON:  09/22/2016 FINDINGS: No fracture.  No bone lesion. Right total hip arthroplasty is well-seated and aligned. Left hip joint, SI joints and symphysis pubis are normally aligned. Soft tissues are unremarkable. IMPRESSION: 1. No fracture or dislocation. 2. No evidence of loosening of the right hip arthroplasty. Electronically Signed   By: Lajean Manes M.D.   On: 05/03/2017 09:51   US Paracentesis  Result Date: 05/03/2017 INDICATION: History of cirrhosis, now with symptomatic ascites. Please perform ultrasound-guided paracentesis for infection source control purposes. EXAM: ULTRASOUND-GUIDED PARACENTESIS COMPARISON:  Right upper quadrant abdominal ultrasound - earlier same day MEDICATIONS: None. COMPLICATIONS: None immediate. TECHNIQUE: Informed written consent was obtained from the patient  after a discussion of the risks, benefits and alternatives to treatment. A timeout was performed prior to the initiation of the procedure. Initial ultrasound scanning demonstrates a small amount of ascites within the left lower abdominal quadrant. The left lower abdomen was prepped and draped in the usual sterile fashion. 1% lidocaine with epinephrine was used for local anesthesia. Under direct ultrasound guidance, a  19 gauge, 7-cm, Yueh catheter was introduced. An ultrasound image was saved for documentation purposed. The paracentesis was performed. The catheter was removed and a dressing was applied. The patient tolerated the procedure well without immediate post procedural complication. FINDINGS: A total of approximately 650 cc of serous fluid was removed. Samples were sent to the laboratory as requested by the clinical team. IMPRESSION: Successful ultrasound-guided paracentesis yielding 650 cc of peritoneal fluid. Electronically Signed   By: Sandi Mariscal M.D.   On: 05/03/2017 14:45   US Abdomen Limited Ruq  Result Date: 05/03/2017 CLINICAL DATA:  Jaundice EXAM: ULTRASOUND ABDOMEN LIMITED RIGHT UPPER QUADRANT COMPARISON:  CT abdomen/pelvis dated 06/15/2015. FINDINGS: Gallbladder: Layering gallbladder sludge. No gallbladder wall thickening or pericholecystic fluid. Negative sonographic Murphy's sign. Common bile duct: Diameter: 5 mm Liver: Coarse hepatic echotexture with nodular hepatic contour, suggesting cirrhosis. 2.3 x 2.2 x 2.0 cm hepatic cyst. Portal vein is patent on color Doppler imaging with normal direction of blood flow towards the liver. Additional comments: Ascites. IMPRESSION: Cirrhosis.  2.3 cm hepatic cyst, benign. Ascites. Layering gallbladder sludge. Electronically Signed   By: Julian Hy M.D.   On: 05/03/2017 14:03       Assessment / Plan:   Assessment: 1.  Alcoholic cirrhosis with hepatitis and decompensation: Meld 32, discriminant function score greater than 60, jaundice with a bilirubin of 23-->22.7 today, paracentesis yesterday with removal of 650 cc of serous fluid 2.  AK I: improved Crea 0.99 today  3.  Scrotal erythema: Thought fungal, nystatin and Desitin ordered  Plan: 1.  Again will need eventual EGD to screen for varices and imaging for [facility] surveillance 2.  Continue broad-spectrum antibiotics 3.  Continue lactulose 20 cc 3 times daily 4.  Cytology pending from paracentesis  yesterday 5.  Please await any further recommendations from Dr. Silverio Decamp later today.  Thank you for your kind consultation, we will continue to follow.   LOS: 1 day   Levin Erp  05/04/2017, 11:52 AM  Pager # [phone]   Attending physician's note   I have taken an interval history, reviewed the chart and examined the patient. I agree with the Advanced Practitioner's note, impression and recommendations.  Alcoholic cirrhosis decompensated with acute alcoholic hepatitis and is being monitored for alcohol withdrawal No evidence of SBP based on diagnostic paracentesis. He has persistent asterixis on exam, will increase lactulose to 30 g 3 times daily. We will request abdominal ultrasound with Dopplers to exclude portal vein thrombus and also for hepatocellular carcinoma screening. Discriminant function score greater than 32.  Currently on broad-spectrum antibiotics. Will hold off starting prednisolone until it is confirmed he has no active infection  K Denzil Magnuson, MD [phone] Mon-Fri 8a-5p [phone] after 5p, weekends, holidays

## 2017-05-04 NOTE — Progress Notes (Signed)
PROGRESS NOTE    MACAI SISNEROS  ZOX:[id number] DOB: 1950-03-22 DOA: 05/03/2017 PCP: [name], Gary   Brief Narrative:  Gary [name] is a 67 y.o. male past medical history of Gary [name], Gary Gary [name], Gary Gary [name] that he has been feeling weaker over the last 4 weeks.  He is also had several weeks of red painful scrotum and red irritated gluteal cleft.  He did not seek medical attention for these issues.  Over the last week he has been eating and drinking less due to decreased appetite.  Patient also had 3 weeks where he vomits 1-2 times every morning, small volume, nonbloody nonbilious.  Patient [name] that he still drinks.  Last drink was last night. [name] that his wife told him on Friday his eyes were turning yellow.  Patient normally walks with a cane but says he is not weak.  Got into his recliner and [name] that the chair slid him into the floor when he tried to sit up.  He was able to call a neighbor for help.  The neighbor helped him off the floor. Patient then came to the emergency room for evaluation.  EDP consulted gastroenterology due to elevated bilirubin.  Hospitalist consulted for admission and patient currently being treated for Decompensated Alcoholic Liver Cirrhosis and Alcoholic Hepatitis.   Assessment & Plan:   Principal Problem:   Hyperbilirubinemia Active Problems:   Hyponatremia   AKI (acute kidney injury) (HCC)  Acute Decompensated Alcoholic Liver Cirrhosis -Maddrey Discriminant Function Score was >60; Defer to Gastroenterology to start Prednisolone when Active Infection is r/o'd out -C/w Furosemide 40 mg po Daily; Spironolactone held -Underwent Paracentesis to exclude SBP -C/w Zosyn for SBP Prophylaxis -C/w Propanolol 10 mg po Daily  -GI checking AFP and will need eventual imaging for HCC Survelliance -Counseled on EtOH  [name]  Gary [name] /Concern for Withdrawal -Last Drink was 05/02/17 -Had Tremors today and some confusion -C/w CIWA Protocol with Lorazepam 1 mg po/IV -C/w Folic Acid 1 mg po Daily, MVI, and Thiamine   Alcoholic Hepatitis -As Above -GI Following   Hyperammonemia -Patient's Ammonia Level was 65 -C/w Lactulose 20 g TID Daily   Hyperbilirubinemia  -Patient's Total Bilirubin was 23.2 and went to 22.7 -GI Consulted by EDP -U/S ordered and showed Cirrhosis.  2.3 cm hepatic cyst, benign. Ascites. Layering gallbladder sludge. -GI ordered Paracentesis and was done by IR  Hyponatremia -Likely 2/2 Beer Potomania -Sodium was 128 -Continue to Monitor   Lactic Acidosis  -Likely due to dehydration & ETOH use -Improved as LA went from 4.43 -> 2.2 -> 1.6  Scrotal Fungal Skin infection -C/w Nystatin Ointment and with Desitin topically BID  HTN -C/w Propanol 10 mg po Daily   Generalized Weakness -PT Evaluation recommending SNF if patient Agreeable   CAD -C/w ASA 325 mg po Daily   AKI -Baseline Cr <1.0, Cr on admit 1.77 -2L of normal saline given in the emergency room -Gentle hydration overnight now stopped -Patient's po Lasix 40 mg restarted  -Patient's BUN/Cr went from 37/1.34 -> 39/1.21 -> 35/1.32  GERD -C/w Pantoprazole 40 mg po Daily   Chronic Pain -Continue to hold Zanaflex  Macrocytic Anemia -Likely from EtOH -Hb/Hct was 11.0/30.0 -Continue to Monitor for S/Sx of Bleeding -Repeat CBC in AM  Thrombocytopenia -Patient's Platelet Count was 91,000 -Likely from EtOH and likely Splenic Sequestration -Continue to Monitor for S/Sx of  Bleding -Repeat CBC in AM   DVT prophylaxis: SCDs; Heparin 5,000 sq q8h Code Status: FULL CODE Family Communication: No family present at bedside Disposition Plan: SNF when medically stable if patient is agreeable  Consultants:   Gastroenterology   Procedures: U/S Guided Paracentesis yielding 650 mL of Serous Ascitic  Fluid    Antimicrobials:  Anti-infectives (From admission, onward)   Start     Dose/Rate Route Frequency Ordered Stop   05/03/17 2000  piperacillin-tazobactam (ZOSYN) IVPB 3.375 g     3.375 g 12.5 mL/hr over 240 Minutes Intravenous Every 8 hours 05/03/17 1851     05/03/17 0930  piperacillin-tazobactam (ZOSYN) IVPB 3.375 g     3.375 g 100 mL/hr over 30 Minutes Intravenous  Once 05/03/17 0926 05/03/17 1020   05/03/17 0930  vancomycin (VANCOCIN) IVPB 1000 mg/200 mL premix     1,000 mg 200 mL/hr over 60 Minutes Intravenous  Once 05/03/17 0926 05/03/17 1136     Subjective: Seen and examined at bedside and he was sitting in the chair feeling weak and wanting to get back in Bed. No CP or SOB. Last drink a few days ago. Awake and alert but had some tremors.   Objective: Vitals:   05/03/17 1532 05/03/17 2200 05/04/17 0359 05/04/17 1328  BP: 127/71 (!) 96/58 102/73 105/61  Pulse: (!) 132 76 (!) 118 88  Resp: 20 16 18 18   Temp: 98.1 F (36.7 C) 98.4 F (36.9 C) 97.8 F (36.6 C) 97.6 F (36.4 C)  TempSrc: Oral Oral Oral Oral  SpO2: 100% 95% 95% 98%  Weight: 103.5 kg (228 lb 2.8 oz)  104.8 kg (231 lb 0.7 oz)   Height: 5\' 10"  (1.778 m)       Intake/Output Summary (Last 24 hours) at 05/04/2017 1904 Last data filed at 05/04/2017 0957 Gross per 24 hour  Intake 440 ml  Output 725 ml  Net -285 ml   Filed Weights   05/03/17 1532 05/04/17 0359  Weight: 103.5 kg (228 lb 2.8 oz) 104.8 kg (231 lb 0.7 oz)   Examination: Physical Exam:  Constitutional: WN/WD obese Caucasian Male in NAD and appears calm and comfortable Eyes: Sclerae are icteric; Lids normal ENMT: External Ears, Nose appear normal. Grossly normal hearing. Mucous membranes are moist.  Neck: Appears normal, supple, no cervical masses, normal ROM, no appreciable thyromegaly, no JVD Respiratory: Diminished to auscultation bilaterally, no wheezing, rales, rhonchi or crackles. Normal respiratory effort and patient is not tachypenic.  No accessory muscle use.  Cardiovascular: RRR, no murmurs / rubs / gallops. S1 and S2 auscultated. 2+ LE pitting extremity [name].  Abdomen: Soft, Mildly tender, Distended with fluid and body habitus. No masses palpated. Bowel sounds positive. Has an umbilical hernia.  GU: Deferred. Musculoskeletal: No clubbing / cyanosis of digits/nails. No joint deformity upper and Gary extremities.  Skin: Severely jaundiced. Warm and dry. No appreciable rashes  Neurologic: CN 2-12 grossly intact. Has some tremors Psychiatric: Normal judgment and insight. Alert and oriented x 3. Normal mood and appropriate affect.   Data Reviewed: I have personally reviewed following labs and imaging studies  CBC: Recent Labs  Lab 05/03/17 0843 05/04/17 0749  WBC 9.9 6.9  NEUTROABS 7.2 4.7  HGB 11.1* 11.0*  HCT 29.6* 30.0*  MCV 103.1* 104.2*  PLT 115* 91*   Basic Metabolic Panel: Recent Labs  Lab 05/03/17 1535 05/03/17 1831 05/04/17 0010 05/04/17 0749 05/04/17 1212  NA 127* 126* 128* 130* 128*  K 4.5 4.9 4.0 3.7 3.5  CL  97* 98* 99* 100* 98*  CO2 19* 17* 19* 19* 20*  GLUCOSE 99 97 115* 102* 136*  BUN 35* 35* 37* 39* 35*  CREATININE 1.52* 0.99 1.34* 1.21 1.32*  CALCIUM 8.5* 8.4* 8.6* 8.6* 8.3*  MG  --   --   --  1.5*  --   PHOS  --   --   --  2.9  --    GFR: Estimated Creatinine Clearance: 65.8 mL/min (A) (by C-G formula based on SCr of 1.32 mg/dL (H)). Liver Function Tests: Recent Labs  Lab 05/03/17 0843 05/04/17 0749  AST 181* 165*  ALT 87* 78*  ALKPHOS 126 99  BILITOT 23.2* 22.7*  PROT 6.9 5.5*  ALBUMIN 2.5* 2.2*   Recent Labs  Lab 05/03/17 0843  LIPASE 133*   Recent Labs  Lab 05/03/17 0845  AMMONIA 65*   Coagulation Profile: Recent Labs  Lab 05/03/17 0843  INR 2.08   Cardiac Enzymes: Recent Labs  Lab 05/03/17 0843  CKTOTAL 135   BNP (last 3 results) No results for input(s): PROBNP in the last 8760 hours. HbA1C: No results for input(s): HGBA1C in the last 72  hours. CBG: No results for input(s): GLUCAP in the last 168 hours. Lipid Profile: No results for input(s): CHOL, HDL, LDLCALC, TRIG, CHOLHDL, LDLDIRECT in the last 72 hours. Thyroid Function Tests: No results for input(s): TSH, T4TOTAL, FREET4, T3FREE, THYROIDAB in the last 72 hours. Anemia Panel: No results for input(s): VITAMINB12, FOLATE, FERRITIN, TIBC, IRON, RETICCTPCT in the last 72 hours. Sepsis Labs: Recent Labs  Lab 05/03/17 5825 05/03/17 1535 05/03/17 1831  LATICACIDVEN 4.43* 2.2* 1.6    Recent Results (from the past 240 hour(s))  Culture, blood (routine x 2)     Status: None (Preliminary result)   Collection Time: 05/03/17  8:44 AM  Result Value Ref Range Status   Specimen Description BLOOD RIGHT ANTECUBITAL  Final   Special Requests   Final    BOTTLES DRAWN AEROBIC AND ANAEROBIC Blood Culture adequate volume Performed at [facility], [address] [place], [place] [address]    Culture   Final    NO GROWTH < 24 HOURS Performed at [facility], [address] [place], [place] [address]    Report Status PENDING  Incomplete  Culture, blood (routine x 2)     Status: None (Preliminary result)   Collection Time: 05/03/17  8:49 AM  Result Value Ref Range Status   Specimen Description BLOOD LEFT ANTECUBITAL  Final   Special Requests   Final    BOTTLES DRAWN AEROBIC AND ANAEROBIC Blood Culture adequate volume Performed at [facility], [address], [place], [place] [address]    Culture   Final    NO GROWTH < 24 HOURS Performed at [facility], [address] [place], [place] [address]    Report Status PENDING  Incomplete  Gram stain     Status: None   Collection Time: 05/03/17  2:30 PM  Result Value Ref Range Status   Specimen Description ABDOMEN FLUID  Final   Special Requests NONE  Final   Gram Stain   Final    WBC PRESENT,BOTH PMN AND MONONUCLEAR NO ORGANISMS SEEN CRITICAL RESULT CALLED TO, READ BACK  BY AND VERIFIED WITH: J.Baruch Merl, RN 05/03/17 @1938  BY V.WILKINS Performed at [facility], [address], [place], [place] [address]    Report Status 05/03/2017 FINAL  Final    Radiology Studies: Dg  Chest 2 View  Result Date: 05/03/2017 CLINICAL DATA:  Shortness of breath, nonproductive cough EXAM: CHEST  2 VIEW COMPARISON:  09/11/2016 FINDINGS: Lungs are clear.  No pleural effusion or pneumothorax. The Gary is normal in size. Visualized osseous structures are within normal limits. IMPRESSION: Normal chest radiographs. Electronically Signed   By: Charline Bills M.D.   On: 05/03/2017 09:51   Dg Pelvis 1-2 Views  Result Date: 05/03/2017 CLINICAL DATA:  Patient fell out of his chair last night. Recent right hip replacement. Complaining of pain. Right knee and ankle swelling. EXAM: PELVIS - 1-2 VIEW COMPARISON:  09/22/2016 FINDINGS: No fracture.  No bone lesion. Right total hip arthroplasty is well-seated and aligned. Left hip joint, SI joints and symphysis pubis are normally aligned. Soft tissues are unremarkable. IMPRESSION: 1. No fracture or dislocation. 2. No evidence of loosening of the right hip arthroplasty. Electronically Signed   By: Amie Portland M.D.   On: 05/03/2017 09:51   US Paracentesis  Result Date: 05/03/2017 INDICATION: History of cirrhosis, now with symptomatic ascites. Please perform ultrasound-guided paracentesis for infection source control purposes. EXAM: ULTRASOUND-GUIDED PARACENTESIS COMPARISON:  Right upper quadrant abdominal ultrasound - earlier same day MEDICATIONS: None. COMPLICATIONS: None immediate. TECHNIQUE: Informed written consent was obtained from the patient after a discussion of the risks, benefits and alternatives to treatment. A timeout was performed prior to the initiation of the procedure. Initial ultrasound scanning demonstrates a small amount of ascites within the left Gary abdominal quadrant. The left Gary abdomen was prepped and draped  in the usual sterile fashion. 1% lidocaine with epinephrine was used for local anesthesia. Under direct ultrasound guidance, a 19 gauge, 7-cm, Yueh catheter was introduced. An ultrasound image was saved for documentation purposed. The paracentesis was performed. The catheter was removed and a dressing was applied. The patient tolerated the procedure well without immediate post procedural complication. FINDINGS: A total of approximately 650 cc of serous fluid was removed. Samples were sent to the laboratory as requested by the clinical team. IMPRESSION: Successful ultrasound-guided paracentesis yielding 650 cc of peritoneal fluid. Electronically Signed   By: Simonne Come M.D.   On: 05/03/2017 14:45   US Abdomen Limited Ruq  Result Date: 05/03/2017 CLINICAL DATA:  Jaundice EXAM: ULTRASOUND ABDOMEN LIMITED RIGHT UPPER QUADRANT COMPARISON:  CT abdomen/pelvis dated 06/15/2015. FINDINGS: Gallbladder: Layering gallbladder sludge. No gallbladder wall thickening or pericholecystic fluid. Negative sonographic Murphy's sign. Common bile duct: Diameter: 5 mm Liver: Coarse hepatic echotexture with nodular hepatic contour, suggesting cirrhosis. 2.3 x 2.2 x 2.0 cm hepatic cyst. Portal vein is patent on color Doppler imaging with normal direction of blood flow towards the liver. Additional comments: Ascites. IMPRESSION: Cirrhosis.  2.3 cm hepatic cyst, benign. Ascites. Layering gallbladder sludge. Electronically Signed   By: Charline Bills M.D.   On: 05/03/2017 14:03   Scheduled Meds: . aspirin EC  325 mg Oral Daily  . folic acid  1 mg Oral Daily  . furosemide  40 mg Oral Daily  . heparin  5,000 Units Subcutaneous Q8H  . lactulose  20 g Oral TID  . liver oil-zinc oxide   Topical BID  . multivitamin with minerals  1 tablet Oral Daily  . nystatin ointment   Topical BID  . pantoprazole  40 mg Oral QAC breakfast  . propranolol  10 mg Oral Daily  . sodium chloride flush  3 mL Intravenous Q12H  . thiamine  100 mg Oral  Daily   Or  . thiamine  100 mg  Intravenous Daily   Continuous Infusions: . piperacillin-tazobactam (ZOSYN)  IV 3.375 g (05/04/17 1217)    LOS: 1 day   Merlene Laughter, DO [facility] Pager [phone]  If 7PM-7AM, please contact night-coverage [email or web address] Password [email or web address] 05/04/2017, 7:04 PM

## 2017-05-04 NOTE — Progress Notes (Signed)
RN notified PCP that the patient is refusing lab draws until 0800.

## 2017-05-04 NOTE — Evaluation (Signed)
Physical Therapy Evaluation Patient Details Name: ANWAR BAACK MRN: [id number] DOB: [birth year] Today's Date: 05/04/2017   History of Present Illness  67 y.o. male past medical history of arthritis, hypertension, reflux, alcohol abuse, lower extremity edema, heart failure presents to the emergency room with chief complaint of weakness and jaundice and admitted for Hyperbilirubinemia  Clinical Impression  Pt admitted with above diagnosis. Pt currently with functional limitations due to the deficits listed below (see PT Problem List).  Pt will benefit from skilled PT to increase their independence and safety with mobility to allow discharge to the venue listed below.  Pt reports generalized weakness and requiring assist for mobility.  Pt declines SNF at this time however SNF is recommended.  Pt is high risk for falls.  Pt only able to take a couple steps today due to fatigue and weakness.     Follow Up Recommendations Supervision/Assistance - 24 hour;SNF    Equipment Recommendations  None recommended by PT    Recommendations for Other Services       Precautions / Restrictions Precautions Precautions: Fall      Mobility  Bed Mobility Overal bed mobility: Needs Assistance Bed Mobility: Supine to Sit     Supine to sit: Min assist;HOB elevated     General bed mobility comments: verbal cues for technique, assist to scoot to EOB  Transfers Overall transfer level: Needs assistance Equipment used: Rolling walker (2 wheeled) Transfers: Sit to/from Stand Sit to Stand: Min assist         General transfer comment: assist to rise and steady  Ambulation/Gait Ambulation/Gait assistance: Min assist Ambulation Distance (Feet): 3 Feet Assistive device: Rolling walker (2 wheeled) Gait Pattern/deviations: Step-through pattern;Decreased stride length     General Gait Details: pt initiated gait however after a few steps stated "I can't do anymore"  so recliner pulled behind pt; reports  weakness limiting  Stairs            Wheelchair Mobility    Modified Rankin (Stroke Patients Only)       Balance Overall balance assessment: Needs assistance;History of Falls         Standing balance support: Bilateral upper extremity supported Standing balance-Leahy Scale: Poor                               Pertinent Vitals/Pain Pain Assessment: No/denies pain    Home Living Family/patient expects to be discharged to:: Private residence Living Arrangements: Spouse/significant other Available Help at Discharge: Family;Available 24 hours/day   Home Access: Stairs to enter Entrance Stairs-Rails: None Entrance Stairs-Number of Steps: 1 Home Layout: One level Home Equipment: Walker - 2 wheels;Cane - single point;Bedside commode      Prior Function Level of Independence: Independent with assistive device(s)         Comments: Uses cane at baseline; reports he was independent/mod independent with ADLs      Hand Dominance        Extremity/Trunk Assessment        Lower Extremity Assessment Lower Extremity Assessment: Generalized weakness(bil UE tremors with tasks)       Communication   Communication: No difficulties  Cognition Arousal/Alertness: Awake/alert Behavior During Therapy: Flat affect Overall Cognitive Status: Within Functional Limits for tasks assessed  General Comments      Exercises     Assessment/Plan    PT Assessment Patient needs continued PT services  PT Problem List Decreased strength;Decreased mobility;Decreased activity tolerance;Decreased balance;Decreased knowledge of use of DME       PT Treatment Interventions DME instruction;Therapeutic activities;Gait training;Therapeutic exercise;Patient/family education;Functional mobility training;Balance training    PT Goals (Current goals can be found in the Care Plan section)  Acute Rehab PT Goals PT Goal  Formulation: With patient Time For Goal Achievement: 05/18/17 Potential to Achieve Goals: Fair    Frequency Min 3X/week   Barriers to discharge        Co-evaluation               AM-PAC PT "6 Clicks" Daily Activity  Outcome Measure Difficulty turning over in bed (including adjusting bedclothes, sheets and blankets)?: A Lot Difficulty moving from lying on back to sitting on the side of the bed? : Unable Difficulty sitting down on and standing up from a chair with arms (e.g., wheelchair, bedside commode, etc,.)?: Unable Help needed moving to and from a bed to chair (including a wheelchair)?: A Little Help needed walking in hospital room?: A Lot Help needed climbing 3-5 steps with a railing? : Total 6 Click Score: 10    End of Session Equipment Utilized During Treatment: Gait belt Activity Tolerance: Patient limited by fatigue Patient left: in chair;with call bell/phone within reach Nurse Communication: Mobility status(NT aware pt up to recliner, pt aware to call for assist) PT Visit Diagnosis: Unsteadiness on feet (R26.81);Difficulty in walking, not elsewhere classified (R26.2)    Time: 4098-1191 PT Time Calculation (min) (ACUTE ONLY): 16 min   Charges:   PT Evaluation $PT Eval Low Complexity: 1 Low     PT G Codes:       Zenovia Jarred, PT, DPT 05/04/2017 Pager: [phone]  Maida Sale E 05/04/2017, 12:26 PM

## 2017-05-05 ENCOUNTER — Inpatient Hospital Stay (HOSPITAL_COMMUNITY): Payer: Medicare HMO

## 2017-05-05 LAB — COMPREHENSIVE METABOLIC PANEL
ALBUMIN: 2.1 g/dL — AB (ref 3.5–5.0)
ALT: 80 U/L — ABNORMAL HIGH (ref 17–63)
ANION GAP: 9 (ref 5–15)
AST: 158 U/L — ABNORMAL HIGH (ref 15–41)
Alkaline Phosphatase: 97 U/L (ref 38–126)
BUN: 31 mg/dL — ABNORMAL HIGH (ref 6–20)
CHLORIDE: 102 mmol/L (ref 101–111)
CO2: 20 mmol/L — AB (ref 22–32)
Calcium: 8.4 mg/dL — ABNORMAL LOW (ref 8.9–10.3)
Creatinine, Ser: 0.94 mg/dL (ref 0.61–1.24)
GFR calc Af Amer: >60 mL/min (ref >60)
GFR calc non Af Amer: >60 mL/min (ref >60)
GLUCOSE: 110 mg/dL — AB (ref 65–99)
Potassium: 3.5 mmol/L (ref 3.5–5.1)
SODIUM: 131 mmol/L — AB (ref 135–145)
Total Bilirubin: 22.6 mg/dL (ref 0.3–1.2)
Total Protein: 5.3 g/dL — ABNORMAL LOW (ref 6.5–8.1)

## 2017-05-05 LAB — CBC WITH DIFFERENTIAL/PLATELET
BASOS PCT: 0 %
Basophils Absolute: 0 10*3/uL (ref 0.0–0.1)
EOS ABS: 0.1 10*3/uL (ref 0.0–0.7)
EOS PCT: 1 %
HCT: 30 % — ABNORMAL LOW (ref 39.0–52.0)
Hemoglobin: 11.1 g/dL — ABNORMAL LOW (ref 13.0–17.0)
LYMPHS ABS: 1.3 10*3/uL (ref 0.7–4.0)
Lymphocytes Relative: 17 %
MCH: 38.5 pg — AB (ref 26.0–34.0)
MCHC: 37 g/dL — AB (ref 30.0–36.0)
MCV: 104.2 fL — ABNORMAL HIGH (ref 78.0–100.0)
MONOS PCT: 21 %
Monocytes Absolute: 1.6 10*3/uL — ABNORMAL HIGH (ref 0.1–1.0)
Neutro Abs: 4.5 10*3/uL (ref 1.7–7.7)
Neutrophils Relative %: 61 %
PLATELETS: 92 10*3/uL — AB (ref 150–400)
RBC: 2.88 MIL/uL — ABNORMAL LOW (ref 4.22–5.81)
RDW: 17.7 % — ABNORMAL HIGH (ref 11.5–15.5)
WBC: 7.5 10*3/uL (ref 4.0–10.5)

## 2017-05-05 LAB — PHOSPHORUS: PHOSPHORUS: 2.7 mg/dL (ref 2.5–4.6)

## 2017-05-05 LAB — PROTIME-INR
INR: 2.18
PROTHROMBIN TIME: 24.1 s — AB (ref 11.4–15.2)

## 2017-05-05 LAB — MAGNESIUM: Magnesium: 1.8 mg/dL (ref 1.7–2.4)

## 2017-05-05 NOTE — [place] FL2 (Signed)
[place] MEDICAID FL2 LEVEL OF CARE SCREENING TOOL     IDENTIFICATION  Patient Name: Gary [name] Birthdate: 02/05/1951 Sex: male Admission Date (Current Location): 05/03/2017  [facility] and [place] Number:  Producer, television/film/video and Address:  [facility],  [address] [place], [place] [address]      Provider Number: [id number]  Attending Physician Name and Address:  Merlene Laughter, DO  Relative Name and Phone Number:       Current Level of Care: Hospital Recommended Level of Care: Skilled Nursing Facility Prior Approval Number:    Date Approved/Denied:   PASRR Number: [id number]  Discharge Plan: SNF    Current Diagnoses: Patient Active Problem List   Diagnosis Date Noted  . Jaundice   . Encephalopathy, hepatic (HCC)   . AKI (acute kidney injury) (HCC) 05/03/2017  . Primary localized osteoarthritis of right hip 09/22/2016  . Abnormal EKG 09/17/2016  . Left anterior fascicular block (LAFB) 09/17/2016  . Cardiomyopathy (HCC) 09/17/2016  . Preoperative cardiovascular examination 09/17/2016  . Ectopic atrial rhythm 09/17/2016  . Arthritis of right hip 09/12/2016    Class: Chronic  . Osteoarthritis of right hip 11/10/2015  . Chronic pain of right hip 11/09/2015  . Alcoholic encephalopathy (HCC) 56/21/3086  . Vitamin D deficiency 06/28/2015  . Abnormal echocardiography   . Alcoholic hepatitis   . Altered mental status 06/15/2015  . Hepatic cirrhosis (HCC) 06/15/2015  . Elevated troponin 06/15/2015  . Elevated CK 06/15/2015  . Fall 06/15/2015  . Maxillary sinus fracture (HCC) 04/05/2015  . Elevated LFTs 11/21/2014  . Inflammatory liver disease 11/21/2014  . Alcohol abuse 11/21/2014  . Hypokalemia   . Hypomagnesemia   . Acute encephalopathy   . Increased liver enzymes   . Hypertensive heart disease 11/10/2014  . Pure hypercholesterolemia 10/19/2014  . Polyarthritis of multiple sites   . Esophageal reflux   . Alcoholic cirrhosis (HCC)   .  Shortness of breath 04/25/2014  . History of alcohol abuse 04/25/2014  . Lower extremity edema 04/25/2014  . Ascites 04/25/2014  . Rash 04/25/2014  . Left ankle pain 04/25/2014  . Hyponatremia   . Hyperbilirubinemia 03/01/2014  . Macrocytic anemia 03/01/2014  . Thrombocytopenia (HCC) 03/01/2014  . Alcohol dependence (HCC) 02/28/2014  . Alcoholic hepatitis without ascites 02/25/2014  . Acute hepatitis 02/25/2014    Orientation RESPIRATION BLADDER Height & Weight     Self, Time, Situation, Place  Normal Incontinent Weight: 231 lb 0.7 oz (104.8 kg) Height:  5\' 10"  (177.8 cm)  BEHAVIORAL SYMPTOMS/MOOD NEUROLOGICAL BOWEL NUTRITION STATUS      Incontinent Diet(Heart)  AMBULATORY STATUS COMMUNICATION OF NEEDS Skin   Limited Assist Verbally Normal                       Personal Care Assistance Level of Assistance  Bathing, Feeding, Dressing Bathing Assistance: Maximum assistance Feeding assistance: Independent Dressing Assistance: Maximum assistance     Functional Limitations Info  Sight, Hearing, Speech Sight Info: Adequate Hearing Info: Adequate Speech Info: Adequate    SPECIAL CARE FACTORS FREQUENCY  PT (By licensed PT), OT (By licensed OT)     PT Frequency: 5x/week OT Frequency: 5x/week            Contractures Contractures Info: Not present    Additional Factors Info  Code Status, Allergies Code Status Info: Full Code Allergies Info: Ace Inhibitors           Current Medications (05/05/2017):  This is the  current hospital active medication list Current Facility-Administered Medications  Medication Dose Route Frequency Provider Last Rate Last Dose  . acetaminophen (TYLENOL) tablet 650 mg  650 mg Oral Q6H PRN Haydee Salter, MD       Or  . acetaminophen (TYLENOL) suppository 650 mg  650 mg Rectal Q6H PRN Haydee Salter, MD      . aspirin EC tablet 325 mg  325 mg Oral Daily Haydee Salter, MD   325 mg at 05/05/17 0831  . folic acid (FOLVITE) tablet  1 mg  1 mg Oral Daily Haydee Salter, MD   1 mg at 05/05/17 0830  . furosemide (LASIX) tablet 40 mg  40 mg Oral Daily Haydee Salter, MD   40 mg at 05/05/17 0831  . heparin injection 5,000 Units  5,000 Units Subcutaneous Q8H Haydee Salter, MD   5,000 Units at 05/05/17 [phone]  . lactulose (CHRONULAC) 10 GM/15ML solution 30 g  30 g Oral TID Marsa Aris V, MD   30 g at 05/05/17 0830  . liver oil-zinc oxide (DESITIN) 40 % ointment   Topical BID Haydee Salter, MD      . LORazepam (ATIVAN) tablet 1 mg  1 mg Oral Q6H PRN Haydee Salter, MD   1 mg at 05/04/17 1307   Or  . LORazepam (ATIVAN) injection 1 mg  1 mg Intravenous Q6H PRN Haydee Salter, MD      . multivitamin with minerals tablet 1 tablet  1 tablet Oral Daily Haydee Salter, MD   1 tablet at 05/05/17 0831  . nystatin ointment (MYCOSTATIN)   Topical BID Haydee Salter, MD      . ondansetron [facility]) tablet 4 mg  4 mg Oral Q6H PRN Haydee Salter, MD       Or  . ondansetron [facility]) injection 4 mg  4 mg Intravenous Q6H PRN Haydee Salter, MD      . pantoprazole (PROTONIX) EC tablet 40 mg  40 mg Oral QAC breakfast Haydee Salter, MD   40 mg at 05/05/17 0830  . piperacillin-tazobactam (ZOSYN) IVPB 3.375 g  3.375 g Intravenous Q8H Haydee Salter, MD 12.5 mL/hr at 05/05/17 1232 3.375 g at 05/05/17 1232  . propranolol (INDERAL) tablet 10 mg  10 mg Oral Daily Haydee Salter, MD   10 mg at 05/05/17 0831  . sodium chloride flush (NS) 0.9 % injection 3 mL  3 mL Intravenous Q12H Haydee Salter, MD   3 mL at 05/05/17 0258  . thiamine (VITAMIN B-1) tablet 100 mg  100 mg Oral Daily Haydee Salter, MD   100 mg at 05/05/17 0830   Or  . thiamine (B-1) injection 100 mg  100 mg Intravenous Daily Haydee Salter, MD         Discharge Medications: Please see discharge summary for a list of discharge medications.  Relevant Imaging Results:  Relevant Lab Results:   Additional Information SSN: [id number]  Naiomi Musto, LCSW

## 2017-05-05 NOTE — Clinical Social Work Note (Signed)
Clinical Social Work Assessment  Patient Details  Name: Gary [name] MRN: [id number] Date of Birth: 05-Aug-1950  Date of referral:  05/05/17               Reason for consult:  Facility Placement                Permission sought to share information with:  Facility Medical sales representative, Family Supports Permission granted to share information::  Yes, Verbal Permission Granted  Name::     Alric Seton  Agency::     Relationship::  Wife  Contact Information:  [phone]  Housing/Transportation Living arrangements for the past 2 months:  Apartment Source of Information:  Patient Patient Interpreter Needed:  None Criminal Activity/Legal Involvement Pertinent to Current Situation/Hospitalization:  No - Comment as needed Significant Relationships:  Spouse Lives with:  Spouse Do you feel safe going back to the place where you live?  (PT recommending SNF) Need for family participation in patient care:  No (Coment)  Care giving concerns:  Patient from home with wife. Patient reported that prior to hospitalization he was having issues with his motor skills and walking. Patient reported that he completed ADLs independently and used a walker to assist with ambulating. PT recommending SNF.   Social Worker assessment / plan:  CSW spoke with patient at bedside regarding PT recommendation for 24 hour supervision/SNF. Patient reported that he is agreeable if insurance covers SNF. Patient reported that he is retired and receives SSI. CSW explained SNF placement process and insurance authorization. Patient reported that he prefers a SNF close to his home. Patient granted CSW verbal permission to speak with his wife.  CSW completed FL2 and will follow up with bed offers.  CSW will continue to follow and assist with discharge planning.  Employment status:  Retired Database administrator PT Recommendations:  Skilled Holiday representative, 24 Hour Supervision Information / Referral to  community resources:  Skilled Nursing Facility  Patient/Family's Response to care:  Patient appreciative of CSW assistance with discharge planning. Patient reported that he is feeling better.   Patient/Family's Understanding of and Emotional Response to Diagnosis, Current Treatment, and Prognosis:  Patient presented calm with minimal speech. Patient verbalized plan to dc to SNF for rehab if insurance covers it. CSW provided psychoeducation about SNF placement process and insurance authorization for SNF.   Emotional Assessment Appearance:  Appears stated age Attitude/Demeanor/Rapport:  Other(Cooperative) Affect (typically observed):  Calm Orientation:  Oriented to Self, Oriented to Place, Oriented to  Time, Oriented to Situation Alcohol / Substance use:  Alcohol Use Psych involvement (Current and /or in the community):  No (Comment)  Discharge Needs  Concerns to be addressed:  Care Coordination Readmission within the last 30 days:  No Current discharge risk:  Physical Impairment Barriers to Discharge:  Continued Medical Work up   [employer], LCSW 05/05/2017, 2:04 PM

## 2017-05-05 NOTE — Plan of Care (Signed)
  Progressing Education: Knowledge of General Education information will improve 05/05/2017 0955 - Progressing by Haifa Hatton, Alben Spittle, RN Health Behavior/Discharge Planning: Ability to manage health-related needs will improve 05/05/2017 0955 - Progressing by Matvey Llanas, Alben Spittle, RN Clinical Measurements: Ability to maintain clinical measurements within normal limits will improve 05/05/2017 0955 - Progressing by Rubert Frediani, Alben Spittle, RN Will remain free from infection 05/05/2017 0955 - Progressing by Abdulla Pooley, Alben Spittle, RN Diagnostic test results will improve 05/05/2017 0955 - Progressing by Amanpreet Delmont, Alben Spittle, RN Respiratory complications will improve 05/05/2017 0955 - Progressing by Veva Grimley, Alben Spittle, RN Cardiovascular complication will be avoided 05/05/2017 0955 - Progressing by Kevia Zaucha, Alben Spittle, RN Activity: Risk for activity intolerance will decrease 05/05/2017 0955 - Progressing by Shalva Rozycki, Alben Spittle, RN Elimination: Will not experience complications related to bowel motility 05/05/2017 0955 - Progressing by Valerio Pinard, Alben Spittle, RN Will not experience complications related to urinary retention 05/05/2017 0955 - Progressing by Devario Bucklew, Alben Spittle, RN Pain Managment: General experience of comfort will improve 05/05/2017 0955 - Progressing by Samule Life, Alben Spittle, RN Skin Integrity: Risk for impaired skin integrity will decrease 05/05/2017 0955 - Progressing by Jacksen Isip, Alben Spittle, RN

## 2017-05-05 NOTE — Progress Notes (Signed)
PROGRESS NOTE    Gary [name]  NFA:[id number] DOB: 1950/06/12 DOA: 05/03/2017 PCP: [name], NP   Brief Narrative:  Gary [name] is a 67 y.o. male past medical history of arthritis, hypertension, reflux, alcohol abuse, lower extremity edema, heart failure presents to the emergency room with chief complaint of weakness and jaundice.  Patient states that he has been feeling weaker over the last 4 weeks.  He is also had several weeks of red painful scrotum and red irritated gluteal cleft.  He did not seek medical attention for these issues.  Over the last week he has been eating and drinking less due to decreased appetite.  Patient also had 3 weeks where he vomits 1-2 times every morning, small volume, nonbloody nonbilious.  Patient states that he still drinks.  Last drink was last night. States that his wife told him on Friday his eyes were turning yellow.  Patient normally walks with a cane but says he is not weak.  Got into his recliner and states that the chair slid him into the floor when he tried to sit up.  He was able to call a neighbor for help.  The neighbor helped him off the floor. Patient then came to the emergency room for evaluation.  EDP consulted Gastroenterology due to elevated bilirubin.  Hospitalist consulted for admission and patient currently being treated for Decompensated Alcoholic Liver Cirrhosis and Alcoholic Hepatitis.   Assessment & Plan:   Principal Problem:   Hyperbilirubinemia Active Problems:   Hyponatremia   AKI (acute kidney injury) (HCC)   Jaundice   Encephalopathy, hepatic (HCC)  Acute Decompensated Alcoholic Liver Cirrhosis -Maddrey Discriminant Function Score was >60; Defer to Gastroenterology to start Prednisolone when Active Infection is r/o'd out -C/w Furosemide 40 mg po Daily; Spironolactone held currently  -Underwent Paracentesis to exclude SBP; Cytology pending  -C/w Zosyn for SBP Prophylaxis; WBC in Pleural Fluid was 181 -C/w Propanolol 10 mg  po Daily  -GI checking AFP and will need eventual imaging for HCC Survelliance; GI Getting Whole Abdominal U/S even though RUQ was done on admission  -Counseled on EtOH Abuse  Alcohol Abuse /Concern for Withdrawal -Last Drink was 05/02/17 -Had Tremors today and some confusion but tremors slightly improved -C/w CIWA Protocol with Lorazepam 1 mg po/IV -C/w Folic Acid 1 mg po Daily, MVI, and Thiamine   Alcoholic Hepatitis -As Above -AST went from 165 -> 158 and ALT went from 78 -> 80 -GI Following and holding on starting Prednisolone until active infection r/o'd  Hyperammonemia -Patient's Ammonia Level was 65; Repeat in AM  -Lactulose 20 g TID Daily increased to 30 mg TID Daily   Hyperbilirubinemia  -Patient's Total Bilirubin was 23.2 and went to 22.7 -> 22.6 -GI Consulted by EDP -RUQ U/S ordered and showed Cirrhosis.  2.3 cm hepatic cyst, benign. Ascites. Layering gallbladder sludge. -GI repeating U/S and getting Whole Abdominal U/S -GI ordered Paracentesis and was done by IR  Hyponatremia -Likely 2/2 Beer Potomania -Sodium was 128 and improved to 131 -Continue to Monitor   Lactic Acidosis  -Likely due to dehydration & ETOH use -Improved as LA went from 4.43 -> 2.2 -> 1.6  Scrotal Fungal Skin infection -C/w Nystatin Ointment and with Desitin topically BID -Scrotal Sling Support  HTN -C/w Propanol 10 mg po Daily   Generalized Weakness -PT Evaluation recommending SNF and wife wants SNF as patient has EtOH Access at home -Social Work consulted for SNF Placement   CAD -C/w ASA 325 mg  po Daily   AKI -Baseline Cr <1.0, Cr on admit 1.77 -2L of normal saline given in the emergency room -Gentle hydration overnight now stopped -Patient's po Lasix 40 mg restarted  -Patient's BUN/Cr went from 37/1.34 -> 39/1.21 -> 35/1.32 -> 31/0.94  GERD -C/w Pantoprazole 40 mg po Daily   Chronic Pain -Continue to hold Zanaflex  Macrocytic Anemia -Likely from EtOH -Hb/Hct was  11.1/30.0 -Continue to Monitor for S/Sx of Bleeding -Repeat CBC in AM  Thrombocytopenia -Patient's Platelet Count was 92,000 -Likely from EtOH and likely Splenic Sequestration -Continue to Monitor for S/Sx of Bleding -Repeat CBC in AM   DVT prophylaxis: SCDs; Heparin 5,000 sq q8h Code Status: FULL CODE Family Communication: No family present at bedside Disposition Plan: SNF when medically stable if patient is agreeable  Consultants:   Gastroenterology   Procedures: U/S Guided Paracentesis yielding 650 mL of Serous Ascitic Fluid    Antimicrobials:  Anti-infectives (From admission, onward)   Start     Dose/Rate Route Frequency Ordered Stop   05/03/17 2000  piperacillin-tazobactam (ZOSYN) IVPB 3.375 g     3.375 g 12.5 mL/hr over 240 Minutes Intravenous Every 8 hours 05/03/17 1851     05/03/17 0930  piperacillin-tazobactam (ZOSYN) IVPB 3.375 g     3.375 g 100 mL/hr over 30 Minutes Intravenous  Once 05/03/17 0926 05/03/17 1020   05/03/17 0930  vancomycin (VANCOCIN) IVPB 1000 mg/200 mL premix     1,000 mg 200 mL/hr over 60 Minutes Intravenous  Once 05/03/17 0926 05/03/17 1136     Subjective: Seen and examined at bedside and felt slightly better but was still raw on his scrotum and buttock. Nursing stating he was having a lot of diarrhea and loose stools (likely EtOH withdrawls). No CP or SOB and no complaints.   Objective: Vitals:   05/04/17 1328 05/04/17 2254 05/05/17 0435 05/05/17 1358  BP: 105/61 118/69 99/80 103/63  Pulse: 88 86 78 75  Resp: 18 20 18 18   Temp: 97.6 F (36.4 C) 97.7 F (36.5 C) 98 F (36.7 C) 97.9 F (36.6 C)  TempSrc: Oral Oral Oral Oral  SpO2: 98% 98% 96% 96%  Weight:      Height:        Intake/Output Summary (Last 24 hours) at 05/05/2017 1826 Last data filed at 05/05/2017 1700 Gross per 24 hour  Intake 580 ml  Output 100 ml  Net 480 ml   Filed Weights   05/03/17 1532 05/04/17 0359  Weight: 103.5 kg (228 lb 2.8 oz) 104.8 kg (231 lb 0.7 oz)    Examination: Physical Exam:  Constitutional: WN/WD obese Caucasian Male laying in bed in NAD and appears calm Eyes: Sclerae severely icteric. Lids normal ENMT: External Ears and nose appear normal. Grossly normal hearing Neck: Supple with no JVD; External Ears and nose appear normal Respiratory: Diminished to auscultation bilaterally; No appreciable wheezing/rales/ronchi. Unlabored breathing  Cardiovascular: RRR; no m/r/g. Has 2+ LE pitting edema Abdomen: Soft, Not as tender today; Distended with Fluid and has an umbilical hernia.  GU: Deferred Musculoskeletal: No contractures; No cyanosis Skin: Severely jaundiced hue; Warm and dry; Had skin chaffing and erythema around scrotum Neurologic: CN 2-12 grossly intact. Had mild tremors today Psychiatric: Normal mood and affect. Awake and alert  Data Reviewed: I have personally reviewed following labs and imaging studies  CBC: Recent Labs  Lab 05/03/17 0843 05/04/17 0749 05/05/17 0440  WBC 9.9 6.9 7.5  NEUTROABS 7.2 4.7 4.5  HGB 11.1* 11.0* 11.1*  HCT 29.6*  30.0* 30.0*  MCV 103.1* 104.2* 104.2*  PLT 115* 91* 92*   Basic Metabolic Panel: Recent Labs  Lab 05/03/17 1831 05/04/17 0010 05/04/17 0749 05/04/17 1212 05/05/17 0440  NA 126* 128* 130* 128* 131*  K 4.9 4.0 3.7 3.5 3.5  CL 98* 99* 100* 98* 102  CO2 17* 19* 19* 20* 20*  GLUCOSE 97 115* 102* 136* 110*  BUN 35* 37* 39* 35* 31*  CREATININE 0.99 1.34* 1.21 1.32* 0.94  CALCIUM 8.4* 8.6* 8.6* 8.3* 8.4*  MG  --   --  1.5*  --  1.8  PHOS  --   --  2.9  --  2.7   GFR: Estimated Creatinine Clearance: 92.4 mL/min (by C-G formula based on SCr of 0.94 mg/dL). Liver Function Tests: Recent Labs  Lab 05/03/17 0843 05/04/17 0749 05/05/17 0440  AST 181* 165* 158*  ALT 87* 78* 80*  ALKPHOS 126 99 97  BILITOT 23.2* 22.7* 22.6*  PROT 6.9 5.5* 5.3*  ALBUMIN 2.5* 2.2* 2.1*   Recent Labs  Lab 05/03/17 0843  LIPASE 133*   Recent Labs  Lab 05/03/17 0845  AMMONIA 65*    Coagulation Profile: Recent Labs  Lab 05/03/17 0843 05/05/17 1046  INR 2.08 2.18   Cardiac Enzymes: Recent Labs  Lab 05/03/17 0843  CKTOTAL 135   BNP (last 3 results) No results for input(s): PROBNP in the last 8760 hours. HbA1C: No results for input(s): HGBA1C in the last 72 hours. CBG: No results for input(s): GLUCAP in the last 168 hours. Lipid Profile: No results for input(s): CHOL, HDL, LDLCALC, TRIG, CHOLHDL, LDLDIRECT in the last 72 hours. Thyroid Function Tests: No results for input(s): TSH, T4TOTAL, FREET4, T3FREE, THYROIDAB in the last 72 hours. Anemia Panel: No results for input(s): VITAMINB12, FOLATE, FERRITIN, TIBC, IRON, RETICCTPCT in the last 72 hours. Sepsis Labs: Recent Labs  Lab 05/03/17 3154 05/03/17 1535 05/03/17 1831  LATICACIDVEN 4.43* 2.2* 1.6    Recent Results (from the past 240 hour(s))  Culture, blood (routine x 2)     Status: None (Preliminary result)   Collection Time: 05/03/17  8:44 AM  Result Value Ref Range Status   Specimen Description BLOOD RIGHT ANTECUBITAL  Final   Special Requests   Final    BOTTLES DRAWN AEROBIC AND ANAEROBIC Blood Culture adequate volume Performed at [facility], [address] [place], [place] [address]    Culture   Final    NO GROWTH 2 DAYS Performed at [facility], [address], [place], [place] [address]    Report Status PENDING  Incomplete  Culture, blood (routine x 2)     Status: None (Preliminary result)   Collection Time: 05/03/17  8:49 AM  Result Value Ref Range Status   Specimen Description BLOOD LEFT ANTECUBITAL  Final   Special Requests   Final    BOTTLES DRAWN AEROBIC AND ANAEROBIC Blood Culture adequate volume Performed at [facility], [address], [place], [place] [address]    Culture   Final    NO GROWTH 2 DAYS Performed at [facility], [address], [place], [place] [address]    Report Status PENDING  Incomplete  Gram  stain     Status: None   Collection Time: 05/03/17  2:30 PM  Result Value Ref Range Status   Specimen Description ABDOMEN FLUID  Final   Special Requests NONE  Final   Gram Stain   Final    WBC PRESENT,BOTH  PMN AND MONONUCLEAR NO ORGANISMS SEEN CRITICAL RESULT CALLED TO, READ BACK BY AND VERIFIED WITH: J.Baruch Merl, RN 05/03/17 @1938  BY V.WILKINS Performed at [facility], [address], [place], [place] [address]    Report Status 05/03/2017 FINAL  Final    Radiology Studies: No results found. Scheduled Meds: . aspirin EC  325 mg Oral Daily  . folic acid  1 mg Oral Daily  . furosemide  40 mg Oral Daily  . heparin  5,000 Units Subcutaneous Q8H  . lactulose  30 g Oral TID  . liver oil-zinc oxide   Topical BID  . multivitamin with minerals  1 tablet Oral Daily  . nystatin ointment   Topical BID  . pantoprazole  40 mg Oral QAC breakfast  . propranolol  10 mg Oral Daily  . sodium chloride flush  3 mL Intravenous Q12H  . thiamine  100 mg Oral Daily   Or  . thiamine  100 mg Intravenous Daily   Continuous Infusions: . piperacillin-tazobactam (ZOSYN)  IV Stopped (05/05/17 1622)    LOS: 2 days   Merlene Laughter, DO [facility] Pager [phone]  If 7PM-7AM, please contact night-coverage [email or web address] Password [facility] 05/05/2017, 6:26 PM

## 2017-05-05 NOTE — Progress Notes (Addendum)
Progress Note   Subjective  Chief Complaint: Alcoholic cirrhosis/hepatitis  Patient found laying in bed today, his wife is by his side.  She tells me that he has access to alcohol from a lot of different people in his life and she would like him to go to a nursing facility if he is ever able to leave the hospital prior to coming home.  Patient denies any new complaints today in fact feels "somewhat better".   Objective   Vital signs in last 24 hours: Temp:  [97.6 F (36.4 C)-98 F (36.7 C)] 98 F (36.7 C) (03/05 0435) Pulse Rate:  [78-88] 78 (03/05 0435) Resp:  [18-20] 18 (03/05 0435) BP: (99-118)/(61-80) 99/80 (03/05 0435) SpO2:  [96 %-98 %] 96 % (03/05 0435) Last BM Date: 05/04/17 General:    Jaundiced Caucasian male in NAD Heart:  Regular rate and rhythm; no murmurs Lungs: Respirations even and unlabored, lungs CTA bilaterally Abdomen:  Soft, nontender and nondistended. Normal bowel sounds. Extremities:  Without edema. Neurologic:  Alert and oriented,  grossly normal neurologically. Psych:  Cooperative. Normal mood and affect.  Intake/Output from previous day: 03/04 0701 - 03/05 0700 In: 150 [IV Piggyback:150] Out: 425 [Urine:425] Intake/Output this shift: Total I/O In: 120 [P.O.:120] Out: -   Lab Results: Recent Labs    05/03/17 0843 05/04/17 0749 05/05/17 0440  WBC 9.9 6.9 7.5  HGB 11.1* 11.0* 11.1*  HCT 29.6* 30.0* 30.0*  PLT 115* 91* 92*   BMET Recent Labs    05/04/17 0749 05/04/17 1212 05/05/17 0440  NA 130* 128* 131*  K 3.7 3.5 3.5  CL 100* 98* 102  CO2 19* 20* 20*  GLUCOSE 102* 136* 110*  BUN 39* 35* 31*  CREATININE 1.21 1.32* 0.94  CALCIUM 8.6* 8.3* 8.4*   LFT Recent Labs    05/04/17 0749 05/05/17 0440  PROT 5.5* 5.3*  ALBUMIN 2.2* 2.1*  AST 165* 158*  ALT 78* 80*  ALKPHOS 99 97  BILITOT 22.7* 22.6*  BILIDIR 13.2*  --   IBILI 9.5*  --    PT/INR Recent Labs    05/03/17 0843 05/05/17 1046  LABPROT 23.2* 24.1*  INR 2.08  2.18    Studies/Results: US Paracentesis  Result Date: 05/03/2017 INDICATION: History of cirrhosis, now with symptomatic ascites. Please perform ultrasound-guided paracentesis for infection source control purposes. EXAM: ULTRASOUND-GUIDED PARACENTESIS COMPARISON:  Right upper quadrant abdominal ultrasound - earlier same day MEDICATIONS: None. COMPLICATIONS: None immediate. TECHNIQUE: Informed written consent was obtained from the patient after a discussion of the risks, benefits and alternatives to treatment. A timeout was performed prior to the initiation of the procedure. Initial ultrasound scanning demonstrates a small amount of ascites within the left lower abdominal quadrant. The left lower abdomen was prepped and draped in the usual sterile fashion. 1% lidocaine with epinephrine was used for local anesthesia. Under direct ultrasound guidance, a 19 gauge, 7-cm, Yueh catheter was introduced. An ultrasound image was saved for documentation purposed. The paracentesis was performed. The catheter was removed and a dressing was applied. The patient tolerated the procedure well without immediate post procedural complication. FINDINGS: A total of approximately 650 cc of serous fluid was removed. Samples were sent to the laboratory as requested by the clinical team. IMPRESSION: Successful ultrasound-guided paracentesis yielding 650 cc of peritoneal fluid. Electronically Signed   By: Simonne Come M.D.   On: 05/03/2017 14:45   US Abdomen Limited Ruq  Result Date: 05/03/2017 CLINICAL DATA:  Jaundice EXAM: ULTRASOUND ABDOMEN LIMITED  RIGHT UPPER QUADRANT COMPARISON:  CT abdomen/pelvis dated 06/15/2015. FINDINGS: Gallbladder: Layering gallbladder sludge. No gallbladder wall thickening or pericholecystic fluid. Negative sonographic Murphy's sign. Common bile duct: Diameter: 5 mm Liver: Coarse hepatic echotexture with nodular hepatic contour, suggesting cirrhosis. 2.3 x 2.2 x 2.0 cm hepatic cyst. Portal vein is patent on  color Doppler imaging with normal direction of blood flow towards the liver. Additional comments: Ascites. IMPRESSION: Cirrhosis.  2.3 cm hepatic cyst, benign. Ascites. Layering gallbladder sludge. Electronically Signed   By: Charline Bills M.D.   On: 05/03/2017 14:03       Assessment / Plan:   Assessment: 1.  Alcoholic cirrhosis with hepatitis and decompensation: Meld 32, discriminant function score greater than 32, jaundice with a bilirubin of 23-->22.6 today, paracentesis 05/03/17 with removal of 650 cc of serous fluid 2.  AK I 3.  Scrotal erythema  Plan: 1.  Cytology pending after paracentesis 05/03/17 2.  Again will need eventual EGD to screen for varices 3.  Ultrasound with Doppler ordered today 4.  Continue lactulose 30 g 3 times daily 5.  Continue broad-spectrum antibiotics 6.  Again holding off on prednisolone until it is confirmed he has no active infection 7.  Please await a final recommendations from Dr. Lavon Paganini later today  Thank you for your kind consultation, we will continue to follow   LOS: 2 days   Unk Lightning  05/05/2017, 12:00 PM  Pager # [phone]   Attending physician's note   I have taken an interval history, reviewed the chart and examined the patient. I agree with the Advanced Practitioner's note, impression and recommendations.  Will f/u abdominal ultrasound with doppler Plan for EGD prior to discharge once patient is stable from alcohol withdrawal perspective for esophageal varices screening. No evidence of active GI bleed.  Continue Lactulose 30gm TID Decompensated alcoholic cirrhosis with acute alcoholic hepatitis: overall poor prognosis with high risk for mortality Continue supportive care  K Scherry Ran, MD [phone] Mon-Fri 8a-5p [phone] after 5p, weekends, holidays

## 2017-05-06 DIAGNOSIS — I498 Other specified cardiac arrhythmias: Secondary | ICD-10-CM

## 2017-05-06 LAB — GLUCOSE, CAPILLARY
GLUCOSE-CAPILLARY: 131 mg/dL — AB (ref 65–99)
Glucose-Capillary: 126 mg/dL — ABNORMAL HIGH (ref 65–99)

## 2017-05-06 LAB — CBC WITH DIFFERENTIAL/PLATELET
Basophils Absolute: 0 10*3/uL (ref 0.0–0.1)
Basophils Relative: 1 %
EOS ABS: 0.1 10*3/uL (ref 0.0–0.7)
EOS PCT: 2 %
HCT: 30.1 % — ABNORMAL LOW (ref 39.0–52.0)
Hemoglobin: 11.1 g/dL — ABNORMAL LOW (ref 13.0–17.0)
LYMPHS ABS: 1.7 10*3/uL (ref 0.7–4.0)
Lymphocytes Relative: 21 %
MCH: 38.9 pg — AB (ref 26.0–34.0)
MCHC: 36.9 g/dL — ABNORMAL HIGH (ref 30.0–36.0)
MCV: 105.6 fL — ABNORMAL HIGH (ref 78.0–100.0)
Monocytes Absolute: 1.4 10*3/uL — ABNORMAL HIGH (ref 0.1–1.0)
Monocytes Relative: 18 %
Neutro Abs: 4.7 10*3/uL (ref 1.7–7.7)
Neutrophils Relative %: 60 %
PLATELETS: 101 10*3/uL — AB (ref 150–400)
RBC: 2.85 MIL/uL — AB (ref 4.22–5.81)
RDW: 18.5 % — AB (ref 11.5–15.5)
WBC: 8 10*3/uL (ref 4.0–10.5)

## 2017-05-06 LAB — COMPREHENSIVE METABOLIC PANEL
ALT: 79 U/L — AB (ref 17–63)
AST: 148 U/L — ABNORMAL HIGH (ref 15–41)
Albumin: 1.9 g/dL — ABNORMAL LOW (ref 3.5–5.0)
Alkaline Phosphatase: 95 U/L (ref 38–126)
Anion gap: 8 (ref 5–15)
BUN: 28 mg/dL — ABNORMAL HIGH (ref 6–20)
CHLORIDE: 101 mmol/L (ref 101–111)
CO2: 22 mmol/L (ref 22–32)
CREATININE: 0.86 mg/dL (ref 0.61–1.24)
Calcium: 8.2 mg/dL — ABNORMAL LOW (ref 8.9–10.3)
GFR calc non Af Amer: >60 mL/min (ref >60)
Glucose, Bld: 124 mg/dL — ABNORMAL HIGH (ref 65–99)
POTASSIUM: 3.4 mmol/L — AB (ref 3.5–5.1)
Sodium: 131 mmol/L — ABNORMAL LOW (ref 135–145)
Total Bilirubin: 20.7 mg/dL (ref 0.3–1.2)
Total Protein: 5.2 g/dL — ABNORMAL LOW (ref 6.5–8.1)

## 2017-05-06 LAB — MAGNESIUM: Magnesium: 1.6 mg/dL — ABNORMAL LOW (ref 1.7–2.4)

## 2017-05-06 LAB — PHOSPHORUS: PHOSPHORUS: 2.7 mg/dL (ref 2.5–4.6)

## 2017-05-06 MED ORDER — MAGNESIUM SULFATE 2 GM/50ML IV SOLN
2.0000 g | Freq: Once | INTRAVENOUS | Status: AC
  Administered 2017-05-06: 2 g via INTRAVENOUS
  Filled 2017-05-06: qty 50

## 2017-05-06 MED ORDER — POTASSIUM CHLORIDE CRYS ER 20 MEQ PO TBCR
40.0000 meq | EXTENDED_RELEASE_TABLET | Freq: Once | ORAL | Status: AC
  Administered 2017-05-06: 40 meq via ORAL
  Filled 2017-05-06: qty 2

## 2017-05-06 NOTE — Progress Notes (Addendum)
PROGRESS NOTE    Gary [name]  WUJ:[id number] DOB: 01/25/1951 DOA: 05/03/2017 PCP: [name], NP   Brief Narrative: [name] a 67 y.o.malepast medical history of arthritis, hypertension, reflux, alcohol abuse, lower extremity edema, heart failure presents to the emergency room with chief complaint of weakness and jaundice. Patient states that he has been feeling weaker over the last 4 weeks. He also had several weeks of red painful scrotum and red irritated gluteal cleft. Patient was found to have elevated bilirubin.. GI consulted.  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   Assessment & Plan:   Principal Problem:   Hyperbilirubinemia Active Problems:   Hyponatremia   AKI (acute kidney injury) (HCC)   Jaundice   Encephalopathy, hepatic (HCC)   Junctional rhythm  Acute decompensated alcoholic liver cirrhosis: Madd[place] Discriminant Function Score was >60; Defer to Gastroenterology to start Prednisolone when Active Infection is r/o'd out C/w Furosemide 40 mg po Daily; Spironolactone held currently  Underwent Paracentesis to exclude SBP; Cytology showed just inflammatory cells C/w Zosyn for SBP Prophylaxis; WBC in Peritoneal  Fluid was 181 C/w Propanolol 10 mg po Daily  Counseled on EtOH Abuse US abdomen on 05/05/17  showed:Hepatic cirrhosis with stable left hepatic cyst measuring approximately 1.4 x 1.1 x 1.5 cm, Small volume of ascites GI planning for repeating EGD to rule out varices.  Junctional rhythm: Twelve-lead EKG showed junctional rhythm today.  Discussed with cardiology , Dr. Duke Salvia who evaluated the EKG and concluded no intervention needed.  Alcoholic hepatitis: Liver enzymes improving.  GI following and holding on starting prednisone until active infection is ruled out.  Elevated ammonia level: Continue lactulose  Hyponatremia: We will continue to monitor  Lactic acidosis: Improved  Scrotal fungal  skin infection: Continue nystatin and scrotal support.  Hypertension: Continue propranolol  Generalized weakness: Physical therapy evaluate the patient and recommended skilled nursing facility.  Acute kidney injury: Kidney function has improved with hydration.    GERD: Continue PPI  Anemia/Thrombocytopenia: Likely secondary to cirrhosis/alcohol.  Currently stable.  Continue folic acid and thiamine  DVT prophylaxis:Hep SubC Code Status: Full Family Communication: None present at the bedside Disposition Plan: Skilled nursing facility after GI ,cardiologyclearance  Consultants: Gastroenterology, cardiology  Procedures: Paracentesis  Antimicrobials:Ziosyn since 05/03/17  Subjective: Patient seen and examined the bedside this morning.  Was alert and oriented.  Denies any abdominal pain.  Noted to be tachycardic this morning.  EKG showed junctional rhythm  Objective: Vitals:   05/06/17 0414 05/06/17 1105 05/06/17 1407 05/06/17 1408  BP: 98/70 110/87 (!) 81/63 97/67  Pulse: 80 82 (!) 115   Resp: 20     Temp: 98.1 F (36.7 C)  98.1 F (36.7 C)   TempSrc: Oral  Oral   SpO2: 98%  97%   Weight:      Height:        Intake/Output Summary (Last 24 hours) at 05/06/2017 1435 Last data filed at 05/06/2017 1142 Gross per 24 hour  Intake 630 ml  Output -  Net 630 ml   Filed Weights   05/03/17 1532 05/04/17 0359  Weight: 103.5 kg (228 lb 2.8 oz) 104.8 kg (231 lb 0.7 oz)    Examination:  General exam: Appears calm and comfortable ,Not in distress,average built, chronically ill HEENT:PERRL,Oral mucosa moist, Ear/Nose normal on gross exam Respiratory system: Bilateral equal air entry, normal vesicular breath sounds, no wheezes or crackles  Cardiovascular system: S1 & S2 heard, RRR. No JVD,  murmurs, rubs, gallops or clicks. No pedal edema. Gastrointestinal system: Abdomen is  distended, soft and nontender. No organomegaly or masses felt. Normal bowel sounds heard. Central nervous  system: Alert and oriented. No focal neurological deficits. Extremities: No edema, no clubbing ,no cyanosis, distal peripheral pulses palpable. Skin:Icteric , No rashes, lesions or ulcers ,no pallor MSK: Normal muscle bulk,tone ,power Psychiatry: Judgement and insight appear normal. Mood & affect appropriate.     Data Reviewed: I have personally reviewed following labs and imaging studies  CBC: Recent Labs  Lab 05/03/17 0843 05/04/17 0749 05/05/17 0440 05/06/17 0424  WBC 9.9 6.9 7.5 8.0  NEUTROABS 7.2 4.7 4.5 4.7  HGB 11.1* 11.0* 11.1* 11.1*  HCT 29.6* 30.0* 30.0* 30.1*  MCV 103.1* 104.2* 104.2* 105.6*  PLT 115* 91* 92* 101*   Basic Metabolic Panel: Recent Labs  Lab 05/04/17 0010 05/04/17 0749 05/04/17 1212 05/05/17 0440 05/06/17 0424  NA 128* 130* 128* 131* 131*  K 4.0 3.7 3.5 3.5 3.4*  CL 99* 100* 98* 102 101  CO2 19* 19* 20* 20* 22  GLUCOSE 115* 102* 136* 110* 124*  BUN 37* 39* 35* 31* 28*  CREATININE 1.34* 1.21 1.32* 0.94 0.86  CALCIUM 8.6* 8.6* 8.3* 8.4* 8.2*  MG  --  1.5*  --  1.8 1.6*  PHOS  --  2.9  --  2.7 2.7   GFR: Estimated Creatinine Clearance: 101 mL/min (by C-G formula based on SCr of 0.86 mg/dL). Liver Function Tests: Recent Labs  Lab 05/03/17 0843 05/04/17 0749 05/05/17 0440 05/06/17 0424  AST 181* 165* 158* 148*  ALT 87* 78* 80* 79*  ALKPHOS 126 99 97 95  BILITOT 23.2* 22.7* 22.6* 20.7*  PROT 6.9 5.5* 5.3* 5.2*  ALBUMIN 2.5* 2.2* 2.1* 1.9*   Recent Labs  Lab 05/03/17 0843  LIPASE 133*   Recent Labs  Lab 05/03/17 0845  AMMONIA 65*   Coagulation Profile: Recent Labs  Lab 05/03/17 0843 05/05/17 1046  INR 2.08 2.18   Cardiac Enzymes: Recent Labs  Lab 05/03/17 0843  CKTOTAL 135   BNP (last 3 results) No results for input(s): PROBNP in the last 8760 hours. HbA1C: No results for input(s): HGBA1C in the last 72 hours. CBG: Recent Labs  Lab 05/06/17 1153  GLUCAP 126*   Lipid Profile: No results for input(s): CHOL,  HDL, LDLCALC, TRIG, CHOLHDL, LDLDIRECT in the last 72 hours. Thyroid Function Tests: No results for input(s): TSH, T4TOTAL, FREET4, T3FREE, THYROIDAB in the last 72 hours. Anemia Panel: No results for input(s): VITAMINB12, FOLATE, FERRITIN, TIBC, IRON, RETICCTPCT in the last 72 hours. Sepsis Labs: Recent Labs  Lab 05/03/17 1448 05/03/17 1535 05/03/17 1831  LATICACIDVEN 4.43* 2.2* 1.6    Recent Results (from the past 240 hour(s))  Culture, blood (routine x 2)     Status: None (Preliminary result)   Collection Time: 05/03/17  8:44 AM  Result Value Ref Range Status   Specimen Description BLOOD RIGHT ANTECUBITAL  Final   Special Requests   Final    BOTTLES DRAWN AEROBIC AND ANAEROBIC Blood Culture adequate volume Performed at [facility], [address] [place], [place] [address]    Culture   Final    NO GROWTH 2 DAYS Performed at [facility], [address] [place], [place] [address]    Report Status PENDING  Incomplete  Culture, blood (routine x 2)     Status: None (Preliminary result)   Collection Time: 05/03/17  8:49 AM  Result Value  Ref Range Status   Specimen Description BLOOD LEFT ANTECUBITAL  Final   Special Requests   Final    BOTTLES DRAWN AEROBIC AND ANAEROBIC Blood Culture adequate volume Performed at [facility], [address] [place], [place] [address]    Culture   Final    NO GROWTH 2 DAYS Performed at [facility], [address] [place], [place] [address]    Report Status PENDING  Incomplete  Gram stain     Status: None   Collection Time: 05/03/17  2:30 PM  Result Value Ref Range Status   Specimen Description ABDOMEN FLUID  Final   Special Requests NONE  Final   Gram Stain   Final    WBC PRESENT,BOTH PMN AND MONONUCLEAR NO ORGANISMS SEEN CRITICAL RESULT CALLED TO, READ BACK BY AND VERIFIED WITH: J.Baruch Merl, RN 05/03/17 @1938  BY V.WILKINS Performed at [facility], [address], [place], [place] [address]    Report Status 05/03/2017 FINAL  Final         Radiology Studies: US Abdomen Complete  Result Date: 05/05/2017 CLINICAL DATA:  Cirrhosis, hyperbilirubinemia, acute renal injury and jaundice. EXAM: ABDOMEN ULTRASOUND COMPLETE COMPARISON:  Right upper quadrant ultrasound from 05/03/2017, CT 06/15/2015 FINDINGS: Gallbladder: No gallstones or wall thickening visualized. No sonographic Murphy sign noted by sonographer. Gallbladder wall is normal at 3 mm. Small amount of ascites adjacent to the gallbladder fundus. Biliary sludge noted previously is less apparent on current exam. Common bile duct: Diameter: 5.8 mm normal without choledocholithiasis. Liver: Coarse hepatic echotexture with nodular surface contours of the liver compatible with cirrhosis. Well-circumscribed 1.4 x 1.1 x 1.5 cm cyst is visualized and stable along the anterior aspect of the left hepatic lobe. IVC: No abnormality visualized. Pancreas: Limited visualization of the pancreas due to bowel gas. No focal abnormality is identified in the upper abdominal retroperitoneum. Spleen: Long thin appearing spleen measuring up to 14 cm in length, unchanged relative to prior CT from 2017. Right Kidney: Length: 11.1 cm. Echogenicity within normal limits. No mass or hydronephrosis visualized. Left Kidney: Length: 11.9 cm. Echogenicity within normal limits. No mass or hydronephrosis visualized. Abdominal aorta: The mid aorta is not aneurysmal measuring 1.8 cm. The upper and distal aorta are not well visualized. Other findings: Small volume of ascites noted outlining the liver and spleen. IMPRESSION: 1. Hepatic cirrhosis with stable left hepatic cyst measuring approximately 1.4 x 1.1 x 1.5 cm. 2. Small volume of ascites. 3. Unremarkable appearance of the gallbladder. Biliary sludge is less apparent on current exam. 4. Stable long, thin appearance of the spleen measuring up to 14 cm. Electronically Signed   By: Tollie Eth M.D.    On: 05/05/2017 19:31   US Abdominal Pelvic Art/vent Flow Doppler  Result Date: 05/05/2017 CLINICAL DATA:  Cirrhosis.  Jaundice.  Inpatient. EXAM: DUPLEX ULTRASOUND OF LIVER TECHNIQUE: Color and duplex Doppler ultrasound was performed to evaluate the hepatic in-flow and out-flow vessels. COMPARISON:  06/15/2015 CT abdomen. FINDINGS: Portal Vein Velocities Main:  21 cm/sec Main portal vein diameter 1.1 cm. Patent main portal vein with appropriate flow direction. The right and left portal veins were unable to be visualized on this very limited scan limited by patient body habitus, bowel gas and difficulty breath holding. Hepatic Vein Velocities Left:  65 cm/sec The left hepatic vein is patent with appropriate flow direction. The middle and right hepatic veins could not be visualized. Hepatic Artery Velocity:  162 cm/sec Splenic Vein Velocity:  21  cm/sec Patent splenic vein with appropriate flow direction. Varices: Absent. Ascites: Small volume ascites. Spleen dimensions 10.7 x 4.4 x 12.2 cm (volume = 300 cm^3). Normal size spleen. IMPRESSION: Very limited scan, see comments. No acute hepatic vascular abnormality detected. CT abdomen/pelvis with IV contrast may be considered if there is continued clinical concern for acute hepatic vascular abnormality, given this scan's significant limitations. Small volume ascites. Electronically Signed   By: Delbert Phenix M.D.   On: 05/05/2017 20:06        Scheduled Meds: . aspirin EC  325 mg Oral Daily  . folic acid  1 mg Oral Daily  . furosemide  40 mg Oral Daily  . heparin  5,000 Units Subcutaneous Q8H  . lactulose  30 g Oral TID  . liver oil-zinc oxide   Topical BID  . multivitamin with minerals  1 tablet Oral Daily  . nystatin ointment   Topical BID  . pantoprazole  40 mg Oral QAC breakfast  . propranolol  10 mg Oral Daily  . sodium chloride flush  3 mL Intravenous Q12H  . thiamine  100 mg Oral Daily   Or  . thiamine  100 mg Intravenous Daily   Continuous  Infusions: . piperacillin-tazobactam (ZOSYN)  IV 3.375 g (05/06/17 1315)     LOS: 3 days    Time spent: 25 mins.More than 50% of that time was spent in counseling and/or coordination of care.      Meredith Leeds, MD [facility] Pager [phone]  If 7PM-7AM, please contact night-coverage [email or web address] Password [email or web address] 05/06/2017, 2:35 PM

## 2017-05-06 NOTE — Care Management Important Message (Signed)
Important Message  Patient Details  Name: Gary [name] MRN: [id number] Date of Birth: 1951-01-27   Medicare Important Message Given:  Yes    Caren Macadam 05/06/2017, 11:28 AMImportant Message  Patient Details  Name: Gary [name] MRN: [id number] Date of Birth: October 18, 1950   Medicare Important Message Given:  Yes    Caren Macadam 05/06/2017, 11:28 AM

## 2017-05-06 NOTE — Progress Notes (Signed)
Physical Therapy Treatment Patient Details Name: Gary [name] MRN: [id number] DOB: 01-13-1951 Today's Date: 05/06/2017    History of Present Illness 67 y.o. male past medical history of arthritis, hypertension, reflux, alcohol abuse, lower extremity edema, heart failure presents to the emergency room with chief complaint of weakness and jaundice and admitted for Hyperbilirubinemia    PT Comments    Progressing slowly with mobility. He was able to walk a little farther on today. Pt remains weak. Continue to recommend SNF.    Follow Up Recommendations  SNF     Equipment Recommendations  None recommended by PT    Recommendations for Other Services       Precautions / Restrictions Precautions Precautions: Fall Restrictions Weight Bearing Restrictions: No    Mobility  Bed Mobility Overal bed mobility: Needs Assistance Bed Mobility: Supine to Sit;Sit to Supine     Supine to sit: HOB elevated;Mod assist Sit to supine: Mod assist   General bed mobility comments: Assist for trunk and bil LEs. Increased time. Utilized bedpad for scooting, positioning.   Transfers Overall transfer level: Needs assistance Equipment used: Rolling walker (2 wheeled) Transfers: Sit to/from Stand Sit to Stand: Min assist;From elevated surface         General transfer comment: Assist to rise, stabilize. Increased time.   Ambulation/Gait Ambulation/Gait assistance: Min assist Ambulation Distance (Feet): 10 Feet Assistive device: Rolling walker (2 wheeled) Gait Pattern/deviations: Wide base of support     General Gait Details: Assist to stabilize. Slow gait speed. Pt fatigues fairly easily.    Stairs            Wheelchair Mobility    Modified Rankin (Stroke Patients Only)       Balance Overall balance assessment: Needs assistance         Standing balance support: Bilateral upper extremity supported Standing balance-Leahy Scale: Poor                               Cognition Arousal/Alertness: Awake/alert Behavior During Therapy: Flat affect Overall Cognitive Status: Within Functional Limits for tasks assessed                                        Exercises      General Comments        Pertinent Vitals/Pain Pain Assessment: Faces Pain Location: perineal area Pain Descriptors / Indicators: Sore Pain Intervention(s): Limited activity within patient's tolerance;Repositioned    Home Living                      Prior Function            PT Goals (current goals can now be found in the care plan section) Progress towards PT goals: Progressing toward goals    Frequency    Min 3X/week      PT Plan Current plan remains appropriate    Co-evaluation              AM-PAC PT "6 Clicks" Daily Activity  Outcome Measure  Difficulty turning over in bed (including adjusting bedclothes, sheets and blankets)?: A Lot Difficulty moving from lying on back to sitting on the side of the bed? : Unable Difficulty sitting down on and standing up from a chair with arms (e.g., wheelchair, bedside commode, etc,.)?: Unable Help needed moving to and  from a bed to chair (including a wheelchair)?: A Lot Help needed walking in hospital room?: A Lot Help needed climbing 3-5 steps with a railing? : Total 6 Click Score: 9    End of Session   Activity Tolerance: Patient limited by fatigue Patient left: in bed;with call bell/phone within reach;with bed alarm set   PT Visit Diagnosis: Unsteadiness on feet (R26.81);Difficulty in walking, not elsewhere classified (R26.2)     Time: 0865-7846 PT Time Calculation (min) (ACUTE ONLY): 15 min  Charges:  $Gait Training: 8-22 mins                    G Codes:       Rebeca Alert, MPT Pager: [phone]

## 2017-05-06 NOTE — Clinical Social Work Placement (Signed)
Patient provided with bed offers, patient selected [facility]. Facility confirmed bed offer, CSW asked [facility] to start [facility]. CSW will continue to follow and assist with discharge planning.  CLINICAL SOCIAL WORK PLACEMENT  NOTE  Date:  05/06/2017  Patient Details  Name: Gary [name] MRN: [id number] Date of Birth: 03/29/1950  Clinical Social Work is seeking post-discharge placement for this patient at the Skilled  Nursing Facility level of care (*CSW will initial, date and re-position this form in  chart as items are completed):  Yes   Patient/family provided with [place] Clinical Social Work Department's list of facilities offering this level of care within the geographic area requested by the patient (or if unable, by the patient's family).  Yes   Patient/family informed of their freedom to choose among providers that offer the needed level of care, that participate in Medicare, Medicaid or managed care program needed by the patient, have an available bed and are willing to accept the patient.  Yes   Patient/family informed of [facility]'s ownership interest in [facility] and [facility], as well as of the fact that they are under no obligation to receive care at these facilities.  PASRR submitted to EDS on       PASRR number received on       Existing PASRR number confirmed on 05/05/17     FL2 transmitted to all facilities in geographic area requested by pt/family on 05/05/17     FL2 transmitted to all facilities within larger geographic area on       Patient informed that his/her managed care company has contracts with or will negotiate with certain facilities, including the following:        Yes   Patient/family informed of bed offers received.  Patient chooses bed at [facility]     Physician recommends and patient chooses bed at      Patient to be transferred to   on  .  Patient to be  transferred to facility by       Patient family notified on   of transfer.  Name of family member notified:        PHYSICIAN       Additional Comment:    _______________________________________________ Antionette Poles, LCSW 05/06/2017, 10:17 AM

## 2017-05-07 ENCOUNTER — Telehealth: Payer: Self-pay | Admitting: Gastroenterology

## 2017-05-07 DIAGNOSIS — L8990 Pressure ulcer of unspecified site, unspecified stage: Secondary | ICD-10-CM

## 2017-05-07 LAB — COMPREHENSIVE METABOLIC PANEL
ALK PHOS: 96 U/L (ref 38–126)
ALT: 73 U/L — AB (ref 17–63)
AST: 135 U/L — AB (ref 15–41)
Albumin: 1.7 g/dL — ABNORMAL LOW (ref 3.5–5.0)
Anion gap: 8 (ref 5–15)
BUN: 26 mg/dL — AB (ref 6–20)
CALCIUM: 7.8 mg/dL — AB (ref 8.9–10.3)
CHLORIDE: 99 mmol/L — AB (ref 101–111)
CO2: 20 mmol/L — ABNORMAL LOW (ref 22–32)
CREATININE: 1.01 mg/dL (ref 0.61–1.24)
GFR calc Af Amer: >60 mL/min (ref >60)
Glucose, Bld: 109 mg/dL — ABNORMAL HIGH (ref 65–99)
Potassium: 3.6 mmol/L (ref 3.5–5.1)
Sodium: 127 mmol/L — ABNORMAL LOW (ref 135–145)
Total Bilirubin: 19.1 mg/dL (ref 0.3–1.2)
Total Protein: 5 g/dL — ABNORMAL LOW (ref 6.5–8.1)

## 2017-05-07 LAB — AMMONIA: Ammonia: 59 umol/L — ABNORMAL HIGH (ref 9–35)

## 2017-05-07 LAB — MAGNESIUM: MAGNESIUM: 1.9 mg/dL (ref 1.7–2.4)

## 2017-05-07 MED ORDER — PHYTONADIONE 5 MG PO TABS
5.0000 mg | ORAL_TABLET | Freq: Once | ORAL | Status: AC
  Administered 2017-05-07: 5 mg via ORAL
  Filled 2017-05-07: qty 1

## 2017-05-07 MED ORDER — RIFAXIMIN 550 MG PO TABS
550.0000 mg | ORAL_TABLET | Freq: Two times a day (BID) | ORAL | Status: DC
  Administered 2017-05-07 – 2017-05-14 (×15): 550 mg via ORAL
  Filled 2017-05-07 (×16): qty 1

## 2017-05-07 MED ORDER — SPIRONOLACTONE 25 MG PO TABS
100.0000 mg | ORAL_TABLET | Freq: Every day | ORAL | Status: DC
  Administered 2017-05-08 – 2017-05-11 (×4): 100 mg via ORAL
  Filled 2017-05-07 (×4): qty 4

## 2017-05-07 MED ORDER — SPIRONOLACTONE 25 MG PO TABS
50.0000 mg | ORAL_TABLET | Freq: Every day | ORAL | Status: DC
  Administered 2017-05-07: 50 mg via ORAL
  Filled 2017-05-07: qty 2

## 2017-05-07 NOTE — Progress Notes (Signed)
CSW contacted by [facility] admissions staff and informed that their SNF no longer has a contract with [facility] nor does their sister facility [facility]. SNF reported that they have to retract patient's bed offer unless patient is able to private pay.  CSW provided update to patient and inquired if patient was able to private pay for SNF. Patient reported that he was unable to private pay for SNF. Patient reported that he receives approx. 1920/month from [employer] and it is not enough to cover his bills. CSW inquired about patient's assistance at home, patient reported that his wife can assist him when she is home from work. Patient reported that he is agreeable to home health services. RNCM notified and agreed to follow up with patient about home health services.   Patient does not have any other bed offers from SNFs that accept [facility].  Patient to dc home, not agreeable to private pay for SNF.   CSW signing off, no other needs identified at this time.   Celso Sickle, [place] Clinical Social Worker [facility] Cell#: [phone]

## 2017-05-07 NOTE — Progress Notes (Addendum)
[name] Progress Note   Chief Complaint:   cirrhosis  SUBJECTIVE:    no complaints, feels okay   ASSESSMENT AND PLAN:   1. 67 yo male admitted several days ago with decompensated cirrhosis / probable superimposed ETOH hepatitis. MELD is 40. Maddrey's DF 61. Poor prognosis. There was concern for underlying infection so prednisolone for ETOH hepatitis hasn't been started. No SBP. Fluid cytology negative for malignancy but does reveal acute inflammation (prior to Zosyn being started) -urine, cxr negative, blood cx negative. May start steroids soon since no active infection found.  -needs I+O. Abdomen appears to be increasing and + lower extremity edema. Renal function almost back to normal. Will need to start diuretics soon.  -continue low sodium diet.  -HCC surveillance - AFP normal. Liver cyst on u/s, no other liver lesions  2. AKI, resolved 2AKI OBJECTIVE:      Vital signs in last 24 hours: Temp:  [98.1 F (36.7 C)-98.5 F (36.9 C)] 98.3 F (36.8 C) (03/07 0531) Pulse Rate:  [82-115] 115 (03/07 0531) Resp:  [14-16] 14 (03/07 0531) BP: (81-112)/(58-88) 112/88 (03/07 0531) SpO2:  [94 %-97 %] 96 % (03/07 0531) Last BM Date: 05/07/17 General:   Alert, well-developed, male in NAD EENT:  Normal hearing, icteric sclera Heart:  Regular rate and rhythm; no murmurs. 2+ BLE edema Pulm: Normal respiratory effort Abdomen:  Soft, moderately distended, normal bowel sounds, no masses felt. Neurologic:  Alert and  oriented x4;  Mild asterixis. Psych:  Pleasant, cooperative.  Normal mood and affect.   Intake/Output from previous day: 03/06 0701 - 03/07 0700 In: 510 [P.O.:360; IV Piggyback:150] Out: -  Intake/Output this shift: No intake/output data recorded.  Lab Results: Recent Labs    05/05/17 0440 05/06/17 0424  WBC 7.5 8.0  HGB 11.1* 11.1*  HCT 30.0* 30.1*  PLT 92* 101*   BMET Recent Labs    05/05/17 0440 05/06/17 0424 05/07/17 0433  NA  131* 131* 127*  K 3.5 3.4* 3.6  CL 102 101 99*  CO2 20* 22 20*  GLUCOSE 110* 124* 109*  BUN 31* 28* 26*  CREATININE 0.94 0.86 1.01  CALCIUM 8.4* 8.2* 7.8*   LFT Recent Labs    05/07/17 0433  PROT 5.0*  ALBUMIN 1.7*  AST 135*  ALT 73*  ALKPHOS 96  BILITOT 19.1*   PT/INR Recent Labs    05/05/17 1046  LABPROT 24.1*  INR 2.18   Hepatitis Panel No results for input(s): HEPBSAG, HCVAB, HEPAIGM, HEPBIGM in the last 72 hours.  US Abdomen Complete  Result Date: 05/05/2017 CLINICAL DATA:  Cirrhosis, hyperbilirubinemia, acute renal injury and jaundice. EXAM: ABDOMEN ULTRASOUND COMPLETE COMPARISON:  Right upper quadrant ultrasound from 05/03/2017, CT 06/15/2015 FINDINGS: Gallbladder: No gallstones or wall thickening visualized. No sonographic Murphy sign noted by sonographer. Gallbladder wall is normal at 3 mm. Small amount of ascites adjacent to the gallbladder fundus. Biliary sludge noted previously is less apparent on current exam. Common bile duct: Diameter: 5.8 mm normal without choledocholithiasis. Liver: Coarse hepatic echotexture with nodular surface contours of the liver compatible with cirrhosis. Well-circumscribed 1.4 x 1.1 x 1.5 cm cyst is visualized and stable along the anterior aspect of the left hepatic lobe. IVC: No abnormality visualized. Pancreas: Limited visualization of the pancreas due to bowel gas. No focal abnormality is identified in the upper abdominal retroperitoneum. Spleen: Long thin appearing spleen measuring up to 14 cm in length, unchanged relative to prior CT from 2017. Right Kidney: Length: 11.1  cm. Echogenicity within normal limits. No mass or hydronephrosis visualized. Left Kidney: Length: 11.9 cm. Echogenicity within normal limits. No mass or hydronephrosis visualized. Abdominal aorta: The mid aorta is not aneurysmal measuring 1.8 cm. The upper and distal aorta are not well visualized. Other findings: Small volume of ascites noted outlining the liver and spleen.  IMPRESSION: 1. Hepatic cirrhosis with stable left hepatic cyst measuring approximately 1.4 x 1.1 x 1.5 cm. 2. Small volume of ascites. 3. Unremarkable appearance of the gallbladder. Biliary sludge is less apparent on current exam. 4. Stable long, thin appearance of the spleen measuring up to 14 cm. Electronically Signed   By: Tollie Eth M.D.   On: 05/05/2017 19:31   US Abdominal Pelvic Art/vent Flow Doppler  Result Date: 05/05/2017 CLINICAL DATA:  Cirrhosis.  Jaundice.  Inpatient. EXAM: DUPLEX ULTRASOUND OF LIVER TECHNIQUE: Color and duplex Doppler ultrasound was performed to evaluate the hepatic in-flow and out-flow vessels. COMPARISON:  06/15/2015 CT abdomen. FINDINGS: Portal Vein Velocities Main:  21 cm/sec Main portal vein diameter 1.1 cm. Patent main portal vein with appropriate flow direction. The right and left portal veins were unable to be visualized on this very limited scan limited by patient body habitus, bowel gas and difficulty breath holding. Hepatic Vein Velocities Left:  65 cm/sec The left hepatic vein is patent with appropriate flow direction. The middle and right hepatic veins could not be visualized. Hepatic Artery Velocity:  162 cm/sec Splenic Vein Velocity:  21 cm/sec Patent splenic vein with appropriate flow direction. Varices: Absent. Ascites: Small volume ascites. Spleen dimensions 10.7 x 4.4 x 12.2 cm (volume = 300 cm^3). Normal size spleen. IMPRESSION: Very limited scan, see comments. No acute hepatic vascular abnormality detected. CT abdomen/pelvis with IV contrast may be considered if there is continued clinical concern for acute hepatic vascular abnormality, given this scan's significant limitations. Small volume ascites. Electronically Signed   By: Delbert Phenix M.D.   On: 05/05/2017 20:06    LOS: 4 days   Willette Cluster ,NP 05/07/2017, 10:08 AM  Pager number [phone]    Attending physician's note   I have taken an interval history, reviewed the chart and examined the  patient. I agree with the Advanced Practitioner's note, impression and recommendations.  Decompensated cirrhosis and acute alcoholic hepatitis. MELD 26, Discriminant function >32 Cytology showed evidence of acute inflammation. Neutrophil cell count was low, not consistent with SBP. He is currently on Zosyn, consider DC antibiotics if has no evidence of active infection Started on Aldactone 50 mg daily today, will increase to 100 mg daily tomorrow. Continue Furosemide 40 mg daily Low sodium diet Bilirubin is trending down. PT is elevated, will give Vitamin K po and recheck PT/INR tomorrow Will hold off starting Prednisolone given his initial presentation and ?acute inflammatory cells on cytology of ascitic fluid. Continue Rifaximin and Lactulose  K Scherry Ran, MD [phone] Mon-Fri 8a-5p [phone] after 5p, weekends, holidays

## 2017-05-07 NOTE — Progress Notes (Signed)
PROGRESS NOTE    CHAYIM BIALAS  AVW:[id number] DOB: [birth year] DOA: 05/03/2017 PCP: [name], NP   Brief Narrative: [name] a 67 y.o.malepast medical history of arthritis, hypertension, reflux, alcohol abuse, lower extremity edema, heart failure presents to the emergency room with chief complaint of weakness and jaundice. Patient states that he has been feeling weaker over the last 4 weeks. He also had several weeks of red painful scrotum and red irritated gluteal cleft. Patient was found to have elevated bilirubin.. GI consulted.  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   Assessment & Plan:   Principal Problem:   Hyperbilirubinemia Active Problems:   Hyponatremia   AKI (acute kidney injury) (HCC)   Jaundice   Encephalopathy, hepatic (HCC)   Junctional rhythm  Acute decompensated alcoholic liver cirrhosis: Maddrey Discriminant Function Score was >60; Defer to Gastroenterology to start Prednisolone. C/w Furosemide 40 mg po Daily; Spironolactone also started  Underwent Paracentesis to exclude SBP; Cytology showed just inflammatory cells C/w Zosyn for SBP Prophylaxis; WBC in Peritoneal  Fluid was 181 C/w Propanolol 10 mg po Daily  Counseled on EtOH Abuse US abdomen on 05/05/17  showed:Hepatic cirrhosis with stable left hepatic cyst measuring approximately 1.4 x 1.1 x 1.5 cm, Small volume of ascites GI planning for repeating EGD to rule out varices.  Junctional rhythm: Twelve-lead EKG showed junctional rhythm .  Discussed with cardiology , Dr. Duke Salvia who evaluated the EKG and concluded no intervention needed.  Alcoholic hepatitis: Liver enzymes improving.  GI following and holding on starting prednisone until active infection is ruled out.  Elevated ammonia level: Continue lactulose.Added rifaximin today because patient is hesitant to take lactulose and his ammonia level is still high.  Hyponatremia: We will  continue to monitor  Lactic acidosis: Improved  Scrotal fungal skin infection: Continue nystatin and scrotal support.  Hypertension: Continue propranolol  Generalized weakness: Physical therapy evaluate the patient and recommended skilled nursing facility.  Acute kidney injury: Kidney function has improved with hydration.    GERD: Continue PPI  Anemia/Thrombocytopenia: Likely secondary to cirrhosis/alcohol.  Currently stable.  Continue folic acid and thiamine  DVT prophylaxis:Hep SubC Code Status: Full Family Communication: None present at the bedside Disposition Plan: Skilled nursing facility after GI   Consultants: Gastroenterology, cardiology  Procedures: Paracentesis  Antimicrobials:Zosyn since 05/03/17  Subjective: Patient seen and examined the bedside this morning.  Remains comfortable.  Mental status has improved though he is slow to respond.  Denies any abdominal pain  Objective: Vitals:   05/06/17 1408 05/06/17 1443 05/06/17 2224 05/07/17 0531  BP: 97/67  (!) 93/58 112/88  Pulse:  94 (!) 114 (!) 115  Resp:   16 14  Temp:   98.5 F (36.9 C) 98.3 F (36.8 C)  TempSrc:   Oral Oral  SpO2:   94% 96%  Weight:      Height:        Intake/Output Summary (Last 24 hours) at 05/07/2017 1248 Last data filed at 05/07/2017 8295 Gross per 24 hour  Intake 390 ml  Output -  Net 390 ml   Filed Weights   05/03/17 1532 05/04/17 0359  Weight: 103.5 kg (228 lb 2.8 oz) 104.8 kg (231 lb 0.7 oz)    Examination:  General exam: Appears calm and comfortable ,Not in distress,obese HEENT:PERRL,Oral mucosa moist, Ear/Nose normal on gross exam Respiratory system: Bilateral equal air entry, normal vesicular breath sounds, no wheezes or crackles  Cardiovascular system: S1 &  S2 heard, RRR. No JVD, murmurs, rubs, gallops or clicks. Gastrointestinal system: Abdomen is distended, soft and nontender. No organomegaly or masses felt. Normal bowel sounds heard. Central nervous system: Alert  and oriented.  Confused intermittently Extremities: 1-2 + edema, no clubbing ,no cyanosis, distal peripheral pulses palpable. Skin: Icterus,No rashes, lesions or ulcers ,no pallor Psychiatry: Judgement and insight appear normal. Mood & affect appropriate.    Data Reviewed: I have personally reviewed following labs and imaging studies  CBC: Recent Labs  Lab 05/03/17 0843 05/04/17 0749 05/05/17 0440 05/06/17 0424  WBC 9.9 6.9 7.5 8.0  NEUTROABS 7.2 4.7 4.5 4.7  HGB 11.1* 11.0* 11.1* 11.1*  HCT 29.6* 30.0* 30.0* 30.1*  MCV 103.1* 104.2* 104.2* 105.6*  PLT 115* 91* 92* 101*   Basic Metabolic Panel: Recent Labs  Lab 05/04/17 0749 05/04/17 1212 05/05/17 0440 05/06/17 0424 05/07/17 0433  NA 130* 128* 131* 131* 127*  K 3.7 3.5 3.5 3.4* 3.6  CL 100* 98* 102 101 99*  CO2 19* 20* 20* 22 20*  GLUCOSE 102* 136* 110* 124* 109*  BUN 39* 35* 31* 28* 26*  CREATININE 1.21 1.32* 0.94 0.86 1.01  CALCIUM 8.6* 8.3* 8.4* 8.2* 7.8*  MG 1.5*  --  1.8 1.6* 1.9  PHOS 2.9  --  2.7 2.7  --    GFR: Estimated Creatinine Clearance: 86 mL/min (by C-G formula based on SCr of 1.01 mg/dL). Liver Function Tests: Recent Labs  Lab 05/03/17 0843 05/04/17 0749 05/05/17 0440 05/06/17 0424 05/07/17 0433  AST 181* 165* 158* 148* 135*  ALT 87* 78* 80* 79* 73*  ALKPHOS 126 99 97 95 96  BILITOT 23.2* 22.7* 22.6* 20.7* 19.1*  PROT 6.9 5.5* 5.3* 5.2* 5.0*  ALBUMIN 2.5* 2.2* 2.1* 1.9* 1.7*   Recent Labs  Lab 05/03/17 0843  LIPASE 133*   Recent Labs  Lab 05/03/17 0845 05/07/17 0433  AMMONIA 65* 59*   Coagulation Profile: Recent Labs  Lab 05/03/17 0843 05/05/17 1046  INR 2.08 2.18   Cardiac Enzymes: Recent Labs  Lab 05/03/17 0843  CKTOTAL 135   BNP (last 3 results) No results for input(s): PROBNP in the last 8760 hours. HbA1C: No results for input(s): HGBA1C in the last 72 hours. CBG: Recent Labs  Lab 05/06/17 1153 05/06/17 1728  GLUCAP 126* 131*   Lipid Profile: No results  for input(s): CHOL, HDL, LDLCALC, TRIG, CHOLHDL, LDLDIRECT in the last 72 hours. Thyroid Function Tests: No results for input(s): TSH, T4TOTAL, FREET4, T3FREE, THYROIDAB in the last 72 hours. Anemia Panel: No results for input(s): VITAMINB12, FOLATE, FERRITIN, TIBC, IRON, RETICCTPCT in the last 72 hours. Sepsis Labs: Recent Labs  Lab 05/03/17 1610 05/03/17 1535 05/03/17 1831  LATICACIDVEN 4.43* 2.2* 1.6    Recent Results (from the past 240 hour(s))  Culture, blood (routine x 2)     Status: None (Preliminary result)   Collection Time: 05/03/17  8:44 AM  Result Value Ref Range Status   Specimen Description BLOOD RIGHT ANTECUBITAL  Final   Special Requests   Final    BOTTLES DRAWN AEROBIC AND ANAEROBIC Blood Culture adequate volume Performed at [facility], [address], [place], [place] [address]    Culture   Final    NO GROWTH 4 DAYS Performed at [facility], [address], [place], [place] [address]    Report Status PENDING  Incomplete  Culture, blood (routine x 2)     Status: None (Preliminary result)   Collection Time: 05/03/17  8:49 AM  Result Value Ref Range Status   Specimen Description BLOOD LEFT ANTECUBITAL  Final   Special Requests   Final    BOTTLES DRAWN AEROBIC AND ANAEROBIC Blood Culture adequate volume Performed at [facility], [address], [place], [place] [address]    Culture   Final    NO GROWTH 4 DAYS Performed at [facility], [address] [place], [place] [address]    Report Status PENDING  Incomplete  Gram stain     Status: None   Collection Time: 05/03/17  2:30 PM  Result Value Ref Range Status   Specimen Description ABDOMEN FLUID  Final   Special Requests NONE  Final   Gram Stain   Final    WBC PRESENT,BOTH PMN AND MONONUCLEAR NO ORGANISMS SEEN CRITICAL RESULT CALLED TO, READ BACK BY AND VERIFIED WITH: J.Baruch Merl, RN 05/03/17 @1938  BY V.WILKINS Performed at [facility], [address], [place], [place] [address]    Report Status 05/03/2017 FINAL  Final         Radiology Studies: US Abdomen Complete  Result Date: 05/05/2017 CLINICAL DATA:  Cirrhosis, hyperbilirubinemia, acute renal injury and jaundice. EXAM: ABDOMEN ULTRASOUND COMPLETE COMPARISON:  Right upper quadrant ultrasound from 05/03/2017, CT 06/15/2015 FINDINGS: Gallbladder: No gallstones or wall thickening visualized. No sonographic Murphy sign noted by sonographer. Gallbladder wall is normal at 3 mm. Small amount of ascites adjacent to the gallbladder fundus. Biliary sludge noted previously is less apparent on current exam. Common bile duct: Diameter: 5.8 mm normal without choledocholithiasis. Liver: Coarse hepatic echotexture with nodular surface contours of the liver compatible with cirrhosis. Well-circumscribed 1.4 x 1.1 x 1.5 cm cyst is visualized and stable along the anterior aspect of the left hepatic lobe. IVC: No abnormality visualized. Pancreas: Limited visualization of the pancreas due to bowel gas. No focal abnormality is identified in the upper abdominal retroperitoneum. Spleen: Long thin appearing spleen measuring up to 14 cm in length, unchanged relative to prior CT from 2017. Right Kidney: Length: 11.1 cm. Echogenicity within normal limits. No mass or hydronephrosis visualized. Left Kidney: Length: 11.9 cm. Echogenicity within normal limits. No mass or hydronephrosis visualized. Abdominal aorta: The mid aorta is not aneurysmal measuring 1.8 cm. The upper and distal aorta are not well visualized. Other findings: Small volume of ascites noted outlining the liver and spleen. IMPRESSION: 1. Hepatic cirrhosis with stable left hepatic cyst measuring approximately 1.4 x 1.1 x 1.5 cm. 2. Small volume of ascites. 3. Unremarkable appearance of the gallbladder. Biliary sludge is less apparent on current exam. 4. Stable long, thin appearance of the spleen measuring up to 14 cm. Electronically  Signed   By: Tollie Eth M.D.   On: 05/05/2017 19:31   US Abdominal Pelvic Art/vent Flow Doppler  Result Date: 05/05/2017 CLINICAL DATA:  Cirrhosis.  Jaundice.  Inpatient. EXAM: DUPLEX ULTRASOUND OF LIVER TECHNIQUE: Color and duplex Doppler ultrasound was performed to evaluate the hepatic in-flow and out-flow vessels. COMPARISON:  06/15/2015 CT abdomen. FINDINGS: Portal Vein Velocities Main:  21 cm/sec Main portal vein diameter 1.1 cm. Patent main portal vein with appropriate flow direction. The right and left portal veins were unable to be visualized on this very limited scan limited by patient body habitus, bowel gas and difficulty breath holding. Hepatic Vein Velocities Left:  65 cm/sec The left hepatic vein is patent with appropriate flow direction. The middle and right hepatic veins could not be visualized. Hepatic Artery Velocity:  162 cm/sec  Splenic Vein Velocity:  21 cm/sec Patent splenic vein with appropriate flow direction. Varices: Absent. Ascites: Small volume ascites. Spleen dimensions 10.7 x 4.4 x 12.2 cm (volume = 300 cm^3). Normal size spleen. IMPRESSION: Very limited scan, see comments. No acute hepatic vascular abnormality detected. CT abdomen/pelvis with IV contrast may be considered if there is continued clinical concern for acute hepatic vascular abnormality, given this scan's significant limitations. Small volume ascites. Electronically Signed   By: Delbert Phenix M.D.   On: 05/05/2017 20:06        Scheduled Meds: . aspirin EC  325 mg Oral Daily  . folic acid  1 mg Oral Daily  . furosemide  40 mg Oral Daily  . heparin  5,000 Units Subcutaneous Q8H  . lactulose  30 g Oral TID  . liver oil-zinc oxide   Topical BID  . multivitamin with minerals  1 tablet Oral Daily  . nystatin ointment   Topical BID  . pantoprazole  40 mg Oral QAC breakfast  . propranolol  10 mg Oral Daily  . rifaximin  550 mg Oral BID  . sodium chloride flush  3 mL Intravenous Q12H  . spironolactone  50 mg  Oral Daily  . thiamine  100 mg Oral Daily   Or  . thiamine  100 mg Intravenous Daily   Continuous Infusions:    LOS: 4 days    Time spent: 25 mins.More than 50% of that time was spent in counseling and/or coordination of care.      Meredith Leeds, MD [facility] Pager [phone]  If 7PM-7AM, please contact night-coverage [email or web address] Password [email or web address] 05/07/2017, 12:48 PM

## 2017-05-08 DIAGNOSIS — K7031 Alcoholic cirrhosis of liver with ascites: Secondary | ICD-10-CM

## 2017-05-08 DIAGNOSIS — K7290 Hepatic failure, unspecified without coma: Secondary | ICD-10-CM

## 2017-05-08 LAB — CBC WITH DIFFERENTIAL/PLATELET
BASOS ABS: 0 10*3/uL (ref 0.0–0.1)
Basophils Relative: 0 %
Eosinophils Absolute: 0.2 10*3/uL (ref 0.0–0.7)
Eosinophils Relative: 2 %
HEMATOCRIT: 30.4 % — AB (ref 39.0–52.0)
HEMOGLOBIN: 11.1 g/dL — AB (ref 13.0–17.0)
LYMPHS ABS: 1.8 10*3/uL (ref 0.7–4.0)
LYMPHS PCT: 18 %
MCH: 38.9 pg — ABNORMAL HIGH (ref 26.0–34.0)
MCHC: 36.5 g/dL — ABNORMAL HIGH (ref 30.0–36.0)
MCV: 106.7 fL — AB (ref 78.0–100.0)
Monocytes Absolute: 1.5 10*3/uL — ABNORMAL HIGH (ref 0.1–1.0)
Monocytes Relative: 16 %
NEUTROS ABS: 6.2 10*3/uL (ref 1.7–7.7)
NEUTROS PCT: 64 %
PLATELETS: 114 10*3/uL — AB (ref 150–400)
RBC: 2.85 MIL/uL — AB (ref 4.22–5.81)
RDW: 18.6 % — ABNORMAL HIGH (ref 11.5–15.5)
WBC: 9.7 10*3/uL (ref 4.0–10.5)

## 2017-05-08 LAB — COMPREHENSIVE METABOLIC PANEL
ALT: 73 U/L — ABNORMAL HIGH (ref 17–63)
ANION GAP: 9 (ref 5–15)
AST: 144 U/L — ABNORMAL HIGH (ref 15–41)
Albumin: 1.8 g/dL — ABNORMAL LOW (ref 3.5–5.0)
Alkaline Phosphatase: 98 U/L (ref 38–126)
BUN: 28 mg/dL — ABNORMAL HIGH (ref 6–20)
CHLORIDE: 99 mmol/L — AB (ref 101–111)
CO2: 19 mmol/L — ABNORMAL LOW (ref 22–32)
Calcium: 7.9 mg/dL — ABNORMAL LOW (ref 8.9–10.3)
Creatinine, Ser: 1.15 mg/dL (ref 0.61–1.24)
GFR calc Af Amer: >60 mL/min (ref >60)
Glucose, Bld: 112 mg/dL — ABNORMAL HIGH (ref 65–99)
POTASSIUM: 3.6 mmol/L (ref 3.5–5.1)
Sodium: 127 mmol/L — ABNORMAL LOW (ref 135–145)
TOTAL PROTEIN: 5.1 g/dL — AB (ref 6.5–8.1)
Total Bilirubin: 19.9 mg/dL (ref 0.3–1.2)

## 2017-05-08 LAB — CULTURE, BLOOD (ROUTINE X 2)
Culture: NO GROWTH
Culture: NO GROWTH
Special Requests: ADEQUATE
Special Requests: ADEQUATE

## 2017-05-08 LAB — AMMONIA: Ammonia: 61 umol/L — ABNORMAL HIGH (ref 9–35)

## 2017-05-08 MED ORDER — LACTULOSE ENEMA
300.0000 mL | Freq: Once | ORAL | Status: DC
  Filled 2017-05-08: qty 300

## 2017-05-08 MED ORDER — FLUCONAZOLE 100 MG PO TABS
200.0000 mg | ORAL_TABLET | Freq: Once | ORAL | Status: AC
  Administered 2017-05-08: 200 mg via ORAL
  Filled 2017-05-08: qty 2

## 2017-05-08 MED ORDER — PHYTONADIONE 5 MG PO TABS
5.0000 mg | ORAL_TABLET | Freq: Every day | ORAL | Status: AC
  Administered 2017-05-08 – 2017-05-09 (×2): 5 mg via ORAL
  Filled 2017-05-08 (×2): qty 1

## 2017-05-08 MED ORDER — PREDNISOLONE 5 MG PO TABS
40.0000 mg | ORAL_TABLET | Freq: Every day | ORAL | Status: DC
  Administered 2017-05-08 – 2017-05-14 (×7): 40 mg via ORAL
  Filled 2017-05-08 (×7): qty 8

## 2017-05-08 MED ORDER — FLUCONAZOLE 100 MG PO TABS
100.0000 mg | ORAL_TABLET | Freq: Every day | ORAL | Status: AC
  Administered 2017-05-09 – 2017-05-14 (×6): 100 mg via ORAL
  Filled 2017-05-08 (×8): qty 1

## 2017-05-08 MED ORDER — LACTULOSE 10 GM/15ML PO SOLN
20.0000 g | Freq: Three times a day (TID) | ORAL | Status: DC
  Administered 2017-05-08 – 2017-05-11 (×8): 20 g via ORAL
  Filled 2017-05-08 (×8): qty 30

## 2017-05-08 NOTE — Progress Notes (Signed)
Daily Rounding Note  05/08/2017, 1:26 PM  LOS: 5 days   SUBJECTIVE:      Having ~ 5 stools per day.  Not confused.  hospitalist added Lactulose enema today due to rising ammonia level.   Pt denies abd pain.    OBJECTIVE:         Vital signs in last 24 hours:    Temp:  [98.6 F (37 C)-99.1 F (37.3 C)] 98.6 F (37 C) (03/08 1256) Pulse Rate:  [75-115] 75 (03/08 1256) Resp:  [18] 18 (03/08 1256) BP: (93-138)/(63-124) 94/63 (03/08 1256) SpO2:  [92 %-95 %] 95 % (03/08 1256) Last BM Date: 05/07/17 Filed Weights   05/03/17 1532 05/04/17 0359  Weight: 228 lb 2.8 oz (103.5 kg) 231 lb 0.7 oz (104.8 kg)   General: looks moderately ill.  Jaundiced but not as yellow as expected based on T bili of 20  Heart: RRR Chest: clear bil.  No labored breathing Abdomen: tense, NT, protuberant.  Umbilical hernia.  Active BS  Soft, mushy stools pooling in his groin.   Extremities: no CCE.  Feet warm.   Neuro/Psych:  Oriented x 3. Not confused. Some asterixis.  Alert, speaking but has little to say.    Intake/Output from previous day: 03/07 0701 - 03/08 0700 In: 120 [P.O.:120] Out: -   Intake/Output this shift: Total I/O In: 240 [P.O.:240] Out: -   Lab Results: Recent Labs    05/06/17 0424 05/08/17 0444  WBC 8.0 9.7  HGB 11.1* 11.1*  HCT 30.1* 30.4*  PLT 101* 114*   BMET Recent Labs    05/06/17 0424 05/07/17 0433 05/08/17 0444  NA 131* 127* 127*  K 3.4* 3.6 3.6  CL 101 99* 99*  CO2 22 20* 19*  GLUCOSE 124* 109* 112*  BUN 28* 26* 28*  CREATININE 0.86 1.01 1.15  CALCIUM 8.2* 7.8* 7.9*   LFT Recent Labs    05/06/17 0424 05/07/17 0433 05/08/17 0444  PROT 5.2* 5.0* 5.1*  ALBUMIN 1.9* 1.7* 1.8*  AST 148* 135* 144*  ALT 79* 73* 73*  ALKPHOS 95 96 98  BILITOT 20.7* 19.1* 19.9*    Scheduled Meds: . aspirin EC  325 mg Oral Daily  . [START ON 05/09/2017] fluconazole  100 mg Oral Daily  . folic acid  1 mg Oral  Daily  . furosemide  40 mg Oral Daily  . heparin  5,000 Units Subcutaneous Q8H  . lactulose  30 g Oral TID  . lactulose  300 mL Rectal Once  . liver oil-zinc oxide   Topical BID  . multivitamin with minerals  1 tablet Oral Daily  . nystatin ointment   Topical BID  . pantoprazole  40 mg Oral QAC breakfast  . propranolol  10 mg Oral Daily  . rifaximin  550 mg Oral BID  . sodium chloride flush  3 mL Intravenous Q12H  . spironolactone  100 mg Oral Daily  . thiamine  100 mg Oral Daily   Or  . thiamine  100 mg Intravenous Daily   Continuous Infusions: PRN Meds:.ondansetron **OR** ondansetron (ZOFRAN) IV  ASSESMENT:   *   Cirrhosis of liver, acute ETOH hepatitis.  Decompensated with jaundice, discriminant fx score 61, MELD 40.  Held off from starting Prednisolone until any, all active infections ruled out. Empiric zosyn discontinued, last dose was 3/6, and no fever, WBC elevation.   Not much change in LFTs.  Hepatic cyst, small ascites, GB sludge  no HCC on ultrasound 3/5  *  Coagulopathy, worsening as of latest 05/05/17 assay.    *   HE. Oral lactulose and Rifaximin in place.   Ammonia 65 >> 59 >> 61.    *   AKI, improved.    *   Ascites.  No SBP, + mesothelial cells on 650 ml tap 05/03/17.  Aldactone 100 mg added today to Lasix 40 mg daily. Home med list: Aldactone 50 mg/day, Furosemide 40 mg/day.      *   Hyponatremia.    *  Macrocytosis.  Stable Hgb ~ 11.    *  No prioe Screening EDG, no known varices and no hx acute GIB.  Is receiving Propranolol     PLAN   *   Started Prednisolone 40 mg daily.  Advised RN she did not need to administer the 1 time Lactulose enema.  In fact am reducing Lactulose to 20 gm TID as he is incontinent and needs only to have 3 stools per day. ? begin      *   PT/INR, CMET in AM.    *  Though does not seen to have responded to a 1 x dose of Vit K on 3/7, will continue this another 2 days as there is little downside to this.     Jennye Moccasin   05/08/2017, 1:26 PM Pager: [phone]

## 2017-05-08 NOTE — Progress Notes (Signed)
PROGRESS NOTE    Gary KEZIAH  [name]:[id number] DOB: 30-Sep-1950 DOA: 05/03/2017 PCP: [name], NP   Brief Narrative: Gary [name] a 67 y.o.malepast medical history of arthritis, hypertension, reflux, alcohol abuse, lower extremity edema, heart failure presents to the emergency room with chief complaint of weakness and jaundice. Patient states that he has been feeling weaker over the last 4 weeks. He also had several weeks of red painful scrotum and red irritated gluteal cleft. Patient was found to have elevated bilirubin.. GI consulted.  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   Assessment & Plan:   Principal Problem:   Hyperbilirubinemia Active Problems:   Hyponatremia   AKI (acute kidney injury) (HCC)   Jaundice   Encephalopathy, hepatic (HCC)   Junctional rhythm   Pressure injury of skin  Acute decompensated alcoholic liver cirrhosis: Maddrey Discriminant Function Score was >60; Defer to Gastroenterology to start Prednisolone. C/w Furosemide 40 mg po Daily; Spironolactone also started  Underwent Paracentesis to exclude SBP; Cytology showed just inflammatory cells C/w Zosyn for SBP Prophylaxis; WBC in Peritoneal  Fluid was 181 C/w Propanolol 10 mg po Daily  Counseled on EtOH Abuse US abdomen on 05/05/17  showed:Hepatic cirrhosis with stable left hepatic cyst measuring approximately 1.4 x 1.1 x 1.5 cm, Small volume of ascites GI planning for repeating EGD to rule out varices. He might need repeat tap in few days Given his poor prognosis,we will request for palliative care evaluation. I will also give a call to his sister.  Junctional rhythm: Twelve-lead EKG showed junctional rhythm .  Discussed with cardiology , Dr. Duke Salvia who evaluated the EKG and concluded no intervention needed. Patient in sinus tachycardia  Alcoholic hepatitis: Liver enzymes improving.  GI following and holding on starting prednisone until  active infection is ruled out.  Elevated ammonia level: Continue lactulose.Added rifaximin, because patient is hesitant to take lactulose and his ammonia level is still high. Ammonia level still high today so he will be given lactulose enema once.Will continue to follow up  Hyponatremia: We will continue to monitor  Lactic acidosis: Improved  Scrotal fungal skin infection: Continue nystatin and scrotal support.Addede fluconazole  Hypertension: Continue propranolol  Generalized weakness: Physical therapy evaluate the patient and recommended skilled nursing facility.  Acute kidney injury: Kidney function has improved with hydration.    GERD: Continue PPI  Anemia/Thrombocytopenia: Likely secondary to cirrhosis/alcohol.  Currently stable.  Continue folic acid and thiamine    DVT prophylaxis:Hep SubC Code Status: Full Family Communication: None present at the bedside Disposition Plan: Skilled nursing facility after GI clearance,palliative care evaluation  Consultants: Gastroenterology, cardiology  Procedures: Paracentesis  Antimicrobials:Zosyn since 05/03/17-05/07/17  Subjective: Patient seen and examined the bedside this morning.  Was sleeping but easily arousable.  He is not in any kind of distress.  Denies any abdominal pain or shortness of breath.  Objective: Vitals:   05/07/17 2034 05/08/17 0357 05/08/17 0500 05/08/17 1256  BP: 101/63 93/63  94/63  Pulse: (!) 111 (!) 115 87 75  Resp: 18 18  18   Temp: 99.1 F (37.3 C) 98.9 F (37.2 C)  98.6 F (37 C)  TempSrc: Oral Oral  Oral  SpO2: 93% 92%  95%  Weight:      Height:        Intake/Output Summary (Last 24 hours) at 05/08/2017 1257 Last data filed at 05/08/2017 0800 Gross per 24 hour  Intake 360 ml  Output -  Net 360 ml  Filed Weights   05/03/17 1532 05/04/17 0359  Weight: 103.5 kg (228 lb 2.8 oz) 104.8 kg (231 lb 0.7 oz)    Examination:  General exam: Appears calm and comfortable ,Not in  distress,obese HEENT:PERRL,Oral mucosa moist, Ear/Nose normal on gross exam Respiratory system: Bilateral equal air entry, normal vesicular breath sounds, no wheezes or crackles  Cardiovascular system: S1 & S2 heard, RRR. No JVD, murmurs, rubs, gallops or clicks. Gastrointestinal system: Abdomen is distended, soft and nontender. No organomegaly or masses felt. Normal bowel sounds heard. Central nervous system: Alert and oriented,slow to respond. No focal neurological deficits. Extremities: No edema, no clubbing ,no cyanosis, distal peripheral pulses palpable. Skin: Icterus,No rashes, lesions or ulcers,,no pallor MSK: Normal muscle bulk,tone ,power Psychiatry: Judgement and insight appear normal. Mood & affect appropriate.     Data Reviewed: I have personally reviewed following labs and imaging studies  CBC: Recent Labs  Lab 05/03/17 0843 05/04/17 0749 05/05/17 0440 05/06/17 0424 05/08/17 0444  WBC 9.9 6.9 7.5 8.0 9.7  NEUTROABS 7.2 4.7 4.5 4.7 6.2  HGB 11.1* 11.0* 11.1* 11.1* 11.1*  HCT 29.6* 30.0* 30.0* 30.1* 30.4*  MCV 103.1* 104.2* 104.2* 105.6* 106.7*  PLT 115* 91* 92* 101* 114*   Basic Metabolic Panel: Recent Labs  Lab 05/04/17 0749 05/04/17 1212 05/05/17 0440 05/06/17 0424 05/07/17 0433 05/08/17 0444  NA 130* 128* 131* 131* 127* 127*  K 3.7 3.5 3.5 3.4* 3.6 3.6  CL 100* 98* 102 101 99* 99*  CO2 19* 20* 20* 22 20* 19*  GLUCOSE 102* 136* 110* 124* 109* 112*  BUN 39* 35* 31* 28* 26* 28*  CREATININE 1.21 1.32* 0.94 0.86 1.01 1.15  CALCIUM 8.6* 8.3* 8.4* 8.2* 7.8* 7.9*  MG 1.5*  --  1.8 1.6* 1.9  --   PHOS 2.9  --  2.7 2.7  --   --    GFR: Estimated Creatinine Clearance: 75.6 mL/min (by C-G formula based on SCr of 1.15 mg/dL). Liver Function Tests: Recent Labs  Lab 05/04/17 0749 05/05/17 0440 05/06/17 0424 05/07/17 0433 05/08/17 0444  AST 165* 158* 148* 135* 144*  ALT 78* 80* 79* 73* 73*  ALKPHOS 99 97 95 96 98  BILITOT 22.7* 22.6* 20.7* 19.1* 19.9*   PROT 5.5* 5.3* 5.2* 5.0* 5.1*  ALBUMIN 2.2* 2.1* 1.9* 1.7* 1.8*   Recent Labs  Lab 05/03/17 0843  LIPASE 133*   Recent Labs  Lab 05/03/17 0845 05/07/17 0433 05/08/17 0444  AMMONIA 65* 59* 61*   Coagulation Profile: Recent Labs  Lab 05/03/17 0843 05/05/17 1046  INR 2.08 2.18   Cardiac Enzymes: Recent Labs  Lab 05/03/17 0843  CKTOTAL 135   BNP (last 3 results) No results for input(s): PROBNP in the last 8760 hours. HbA1C: No results for input(s): HGBA1C in the last 72 hours. CBG: Recent Labs  Lab 05/06/17 1153 05/06/17 1728  GLUCAP 126* 131*   Lipid Profile: No results for input(s): CHOL, HDL, LDLCALC, TRIG, CHOLHDL, LDLDIRECT in the last 72 hours. Thyroid Function Tests: No results for input(s): TSH, T4TOTAL, FREET4, T3FREE, THYROIDAB in the last 72 hours. Anemia Panel: No results for input(s): VITAMINB12, FOLATE, FERRITIN, TIBC, IRON, RETICCTPCT in the last 72 hours. Sepsis Labs: Recent Labs  Lab 05/03/17 1610 05/03/17 1535 05/03/17 1831  LATICACIDVEN 4.43* 2.2* 1.6    Recent Results (from the past 240 hour(s))  Culture, blood (routine x 2)     Status: None (Preliminary result)   Collection Time: 05/03/17  8:44 AM  Result Value Ref  Range Status   Specimen Description BLOOD RIGHT ANTECUBITAL  Final   Special Requests   Final    BOTTLES DRAWN AEROBIC AND ANAEROBIC Blood Culture adequate volume Performed at [facility], [address], [place], [place] [address]    Culture   Final    NO GROWTH 4 DAYS Performed at [facility], [address] [place], [place] [address]    Report Status PENDING  Incomplete  Culture, blood (routine x 2)     Status: None (Preliminary result)   Collection Time: 05/03/17  8:49 AM  Result Value Ref Range Status   Specimen Description BLOOD LEFT ANTECUBITAL  Final   Special Requests   Final    BOTTLES DRAWN AEROBIC AND ANAEROBIC Blood Culture adequate volume Performed at [facility], [address], [place], [place] [address]    Culture   Final    NO GROWTH 4 DAYS Performed at [facility], [address], [place], [place] [address]    Report Status PENDING  Incomplete  Gram stain     Status: None   Collection Time: 05/03/17  2:30 PM  Result Value Ref Range Status   Specimen Description ABDOMEN FLUID  Final   Special Requests NONE  Final   Gram Stain   Final    WBC PRESENT,BOTH PMN AND MONONUCLEAR NO ORGANISMS SEEN CRITICAL RESULT CALLED TO, READ BACK BY AND VERIFIED WITH: J.Baruch Merl, RN 05/03/17 @1938  BY V.WILKINS Performed at [facility] [address], [place], [place] [address]    Report Status 05/03/2017 FINAL  Final         Radiology Studies: No results found.      Scheduled Meds: . aspirin EC  325 mg Oral Daily  . [START ON 05/09/2017] fluconazole  100 mg Oral Daily  . folic acid  1 mg Oral Daily  . furosemide  40 mg Oral Daily  . heparin  5,000 Units Subcutaneous Q8H  . lactulose  30 g Oral TID  . lactulose  300 mL Rectal Once  . liver oil-zinc oxide   Topical BID  . multivitamin with minerals  1 tablet Oral Daily  . nystatin ointment   Topical BID  . pantoprazole  40 mg Oral QAC breakfast  . propranolol  10 mg Oral Daily  . rifaximin  550 mg Oral BID  . sodium chloride flush  3 mL Intravenous Q12H  . spironolactone  100 mg Oral Daily  . thiamine  100 mg Oral Daily   Or  . thiamine  100 mg Intravenous Daily   Continuous Infusions:    LOS: 5 days    Time spent: 25 mins.More than 50% of that time was spent in counseling and/or coordination of care.      Meredith Leeds, MD [facility] Pager [phone]  If 7PM-7AM, please contact night-coverage [email or web address] Password [facility] 05/08/2017, 12:57 PM

## 2017-05-08 NOTE — Progress Notes (Signed)
Palliative Medicine consult noted. Due to high referral volume, there may be a delay seeing this patient. Please call the Palliative Medicine Team office at [phone] if recommendations are needed in the interim.  Thank you for inviting [place] to see this patient.  Margret Chance Ashir Kunz, RN, BSN, [facility] Palliative Medicine Team 05/08/2017 1:09 PM Office [phone]

## 2017-05-08 NOTE — Progress Notes (Signed)
Pt will discharge home with [facility] for HH/PT/RN.

## 2017-05-08 NOTE — Progress Notes (Signed)
Physical Therapy Treatment Patient Details Name: Gary [name] MRN: [id number] DOB: 01-16-1951 Today's Date: 05/08/2017    History of Present Illness 67 y.o. male past medical history of arthritis, hypertension, reflux, alcohol abuse, lower extremity edema, heart failure presents to the emergency room with chief complaint of weakness and jaundice and admitted for Hyperbilirubinemia    PT Comments    Pt progressing slowly with his mobility.  Tolerated an increased distance however very unsteady, shay gait with poor self correction reaction to midline.  HIGH FALL RISK.  Pt will need ST Rehab at SNF.   Follow Up Recommendations  SNF     Equipment Recommendations  None recommended by PT    Recommendations for Other Services       Precautions / Restrictions Precautions Precautions: Fall Restrictions Weight Bearing Restrictions: No    Mobility  Bed Mobility Overal bed mobility: Needs Assistance Bed Mobility: Supine to Sit;Sit to Supine     Supine to sit: Mod assist Sit to supine: Mod assist   General bed mobility comments: Assist for trunk and bil LEs. Increased time. Utilized bedpad for scooting, positioning.   Transfers Overall transfer level: Needs assistance Equipment used: Rolling walker (2 wheeled) Transfers: Sit to/from Stand Sit to Stand: Min assist;From elevated surface         General transfer comment: Assist to rise, stabilize. Increased time.   Ambulation/Gait Ambulation/Gait assistance: Min assist Ambulation Distance (Feet): 35 Feet   Gait Pattern/deviations: Step-through pattern;Staggering left;Staggering right Gait velocity: decreased   General Gait Details: unsteady gait with heavy lean on walker and instability esp with turns.  HIGH FALL RISK   Stairs            Wheelchair Mobility    Modified Rankin (Stroke Patients Only)       Balance                                            Cognition Arousal/Alertness:  Awake/alert Behavior During Therapy: Flat affect Overall Cognitive Status: Within Functional Limits for tasks assessed                                        Exercises      General Comments        Pertinent Vitals/Pain Pain Assessment: No/denies pain    Home Living                      Prior Function            PT Goals (current goals can now be found in the care plan section) Progress towards PT goals: Progressing toward goals    Frequency    Min 3X/week      PT Plan Current plan remains appropriate    Co-evaluation              AM-PAC PT "6 Clicks" Daily Activity  Outcome Measure  Difficulty turning over in bed (including adjusting bedclothes, sheets and blankets)?: A Lot Difficulty moving from lying on back to sitting on the side of the bed? : Unable Difficulty sitting down on and standing up from a chair with arms (e.g., wheelchair, bedside commode, etc,.)?: Unable Help needed moving to and from a bed to chair (including a wheelchair)?: A Lot Help  needed walking in hospital room?: A Lot Help needed climbing 3-5 steps with a railing? : Total 6 Click Score: 9    End of Session Equipment Utilized During Treatment: Gait belt Activity Tolerance: Patient limited by fatigue Patient left: in bed;with call bell/phone within reach;with bed alarm set Nurse Communication: Mobility status PT Visit Diagnosis: Unsteadiness on feet (R26.81);Difficulty in walking, not elsewhere classified (R26.2)     Time: 1610-9604 PT Time Calculation (min) (ACUTE ONLY): 23 min  Charges:  $Gait Training: 8-22 mins $Therapeutic Activity: 8-22 mins                    G Codes:       Felecia Shelling  PTA [facility]  Acute  Rehab Pager      [phone]

## 2017-05-09 LAB — COMPREHENSIVE METABOLIC PANEL
ALBUMIN: 1.7 g/dL — AB (ref 3.5–5.0)
ALK PHOS: 102 U/L (ref 38–126)
ALT: 68 U/L — AB (ref 17–63)
AST: 129 U/L — ABNORMAL HIGH (ref 15–41)
Anion gap: 7 (ref 5–15)
BUN: 31 mg/dL — AB (ref 6–20)
CALCIUM: 8 mg/dL — AB (ref 8.9–10.3)
CHLORIDE: 99 mmol/L — AB (ref 101–111)
CO2: 22 mmol/L (ref 22–32)
CREATININE: 1.08 mg/dL (ref 0.61–1.24)
GFR calc Af Amer: >60 mL/min (ref >60)
GFR calc non Af Amer: >60 mL/min (ref >60)
Glucose, Bld: 130 mg/dL — ABNORMAL HIGH (ref 65–99)
Potassium: 4.2 mmol/L (ref 3.5–5.1)
SODIUM: 128 mmol/L — AB (ref 135–145)
Total Bilirubin: 20 mg/dL (ref 0.3–1.2)
Total Protein: 5 g/dL — ABNORMAL LOW (ref 6.5–8.1)

## 2017-05-09 LAB — AMMONIA: Ammonia: 79 umol/L — ABNORMAL HIGH (ref 9–35)

## 2017-05-09 NOTE — Progress Notes (Signed)
PROGRESS NOTE    Gary [name]  ZOX:[id number] DOB: September 29, 1950 DOA: 05/03/2017 PCP: [name], NP   Brief Narrative: [name] a 67 y.o.malepast medical history of arthritis, hypertension, reflux, alcohol abuse, lower extremity edema, heart failure presents to the emergency room with chief complaint of weakness and jaundice. Patient states that he has been feeling weaker over the last 4 weeks. He also had several weeks of red painful scrotum and red irritated gluteal cleft.  History of chronic alcohol abuse. Patient was found to have elevated bilirubin,INR and  LFT on presentation. GI following.  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 Due to his poor prognosis, palliative care is also involved in the management.   Assessment & Plan:   Principal Problem:   Hyperbilirubinemia Active Problems:   Hyponatremia   AKI (acute kidney injury) (HCC)   Jaundice   Encephalopathy, hepatic (HCC)   Junctional rhythm   Pressure injury of skin  Acute decompensated alcoholic liver cirrhosis: Maddrey Discriminant Function Score was >60; Started on  Prednisolone. C/w Furosemide 40 mg po Daily; Spironolactone also started  Underwent Paracentesis to exclude SBP; Cytology showed just inflammatory cells C/w Zosyn for SBP Prophylaxis; WBC in Peritoneal  Fluid was 181 C/w Propanolol 10 mg po Daily  Counseled on EtOH Abuse US abdomen on 05/05/17  showed:Hepatic cirrhosis with stable left hepatic cyst measuring approximately 1.4 x 1.1 x 1.5 cm, Small volume of ascites GI planning for repeating EGD to rule out varices in near future He might need repeat tap in few days Given his poor prognosis,palliative care is also following.  Junctional rhythm ?: Twelve-lead EKG showed junctional rhythm .  Discussed with cardiology , Dr. Duke Salvia who evaluated the EKG and concluded no intervention needed. Patient in sinus tachycardia.HR better controlled  today  Alcoholic hepatitis: Liver enzymes improving.  GI following started prednisolone .  Elevated ammonia level: Continue lactulose.Added rifaximin, because patient is hesitant to take lactulose and his ammonia level is still high. Ammonia level still high today .  Mental status has not deteriorated and he is currently alert and oriented.  Hyponatremia: We will continue to monitor  Lactic acidosis: Improved  Scrotal fungal skin infection: Continue nystatin and scrotal support.Added fluconazole  Hypertension: Continue propranolol  Generalized weakness: Physical therapy evaluate the patient and recommended skilled nursing facility.  Acute kidney injury: Kidney function has improved with hydration.    GERD: Continue PPI  Anemia/Thrombocytopenia: Likely secondary to cirrhosis/alcoholism.  Currently stable. Continue folic acid and thiamine    DVT prophylaxis:SCD Code Status: Full Family Communication: None present at the bedside Disposition Plan: Unknown at this time  Consultants: Gastroenterology  Procedures: Paracentesis  Antimicrobials:Zosyn since 05/03/17-05/07/17  Subjective: Patient seen and examined the bedside this morning.  Remains in generalized weakness but he is alert and oriented.  Patient is in denial after being told about his  poor prognosis and was visibly concerned.  Objective: Vitals:   05/08/17 1256 05/08/17 2121 05/09/17 0509 05/09/17 0934  BP: 94/63 97/60 (!) 94/58 100/62  Pulse: 75 77 (!) 112 (!) 117  Resp: 18 18 18    Temp: 98.6 F (37 C) 98.8 F (37.1 C) 99 F (37.2 C)   TempSrc: Oral Oral Oral   SpO2: 95% 96% 94% 95%  Weight:      Height:        Intake/Output Summary (Last 24 hours) at 05/09/2017 1353 Last data filed at 05/09/2017 1321 Gross per 24 hour  Intake 743 ml  Output 2 ml  Net 741 ml   Filed Weights   05/03/17 1532 05/04/17 0359  Weight: 103.5 kg (228 lb 2.8 oz) 104.8 kg (231 lb 0.7 oz)    Examination:  General exam: Not in  obvious distress, chronically ill, severe generalized weakness HEENT:PERRL,Oral mucosa moist, Ear/Nose normal on gross exam Respiratory system: Bilateral decreased air entry in the bases, normal vesicular breath sounds, no wheezes or crackles  Cardiovascular system: S1 & S2 heard, RRR. No JVD, murmurs, rubs, gallops or clicks. Gastrointestinal system: Abdomen is distended, slightly tense, soft and nontender. No organomegaly or masses felt. Normal bowel sounds heard. Central nervous system: Alert and oriented. No focal neurological deficits. Extremities: 1+  edema, no clubbing ,no cyanosis, distal peripheral pulses palpable. Skin: Severely icteric, no rashes, lesions or ulcers, ,no pallor Psych: Appears depressed  Data Reviewed: I have personally reviewed following labs and imaging studies  CBC: Recent Labs  Lab 05/03/17 0843 05/04/17 0749 05/05/17 0440 05/06/17 0424 05/08/17 0444  WBC 9.9 6.9 7.5 8.0 9.7  NEUTROABS 7.2 4.7 4.5 4.7 6.2  HGB 11.1* 11.0* 11.1* 11.1* 11.1*  HCT 29.6* 30.0* 30.0* 30.1* 30.4*  MCV 103.1* 104.2* 104.2* 105.6* 106.7*  PLT 115* 91* 92* 101* 114*   Basic Metabolic Panel: Recent Labs  Lab 05/04/17 0749  05/05/17 0440 05/06/17 0424 05/07/17 0433 05/08/17 0444 05/09/17 0502  NA 130*   < > 131* 131* 127* 127* 128*  K 3.7   < > 3.5 3.4* 3.6 3.6 4.2  CL 100*   < > 102 101 99* 99* 99*  CO2 19*   < > 20* 22 20* 19* 22  GLUCOSE 102*   < > 110* 124* 109* 112* 130*  BUN 39*   < > 31* 28* 26* 28* 31*  CREATININE 1.21   < > 0.94 0.86 1.01 1.15 1.08  CALCIUM 8.6*   < > 8.4* 8.2* 7.8* 7.9* 8.0*  MG 1.5*  --  1.8 1.6* 1.9  --   --   PHOS 2.9  --  2.7 2.7  --   --   --    < > = values in this interval not displayed.   GFR: Estimated Creatinine Clearance: 80.5 mL/min (by C-G formula based on SCr of 1.08 mg/dL). Liver Function Tests: Recent Labs  Lab 05/05/17 0440 05/06/17 0424 05/07/17 0433 05/08/17 0444 05/09/17 0502  AST 158* 148* 135* 144* 129*  ALT  80* 79* 73* 73* 68*  ALKPHOS 97 95 96 98 102  BILITOT 22.6* 20.7* 19.1* 19.9* 20.0*  PROT 5.3* 5.2* 5.0* 5.1* 5.0*  ALBUMIN 2.1* 1.9* 1.7* 1.8* 1.7*   Recent Labs  Lab 05/03/17 0843  LIPASE 133*   Recent Labs  Lab 05/03/17 0845 05/07/17 0433 05/08/17 0444 05/09/17 0502  AMMONIA 65* 59* 61* 79*   Coagulation Profile: Recent Labs  Lab 05/03/17 0843 05/05/17 1046  INR 2.08 2.18   Cardiac Enzymes: Recent Labs  Lab 05/03/17 0843  CKTOTAL 135   BNP (last 3 results) No results for input(s): PROBNP in the last 8760 hours. HbA1C: No results for input(s): HGBA1C in the last 72 hours. CBG: Recent Labs  Lab 05/06/17 1153 05/06/17 1728  GLUCAP 126* 131*   Lipid Profile: No results for input(s): CHOL, HDL, LDLCALC, TRIG, CHOLHDL, LDLDIRECT in the last 72 hours. Thyroid Function Tests: No results for input(s): TSH, T4TOTAL, FREET4, T3FREE, THYROIDAB in the last 72 hours. Anemia Panel: No results for input(s): VITAMINB12, FOLATE, FERRITIN, TIBC,  IRON, RETICCTPCT in the last 72 hours. Sepsis Labs: Recent Labs  Lab 05/03/17 3419 05/03/17 1535 05/03/17 1831  LATICACIDVEN 4.43* 2.2* 1.6    Recent Results (from the past 240 hour(s))  Culture, blood (routine x 2)     Status: None   Collection Time: 05/03/17  8:44 AM  Result Value Ref Range Status   Specimen Description BLOOD RIGHT ANTECUBITAL  Final   Special Requests   Final    BOTTLES DRAWN AEROBIC AND ANAEROBIC Blood Culture adequate volume Performed at [facility], [address], [place], [place] [address]    Culture   Final    NO GROWTH 5 DAYS Performed at [facility], [address], [place], [place] [address]    Report Status 05/08/2017 FINAL  Final  Culture, blood (routine x 2)     Status: None   Collection Time: 05/03/17  8:49 AM  Result Value Ref Range Status   Specimen Description BLOOD LEFT ANTECUBITAL  Final   Special Requests   Final    BOTTLES DRAWN AEROBIC AND  ANAEROBIC Blood Culture adequate volume Performed at [facility], [address] [place], [place] [address]    Culture   Final    NO GROWTH 5 DAYS Performed at [facility], [address], [place], [place] [address]    Report Status 05/08/2017 FINAL  Final  Gram stain     Status: None   Collection Time: 05/03/17  2:30 PM  Result Value Ref Range Status   Specimen Description ABDOMEN FLUID  Final   Special Requests NONE  Final   Gram Stain   Final    WBC PRESENT,BOTH PMN AND MONONUCLEAR NO ORGANISMS SEEN CRITICAL RESULT CALLED TO, READ BACK BY AND VERIFIED WITH: J.Baruch Merl, RN 05/03/17 @1938  BY V.WILKINS Performed at [facility], [address] [place], [place] [address]    Report Status 05/03/2017 FINAL  Final         Radiology Studies: No results found.      Scheduled Meds: . aspirin EC  325 mg Oral Daily  . fluconazole  100 mg Oral Daily  . folic acid  1 mg Oral Daily  . furosemide  40 mg Oral Daily  . heparin  5,000 Units Subcutaneous Q8H  . lactulose  20 g Oral TID  . lactulose  300 mL Rectal Once  . liver oil-zinc oxide   Topical BID  . multivitamin with minerals  1 tablet Oral Daily  . nystatin ointment   Topical BID  . pantoprazole  40 mg Oral QAC breakfast  . prednisoLONE  40 mg Oral Daily  . propranolol  10 mg Oral Daily  . rifaximin  550 mg Oral BID  . sodium chloride flush  3 mL Intravenous Q12H  . spironolactone  100 mg Oral Daily  . thiamine  100 mg Oral Daily   Or  . thiamine  100 mg Intravenous Daily   Continuous Infusions:    LOS: 6 days    Time spent: 25 mins.More than 50% of that time was spent in counseling and/or coordination of care.      Meredith Leeds, MD [facility] Pager [phone]  If 7PM-7AM, please contact night-coverage [email or web address] Password [email or web address] 05/09/2017, 1:53 PM

## 2017-05-09 NOTE — Progress Notes (Addendum)
Pt was in bed and alert and talking when I arrived. His wife was bedside. When about his desire to execute advance directives/HCPOA, pt said he did it over a year ago, at [facility], and does not have any changes at this point. He said he wanted his sister to be his HCPOA. Pt said he rec'd information from the doctor this morning that bothered him, regarding the need for a liver transplant and that it would take a year to get it. He talked about the diet changes he would have to make before he could receive the liver. Pt's overall was accepting of what he needs to do and of the news of the need for a liver. Pt's wife, who spoke [place], offered support. Please page if additional assitance is needed. Chaplain Elmarie Shiley [place], [place]   05/09/17 1700  Clinical Encounter Type  Visited With Patient and family together

## 2017-05-09 NOTE — Progress Notes (Signed)
CRITICAL VALUE ALERT  Critical Value:  Ammonia 79  Date & Time Notied:  05/09/17 0750  Provider Notified: Claire Shown   Orders Received/Actions taken: MD acknowledged ammonia level, no new orders received, already on LActulose, mental status not altered at this time

## 2017-05-09 NOTE — Plan of Care (Signed)
Patient stable, up and down to bathroom multiple times with one assist and walker d/t lactulose and frequent bowel movements (5-6 loose stools this shift).  Patient does feel like he is getting stronger.  Maintains oxygen saturation in the 90's on room air.  Wife at bedside.

## 2017-05-09 NOTE — Progress Notes (Signed)
Wife in to visit patient, patient very upset after talking to palliative care doctor saying he has always been told his liver was getting better.  I discussed with patient the role of palliative care.  Wife speaks mandarin [place], spoke with wife utilizing interpreter.  Wife also states that she has never been told he had a liver problem.  Discussed with wife in presence of patient cirrhosis and the most important thing is that patient can not drink any alcohol whatsoever.  Wife states she cannot control what patient does especially since she works.

## 2017-05-09 NOTE — Consult Note (Signed)
Consultation Note Date: 05/09/2017   Patient Name: Gary [name]  DOB: 01-28-1951  MRN: [id number]  Age / Sex: 68 y.o., male  PCP: [name], NP Referring Physician: Meredith Leeds, MD  Reason for Consultation: Establishing goals of care  HPI/Patient Profile: 67 y.o. male  admitted on 05/03/2017    Clinical Assessment and Goals of Care:  67 year old gentleman with decompensated cirrhosis/probable superimposed on chronic hepatitis. He is being followed by gastroenterology. He is admitted to hospital medicine service with high suspicion of all colic hepatitis and has been started on steroids. He is reported to have a meld score of 40. He is noted to have poor prognosis. Initially also came in with acute kidney injury that has since resolved. Hepatic encephalopathy has improved. Patient continues on lactulose. Patient is having multiple loose stools. Palliative medicine consultation for goals of care discussions.  The patient is resting in bed. He obviously appears icteric and with generalized weakness. Is attempting to feed himself breakfast. I introduced myself and palliative care as follows: Palliative medicine is specialized medical care for people living with serious illness. It focuses on providing relief from the symptoms and stress of a serious illness. The goal is to improve quality of life for both the patient and the family.  We discussed about the patient's serious illness, nature of this hospitalization. Goals wishes and values discussed. Discussed about CODE STATUS in detail. Described that the full scope of a resuscitative attempt-CPR, intubation, mechanical ventilation might cause more harm than benefit given his serious illness and ongoing decline. He wishes to continue to think about our discussion today as well as discuss further with his sister.  He requested that I call his sister.  Call  placed and discussed with patient's sister Oren Bracket. [place] Her husband died after a sudden cancer diagnosis just a few months ago. She has a special needs son at home. Their father is an assisted living in [place]. She states that she is well aware that the patient has decompensated cirrhosis resulting from EtOH use. She states that they have had several discussions over the years about his stopping drinking but the patient has not. She states that she has already completed making funeral arrangements for him because she realizes how sick he is and that she states that she knows he might not have long to live.  We discussed about medical issues pertinent to this hospitalization. We discussed about appropriate medical management as is being attempted by gastroenterology and hospital medicine colleagues. We discussed frankly that in spite of best medical measures, the patient could have ongoing decline or sudden decompensation.  See additional discussions/recommendations below. Thank you for the consult.  NEXT OF KIN  Is married, lives with wife. 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  Has a sister Oren Bracket who he likes to make medical decisions on his behalf.  Reportedly also has a 67 year old in a 67 year old from prior 2 different relationships. Reportedly does  not have children with his current wife.    SUMMARY OF RECOMMENDATIONS    1. Full code for now. Continuing ongoing discussions regarding appropriate less of establishing DO NOT RESUSCITATE/DO NOT INTUBATE.  2. Chaplain consult for completion of advance directives. Choices discussed. Patient elects her sister Oren Bracket to make healthcare decisions for him whenever he is not able to.  3. Continue current mode of care. Continue conversations with patient, sister to determine appropriate disposition. Patient will need skilled nursing facility has a rehabilitation  attempt with palliative care following. However, should he have acute decline/decompensation, we will need to initiate residential hospice discussions.   Code Status/Advance Care Planning:  Full code    Symptom Management:    as above   Palliative Prophylaxis:   Bowel Regimen  Psycho-social/Spiritual:   Desire for further Chaplaincy support:yes  Additional Recommendations: Education on Hospice  Prognosis:   < 6 months  Discharge Planning: To Be Determined      Primary Diagnoses: Present on Admission: . Hyperbilirubinemia . Hyponatremia   I have reviewed the medical record, interviewed the patient and family, and examined the patient. The following aspects are pertinent.  Past Medical History:  Diagnosis Date  . Abnormal echocardiography   . Acute encephalopathy   . Acute hepatitis    cirrohois  . Alcohol abuse   . Alcohol dependence (HCC) 02/28/2014  . Alcoholic cirrhosis (HCC)    Overview:  Hx of encephalopathy  . Alcoholic cirrhosis of liver without ascites (HCC)   . Alcoholic encephalopathy (HCC) 1/61/0960  . Alcoholic hepatitis    Overview:  Last Assessment & Plan:  SNF - pt started on aldactone and propranolol; cont same  . Alcoholic hepatitis without ascites 02/25/2014  . Altered mental status 06/15/2015  . Arthritis    + gout, also OA in hip  . Arthritis of right hip 09/12/2016  . Ascites 04/25/2014  . CHF (congestive heart failure) (HCC)   . Chronic pain of right hip 11/09/2015  . Elevated CK 06/15/2015  . Elevated LFTs 11/21/2014  . Elevated liver enzymes   . Elevated troponin 06/15/2015  . Esophageal reflux   . Essential hypertension 11/10/2014   Overview:  Was on lisinopril 20 mg daily per records  . Fall 06/15/2015  . GERD (gastroesophageal reflux disease)   . Hepatic cirrhosis (HCC) 06/15/2015  . History of alcohol abuse 04/25/2014  . History of hiatal hernia   . Hyperbilirubinemia 03/01/2014   Overview:  Last Assessment & Plan:  Likely due to  liver cirrhosis, ascites has macrocytic anemia, thrombocytopenia, hepatic encephalopathy/hyperammonemia - CT abdomen showed hepatic cirrhosis with degenerative nodules - Ultrasound showed severe hepatic steatosis, trace amount of perisplenic ascites - on steroids for acute alcoholic hepatitis, patient has GI appointment for further steroid taper outpatient , continue current dose for 28 days. - AFP 2.4 - Placed on propranolol, Aldactone 50 mg daily. Follow potassium level. SNF - cont propranolol and aldactone; f/u CMP for K and bilirubin  . Hypercholesterolemia   . Hypertension   . Hypokalemia   . Hypomagnesemia   . Hyponatremia   . Increased liver enzymes   . Inflammatory liver disease 11/21/2014  . Left ankle pain 04/25/2014  . Lower extremity edema 04/25/2014  . Macrocytic anemia   . Maxillary sinus fracture (HCC) 04/05/2015  . Osteoarthritis of right hip 11/10/2015  . Polyarthritis of multiple sites   . Pure hypercholesterolemia 10/19/2014   Overview:  LDL 206 per records 03/2014. Not on statin due to liver disease  .  Rash 04/25/2014  . Seizures (HCC)    pt. denies   . Shortness of breath 04/25/2014  . Thrombocytopenia (HCC)   . Tremors of nervous system   . Vitamin D deficiency    Social History   Socioeconomic History  . Marital status: Married    Spouse name: None  . Number of children: 2  . Years of education: None  . Highest education level: None  Social Needs  . Financial resource strain: None  . Food insecurity - worry: None  . Food insecurity - inability: None  . Transportation needs - medical: None  . Transportation needs - non-medical: None  Occupational History  . Occupation: Retired  Tobacco Use  . Smoking status: Never Smoker  . Smokeless tobacco: Never Used  Substance and Sexual Activity  . Alcohol use: Yes    Alcohol/week: 4.8 - 7.8 oz    Types: 7 - 12 Cans of beer, 1 Shots of liquor per week    Comment: daily  . Drug use: No  . Sexual activity: Yes  Other  Topics Concern  . None  Social History Narrative  . None   Family History  Problem Relation Age of Onset  . Heart failure Father   . Alcohol abuse Father   . Heart attack Father   . COPD Mother   . Colon cancer Neg Hx   . Colon polyps Neg Hx   . Diabetes Neg Hx   . Kidney disease Neg Hx   . Gallbladder disease Neg Hx   . Esophageal cancer Neg Hx    Scheduled Meds: . aspirin EC  325 mg Oral Daily  . fluconazole  100 mg Oral Daily  . folic acid  1 mg Oral Daily  . furosemide  40 mg Oral Daily  . heparin  5,000 Units Subcutaneous Q8H  . lactulose  20 g Oral TID  . lactulose  300 mL Rectal Once  . liver oil-zinc oxide   Topical BID  . multivitamin with minerals  1 tablet Oral Daily  . nystatin ointment   Topical BID  . pantoprazole  40 mg Oral QAC breakfast  . prednisoLONE  40 mg Oral Daily  . propranolol  10 mg Oral Daily  . rifaximin  550 mg Oral BID  . sodium chloride flush  3 mL Intravenous Q12H  . spironolactone  100 mg Oral Daily  . thiamine  100 mg Oral Daily   Or  . thiamine  100 mg Intravenous Daily   Continuous Infusions: PRN Meds:.ondansetron **OR** ondansetron (ZOFRAN) IV Medications Prior to Admission:  Prior to Admission medications   Medication Sig Start Date End Date Taking? Authorizing Provider  aspirin EC 325 MG tablet Take 1 tablet (325 mg total) by mouth 2 (two) times daily. Patient taking differently: Take 325 mg by mouth daily.  09/22/16  Yes Allena Katz, PA-C  folic acid (FOLVITE) 1 MG tablet Take 1 mg by mouth daily.   Yes [provider]  furosemide (LASIX) 40 MG tablet Take 40 mg by mouth daily.   Yes [provider]  hydroxypropyl methylcellulose / hypromellose (ISOPTO TEARS / GONIOVISC) 2.5 % ophthalmic solution Place 1 drop into both eyes 4 (four) times daily as needed for dry eyes. 06/20/15  Yes Rai, Ripudeep K, MD  Omega-3 Fatty Acids (FISH OIL) 500 MG CAPS Take 500 mg by mouth daily.   Yes [provider]    pantoprazole (PROTONIX) 40 MG tablet Take 1 tablet (40 mg total)  by mouth daily. Patient taking differently: Take 40 mg by mouth daily before breakfast.  04/26/14  Yes Vassie Loll, MD  propranolol (INDERAL) 10 MG tablet Take 1 tablet (10 mg total) by mouth 2 (two) times daily. Patient taking differently: Take 10 mg by mouth daily.  06/20/15  Yes Rai, Ripudeep K, MD  spironolactone (ALDACTONE) 50 MG tablet Take 1 tablet (50 mg total) by mouth daily. 06/20/15  Yes Rai, Ripudeep K, MD  tiZANidine (ZANAFLEX) 2 MG tablet Take 1 tablet (2 mg total) by mouth every 6 (six) hours as needed for muscle spasms. 09/22/16  Yes Allena Katz, PA-C  triamcinolone ointment (KENALOG) 0.1 % Apply 1 application topically 2 (two) times daily. 02/13/17  Yes [provider]  lactulose (CHRONULAC) 10 GM/15ML solution Take 45 mLs (30 g total) by mouth 3 (three) times daily. Patient not taking: Reported on 05/03/2017 11/13/14   Lonia Blood, MD  Multiple Vitamin (MULTIVITAMIN WITH MINERALS) TABS tablet Take 1 tablet by mouth daily. Patient not taking: Reported on 05/03/2017 03/04/14   Rodolph Bong, MD  oxyCODONE-acetaminophen (ROXICET) 5-325 MG tablet Take 1 tablet by mouth every 4 (four) hours as needed. Patient not taking: Reported on 05/03/2017 09/22/16   Allena Katz, PA-C   Allergies  Allergen Reactions  . Ace Inhibitors Other (See Comments)    Cough with lisinopril   Review of Systems +weakness  Physical Exam Icteric appearing, older than stated age appearing gentleman Mild asterixis Awake alert, oriented, able to answer questions appropriately Mild-moderate anxiety 2+ peripheral lower extremity edema Shallow. Breath sounds S1-S2  Vital Signs: BP 100/62 (BP Location: Left Arm)   Pulse (!) 117   Temp 99 F (37.2 C) (Oral)   Resp 18   Ht 5\' 10"  (1.778 m)   Wt 104.8 kg (231 lb 0.7 oz)   SpO2 95%   BMI 33.15 kg/m  Pain Assessment: No/denies pain   Pain Score: 0-No  pain   SpO2: SpO2: 95 % O2 Device:SpO2: 95 % O2 Flow Rate: .   IO: Intake/output summary:   Intake/Output Summary (Last 24 hours) at 05/09/2017 1113 Last data filed at 05/08/2017 2153 Gross per 24 hour  Intake 743 ml  Output -  Net 743 ml    LBM: Last BM Date: 05/08/17 Baseline Weight: Weight: 103.5 kg (228 lb 2.8 oz) Most recent weight: Weight: 104.8 kg (231 lb 0.7 oz)     Palliative Assessment/Data:   PPS 30-40%  Time In:  10 Time Out:  11.10 Time Total:  70 min  Greater than 50%  of this time was spent counseling and coordinating care related to the above assessment and plan.  Signed by: Rosalin Hawking, MD  [phone]  Please contact Palliative Medicine Team phone at [phone] for questions and concerns.  For individual provider: See Loretha Stapler

## 2017-05-10 LAB — COMPREHENSIVE METABOLIC PANEL
ALBUMIN: 1.7 g/dL — AB (ref 3.5–5.0)
ALK PHOS: 97 U/L (ref 38–126)
ALT: 66 U/L — AB (ref 17–63)
ANION GAP: 6 (ref 5–15)
AST: 112 U/L — AB (ref 15–41)
BILIRUBIN TOTAL: 19.2 mg/dL — AB (ref 0.3–1.2)
BUN: 33 mg/dL — ABNORMAL HIGH (ref 6–20)
CALCIUM: 7.9 mg/dL — AB (ref 8.9–10.3)
CO2: 22 mmol/L (ref 22–32)
CREATININE: 1.07 mg/dL (ref 0.61–1.24)
Chloride: 98 mmol/L — ABNORMAL LOW (ref 101–111)
GFR calc Af Amer: >60 mL/min (ref >60)
GFR calc non Af Amer: >60 mL/min (ref >60)
GLUCOSE: 119 mg/dL — AB (ref 65–99)
Potassium: 4 mmol/L (ref 3.5–5.1)
SODIUM: 126 mmol/L — AB (ref 135–145)
TOTAL PROTEIN: 5 g/dL — AB (ref 6.5–8.1)

## 2017-05-10 LAB — PROTIME-INR
INR: 2.42
Prothrombin Time: 26.1 seconds — ABNORMAL HIGH (ref 11.4–15.2)

## 2017-05-10 LAB — AMMONIA: Ammonia: 86 umol/L — ABNORMAL HIGH (ref 9–35)

## 2017-05-10 MED ORDER — MIRTAZAPINE 15 MG PO TABS
15.0000 mg | ORAL_TABLET | Freq: Every day | ORAL | Status: DC
  Administered 2017-05-10 – 2017-05-13 (×4): 15 mg via ORAL
  Filled 2017-05-10 (×4): qty 1

## 2017-05-10 MED ORDER — LACTULOSE ENEMA
300.0000 mL | Freq: Once | ORAL | Status: AC
  Administered 2017-05-10: 300 mL via RECTAL
  Filled 2017-05-10: qty 300

## 2017-05-10 MED ORDER — NAPHAZOLINE-GLYCERIN 0.012-0.2 % OP SOLN
1.0000 [drp] | Freq: Four times a day (QID) | OPHTHALMIC | Status: DC | PRN
  Administered 2017-05-10: 2 [drp] via OPHTHALMIC
  Filled 2017-05-10: qty 15

## 2017-05-10 NOTE — Progress Notes (Signed)
PROGRESS NOTE    MAVERYCK BAHRI  ZOX:[id number] DOB: 02-Aug-1950 DOA: 05/03/2017 PCP: [name], NP   Brief Narrative: [name] a 67 y.o.malepast medical history of arthritis, hypertension, reflux, alcohol abuse, lower extremity edema, heart failure presents to the emergency room with chief complaint of weakness and jaundice. Patient states that he has been feeling weaker over the last 4 weeks. He also had several weeks of red painful scrotum and red irritated gluteal cleft.  History of chronic alcohol abuse. Patient was found to have elevated bilirubin,INR and  LFT on presentation. GI following.  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 Due to his poor prognosis, palliative care is also involved in the management.   Assessment & Plan:   Principal Problem:   Hyperbilirubinemia Active Problems:   Hyponatremia   AKI (acute kidney injury) (HCC)   Jaundice   Encephalopathy, hepatic (HCC)   Junctional rhythm   Pressure injury of skin  Acute decompensated alcoholic liver cirrhosis: Maddrey Discriminant Function Score was >60; Started on  Prednisolone. C/w Furosemide 40 mg po Daily; Spironolactone also started  Underwent Paracentesis to exclude SBP; Cytology showed just inflammatory cells. WBC in Peritoneal  Fluid was 181 C/w Propanolol 10 mg po Daily  Counseled on EtOH Abuse US abdomen on 05/05/17  showed:Hepatic cirrhosis with stable left hepatic cyst measuring approximately 1.4 x 1.1 x 1.5 cm, Small volume of ascites GI planning for repeating EGD to rule out varices in near future He might need repeat tap in few days Given his poor prognosis,palliative care is also following.Patient remains as full code  Sinus tachycardia/Junctional rhythm ?: Twelve-lead EKG showed junctional rhythm .  Discussed with cardiology , Dr. Duke Salvia who evaluated the EKG and concluded no intervention needed. Patient in sinus tachycardia.HR  better controlled today  Alcoholic hepatitis: Liver enzymes improving.  GI following, started prednisolone .  Elevated ammonia level: Continues to trend up.  Will be given a  dose of lactulose edema today.Continue oral lactulose , rifaximin.  Mental status has not deteriorated and he is currently alert and oriented.  Hyponatremia: We will continue to monitor  Lactic acidosis:Resolved  Scrotal fungal skin infection: Continue nystatin and scrotal support.Added fluconazole  Hypertension: Continue propranolol  Generalized weakness: Physical therapy evaluate the patient and recommended skilled nursing facility.  Acute kidney injury: Kidney function has improved with hydration.    GERD: Continue PPI  Anemia/Thrombocytopenia/elevated INR: Secondary to end stage liver disease .  Currently stable. Continue folic acid and thiamine  Depressed mood: Started on Remeron    DVT prophylaxis:SCD Code Status: Full Family Communication: None present at the bedside Disposition Plan: Likely skilled nursing facility after improvement in the level.  Consultants: Gastroenterology  Procedures: Paracentesis  Antimicrobials:Zosyn since 05/03/17-05/07/17  Subjective: Patient seen and examined the bedside this morning.  His mental status is stable.  He is alert and oriented and coherent despite high level of ammonia.  Patient continues to be in denial about his poor prognosis.  He was concerned about lack of financial support for skilled nursing facility placement.  Objective: Vitals:   05/09/17 1356 05/09/17 1400 05/09/17 2118 05/10/17 0514  BP:  91/67 96/62 107/70  Pulse:  (!) 104 67 (!) 105  Resp:  18 20 20   Temp:  98.4 F (36.9 C) 98.7 F (37.1 C) 98.2 F (36.8 C)  TempSrc:  Oral Oral Oral  SpO2: 99% 94% 95% 94%  Weight:      Height:  Intake/Output Summary (Last 24 hours) at 05/10/2017 1234 Last data filed at 05/09/2017 1321 Gross per 24 hour  Intake -  Output 2 ml  Net -2 ml    Filed Weights   05/03/17 1532 05/04/17 0359  Weight: 103.5 kg (228 lb 2.8 oz) 104.8 kg (231 lb 0.7 oz)    Examination:  General exam: Not in distress,obese, chronically ill HEENT:PERRL,Oral mucosa moist, Ear/Nose normal on gross exam Respiratory system: Bilateral decreased air entry in the bases  cardiovascular system: S1 & S2 heard, RRR. No JVD, murmurs, rubs, gallops or clicks. Gastrointestinal system: Abdomen is distended, soft and nontender. No organomegaly or masses felt. Normal bowel sounds heard.  Umbilical hernia Central nervous system: Alert and oriented. No focal neurological deficits. Extremities: Trace edema, no clubbing ,no cyanosis, distal peripheral pulses palpable. Skin: Icterus, no rashes, lesions or ulcers,no pallor Psychiatry: Looks depressed     Data Reviewed: I have personally reviewed following labs and imaging studies  CBC: Recent Labs  Lab 05/04/17 0749 05/05/17 0440 05/06/17 0424 05/08/17 0444  WBC 6.9 7.5 8.0 9.7  NEUTROABS 4.7 4.5 4.7 6.2  HGB 11.0* 11.1* 11.1* 11.1*  HCT 30.0* 30.0* 30.1* 30.4*  MCV 104.2* 104.2* 105.6* 106.7*  PLT 91* 92* 101* 114*   Basic Metabolic Panel: Recent Labs  Lab 05/04/17 0749  05/05/17 0440 05/06/17 0424 05/07/17 0433 05/08/17 0444 05/09/17 0502 05/10/17 0625  NA 130*   < > 131* 131* 127* 127* 128* 126*  K 3.7   < > 3.5 3.4* 3.6 3.6 4.2 4.0  CL 100*   < > 102 101 99* 99* 99* 98*  CO2 19*   < > 20* 22 20* 19* 22 22  GLUCOSE 102*   < > 110* 124* 109* 112* 130* 119*  BUN 39*   < > 31* 28* 26* 28* 31* 33*  CREATININE 1.21   < > 0.94 0.86 1.01 1.15 1.08 1.07  CALCIUM 8.6*   < > 8.4* 8.2* 7.8* 7.9* 8.0* 7.9*  MG 1.5*  --  1.8 1.6* 1.9  --   --   --   PHOS 2.9  --  2.7 2.7  --   --   --   --    < > = values in this interval not displayed.   GFR: Estimated Creatinine Clearance: 81.2 mL/min (by C-G formula based on SCr of 1.07 mg/dL). Liver Function Tests: Recent Labs  Lab 05/06/17 0424 05/07/17 0433  05/08/17 0444 05/09/17 0502 05/10/17 0625  AST 148* 135* 144* 129* 112*  ALT 79* 73* 73* 68* 66*  ALKPHOS 95 96 98 102 97  BILITOT 20.7* 19.1* 19.9* 20.0* 19.2*  PROT 5.2* 5.0* 5.1* 5.0* 5.0*  ALBUMIN 1.9* 1.7* 1.8* 1.7* 1.7*   No results for input(s): LIPASE, AMYLASE in the last 168 hours. Recent Labs  Lab 05/07/17 0433 05/08/17 0444 05/09/17 0502 05/10/17 0625  AMMONIA 59* 61* 79* 86*   Coagulation Profile: Recent Labs  Lab 05/05/17 1046 05/10/17 0625  INR 2.18 2.42   Cardiac Enzymes: No results for input(s): CKTOTAL, CKMB, CKMBINDEX, TROPONINI in the last 168 hours. BNP (last 3 results) No results for input(s): PROBNP in the last 8760 hours. HbA1C: No results for input(s): HGBA1C in the last 72 hours. CBG: Recent Labs  Lab 05/06/17 1153 05/06/17 1728  GLUCAP 126* 131*   Lipid Profile: No results for input(s): CHOL, HDL, LDLCALC, TRIG, CHOLHDL, LDLDIRECT in the last 72 hours. Thyroid Function Tests: No results for input(s): TSH, T4TOTAL, FREET4,  T3FREE, THYROIDAB in the last 72 hours. Anemia Panel: No results for input(s): VITAMINB12, FOLATE, FERRITIN, TIBC, IRON, RETICCTPCT in the last 72 hours. Sepsis Labs: Recent Labs  Lab 05/03/17 1535 05/03/17 1831  LATICACIDVEN 2.2* 1.6    Recent Results (from the past 240 hour(s))  Culture, blood (routine x 2)     Status: None   Collection Time: 05/03/17  8:44 AM  Result Value Ref Range Status   Specimen Description BLOOD RIGHT ANTECUBITAL  Final   Special Requests   Final    BOTTLES DRAWN AEROBIC AND ANAEROBIC Blood Culture adequate volume Performed at [facility], [address] [place], [place] [address]    Culture   Final    NO GROWTH 5 DAYS Performed at [facility], [address], [place], [place] [address]    Report Status 05/08/2017 FINAL  Final  Culture, blood (routine x 2)     Status: None   Collection Time: 05/03/17  8:49 AM  Result Value Ref Range Status   Specimen  Description BLOOD LEFT ANTECUBITAL  Final   Special Requests   Final    BOTTLES DRAWN AEROBIC AND ANAEROBIC Blood Culture adequate volume Performed at [facility], [address] [place], [place] [address]    Culture   Final    NO GROWTH 5 DAYS Performed at [facility], [address] [place], [place] [address]    Report Status 05/08/2017 FINAL  Final  Gram stain     Status: None   Collection Time: 05/03/17  2:30 PM  Result Value Ref Range Status   Specimen Description ABDOMEN FLUID  Final   Special Requests NONE  Final   Gram Stain   Final    WBC PRESENT,BOTH PMN AND MONONUCLEAR NO ORGANISMS SEEN CRITICAL RESULT CALLED TO, READ BACK BY AND VERIFIED WITH: J.Baruch Merl, RN 05/03/17 @1938  BY V.WILKINS Performed at [facility], [address] [place], [place] [address]    Report Status 05/03/2017 FINAL  Final         Radiology Studies: No results found.      Scheduled Meds: . aspirin EC  325 mg Oral Daily  . fluconazole  100 mg Oral Daily  . folic acid  1 mg Oral Daily  . furosemide  40 mg Oral Daily  . heparin  5,000 Units Subcutaneous Q8H  . lactulose  20 g Oral TID  . lactulose  300 mL Rectal Once  . lactulose  300 mL Rectal Once  . liver oil-zinc oxide   Topical BID  . multivitamin with minerals  1 tablet Oral Daily  . nystatin ointment   Topical BID  . pantoprazole  40 mg Oral QAC breakfast  . prednisoLONE  40 mg Oral Daily  . propranolol  10 mg Oral Daily  . rifaximin  550 mg Oral BID  . sodium chloride flush  3 mL Intravenous Q12H  . spironolactone  100 mg Oral Daily  . thiamine  100 mg Oral Daily   Or  . thiamine  100 mg Intravenous Daily   Continuous Infusions:    LOS: 7 days    Time spent: 25 mins.More than 50% of that time was spent in counseling and/or coordination of care.      Meredith Leeds, MD [facility] Pager [phone]  If 7PM-7AM, please contact  night-coverage [email or web address] Password [facility] 05/10/2017, 12:34 PM

## 2017-05-10 NOTE — Plan of Care (Signed)
Patient stable during 7 a to 7 p shift, had several large BMs after receiving Lactulose enema.  Patient tearful throughout shift.

## 2017-05-11 LAB — COMPREHENSIVE METABOLIC PANEL
ALBUMIN: 1.8 g/dL — AB (ref 3.5–5.0)
ALT: 72 U/L — ABNORMAL HIGH (ref 17–63)
ANION GAP: 8 (ref 5–15)
AST: 121 U/L — ABNORMAL HIGH (ref 15–41)
Alkaline Phosphatase: 100 U/L (ref 38–126)
BUN: 30 mg/dL — ABNORMAL HIGH (ref 6–20)
CO2: 22 mmol/L (ref 22–32)
Calcium: 8.1 mg/dL — ABNORMAL LOW (ref 8.9–10.3)
Chloride: 99 mmol/L — ABNORMAL LOW (ref 101–111)
Creatinine, Ser: 0.84 mg/dL (ref 0.61–1.24)
GFR calc Af Amer: >60 mL/min (ref >60)
GFR calc non Af Amer: >60 mL/min (ref >60)
GLUCOSE: 146 mg/dL — AB (ref 65–99)
Potassium: 4 mmol/L (ref 3.5–5.1)
SODIUM: 129 mmol/L — AB (ref 135–145)
TOTAL PROTEIN: 5.4 g/dL — AB (ref 6.5–8.1)
Total Bilirubin: 18.5 mg/dL (ref 0.3–1.2)

## 2017-05-11 LAB — AMMONIA: Ammonia: 72 umol/L — ABNORMAL HIGH (ref 9–35)

## 2017-05-11 MED ORDER — LACTULOSE 10 GM/15ML PO SOLN
30.0000 g | Freq: Three times a day (TID) | ORAL | Status: DC
  Administered 2017-05-11 – 2017-05-14 (×10): 30 g via ORAL
  Filled 2017-05-11 (×11): qty 60

## 2017-05-11 NOTE — Care Management Important Message (Signed)
Important Message  Patient Details  Name: CONRADO KROTZER MRN: [id number] Date of Birth: 25-Dec-1950   Medicare Important Message Given:  Yes    Caren Macadam 05/11/2017, 1:20 PMImportant Message  Patient Details  Name: AAHIL GILE MRN: [id number] Date of Birth: June 04, 1950   Medicare Important Message Given:  Yes    Caren Macadam 05/11/2017, 1:20 PM

## 2017-05-11 NOTE — Progress Notes (Signed)
Pt resting without distress, assessment unchanged, pt has red bottom due to increase stools resulting from lactolose. Bottom red and excoriated, cream applied. SRP, RN

## 2017-05-11 NOTE — Progress Notes (Signed)
Physical Therapy Treatment Patient Details Name: Gary [name] MRN: [id number] DOB: [birth year] Today's Date: 05/11/2017    History of Present Illness 67 y.o. male past medical history of arthritis, hypertension, reflux, alcohol abuse, lower extremity edema, heart failure presents to the emergency room with chief complaint of weakness and jaundice and admitted for Hyperbilirubinemia    PT Comments    Pt required even more assist and was only able to amb to bathroom and back.  Very unsteady gait.  B LE present with edema. Poor self correction to midline and delayed correction reaction.  HIGH FALL RISK.  Pt will need ST Rehab at SNF prior to returning home.    Follow Up Recommendations  SNF     Equipment Recommendations  None recommended by PT    Recommendations for Other Services       Precautions / Restrictions Precautions Precautions: Fall Restrictions Weight Bearing Restrictions: No    Mobility  Bed Mobility Overal bed mobility: Needs Assistance Bed Mobility: Supine to Sit;Sit to Supine     Supine to sit: Mod assist Sit to supine: Mod assist   General bed mobility comments: Assist for trunk and bil LEs. Increased time. Utilized bedpad for scooting, positioning. Very slow moving, nearly 3 min to complete supine to sit  Transfers Overall transfer level: Needs assistance Equipment used: Rolling walker (2 wheeled) Transfers: Sit to/from Stand Sit to Stand: Min assist;Mod assist         General transfer comment: Assist to rise, stabilize. Increased time.   very unsteady  Ambulation/Gait Ambulation/Gait assistance: Min assist Ambulation Distance (Feet): 16 Feet(8 feet x 2 to and from bathroom) Assistive device: Rolling walker (2 wheeled) Gait Pattern/deviations: Step-through pattern;Staggering left;Staggering right Gait velocity: decreased   General Gait Details: unsteady gait with heavy lean on walker and instability esp with turns.  HIGH FALL RISK   Stairs            Wheelchair Mobility    Modified Rankin (Stroke Patients Only)       Balance                                            Cognition Arousal/Alertness: Awake/alert Behavior During Therapy: Flat affect                                   General Comments: slow      Exercises      General Comments        Pertinent Vitals/Pain Pain Assessment: No/denies pain    Home Living                      Prior Function            PT Goals (current goals can now be found in the care plan section)      Frequency    Min 3X/week      PT Plan Current plan remains appropriate    Co-evaluation              AM-PAC PT "6 Clicks" Daily Activity  Outcome Measure  Difficulty turning over in bed (including adjusting bedclothes, sheets and blankets)?: A Lot Difficulty moving from lying on back to sitting on the side of the bed? : Unable Difficulty sitting down on and standing up  from a chair with arms (e.g., wheelchair, bedside commode, etc,.)?: Unable Help needed moving to and from a bed to chair (including a wheelchair)?: A Lot Help needed walking in hospital room?: A Lot Help needed climbing 3-5 steps with a railing? : Total 6 Click Score: 9    End of Session Equipment Utilized During Treatment: Gait belt Activity Tolerance: Patient limited by fatigue Patient left: in bed;with call bell/phone within reach;with bed alarm set Nurse Communication: Mobility status PT Visit Diagnosis: Unsteadiness on feet (R26.81);Difficulty in walking, not elsewhere classified (R26.2)     Time: 7829-5621 PT Time Calculation (min) (ACUTE ONLY): 25 min  Charges:  $Gait Training: 8-22 mins $Therapeutic Activity: 8-22 mins                    G Codes:       Felecia Shelling  PTA [facility]  Acute  Rehab Pager      [phone] '

## 2017-05-11 NOTE — Progress Notes (Signed)
Daily Progress Note   Patient Name: Gary [name]       Date: 05/11/2017 DOB: [birth year]  Age: 67 y.o. MRN#: [id number] Attending Physician: Meredith Leeds, MD Primary Care Physician: [name], NP Admit Date: 05/03/2017  Reason for Consultation/Follow-up: Establishing goals of care  Subjective:  patient is awake alert, resting in bed. He is eating some what better, denies pain, does appear despondent and tearful at times. There is no family at bedside. Patient doesn't want to go to SNF rehab if there is a hefty co payment involved, states his funds are limited.  See below:   Length of Stay: 8  Current Medications: Scheduled Meds:  . aspirin EC  325 mg Oral Daily  . fluconazole  100 mg Oral Daily  . folic acid  1 mg Oral Daily  . heparin  5,000 Units Subcutaneous Q8H  . lactulose  30 g Oral TID  . lactulose  300 mL Rectal Once  . liver oil-zinc oxide   Topical BID  . mirtazapine  15 mg Oral QHS  . multivitamin with minerals  1 tablet Oral Daily  . nystatin ointment   Topical BID  . pantoprazole  40 mg Oral QAC breakfast  . prednisoLONE  40 mg Oral Daily  . propranolol  10 mg Oral Daily  . rifaximin  550 mg Oral BID  . sodium chloride flush  3 mL Intravenous Q12H  . thiamine  100 mg Oral Daily   Or  . thiamine  100 mg Intravenous Daily    Continuous Infusions:   PRN Meds: naphazoline-glycerin, ondansetron **OR** ondansetron (ZOFRAN) IV  Physical Exam         Awake alert Clear Abdomen distended S1 S2 Trace edema Still appears some what icteric Flat affect.   Vital Signs: BP 107/76 (BP Location: Left Arm)   Pulse (!) 107   Temp 98.2 F (36.8 C) (Oral)   Resp 17   Ht 5\' 10"  (1.778 m)   Wt 104.8 kg (231 lb 0.7 oz)   SpO2 94%   BMI 33.15 kg/m  SpO2: SpO2:  94 % O2 Device: O2 Device: Room Air O2 Flow Rate:    Intake/output summary:   Intake/Output Summary (Last 24 hours) at 05/11/2017 1313 Last data filed at 05/11/2017 1000 Gross per 24 hour  Intake  480 ml  Output -  Net 480 ml   LBM: Last BM Date: 05/04/17 Baseline Weight: Weight: 103.5 kg (228 lb 2.8 oz) Most recent weight: Weight: 104.8 kg (231 lb 0.7 oz)       Palliative Assessment/Data:    Flowsheet Rows     Most Recent Value  Intake Tab  Referral Department  Hospitalist  Unit at Time of Referral  Med/Surg Unit  Palliative Care Primary Diagnosis  Other (Comment) [Alcoholic cirrhosis ]  Date Notified  05/08/17  Palliative Care Type  New Palliative care  Reason for referral  Clarify Goals of Care, Counsel Regarding Hospice  Date of Admission  05/03/17  Date first seen by Palliative Care  05/09/17  # of days Palliative referral response time  1 Day(s)  # of days IP prior to Palliative referral  5  Clinical Assessment  Psychosocial & Spiritual Assessment  Palliative Care Outcomes      Patient Active Problem List   Diagnosis Date Noted  . Pressure injury of skin 05/07/2017  . Junctional rhythm 05/06/2017  . Jaundice   . Encephalopathy, hepatic (HCC)   . AKI (acute kidney injury) (HCC) 05/03/2017  . Primary localized osteoarthritis of right hip 09/22/2016  . Abnormal EKG 09/17/2016  . Left anterior fascicular block (LAFB) 09/17/2016  . Cardiomyopathy (HCC) 09/17/2016  . Preoperative cardiovascular examination 09/17/2016  . Ectopic atrial rhythm 09/17/2016  . Arthritis of right hip 09/12/2016    Class: Chronic  . Osteoarthritis of right hip 11/10/2015  . Chronic pain of right hip 11/09/2015  . Alcoholic encephalopathy (HCC) 16/12/9602  . Vitamin D deficiency 06/28/2015  . Abnormal echocardiography   . Alcoholic hepatitis   . Altered mental status 06/15/2015  . Hepatic cirrhosis (HCC) 06/15/2015  . Elevated troponin 06/15/2015  . Elevated CK 06/15/2015  .  Fall 06/15/2015  . Maxillary sinus fracture (HCC) 04/05/2015  . Elevated LFTs 11/21/2014  . Inflammatory liver disease 11/21/2014  . Alcohol abuse 11/21/2014  . Hypokalemia   . Hypomagnesemia   . Acute encephalopathy   . Increased liver enzymes   . Hypertensive heart disease 11/10/2014  . Pure hypercholesterolemia 10/19/2014  . Polyarthritis of multiple sites   . Esophageal reflux   . Alcoholic cirrhosis (HCC)   . Shortness of breath 04/25/2014  . History of alcohol abuse 04/25/2014  . Lower extremity edema 04/25/2014  . Ascites 04/25/2014  . Rash 04/25/2014  . Left ankle pain 04/25/2014  . Hyponatremia   . Hyperbilirubinemia 03/01/2014  . Macrocytic anemia 03/01/2014  . Thrombocytopenia (HCC) 03/01/2014  . Alcohol dependence (HCC) 02/28/2014  . Alcoholic hepatitis without ascites 02/25/2014  . Acute hepatitis 02/25/2014    Palliative Care Assessment & Plan   Patient Profile:    Assessment:  acute ETOH cirrhosis ETOH hepatitis AKI: improved Generalized weakness/deconditiong.  Probably has adjustment disorder with depressed mood related to his overall health.  Low platelets due to liver disease, has high MELD score.   Recommendations/Plan:  Code status and goals of care discussions again undertaken, patient wishes for full code. He states he will take all his medications and follow up with his doctors in the out patient setting, he asks,"If I do what they say, do I have a chance?". I have offered him active listening and supportive care, encouraged him to continue conversations with his wife and sister.   Sister Marcelino Duster is his chosen Diplomatic Services operational officer.   Likely will be a home with home health discharge unless a SNF rehab  can be arranged.    Code Status:    Code Status Orders  (From admission, onward)        Start     Ordered   05/03/17 1424  Full code  Continuous     05/03/17 1424    Code Status History    Date Active Date Inactive Code  Status Order ID Comments User Context   09/22/2016 12:38 09/23/2016 19:26 Full Code [id number]  Janetta Hora Inpatient   06/15/2015 05:15 06/20/2015 16:18 Full Code [id number]  Alberteen Sam, MD Inpatient   04/06/2015 00:52 04/08/2015 17:36 Full Code [id number]  Violeta Gelinas, MD Inpatient   11/10/2014 20:55 11/13/2014 17:12 Full Code [id number]  Maretta Bees, MD Inpatient   04/25/2014 12:45 04/26/2014 17:31 Full Code [id number]  Clydia Llano, MD ED   02/25/2014 20:30 03/04/2014 20:12 Full Code [id number]  Dorothea Ogle, MD Inpatient       Prognosis:   guarded.   Discharge Planning:  To Be Determined  Care plan was discussed with  Patient.   Thank you for allowing the Palliative Medicine Team to assist in the care of this patient.   Time In: 12 Time Out: 12.25 Total Time 25 Prolonged Time Billed  no       Greater than 50%  of this time was spent counseling and coordinating care related to the above assessment and plan.  Rosalin Hawking, MD [phone]  Please contact Palliative Medicine Team phone at [phone] for questions and concerns.

## 2017-05-11 NOTE — Progress Notes (Signed)
CRITICAL VALUE ALERT  Critical Value:  Total bilirubin 18.5  Date & Time Notied:  05/11/17  0625  Provider Notified: NP [place]   Orders Received/Actions taken: Awaiting any new orders, will continue to monitor.

## 2017-05-11 NOTE — Progress Notes (Signed)
CSW re-consulted for SNF for rehab. Patient recently declined SNF due to only offer being out of network and requiring a copayment for SNF.   CSW contacted pending SNFs on the hub that accept [employer] to see if they were able to offer patient a bed.  [facility] and [facility] made bed offers.  CSW provided update to patient and explained to patient that if SNF receives insurance authorization there would not be a copay since they are in network with patient's insurance, patient verbalized understanding. Patient reported that he is agreeable to go to [facility] for ST rehab.  CSW updated [facility], staff member Wyatt Mage agreed to start insurance authorization.  CSW will continue to follow and assist with discharge planning.  Barriers to SNF discharge: Insurance Authorization   Celso Sickle, [place] Clinical Social Worker [facility] Cell#: [phone]

## 2017-05-11 NOTE — Progress Notes (Addendum)
Patient ID: Gary [name], male   DOB: [birth year], 67 y.o.   MRN: [id number]     Progress Note   Subjective   Pt has no specific complaints- denies abdominal pain, nausea. He is oriented . He is worried about going to rehab as he says his resources are depleted  Able to ambulate to BR with walker    Objective   Vital signs in last 24 hours: Temp:  [98.2 F (36.8 C)-98.7 F (37.1 C)] 98.2 F (36.8 C) (03/11 0546) Pulse Rate:  [107-113] 107 (03/11 0546) Resp:  [17-18] 17 (03/11 0546) BP: (92-107)/(58-78) 107/76 (03/11 0546) SpO2:  [94 %-96 %] 94 % (03/11 0546) Last BM Date: 05/04/17 General:   Older  white male  in NAD. Deeply jaundiced Heart:  Regular rate and rhythm; no murmurs Lungs: Respirations even and unlabored, lungs CTA bilaterally Abdomen:  nontense ascites, nontender  Normal bowel sounds. Extremities:  Trace edema Neurologic:  Alert and oriented,  grossly normal neurologically.+ asterixis Psych:  Cooperative. Affect very flat   Intake/Output from previous day: 03/10 0701 - 03/11 0700 In: 240 [P.O.:240] Out: -  Intake/Output this shift: Total I/O In: 120 [P.O.:120] Out: -   Lab Results: No results for input(s): WBC, HGB, HCT, PLT in the last 72 hours. BMET Recent Labs    05/09/17 0502 05/10/17 0625 05/11/17 0456  NA 128* 126* 129*  K 4.2 4.0 4.0  CL 99* 98* 99*  CO2 22 22 22   GLUCOSE 130* 119* 146*  BUN 31* 33* 30*  CREATININE 1.08 1.07 0.84  CALCIUM 8.0* 7.9* 8.1*   LFT Recent Labs    05/11/17 0456  PROT 5.4*  ALBUMIN 1.8*  AST 121*  ALT 72*  ALKPHOS 100  BILITOT 18.5*   PT/INR Recent Labs    05/10/17 0625  LABPROT 26.1*  INR 2.42    Studies/Results: No results found.     Assessment / Plan:    #1 67 yo WM with decompensated alcoholic liver disease with severe ETOH hepatitis -Day #8 hospital stay  Parameters stable - Bili slowly trending down, transaminases improved , remains significantly coagulopathic On prednisolone   #2  hepatic encephalopathy- oriented , + asterixis - continue Xifaxan , will increase lactulose a bit #3 varices screening - eventual EGD- on Nadolol #4 suspect intravascular volume depletion, sodium 129, BUN 30-will hold diuretics 48 hours, if parameters improve then resume Lasix and Aldactone at half the previous dose #4 Deconditioning - needs continued PT , and agree needs  Inpatient rehab stay  - social  Work will need to discuss coverage with pt - he will refuse if not covered. Apparently social work has confirmed this morning that his insurance will not cover rehabilitation stay. He is too ill at present to be discharged to home unless he has 24 hour care.  Contact  Amy Esterwood, P.A.-C               [phone]   Principal Problem:   Hyperbilirubinemia Active Problems:   Hyponatremia   AKI (acute kidney injury) (HCC)   Jaundice   Encephalopathy, hepatic (HCC)   Junctional rhythm   Pressure injury of skin   LOS: 8 days   Amy Esterwood  05/11/2017, 9:08 AM   GI ATTENDING  Interval history of data reviewed. Patient personally seen and examined. Wife at bedside. Agree with interval progress note. Patient remains ill with multiple perturbation secondary to severe acute alcoholic hepatitis. Tolerating steroids. Has developed hyponatremia and increased BUN.  Though he has ascites, I would recommend holding diuretics for now as he is at risk for hepatorenal syndrome. When sodium BUN improved, would resume diuretics at half current dosage for his ascites.  Wilhemina Bonito. Eda Keys., M.D. [facility] Division of Gastroenterology

## 2017-05-11 NOTE — Progress Notes (Signed)
PROGRESS NOTE    Gary [name]  ZOX:[id number] DOB: Aug 09, 1950 DOA: 05/03/2017 PCP: [name], NP   Brief Narrative: Gary [name] a 67 y.o.malepast medical history of arthritis, hypertension, reflux, alcohol abuse, lower extremity edema, heart failure presents to the emergency room with chief complaint of weakness and jaundice. Patient states that he has been feeling weaker over the last 4 weeks. He also had several weeks of red painful scrotum and red irritated gluteal cleft.  History of chronic alcohol abuse. Patient was found to have elevated bilirubin,INR and  LFT on presentation. GI following.  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 Due to his poor prognosis, palliative care is also involved in the management.   Assessment & Plan:   Principal Problem:   Hyperbilirubinemia Active Problems:   Hyponatremia   AKI (acute kidney injury) (HCC)   Jaundice   Encephalopathy, hepatic (HCC)   Junctional rhythm   Pressure injury of skin  Acute decompensated alcoholic liver cirrhosis: Maddrey Discriminant Function Score was >60; Started on  Prednisolone. C/w Furosemide 40 mg po Daily; Spironolactone also started  Underwent Paracentesis to exclude SBP; Cytology showed just inflammatory cells. WBC in Peritoneal  Fluid was 181 C/w Propanolol 10 mg po Daily  Counseled on EtOH Abuse US abdomen on 05/05/17  showed:Hepatic cirrhosis with stable left hepatic cyst measuring approximately 1.4 x 1.1 x 1.5 cm, Small volume of ascites GI planning for repeating EGD to rule out varices in near future Given his poor prognosis,palliative care is also following.Patient remains as full code  Sinus tachycardia/Junctional rhythm ?: Twelve-lead EKG showed junctional rhythm .  Discussed with cardiology , Dr. Duke Salvia saw the EKG and concluded no intervention needed. Patient in sinus tachycardia.HR better controlled .  Alcoholic hepatitis: Liver  enzymes improving.  GI following, started prednisolone .  Elevated ammonia level: Slightly better today. Continue oral lactulose , rifaximin.  Mental status has not deteriorated and he is currently alert and oriented.  Hyponatremia: We will continue to monitor  Lactic acidosis:Resolved  Scrotal fungal skin infection: Continue nystatin and scrotal support.Added fluconazole  Hypertension: BP stable.Also on propanolol  Generalized weakness: Physical therapy evaluate the patient and recommended skilled nursing facility.  But patient says he cannot afford it.  Likely he will be discharged to home with home health when he is cleared by GI.  Acute kidney injury: Kidney function has improved with hydration.    GERD: Continue PPI  Anemia/Thrombocytopenia/elevated INR: Secondary to end stage liver disease .  Currently stable. Continue folic acid and thiamine  Depressed mood: Started on Remeron    DVT prophylaxis:SCD Code Status: Full Family Communication: None present at the bedside Disposition Plan: Likely Home with home health after clearance by GI  Consultants: Gastroenterology  Procedures: Paracentesis  Antimicrobials:Zosyn since 05/03/17-05/07/17  Subjective: Patient seen and examined at bedside this morning.  Looks comfortable.  Denies any complaints.  Mental status is stable.  Objective: Vitals:   05/10/17 0514 05/10/17 1244 05/10/17 2148 05/11/17 0546  BP: 107/70 (!) 101/58 92/78 107/76  Pulse: (!) 105 (!) 113 (!) 108 (!) 107  Resp: 20  18 17   Temp: 98.2 F (36.8 C)  98.7 F (37.1 C) 98.2 F (36.8 C)  TempSrc: Oral  Oral Oral  SpO2: 94% 95% 96% 94%  Weight:      Height:        Intake/Output Summary (Last 24 hours) at 05/11/2017 1143 Last data filed at 05/11/2017 1000 Gross per  24 hour  Intake 480 ml  Output -  Net 480 ml   Filed Weights   05/03/17 1532 05/04/17 0359  Weight: 103.5 kg (228 lb 2.8 oz) 104.8 kg (231 lb 0.7 oz)    Examination:  General exam:  Appears calm and comfortable ,Not in distress,obese HEENT:PERRL,Oral mucosa moist, Ear/Nose normal on gross exam Respiratory system: Bilateral equal air entry, normal vesicular breath sounds, no wheezes or crackles  Cardiovascular system: S1 & S2 heard, RRR. No JVD, murmurs, rubs, gallops or clicks. Gastrointestinal system: Abdomen is distended, soft and nontender. No organomegaly or masses felt. Normal bowel sounds heard.  Umbilical hernia Central nervous system: Alert and oriented. No focal neurological deficits. Extremities: Trace edema, no clubbing ,no cyanosis, distal peripheral pulses palpable. Skin: Icteric ,No rashes, lesions or ulcers,no pallor  Data Reviewed: I have personally reviewed following labs and imaging studies  CBC: Recent Labs  Lab 05/05/17 0440 05/06/17 0424 05/08/17 0444  WBC 7.5 8.0 9.7  NEUTROABS 4.5 4.7 6.2  HGB 11.1* 11.1* 11.1*  HCT 30.0* 30.1* 30.4*  MCV 104.2* 105.6* 106.7*  PLT 92* 101* 114*   Basic Metabolic Panel: Recent Labs  Lab 05/05/17 0440 05/06/17 0424 05/07/17 0433 05/08/17 0444 05/09/17 0502 05/10/17 0625 05/11/17 0456  NA 131* 131* 127* 127* 128* 126* 129*  K 3.5 3.4* 3.6 3.6 4.2 4.0 4.0  CL 102 101 99* 99* 99* 98* 99*  CO2 20* 22 20* 19* 22 22 22   GLUCOSE 110* 124* 109* 112* 130* 119* 146*  BUN 31* 28* 26* 28* 31* 33* 30*  CREATININE 0.94 0.86 1.01 1.15 1.08 1.07 0.84  CALCIUM 8.4* 8.2* 7.8* 7.9* 8.0* 7.9* 8.1*  MG 1.8 1.6* 1.9  --   --   --   --   PHOS 2.7 2.7  --   --   --   --   --    GFR: Estimated Creatinine Clearance: 103.4 mL/min (by C-G formula based on SCr of 0.84 mg/dL). Liver Function Tests: Recent Labs  Lab 05/07/17 0433 05/08/17 0444 05/09/17 0502 05/10/17 0625 05/11/17 0456  AST 135* 144* 129* 112* 121*  ALT 73* 73* 68* 66* 72*  ALKPHOS 96 98 102 97 100  BILITOT 19.1* 19.9* 20.0* 19.2* 18.5*  PROT 5.0* 5.1* 5.0* 5.0* 5.4*  ALBUMIN 1.7* 1.8* 1.7* 1.7* 1.8*   No results for input(s): LIPASE, AMYLASE  in the last 168 hours. Recent Labs  Lab 05/07/17 0433 05/08/17 0444 05/09/17 0502 05/10/17 0625 05/11/17 0456  AMMONIA 59* 61* 79* 86* 72*   Coagulation Profile: Recent Labs  Lab 05/05/17 1046 05/10/17 0625  INR 2.18 2.42   Cardiac Enzymes: No results for input(s): CKTOTAL, CKMB, CKMBINDEX, TROPONINI in the last 168 hours. BNP (last 3 results) No results for input(s): PROBNP in the last 8760 hours. HbA1C: No results for input(s): HGBA1C in the last 72 hours. CBG: Recent Labs  Lab 05/06/17 1153 05/06/17 1728  GLUCAP 126* 131*   Lipid Profile: No results for input(s): CHOL, HDL, LDLCALC, TRIG, CHOLHDL, LDLDIRECT in the last 72 hours. Thyroid Function Tests: No results for input(s): TSH, T4TOTAL, FREET4, T3FREE, THYROIDAB in the last 72 hours. Anemia Panel: No results for input(s): VITAMINB12, FOLATE, FERRITIN, TIBC, IRON, RETICCTPCT in the last 72 hours. Sepsis Labs: No results for input(s): PROCALCITON, LATICACIDVEN in the last 168 hours.  Recent Results (from the past 240 hour(s))  Culture, blood (routine x 2)     Status: None   Collection Time: 05/03/17  8:44 AM  Result Value Ref Range Status   Specimen Description BLOOD RIGHT ANTECUBITAL  Final   Special Requests   Final    BOTTLES DRAWN AEROBIC AND ANAEROBIC Blood Culture adequate volume Performed at [facility], [address] [place], [place] [address]    Culture   Final    NO GROWTH 5 DAYS Performed at [facility], [address], [place], [place] [address]    Report Status 05/08/2017 FINAL  Final  Culture, blood (routine x 2)     Status: None   Collection Time: 05/03/17  8:49 AM  Result Value Ref Range Status   Specimen Description BLOOD LEFT ANTECUBITAL  Final   Special Requests   Final    BOTTLES DRAWN AEROBIC AND ANAEROBIC Blood Culture adequate volume Performed at [facility], [address], [place], [place] [address]    Culture   Final    NO  GROWTH 5 DAYS Performed at [facility], [address], [place], [place] [address]    Report Status 05/08/2017 FINAL  Final  Gram stain     Status: None   Collection Time: 05/03/17  2:30 PM  Result Value Ref Range Status   Specimen Description ABDOMEN FLUID  Final   Special Requests NONE  Final   Gram Stain   Final    WBC PRESENT,BOTH PMN AND MONONUCLEAR NO ORGANISMS SEEN CRITICAL RESULT CALLED TO, READ BACK BY AND VERIFIED WITH: J.Baruch Merl, RN 05/03/17 @1938  BY V.WILKINS Performed at [facility], [address] [place], [place] [address]    Report Status 05/03/2017 FINAL  Final         Radiology Studies: No results found.      Scheduled Meds: . aspirin EC  325 mg Oral Daily  . fluconazole  100 mg Oral Daily  . folic acid  1 mg Oral Daily  . furosemide  40 mg Oral Daily  . heparin  5,000 Units Subcutaneous Q8H  . lactulose  30 g Oral TID  . lactulose  300 mL Rectal Once  . liver oil-zinc oxide   Topical BID  . mirtazapine  15 mg Oral QHS  . multivitamin with minerals  1 tablet Oral Daily  . nystatin ointment   Topical BID  . pantoprazole  40 mg Oral QAC breakfast  . prednisoLONE  40 mg Oral Daily  . propranolol  10 mg Oral Daily  . rifaximin  550 mg Oral BID  . sodium chloride flush  3 mL Intravenous Q12H  . spironolactone  100 mg Oral Daily  . thiamine  100 mg Oral Daily   Or  . thiamine  100 mg Intravenous Daily   Continuous Infusions:    LOS: 8 days    Time spent: 25 mins.More than 50% of that time was spent in counseling and/or coordination of care.      Meredith Leeds, MD [facility] Pager [phone]  If 7PM-7AM, please contact night-coverage [email or web address] Password [facility] 05/11/2017, 11:43 AM

## 2017-05-11 NOTE — Progress Notes (Signed)
Handoff to night RN, pt stable wife at bedside. SRP, RN

## 2017-05-12 ENCOUNTER — Encounter (HOSPITAL_COMMUNITY): Payer: Self-pay | Admitting: Cardiology

## 2017-05-12 DIAGNOSIS — Z515 Encounter for palliative care: Secondary | ICD-10-CM

## 2017-05-12 DIAGNOSIS — R17 Unspecified jaundice: Secondary | ICD-10-CM

## 2017-05-12 DIAGNOSIS — I428 Other cardiomyopathies: Secondary | ICD-10-CM

## 2017-05-12 DIAGNOSIS — R000 Tachycardia, unspecified: Secondary | ICD-10-CM

## 2017-05-12 LAB — COMPREHENSIVE METABOLIC PANEL
ALT: 77 U/L — AB (ref 17–63)
ANION GAP: 6 (ref 5–15)
AST: 118 U/L — ABNORMAL HIGH (ref 15–41)
Albumin: 1.8 g/dL — ABNORMAL LOW (ref 3.5–5.0)
Alkaline Phosphatase: 97 U/L (ref 38–126)
BUN: 27 mg/dL — ABNORMAL HIGH (ref 6–20)
CALCIUM: 8.2 mg/dL — AB (ref 8.9–10.3)
CHLORIDE: 101 mmol/L (ref 101–111)
CO2: 22 mmol/L (ref 22–32)
CREATININE: 0.88 mg/dL (ref 0.61–1.24)
Glucose, Bld: 101 mg/dL — ABNORMAL HIGH (ref 65–99)
Potassium: 3.6 mmol/L (ref 3.5–5.1)
Sodium: 129 mmol/L — ABNORMAL LOW (ref 135–145)
Total Bilirubin: 18.3 mg/dL (ref 0.3–1.2)
Total Protein: 5.3 g/dL — ABNORMAL LOW (ref 6.5–8.1)

## 2017-05-12 LAB — AMMONIA: AMMONIA: 48 umol/L — AB (ref 9–35)

## 2017-05-12 MED ORDER — METOPROLOL TARTRATE 25 MG PO TABS
12.5000 mg | ORAL_TABLET | Freq: Three times a day (TID) | ORAL | Status: DC
  Administered 2017-05-12 – 2017-05-14 (×7): 12.5 mg via ORAL
  Filled 2017-05-12 (×7): qty 1

## 2017-05-12 NOTE — Plan of Care (Signed)
  Progressing Health Behavior/Discharge Planning: Ability to manage health-related needs will improve 05/12/2017 1810 - Progressing by Willa Brocks, Alben Spittle, RN Clinical Measurements: Ability to maintain clinical measurements within normal limits will improve 05/12/2017 1810 - Progressing by Tyrrell Stephens, Alben Spittle, RN Will remain free from infection 05/12/2017 1810 - Progressing by Teegan Brandis, Alben Spittle, RN Diagnostic test results will improve 05/12/2017 1810 - Progressing by Evanee Lubrano, Alben Spittle, RN Respiratory complications will improve 05/12/2017 1810 - Progressing by Angline Schweigert, Alben Spittle, RN Cardiovascular complication will be avoided 05/12/2017 1810 - Progressing by Nadra Hritz, Alben Spittle, RN Pain Managment: General experience of comfort will improve 05/12/2017 1810 - Progressing by Klinton Candelas, Alben Spittle, RN Skin Integrity: Risk for impaired skin integrity will decrease 05/12/2017 1810 - Progressing by Annora Guderian, Alben Spittle, RN

## 2017-05-12 NOTE — Progress Notes (Signed)
Daily Progress Note   Patient Name: Gary [name]       Date: 05/12/2017 DOB: 1950-11-28  Age: 67 y.o. MRN#: [id number] Attending Physician: Meredith Leeds, MD Primary Care Physician: [name], NP Admit Date: 05/03/2017  Reason for Consultation/Follow-up: Establishing goals of care  Subjective: Mr. Symmes is awake and alert and lying in bed.  He reports that they are working to find a place for discharge to skilled facility for trial of rehab.  See below:   Length of Stay: 9  Current Medications: Scheduled Meds:  . aspirin EC  325 mg Oral Daily  . fluconazole  100 mg Oral Daily  . folic acid  1 mg Oral Daily  . heparin  5,000 Units Subcutaneous Q8H  . lactulose  30 g Oral TID  . lactulose  300 mL Rectal Once  . liver oil-zinc oxide   Topical BID  . metoprolol tartrate  12.5 mg Oral TID  . mirtazapine  15 mg Oral QHS  . multivitamin with minerals  1 tablet Oral Daily  . nystatin ointment   Topical BID  . pantoprazole  40 mg Oral QAC breakfast  . prednisoLONE  40 mg Oral Daily  . rifaximin  550 mg Oral BID  . sodium chloride flush  3 mL Intravenous Q12H  . thiamine  100 mg Oral Daily   Or  . thiamine  100 mg Intravenous Daily    Continuous Infusions:   PRN Meds: naphazoline-glycerin, ondansetron **OR** ondansetron (ZOFRAN) IV  Physical Exam         Awake alert Clear Abdomen distended S1 S2 Trace edema Still appears some what icteric Flat affect.   Vital Signs: BP 96/73 (BP Location: Left Arm)   Pulse (!) 117   Temp 97.9 F (36.6 C) (Oral)   Resp 20   Ht 5\' 10"  (1.778 m)   Wt 104.8 kg (231 lb 0.7 oz)   SpO2 95%   BMI 33.15 kg/m  SpO2: SpO2: 95 % O2 Device: O2 Device: Room Air O2 Flow Rate:    Intake/output summary:   Intake/Output Summary (Last  24 hours) at 05/12/2017 1818 Last data filed at 05/12/2017 1300 Gross per 24 hour  Intake 840 ml  Output -  Net 840 ml   LBM: Last BM Date: 05/11/17 Baseline Weight: Weight:  103.5 kg (228 lb 2.8 oz) Most recent weight: Weight: 104.8 kg (231 lb 0.7 oz)       Palliative Assessment/Data:    Flowsheet Rows     Most Recent Value  Intake Tab  Referral Department  Hospitalist  Unit at Time of Referral  Med/Surg Unit  Palliative Care Primary Diagnosis  Other (Comment) [Alcoholic cirrhosis ]  Date Notified  05/08/17  Palliative Care Type  New Palliative care  Reason for referral  Clarify Goals of Care, Counsel Regarding Hospice  Date of Admission  05/03/17  Date first seen by Palliative Care  05/09/17  # of days Palliative referral response time  1 Day(s)  # of days IP prior to Palliative referral  5  Clinical Assessment  Psychosocial & Spiritual Assessment  Palliative Care Outcomes      Patient Active Problem List   Diagnosis Date Noted  . Pressure injury of skin 05/07/2017  . Junctional rhythm 05/06/2017  . Jaundice   . Encephalopathy, hepatic (HCC)   . AKI (acute kidney injury) (HCC) 05/03/2017  . Primary localized osteoarthritis of right hip 09/22/2016  . Abnormal EKG 09/17/2016  . Left anterior fascicular block (LAFB) 09/17/2016  . Cardiomyopathy (HCC) 09/17/2016  . Preoperative cardiovascular examination 09/17/2016  . Ectopic atrial rhythm 09/17/2016  . Arthritis of right hip 09/12/2016    Class: Chronic  . Osteoarthritis of right hip 11/10/2015  . Chronic pain of right hip 11/09/2015  . Alcoholic encephalopathy (HCC) 16/12/9602  . Vitamin D deficiency 06/28/2015  . Abnormal echocardiography   . Alcoholic hepatitis   . Altered mental status 06/15/2015  . Hepatic cirrhosis (HCC) 06/15/2015  . Elevated troponin 06/15/2015  . Elevated CK 06/15/2015  . Fall 06/15/2015  . Maxillary sinus fracture (HCC) 04/05/2015  . Elevated LFTs 11/21/2014  . Inflammatory liver  disease 11/21/2014  . Alcohol abuse 11/21/2014  . Hypokalemia   . Hypomagnesemia   . Acute encephalopathy   . Increased liver enzymes   . Hypertensive heart disease 11/10/2014  . Pure hypercholesterolemia 10/19/2014  . Polyarthritis of multiple sites   . Esophageal reflux   . Alcoholic cirrhosis (HCC)   . Shortness of breath 04/25/2014  . History of alcohol abuse 04/25/2014  . Lower extremity edema 04/25/2014  . Ascites 04/25/2014  . Rash 04/25/2014  . Left ankle pain 04/25/2014  . Hyponatremia   . Hyperbilirubinemia 03/01/2014  . Macrocytic anemia 03/01/2014  . Thrombocytopenia (HCC) 03/01/2014  . Alcohol dependence (HCC) 02/28/2014  . Alcoholic hepatitis without ascites 02/25/2014  . Acute hepatitis 02/25/2014    Palliative Care Assessment & Plan   Patient Profile:    Assessment:  acute ETOH cirrhosis ETOH hepatitis AKI: improved Generalized weakness/deconditiong.  Probably has adjustment disorder with depressed mood related to his overall health.  Low platelets due to liver disease, has high MELD score.   Recommendations/Plan:  Patient reports plan for discharge to skilled facility.  He is invested in plan to continue with aggressive interventions.  I do not feel that he has true insight into the severity of his condition.  Recommend palliative to follow at skilled facility if this can be arranged.   Code Status:    Code Status Orders  (From admission, onward)        Start     Ordered   05/03/17 1424  Full code  Continuous     05/03/17 1424    Code Status History    Date Active Date Inactive Code Status Order ID Comments User  Context   09/22/2016 12:38 09/23/2016 19:26 Full Code [id number]  Janetta Hora Inpatient   06/15/2015 05:15 06/20/2015 16:18 Full Code [id number]  Alberteen Sam, MD Inpatient   04/06/2015 00:52 04/08/2015 17:36 Full Code [id number]  Violeta Gelinas, MD Inpatient   11/10/2014 20:55 11/13/2014 17:12 Full Code [id number]   Maretta Bees, MD Inpatient   04/25/2014 12:45 04/26/2014 17:31 Full Code [id number]  Clydia Llano, MD ED   02/25/2014 20:30 03/04/2014 20:12 Full Code [id number]  Dorothea Ogle, MD Inpatient       Prognosis:   guarded.   Discharge Planning:  Skilled Nursing Facility for rehab with Palliative care service follow-up  Care plan was discussed with  Patient.   Thank you for allowing the Palliative Medicine Team to assist in the care of this patient.   Total Time 25 Prolonged Time Billed  no       Greater than 50%  of this time was spent counseling and coordinating care related to the above assessment and plan. Romie Minus, MD [facility] Palliative Medicine Team [phone]  Please contact Palliative Medicine Team phone at [phone] for questions and concerns.

## 2017-05-12 NOTE — Consult Note (Addendum)
Cardiology Consultation:   Patient ID: [name]; [id number]; 22-Mar-1950   Admit date: 05/03/2017 Date of Consult: 05/12/2017  Primary Care Provider: [name], NP Primary Cardiologist: Dr Dulce Sellar   Patient Profile:   Gary [name] is a 67 y.o. male with a hx of alcoholic hepatits who is being seen today for the evaluation of atrial tachycardia at the request of Dr Minus Liberty  History of Present Illness:   Mr. Gary [name] is a 67 y/o male with a history of alcoholism with cirrhosis. He was seen by Dr Royann Shivers in 2017 for cardiomyopathy- EF 45%. 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 He did not follow up as an OP and presumable continued to drink.   He was seen by Dr Dulce Sellar in July 2018 as a pre op (hip) clearance secondary to an abnormal EKG. Dr Dulce Sellar felt the pt had an ectopic atrial rhythm with PACs, and blocked PACs. It was recommended the pt continue his beta blocker. An echo then showed his EF to 65-70%.   He was admitted 05/03/17 with decompensated liver failure. He has chronic thrombocytopenia and is auto anticoagulated with an INR of 2 or greater. His telemetry shows ectopic atrial tachycardia with rates 100-124. The pt has no awareness of tachycardia and appears significantly debilitated. SNF has been suggested but the pt wants to return home.    Past Medical History:  Diagnosis Date  . Alcohol abuse   . Alcoholic cirrhosis (HCC)    Overview:  Hx of encephalopathy  . Alcoholic encephalopathy (HCC) 0/56/9794  . Alcoholic hepatitis without ascites 02/25/2014  . Altered mental status 06/15/2015  . Arthritis of right hip 09/12/2016  . Ascites 04/25/2014  . CHF (congestive heart failure) (HCC)   . Chronic pain of right hip 11/09/2015  . Esophageal reflux   . Essential hypertension 11/10/2014   Overview:  Was on lisinopril 20 mg daily per records  . GERD (gastroesophageal reflux disease)   . History of hiatal hernia   . Hyperbilirubinemia  03/01/2014   Overview:  Last Assessment & Plan:  Likely due to liver cirrhosis, ascites has macrocytic anemia, thrombocytopenia, hepatic encephalopathy/hyperammonemia - CT abdomen showed hepatic cirrhosis with degenerative nodules - Ultrasound showed severe hepatic steatosis, trace amount of perisplenic ascites - on steroids for acute alcoholic hepatitis, patient has GI appointment for further steroid taper outpatient , continue current dose for 28 days. - AFP 2.4 - Placed on propranolol, Aldactone 50 mg daily. Follow potassium level. SNF - cont propranolol and aldactone; f/u CMP for K and bilirubin  . Hypercholesterolemia   . Hypertension   . Inflammatory liver disease 11/21/2014  . Macrocytic anemia   . Maxillary sinus fracture (HCC) 04/05/2015  . Osteoarthritis of right hip 11/10/2015  . Polyarthritis of multiple sites   . Pure hypercholesterolemia 10/19/2014   Overview:  LDL 206 per records 03/2014. Not on statin due to liver disease  . Seizures (HCC)    pt. denies   . Thrombocytopenia (HCC)   . Tremors of nervous system     Past Surgical History:  Procedure Laterality Date  . TONSILLECTOMY    . TOTAL HIP ARTHROPLASTY Right 09/22/2016   Procedure: TOTAL HIP ARTHROPLASTY ANTERIOR APPROACH;  Surgeon: Gean Birchwood, MD;  Location: [facility] OR;  Service: Orthopedics;  Laterality: Right;  . WISDOM TOOTH EXTRACTION       Home Medications:  Prior to Admission medications   Medication Sig Start Date End Date Taking? Authorizing  Provider  aspirin EC 325 MG tablet Take 1 tablet (325 mg total) by mouth 2 (two) times daily. Patient taking differently: Take 325 mg by mouth daily.  09/22/16  Yes Allena Katz, PA-C  folic acid (FOLVITE) 1 MG tablet Take 1 mg by mouth daily.   Yes [provider]  furosemide (LASIX) 40 MG tablet Take 40 mg by mouth daily.   Yes [provider]  hydroxypropyl methylcellulose / hypromellose (ISOPTO TEARS / GONIOVISC) 2.5 % ophthalmic solution Place 1 drop  into both eyes 4 (four) times daily as needed for dry eyes. 06/20/15  Yes Rai, Ripudeep K, MD  Omega-3 Fatty Acids (FISH OIL) 500 MG CAPS Take 500 mg by mouth daily.   Yes [provider]  pantoprazole (PROTONIX) 40 MG tablet Take 1 tablet (40 mg total) by mouth daily. Patient taking differently: Take 40 mg by mouth daily before breakfast.  04/26/14  Yes Vassie Loll, MD  propranolol (INDERAL) 10 MG tablet Take 1 tablet (10 mg total) by mouth 2 (two) times daily. Patient taking differently: Take 10 mg by mouth daily.  06/20/15  Yes Rai, Ripudeep K, MD  spironolactone (ALDACTONE) 50 MG tablet Take 1 tablet (50 mg total) by mouth daily. 06/20/15  Yes Rai, Ripudeep K, MD  tiZANidine (ZANAFLEX) 2 MG tablet Take 1 tablet (2 mg total) by mouth every 6 (six) hours as needed for muscle spasms. 09/22/16  Yes Allena Katz, PA-C  triamcinolone ointment (KENALOG) 0.1 % Apply 1 application topically 2 (two) times daily. 02/13/17  Yes [provider]  lactulose (CHRONULAC) 10 GM/15ML solution Take 45 mLs (30 g total) by mouth 3 (three) times daily. Patient not taking: Reported on 05/03/2017 11/13/14   Lonia Blood, MD  Multiple Vitamin (MULTIVITAMIN WITH MINERALS) TABS tablet Take 1 tablet by mouth daily. Patient not taking: Reported on 05/03/2017 03/04/14   Rodolph Bong, MD  oxyCODONE-acetaminophen (ROXICET) 5-325 MG tablet Take 1 tablet by mouth every 4 (four) hours as needed. Patient not taking: Reported on 05/03/2017 09/22/16   Allena Katz, PA-C    Inpatient Medications: Scheduled Meds: . aspirin EC  325 mg Oral Daily  . fluconazole  100 mg Oral Daily  . folic acid  1 mg Oral Daily  . heparin  5,000 Units Subcutaneous Q8H  . lactulose  30 g Oral TID  . lactulose  300 mL Rectal Once  . liver oil-zinc oxide   Topical BID  . mirtazapine  15 mg Oral QHS  . multivitamin with minerals  1 tablet Oral Daily  . nystatin ointment   Topical BID  . pantoprazole  40 mg Oral QAC  breakfast  . prednisoLONE  40 mg Oral Daily  . propranolol  10 mg Oral Daily  . rifaximin  550 mg Oral BID  . sodium chloride flush  3 mL Intravenous Q12H  . thiamine  100 mg Oral Daily   Or  . thiamine  100 mg Intravenous Daily   Continuous Infusions:  PRN Meds: naphazoline-glycerin, ondansetron **OR** ondansetron (ZOFRAN) IV  Allergies:    Allergies  Allergen Reactions  . Ace Inhibitors Other (See Comments)    Cough with lisinopril    Social History:   Social History   Socioeconomic History  . Marital status: Married    Spouse name: Not on file  . Number of children: 2  . Years of education: Not on file  . Highest education level: Not on file  Social Needs  .  Financial resource strain: Not on file  . Food insecurity - worry: Not on file  . Food insecurity - inability: Not on file  . Transportation needs - medical: Not on file  . Transportation needs - non-medical: Not on file  Occupational History  . Occupation: Retired  Tobacco Use  . Smoking status: Never Smoker  . Smokeless tobacco: Never Used  Substance and Sexual Activity  . Alcohol use: Yes    Alcohol/week: 4.8 - 7.8 oz    Types: 7 - 12 Cans of beer, 1 Shots of liquor per week    Comment: daily  . Drug use: No  . Sexual activity: Yes  Other Topics Concern  . Not on file  Social History Narrative  . Not on file    Family History:    Family History  Problem Relation Age of Onset  . Heart failure Father   . Alcohol abuse Father   . Heart attack Father   . COPD Mother   . Colon cancer Neg Hx   . Colon polyps Neg Hx   . Diabetes Neg Hx   . Kidney disease Neg Hx   . Gallbladder disease Neg Hx   . Esophageal cancer Neg Hx      ROS:  Please see the history of present illness.  All other ROS reviewed and negative.     Physical Exam/Data:   Vitals:   05/11/17 0546 05/11/17 1647 05/11/17 2122 05/12/17 0508  BP: 107/76 101/62 99/67 92/60   Pulse: (!) 107 67 67 (!) 118  Resp: 17 18 17 18     Temp: 98.2 F (36.8 C) 98 F (36.7 C) 98.2 F (36.8 C) 99 F (37.2 C)  TempSrc: Oral Oral Oral Oral  SpO2: 94% 95% 97% 95%  Weight:      Height:        Intake/Output Summary (Last 24 hours) at 05/12/2017 1050 Last data filed at 05/12/2017 1000 Gross per 24 hour  Intake 840 ml  Output -  Net 840 ml   Filed Weights   05/03/17 1532 05/04/17 0359  Weight: 228 lb 2.8 oz (103.5 kg) 231 lb 0.7 oz (104.8 kg)   Body mass index is 33.15 kg/m.  General:  Jaundiced, NAD HEENT: normal Lymph: no adenopathy Neck: no JVD Endocrine:  No thryomegaly Vascular: No carotid bruits; FA pulses 2+ bilaterally without bruits  Cardiac:  normal S1, S2; RRR; no murmur  Lungs:  Decreased breath sounds  Abd: ascites Ext: trace LE edema Musculoskeletal:  No deformities, BUE and BLE strength normal and equal Skin: warm and dry  Neuro:  CNs 2-12 intact, no focal abnormalities noted Psych:  Normal affect   EKG:  The EKG was personally reviewed and demonstrates:  Atrial tachycardia Telemetry:  Telemetry was personally reviewed and demonstrates:  Atrial tachycardia with variable rates  Relevant CV Studies: Echo 09/19/16- Study Conclusions  - Procedure narrative: Transthoracic echocardiography. Image   quality was adequate. The study was technically difficult.   Intravenous contrast (Definity) was administered. - Left ventricle: The cavity size was normal. Wall thickness was   normal. Systolic function was vigorous. The estimated ejection   fraction was in the range of 65% to 70%. Wall motion was normal;   there were no regional wall motion abnormalities. Doppler   parameters are consistent with abnormal left ventricular   relaxation (grade 1 diastolic dysfunction).  Laboratory Data:  Chemistry Recent Labs  Lab 05/10/17 0625 05/11/17 0456 05/12/17 0458  NA 126* 129* 129*  K  4.0 4.0 3.6  CL 98* 99* 101  CO2 22 22 22   GLUCOSE 119* 146* 101*  BUN 33* 30* 27*  CREATININE 1.07 0.84 0.88   CALCIUM 7.9* 8.1* 8.2*  GFRNONAA >60 >60 >60  GFRAA >60 >60 >60  ANIONGAP 6 8 6     Recent Labs  Lab 05/10/17 0625 05/11/17 0456 05/12/17 0458  PROT 5.0* 5.4* 5.3*  ALBUMIN 1.7* 1.8* 1.8*  AST 112* 121* 118*  ALT 66* 72* 77*  ALKPHOS 97 100 97  BILITOT 19.2* 18.5* 18.3*   Hematology Recent Labs  Lab 05/06/17 0424 05/08/17 0444  WBC 8.0 9.7  RBC 2.85* 2.85*  HGB 11.1* 11.1*  HCT 30.1* 30.4*  MCV 105.6* 106.7*  MCH 38.9* 38.9*  MCHC 36.9* 36.5*  RDW 18.5* 18.6*  PLT 101* 114*   Cardiac EnzymesNo results for input(s): TROPONINI in the last 168 hours. No results for input(s): TROPIPOC in the last 168 hours.  BNPNo results for input(s): BNP, PROBNP in the last 168 hours.  DDimer No results for input(s): DDIMER in the last 168 hours.  Radiology/Studies:  No results found.  Assessment and Plan:   Atrial tachycardia/ ectopic atrial rhythm Chronic. He is on low dose Inderal.  NICM- EF 40-45% 2017, 65-70% July 2018. Myoview low risk 2017  Decompensated alcoholic cirrhosis Pt has chronically low platelets and high INR from liver disease  Plan: MD to see. Consider changing his Inderal to metoprolol (renally excreted). Would also consider DC telemetry.   For questions or updates, please contact [facility] Please consult [email or web address] for contact info under Cardiology/STEMI.   Signed, Rollene Rotunda, MD  05/12/2017 10:50 AM   History and all data above reviewed.  Patient examined.  The patient has PAT.  I reviewed tele and he has frequent paroxysms of this.  He does not feel this.  This has been longstanding.  The patient denies any new symptoms such as chest discomfort, neck or arm discomfort. There has been no new shortness of breath, PND or orthopnea. There have been no reported palpitations, presyncope or syncope.  Treatment has been limited by his severe comorbid conditions including hypotension.  I agree with the findings as above.  The patient exam reveals  COR:RRR  ,  Lungs: Clear  ,  Abd: Distended with fluid wave, Ext   Diffuse severe edema  .  All available labs, radiology testing, previous records reviewed. Agree with documented assessment and plan. Atrial tachycardia:  This is a relatively minor problem given the overall picture.  He is not having any overt symptoms related to this.  We might be able to control this slightly better if we can increase his beta blocker.  I would suggest changing to low dose metoprolol if he can tolerate this.   Fayrene Fearing Kaidence Callaway  10:51 AM  05/12/2017

## 2017-05-12 NOTE — Progress Notes (Addendum)
Patient ID: Gary [name], male   DOB: 1951-01-25, 67 y.o.   MRN: [id number]    Progress Note   Subjective    No complaints - nursing says has been incontinent of stool- but apparently was able to get the bathroom with walker yesterday  Several times  Eating well  Diuretics on hold  Pt now has bed offers at two facilities- waiting for insurance authorization - pt agreeable.   Objective   Vital signs in last 24 hours: Temp:  [98 F (36.7 C)-99 F (37.2 C)] 99 F (37.2 C) (03/12 0508) Pulse Rate:  [67-118] 118 (03/12 0508) Resp:  [17-18] 18 (03/12 0508) BP: (92-101)/(60-67) 92/60 (03/12 0508) SpO2:  [95 %-97 %] 95 % (03/12 0508) Last BM Date: 05/11/17 General:    white male  in NAD- deeply jaundiced - oriented but mentation slow Heart:  Regular rate and rhythm; no murmurs Lungs: Respirations even and unlabored, lungs CTA bilaterally Abdomen:  Soft, nontender large , nontense ascites with umbilical hernia  Normal bowel sounds. Extremities:  Without edema. Neurologic:  Alert and oriented,  Persistent asterixis Psych:  Cooperative. Normal mood and affect.  Intake/Output from previous day: 03/11 0701 - 03/12 0700 In: 840 [P.O.:840] Out: -  Intake/Output this shift: No intake/output data recorded.  Lab Results: No results for input(s): WBC, HGB, HCT, PLT in the last 72 hours. BMET Recent Labs    05/10/17 0625 05/11/17 0456 05/12/17 0458  NA 126* 129* 129*  K 4.0 4.0 3.6  CL 98* 99* 101  CO2 22 22 22   GLUCOSE 119* 146* 101*  BUN 33* 30* 27*  CREATININE 1.07 0.84 0.88  CALCIUM 7.9* 8.1* 8.2*   LFT Recent Labs    05/12/17 0458  PROT 5.3*  ALBUMIN 1.8*  AST 118*  ALT 77*  ALKPHOS 97  BILITOT 18.3*   PT/INR Recent Labs    05/10/17 0625  LABPROT 26.1*  INR 2.42       Assessment / Plan:     #1  67 yo male with severe decompensated alcoholic liver disease/cirrhossi and severe alcoholic hepatitis  On prednisolone day #8- plan 30 day couse of prednisone  (already day 8) at 40 mg daily then taper over 2 weeks DF score currently 69 - 30 day mortality  risk  Up to 50 % Parameters have been stable - some minimal trend toward improvement  Anticipate months of recovery  from severe ETOH hepatitis, if he survives and if he can stay off ETOH. #2 ascites - nontense - hold diuretics until sodium greater than 132 and BUN less than 25. When restarting, lower dose at Aldactone 20 milligrams daily and Lasix 20 mg daily #3 Variceal screening - will need eventual EGD which can be done outpt- continue Nadolol. #4 Encephalopathy  - Continue Xifaxan 550 BID, and Lactulose 3 x daily  Will decrease to 30 cc TID  To decrease incontinence. Goal for this patient would be 3-4 bowel movements per day #5 Dispo - his best chance for recovery is SNF placement  Which hopefully is going to work out - he is stable for D/C to SNF - will need frequent labs  q5-7 days at facility (CBC, comprehensive metabolic panel, PT/INR). #6. After being discharged from skilled nursing facility outpatient GI follow-up with Dr. Lavon Paganini or her advanced practitioner once 2 weeks thereafter  Contact  Amy Esterwood, P.A.-C               [phone]   Principal Problem:  Hyperbilirubinemia Active Problems:   Hyponatremia   AKI (acute kidney injury) (HCC)   Jaundice   Encephalopathy, hepatic (HCC)   Junctional rhythm   Pressure injury of skin   LOS: 9 days   Amy Esterwood  05/12/2017, 9:46 AM   GI ATTENDING  Interval history data reviewed. Patient seen and examined. Agree with interval progress note as outlined above. I'm glad to see that the transition from the hospital to skilled nursing facility when appropriate. He needs this environment. PLEASE SEE HIGHLIGHTED AREAS OF ABOVE NOTE regarding final GI recommendations at this point. Please call for questions or problems. Will sign off.  Wilhemina Bonito. Eda Keys., M.D. [facility] Division of Gastroenterology

## 2017-05-12 NOTE — Progress Notes (Signed)
PROGRESS NOTE    DARTANIAN KNAGGS  ZOX:[id number] DOB: January 14, 1951 DOA: 05/03/2017 PCP: [name], NP   Brief Narrative: [name] a 67 y.o.malepast medical history of arthritis, hypertension, reflux, alcohol abuse, lower extremity edema, heart failure presents to the emergency room with chief complaint of weakness and jaundice. Patient states that he has been feeling weaker over the last 4 weeks. He also had several weeks of red painful scrotum and red irritated gluteal cleft.  History of chronic alcohol abuse. Patient was found to have elevated bilirubin,INR and  LFT on presentation. GI has been  following.  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 Due to his poor prognosis, palliative care is also involved in the management.  Patient wants to remain as full code.  Physical therapy evaluated the patient and recommended skilled nursing facility on discharge.  Currently he is waiting for skilled nursing facility bed.  Assessment & Plan:   Principal Problem:   Hyperbilirubinemia Active Problems:   Hyponatremia   AKI (acute kidney injury) (HCC)   Jaundice   Encephalopathy, hepatic (HCC)   Junctional rhythm   Pressure injury of skin  Acute decompensated alcoholic liver cirrhosis: Maddrey Discriminant Function Score was >60; Started on  Prednisolone,day 8.  Plan is to continue 30-day course of prednisolone at 40 mg and taper over 2 weeks. He was on  Furosemide 40 mg  , Spironolactone 50 daily.  They have been held because of hyponatremia and increasing BUN.  GI recommends to hold diuretics until sodium is greater than 132 and BUN is less than 25.  When restarting, Lasix 20 mg and Aldactone 20 mg daily should be ordered. Underwent Paracentesis once to exclude SBP; Cytology showed just inflammatory cells. WBC in Peritoneal  Fluid was 181 Counseled on EtOH Abuse cessation. He has very poor prognosis.  Given current DF score 30-day  mortality risk is up to 50%. US abdomen on 05/05/17  showed:Hepatic cirrhosis with stable left hepatic cyst measuring approximately 1.4 x 1.1 x 1.5 cm, Small volume of ascites GI planning for repeating EGD to rule out varices in near future.  He will follow-up with GI as an outpatient Given his poor prognosis,palliative care is also following.But patient wants to remains as full code.  Atrial tachycardia: Cardiology monitor the patient.  Changed propranolol to low-dose metoprolol 3 times a day.  Alcoholic hepatitis: Liver enzymes slightly improved.  GI following, started prednisolone . Elevated ammonia level: Slightly better today. Continue oral lactulose 30 cc TID , rifaximin BID.  Mental status has not deteriorated and he is currently alert and oriented.  Goal will be 3-4 bowel movements per day.  Remains significantly weak.  Hyponatremia: We will continue to monitor.Diuretics on hold  Lactic acidosis:Resolved  Scrotal fungal skin infection: Continue nystatin and scrotal support.Added fluconazole  Hypertension: BP stable.Also on metoprolol  Generalized weakness: Physical therapy evaluate the patient and recommended skilled nursing facility.  SW on board.  Acute kidney injury: Kidney function has improved .    GERD: Continue PPI  Anemia/Thrombocytopenia/elevated INR: Secondary to end stage liver disease .  Currently stable. Continue folic acid and thiamine  Depressed mood: Started on Remeron    DVT prophylaxis:SCD Code Status: Full Family Communication: Discussed with son on phone Disposition Plan: Skilled nursing facility after the bed is available  Consultants: Gastroenterology  Procedures: Paracentesis  Antimicrobials:Zosyn since 05/03/17-05/07/17  Subjective: Patient seen and examined the patient this morning.  No new issues/events since yesterday.  Looks weak but alert and oriented.  Denies any abdominal pain or shortness of breath  Objective: Vitals:   05/11/17 0546  05/11/17 1647 05/11/17 2122 05/12/17 0508  BP: 107/76 101/62 99/67   Pulse: (!) 107 67 67 (!) 118  Resp: 17 18 17 18   Temp: 98.2 F (36.8 C) 98 F (36.7 C) 98.2 F (36.8 C) 99 F (37.2 C)  TempSrc: Oral Oral Oral Oral  SpO2: 94% 95% 97% 95%  Weight:      Height:        Intake/Output Summary (Last 24 hours) at 05/12/2017 1455 Last data filed at 05/12/2017 1000 Gross per 24 hour  Intake 600 ml  Output -  Net 600 ml   Filed Weights   05/03/17 1532 05/04/17 0359  Weight: 103.5 kg (228 lb 2.8 oz) 104.8 kg (231 lb 0.7 oz)    Examination:  General exam: Chronically ill, not in distress,average built HEENT:PERRL,Oral mucosa moist, Ear/Nose normal on gross exam Respiratory system: Bilateral decreased air entry on the bases  cardiovascular system: S1 & S2 heard, irregular, No JVD, murmurs, rubs, gallops or clicks. Gastrointestinal system: Abdomen is distended, soft and nontender. No organomegaly or masses felt. Normal bowel sounds heard. Central nervous system: Alert and oriented. No focal neurological deficits. Extremities: Trace edema, no clubbing ,no cyanosis, distal peripheral pulses palpable. Skin: icteric,No rashes, lesions or ulcers ,no pallor GU: Improved redness and swelling of the scrotum    Data Reviewed: I have personally reviewed following labs and imaging studies  CBC: Recent Labs  Lab 05/06/17 0424 05/08/17 0444  WBC 8.0 9.7  NEUTROABS 4.7 6.2  HGB 11.1* 11.1*  HCT 30.1* 30.4*  MCV 105.6* 106.7*  PLT 101* 114*   Basic Metabolic Panel: Recent Labs  Lab 05/06/17 0424 05/07/17 0433 05/08/17 0444 05/09/17 0502 05/10/17 0625 05/11/17 0456 05/12/17 0458  NA 131* 127* 127* 128* 126* 129* 129*  K 3.4* 3.6 3.6 4.2 4.0 4.0 3.6  CL 101 99* 99* 99* 98* 99* 101  CO2 22 20* 19* 22 22 22 22   GLUCOSE 124* 109* 112* 130* 119* 146* 101*  BUN 28* 26* 28* 31* 33* 30* 27*  CREATININE 0.86 1.01 1.15 1.08 1.07 0.84 0.88  CALCIUM 8.2* 7.8* 7.9* 8.0* 7.9* 8.1* 8.2*    MG 1.6* 1.9  --   --   --   --   --   PHOS 2.7  --   --   --   --   --   --    GFR: Estimated Creatinine Clearance: 98.7 mL/min (by C-G formula based on SCr of 0.88 mg/dL). Liver Function Tests: Recent Labs  Lab 05/08/17 0444 05/09/17 0502 05/10/17 0625 05/11/17 0456 05/12/17 0458  AST 144* 129* 112* 121* 118*  ALT 73* 68* 66* 72* 77*  ALKPHOS 98 102 97 100 97  BILITOT 19.9* 20.0* 19.2* 18.5* 18.3*  PROT 5.1* 5.0* 5.0* 5.4* 5.3*  ALBUMIN 1.8* 1.7* 1.7* 1.8* 1.8*   No results for input(s): LIPASE, AMYLASE in the last 168 hours. Recent Labs  Lab 05/08/17 0444 05/09/17 0502 05/10/17 0625 05/11/17 0456 05/12/17 0458  AMMONIA 61* 79* 86* 72* 48*   Coagulation Profile: Recent Labs  Lab 05/10/17 0625  INR 2.42   Cardiac Enzymes: No results for input(s): CKTOTAL, CKMB, CKMBINDEX, TROPONINI in the last 168 hours. BNP (last 3 results) No results for input(s): PROBNP in the last 8760 hours. HbA1C: No results for input(s): HGBA1C in the last 72 hours. CBG: Recent Labs  Lab 05/06/17 1153 05/06/17 1728  GLUCAP 126* 131*   Lipid Profile: No results for input(s): CHOL, HDL, LDLCALC, TRIG, CHOLHDL, LDLDIRECT in the last 72 hours. Thyroid Function Tests: No results for input(s): TSH, T4TOTAL, FREET4, T3FREE, THYROIDAB in the last 72 hours. Anemia Panel: No results for input(s): VITAMINB12, FOLATE, FERRITIN, TIBC, IRON, RETICCTPCT in the last 72 hours. Sepsis Labs: No results for input(s): PROCALCITON, LATICACIDVEN in the last 168 hours.  Recent Results (from the past 240 hour(s))  Culture, blood (routine x 2)     Status: None   Collection Time: 05/03/17  8:44 AM  Result Value Ref Range Status   Specimen Description BLOOD RIGHT ANTECUBITAL  Final   Special Requests   Final    BOTTLES DRAWN AEROBIC AND ANAEROBIC Blood Culture adequate volume Performed at [facility], [address], [place], [place] [address]    Culture   Final    NO GROWTH 5  DAYS Performed at [facility], [address] [place], [place] [address]    Report Status 05/08/2017 FINAL  Final  Culture, blood (routine x 2)     Status: None   Collection Time: 05/03/17  8:49 AM  Result Value Ref Range Status   Specimen Description BLOOD LEFT ANTECUBITAL  Final   Special Requests   Final    BOTTLES DRAWN AEROBIC AND ANAEROBIC Blood Culture adequate volume Performed at [facility], [address] [place], [place] [address]    Culture   Final    NO GROWTH 5 DAYS Performed at [facility], [address] [place], [place] [address]    Report Status 05/08/2017 FINAL  Final  Gram stain     Status: None   Collection Time: 05/03/17  2:30 PM  Result Value Ref Range Status   Specimen Description ABDOMEN FLUID  Final   Special Requests NONE  Final   Gram Stain   Final    WBC PRESENT,BOTH PMN AND MONONUCLEAR NO ORGANISMS SEEN CRITICAL RESULT CALLED TO, READ BACK BY AND VERIFIED WITH: J.Baruch Merl, RN 05/03/17 @1938  BY V.WILKINS Performed at [facility], [address], [place], [place] [address]    Report Status 05/03/2017 FINAL  Final         Radiology Studies: No results found.      Scheduled Meds: . aspirin EC  325 mg Oral Daily  . fluconazole  100 mg Oral Daily  . folic acid  1 mg Oral Daily  . heparin  5,000 Units Subcutaneous Q8H  . lactulose  30 g Oral TID  . lactulose  300 mL Rectal Once  . liver oil-zinc oxide   Topical BID  . metoprolol tartrate  12.5 mg Oral TID  . mirtazapine  15 mg Oral QHS  . multivitamin with minerals  1 tablet Oral Daily  . nystatin ointment   Topical BID  . pantoprazole  40 mg Oral QAC breakfast  . prednisoLONE  40 mg Oral Daily  . rifaximin  550 mg Oral BID  . sodium chloride flush  3 mL Intravenous Q12H  . thiamine  100 mg Oral Daily   Or  . thiamine  100 mg Intravenous Daily   Continuous Infusions:    LOS: 9 days    Time spent: 25 mins.More than 50% of  that time was spent in counseling and/or coordination of care.      Meredith Leeds, MD [facility] Pager [phone]  If 7PM-7AM, please contact  night-coverage [email or web address] Password [facility] 05/12/2017, 2:55 PM

## 2017-05-13 LAB — COMPREHENSIVE METABOLIC PANEL
ALK PHOS: 105 U/L (ref 38–126)
ALT: 79 U/L — ABNORMAL HIGH (ref 17–63)
ANION GAP: 7 (ref 5–15)
AST: 121 U/L — ABNORMAL HIGH (ref 15–41)
Albumin: 1.6 g/dL — ABNORMAL LOW (ref 3.5–5.0)
BUN: 26 mg/dL — ABNORMAL HIGH (ref 6–20)
CALCIUM: 8.2 mg/dL — AB (ref 8.9–10.3)
CHLORIDE: 102 mmol/L (ref 101–111)
CO2: 21 mmol/L — AB (ref 22–32)
Creatinine, Ser: 0.83 mg/dL (ref 0.61–1.24)
GFR calc non Af Amer: >60 mL/min (ref >60)
Glucose, Bld: 127 mg/dL — ABNORMAL HIGH (ref 65–99)
POTASSIUM: 4 mmol/L (ref 3.5–5.1)
SODIUM: 130 mmol/L — AB (ref 135–145)
Total Bilirubin: 17.8 mg/dL — ABNORMAL HIGH (ref 0.3–1.2)
Total Protein: 5.1 g/dL — ABNORMAL LOW (ref 6.5–8.1)

## 2017-05-13 LAB — CBC WITH DIFFERENTIAL/PLATELET
BASOS ABS: 0 10*3/uL (ref 0.0–0.1)
BASOS PCT: 0 %
Eosinophils Absolute: 0 10*3/uL (ref 0.0–0.7)
Eosinophils Relative: 0 %
HEMATOCRIT: 30.6 % — AB (ref 39.0–52.0)
Hemoglobin: 10.9 g/dL — ABNORMAL LOW (ref 13.0–17.0)
Lymphocytes Relative: 8 %
Lymphs Abs: 1 10*3/uL (ref 0.7–4.0)
MCH: 38.2 pg — ABNORMAL HIGH (ref 26.0–34.0)
MCHC: 35.6 g/dL (ref 30.0–36.0)
MCV: 107.4 fL — ABNORMAL HIGH (ref 78.0–100.0)
MONO ABS: 1 10*3/uL (ref 0.1–1.0)
Monocytes Relative: 8 %
NEUTROS ABS: 10.8 10*3/uL — AB (ref 1.7–7.7)
Neutrophils Relative %: 84 %
Platelets: 98 10*3/uL — ABNORMAL LOW (ref 150–400)
RBC: 2.85 MIL/uL — ABNORMAL LOW (ref 4.22–5.81)
RDW: 17.8 % — AB (ref 11.5–15.5)
WBC: 12.8 10*3/uL — AB (ref 4.0–10.5)

## 2017-05-13 LAB — AMMONIA: Ammonia: 71 umol/L — ABNORMAL HIGH (ref 9–35)

## 2017-05-13 LAB — MAGNESIUM: MAGNESIUM: 2.1 mg/dL (ref 1.7–2.4)

## 2017-05-13 LAB — PHOSPHORUS: PHOSPHORUS: 3.2 mg/dL (ref 2.5–4.6)

## 2017-05-13 NOTE — Progress Notes (Signed)
PROGRESS NOTE    Gary [name]  ZOX:[id number] DOB: [birth year] DOA: 05/03/2017 PCP: Gary Hansen, NP   Brief Narrative:  Gary [name] a 67 y.o.malepast medical history of arthritis, hypertension, reflux, alcohol abuse, lower extremity edema, heart failure presents to the emergency room with chief complaint of weakness and jaundice. Patient states that he has been feeling weaker over the last 4 weeks. He also had several weeks of red painful scrotum and red irritated gluteal cleft.  History of chronic alcohol abuse.   Patient was found to have elevated bilirubin, INR and LFTs on presentation. GI has been  following. Currently he is being treated for decompensated alcohol liver cirrhosis and alcohol hepatitis and was started on Prednisolone.  Due to his poor prognosis, palliative care was also involved in the management.  Patient wants to remain as full code.  Physical therapy evaluated the patient and recommended skilled nursing facility on discharge. Currently he is waiting for skilled nursing facility bed.  Yesterday Cardiology was consulted due to Atrial Tachycardia. Cardiology adjusted his medications by changing to Metoprolol with improvement.   Assessment & Plan:   Principal Problem:   Hyperbilirubinemia Active Problems:   Hyponatremia   AKI (acute kidney injury) (HCC)   Jaundice   Encephalopathy, hepatic (HCC)   Junctional rhythm   Pressure injury of skin  Acute Decompensated Alcoholic Liver Cirrhosis: -Maddrey Discriminant Function Score was >60;  -Started on  Prednisolone, Day 9.  Plan is to continue 30-day course of prednisolone at 40 mg and taper over 2 weeks. -He was on Furosemide 40 mg  , Spironolactone 50 daily.  They have been held because of hyponatremia and increasing BUN.  GI recommends to hold diuretics until sodium is greater than 132 and BUN is less than 25.  When restarting, Lasix 20 mg and Aldactone 20 mg daily should be ordered. -Underwent Paracentesis  once to exclude SBP; Cytology showed just inflammatory cells. -WBC in Peritoneal  Fluid was 181 -Counseled on EtOH Abuse cessation. -He has very poor prognosis.  Given current DF score 30-day mortality risk is up to 50%. -US abdomen on 05/05/17  showed: Hepatic cirrhosis with stable left hepatic cyst measuring approximately 1.4 x 1.1 x 1.5 cm, Small volume of ascites GI planning for repeating EGD to rule out varices in near future.   -He will follow-up with GI as an outpatient -Given his poor prognosis,palliative care is also following.But patient wants to remains as full code. -PT/OT Consulted and recommending SNF  Atrial Ttachycardia:  -Cardiology consulted  -Changed propranolol to low-dose metoprolol 3 times a day with improvement -C/w Telemetry for now  -C/w AS 325 mg po Daily   Alcoholic Hepatitis:  -Liver enzymes appear stable now and AST is 121 and ALT is 79.   -GI was following, started Prednisolone 40 mg po Daily with Taper. -As Above  Hyperammonemia -Ammonia Level was 71 this AM -Continue oral lactulose 30 cc TID, Rifaximin 550 BID.   -Mental status has not deteriorated and he is currently alert and oriented.  Goal will be 3-4 bowel movements per day.  Remains significantly weak.  Hyperbilirubinemia -Improving; T Bili now 17.8 -C/w Treatment as above -Repeat CMP in AM   Hyponatremia:  -Na+ went from 127 -> 130 -We will continue to monitor. -Diuretics currently on hold until Na+ >132 and BUN <25   Lactic acidosis -Resolved  Scrotal fungal skin infection:  -Continue nystatin, Desitin, and scrotal support. -Fluconazole to stop tomorrow   Hypertension:  -BP  stable. Also on Metoprolol 12.5 mg po TID  Generalized Weakness and Deconditioning  -Physical therapy evaluate the patient and recommended skilled nursing facility. SW on board and appreciate assistance with placing patient   Acute kidney injury:  -Kidney function has improved and BUN/Cr now  26/0.83 -Repeat CMP in AM   GERD -Continue PPI with Protonix 40 mg po Daily   Anemia/Thrombocytopenia/elevated INR:  -Secondary to end stage liver disease. Relatively Stable  -Hb/Hct was 10.9/30.6, Platelet Count was 98, and INR was  -Continue Folic acid and Thiamine  Depressed mood -C/w Mirtazapine 15 mg qHS  DVT prophylaxis: Heparin 5,000 units sq q8h  Code Status: FULL CODE Family Communication: No family present at bedside  Disposition Plan: Anticipate D/C to SNF when bed available   Consultants:   Gastroenterology   Cardiology   Palliative Care Medicine    Procedures: None   Antimicrobials:  Anti-infectives (From admission, onward)   Start     Dose/Rate Route Frequency Ordered Stop   05/09/17 1000  fluconazole (DIFLUCAN) tablet 100 mg     100 mg Oral Daily 05/08/17 1012 05/15/17 0959   05/08/17 1100  fluconazole (DIFLUCAN) tablet 200 mg     200 mg Oral  Once 05/08/17 1012 05/08/17 1043   05/07/17 1000  rifaximin (XIFAXAN) tablet 550 mg     550 mg Oral 2 times daily 05/07/17 0915     05/03/17 2000  piperacillin-tazobactam (ZOSYN) IVPB 3.375 g  Status:  Discontinued     3.375 g 12.5 mL/hr over 240 Minutes Intravenous Every 8 hours 05/03/17 1851 05/07/17 0912   05/03/17 0930  piperacillin-tazobactam (ZOSYN) IVPB 3.375 g     3.375 g 100 mL/hr over 30 Minutes Intravenous  Once 05/03/17 0926 05/03/17 1020   05/03/17 0930  vancomycin (VANCOCIN) IVPB 1000 mg/200 mL premix     1,000 mg 200 mL/hr over 60 Minutes Intravenous  Once 05/03/17 0926 05/03/17 1136     Subjective: Seen and examined and had no complaints. No nausea or vomiting. States his biggest concern was his discomfort in his buttocks. Wanting to go to SNF.   Objective: Vitals:   05/13/17 0500 05/13/17 1033 05/13/17 1424 05/13/17 1614  BP: 113/73 92/73 108/69 112/74  Pulse: (!) 112 (!) 118 67 69  Resp: 20  20   Temp: 97.7 F (36.5 C)  97.7 F (36.5 C)   TempSrc: Oral  Oral   SpO2: 95% 96% 95%  96%  Weight:      Height:        Intake/Output Summary (Last 24 hours) at 05/13/2017 1840 Last data filed at 05/13/2017 0600 Gross per 24 hour  Intake 723 ml  Output -  Net 723 ml   Filed Weights   05/03/17 1532 05/04/17 0359  Weight: 103.5 kg (228 lb 2.8 oz) 104.8 kg (231 lb 0.7 oz)   Examination: Physical Exam:  Constitutional: WN/WD Caucasian Jaundiced male in NAD and appears calm and comfortable Eyes: Lids and conjunctivae normal, sclerae icteric  ENMT: External Ears, Nose appear normal. Grossly normal hearing. Mucous membranes are moist. Poor Dentition  Neck: Appears normal, supple, no cervical masses, normal ROM, no appreciable thyromegaly, no JVD Respiratory: Diminished to auscultation bilaterally, no wheezing, rales, rhonchi or crackles. Normal respiratory effort and patient is not tachypenic. No accessory muscle use.  Cardiovascular: RRR, no murmurs / rubs / gallops. S1 and S2 auscultated. 2+ LE extremity edema.  Abdomen: Soft, non-tender,Distended due to body habitus. No masses palpated. No appreciable hepatosplenomegaly. Bowel sounds  positive x4.  GU: Deferred. Musculoskeletal: No clubbing / cyanosis of digits/nails. No joint deformity upper and lower extremities.  Skin: Had some scrotal erythema. Buttock not viewed No induration; Warm and dry.  Neurologic: CN 2-12 grossly intact with no focal deficits. Romberg sign and cerebellar reflexes not assessed.  Psychiatric: Normal judgment and insight. Alert and oriented x 3. Normal mood and appropriate affect.   Data Reviewed: I have personally reviewed following labs and imaging studies  CBC: Recent Labs  Lab 05/08/17 0444 05/13/17 0424  WBC 9.7 12.8*  NEUTROABS 6.2 10.8*  HGB 11.1* 10.9*  HCT 30.4* 30.6*  MCV 106.7* 107.4*  PLT 114* 98*   Basic Metabolic Panel: Recent Labs  Lab 05/07/17 0433  05/09/17 0502 05/10/17 0625 05/11/17 0456 05/12/17 0458 05/13/17 0424  NA 127*   < > 128* 126* 129* 129* 130*  K  3.6   < > 4.2 4.0 4.0 3.6 4.0  CL 99*   < > 99* 98* 99* 101 102  CO2 20*   < > 22 22 22 22  21*  GLUCOSE 109*   < > 130* 119* 146* 101* 127*  BUN 26*   < > 31* 33* 30* 27* 26*  CREATININE 1.01   < > 1.08 1.07 0.84 0.88 0.83  CALCIUM 7.8*   < > 8.0* 7.9* 8.1* 8.2* 8.2*  MG 1.9  --   --   --   --   --  2.1  PHOS  --   --   --   --   --   --  3.2   < > = values in this interval not displayed.   GFR: Estimated Creatinine Clearance: 104.7 mL/min (by C-G formula based on SCr of 0.83 mg/dL). Liver Function Tests: Recent Labs  Lab 05/09/17 0502 05/10/17 0625 05/11/17 0456 05/12/17 0458 05/13/17 0424  AST 129* 112* 121* 118* 121*  ALT 68* 66* 72* 77* 79*  ALKPHOS 102 97 100 97 105  BILITOT 20.0* 19.2* 18.5* 18.3* 17.8*  PROT 5.0* 5.0* 5.4* 5.3* 5.1*  ALBUMIN 1.7* 1.7* 1.8* 1.8* 1.6*   No results for input(s): LIPASE, AMYLASE in the last 168 hours. Recent Labs  Lab 05/09/17 0502 05/10/17 0625 05/11/17 0456 05/12/17 0458 05/13/17 0424  AMMONIA 79* 86* 72* 48* 71*   Coagulation Profile: Recent Labs  Lab 05/10/17 0625  INR 2.42   Cardiac Enzymes: No results for input(s): CKTOTAL, CKMB, CKMBINDEX, TROPONINI in the last 168 hours. BNP (last 3 results) No results for input(s): PROBNP in the last 8760 hours. HbA1C: No results for input(s): HGBA1C in the last 72 hours. CBG: No results for input(s): GLUCAP in the last 168 hours. Lipid Profile: No results for input(s): CHOL, HDL, LDLCALC, TRIG, CHOLHDL, LDLDIRECT in the last 72 hours. Thyroid Function Tests: No results for input(s): TSH, T4TOTAL, FREET4, T3FREE, THYROIDAB in the last 72 hours. Anemia Panel: No results for input(s): VITAMINB12, FOLATE, FERRITIN, TIBC, IRON, RETICCTPCT in the last 72 hours. Sepsis Labs: No results for input(s): PROCALCITON, LATICACIDVEN in the last 168 hours.  No results found for this or any previous visit (from the past 240 hour(s)).   Radiology Studies: No results found.  Scheduled  Meds: . aspirin EC  325 mg Oral Daily  . fluconazole  100 mg Oral Daily  . folic acid  1 mg Oral Daily  . heparin  5,000 Units Subcutaneous Q8H  . lactulose  30 g Oral TID  . lactulose  300 mL Rectal Once  .  liver oil-zinc oxide   Topical BID  . metoprolol tartrate  12.5 mg Oral TID  . mirtazapine  15 mg Oral QHS  . multivitamin with minerals  1 tablet Oral Daily  . nystatin ointment   Topical BID  . pantoprazole  40 mg Oral QAC breakfast  . prednisoLONE  40 mg Oral Daily  . rifaximin  550 mg Oral BID  . sodium chloride flush  3 mL Intravenous Q12H  . thiamine  100 mg Oral Daily   Or  . thiamine  100 mg Intravenous Daily   Continuous Infusions:   LOS: 10 days   Merlene Laughter, DO [facility] Pager [phone]  If 7PM-7AM, please contact night-coverage [email or web address] Password [facility] 05/13/2017, 6:40 PM

## 2017-05-13 NOTE — Telephone Encounter (Signed)
opened in error

## 2017-05-13 NOTE — Progress Notes (Addendum)
Progress Note  Patient Name: Gary [name] Date of Encounter: 05/13/2017  Primary Cardiologist: Dr Gary [name]   Subjective   Sitting up in bed, eating breakfast. Looks a little better than yesterday, a little stronger.   Inpatient Medications    Scheduled Meds: . aspirin EC  325 mg Oral Daily  . fluconazole  100 mg Oral Daily  . folic acid  1 mg Oral Daily  . heparin  5,000 Units Subcutaneous Q8H  . lactulose  30 g Oral TID  . lactulose  300 mL Rectal Once  . liver oil-zinc oxide   Topical BID  . metoprolol tartrate  12.5 mg Oral TID  . mirtazapine  15 mg Oral QHS  . multivitamin with minerals  1 tablet Oral Daily  . nystatin ointment   Topical BID  . pantoprazole  40 mg Oral QAC breakfast  . prednisoLONE  40 mg Oral Daily  . rifaximin  550 mg Oral BID  . sodium chloride flush  3 mL Intravenous Q12H  . thiamine  100 mg Oral Daily   Or  . thiamine  100 mg Intravenous Daily   Continuous Infusions:  PRN Meds: naphazoline-glycerin, ondansetron **OR** ondansetron (ZOFRAN) IV   Vital Signs    Vitals:   05/12/17 0508 05/12/17 1500 05/12/17 2123 05/13/17 0500  BP:  96/73 112/71 113/73  Pulse: (!) 118 (!) 117 (!) 120 (!) 112  Resp: 18 20 20 20   Temp: 99 F (37.2 C) 97.9 F (36.6 C) 98.5 F (36.9 C) 97.7 F (36.5 C)  TempSrc: Oral Oral Oral Oral  SpO2: 95% 95% 95% 95%  Weight:      Height:        Intake/Output Summary (Last 24 hours) at 05/13/2017 0756 Last data filed at 05/13/2017 0600 Gross per 24 hour  Intake 1203 ml  Output -  Net 1203 ml   Filed Weights   05/03/17 1532 05/04/17 0359  Weight: 228 lb 2.8 oz (103.5 kg) 231 lb 0.7 oz (104.8 kg)    Telemetry    NSR with runs of atrial tachycardia - Personally Reviewed  ECG     Physical Exam   GEN: No acute distress.  Tremulous, jaundiced Neck: No JVD Cardiac: RRR, no murmurs, rubs, or gallops.  Respiratory: decreased breath sounds MS: No edema; No deformity. Neuro:  Nonfocal  Psych: Normal affect     Labs    Chemistry Recent Labs  Lab 05/11/17 0456 05/12/17 0458 05/13/17 0424  NA 129* 129* 130*  K 4.0 3.6 4.0  CL 99* 101 102  CO2 22 22 21*  GLUCOSE 146* 101* 127*  BUN 30* 27* 26*  CREATININE 0.84 0.88 0.83  CALCIUM 8.1* 8.2* 8.2*  PROT 5.4* 5.3* 5.1*  ALBUMIN 1.8* 1.8* 1.6*  AST 121* 118* 121*  ALT 72* 77* 79*  ALKPHOS 100 97 105  BILITOT 18.5* 18.3* 17.8*  GFRNONAA >60 >60 >60  GFRAA >60 >60 >60  ANIONGAP 8 6 7      Hematology Recent Labs  Lab 05/08/17 0444  WBC 9.7  RBC 2.85*  HGB 11.1*  HCT 30.4*  MCV 106.7*  MCH 38.9*  MCHC 36.5*  RDW 18.6*  PLT 114*    Cardiac EnzymesNo results for input(s): TROPONINI in the last 168 hours. No results for input(s): TROPIPOC in the last 168 hours.   BNPNo results for input(s): BNP, PROBNP in the last 168 hours.   DDimer No results for input(s): DDIMER in the last 168 hours.   Radiology  No results found.  Cardiac Studies   Echo 09/19/16- Study Conclusions  - Procedure narrative: Transthoracic echocardiography. Image   quality was adequate. The study was technically difficult.   Intravenous contrast (Definity) was administered. - Left ventricle: The cavity size was normal. Wall thickness was   normal. Systolic function was vigorous. The estimated ejection   fraction was in the range of 65% to 70%. Wall motion was normal;   there were no regional wall motion abnormalities. Doppler   parameters are consistent with abnormal left ventricular   relaxation (grade 1 diastolic dysfunction).  Patient Profile     67 y.o. male with a history of alcoholism with cirrhosis. He was seen by Dr Gary [name] in 2017 for cardiomyopathy- EF then was 45%.  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 He was last seen by Dr Gary [name] July 2018 for pre op (hip) clearance. Echo done July 2018 showed normal LVF. He was admitted 05/03/17 with alcoholic hepatitis. Cardiology was consulted for PAT  which has been documented in the past. The pt is asymptomatic with this. Beta blocker adjusted this admission.   Assessment & Plan    Atrial tachycardia/ ectopic atrial rhythm Chronic. Some improvement in rate noted on telemetry with change from Inderal to Lopressor  NICM- EF 40-45% 2017, 65-70% July 2018. Myoview low risk 2017  Decompensated alcoholic cirrhosis Pt has chronically low platelets and high INR from liver disease  Plan: He tells me he will going to SNF possibly today. We will sign off.    For questions or updates, please contact [facility] Please consult [email or web address] for contact info under Cardiology/STEMI.      Signed, Gary Shelter, PA-C  05/13/2017, 7:56 AM    History and all data above reviewed.  Patient examined.  I agree with the findings as above.   He says that he is less weak than he was on admission.  No pain.  The patient exam reveals COR:RRR  ,  Lungs: clear  ,  Abd: Positive bowel sounds, no rebound no guarding, Ext Moderate edema  .  All available labs, radiology testing, previous records reviewed. Agree with documented assessment and plan. Atrial tachycardia:  No change in therapy.  He received 2 of his 3 doses of beta blocker yesterday.  Continue with new beta blocker and dosing as listed.  No other therapy for this arrhythmia.  Call us with any further questions.    Gary [name]  11:09 AM  05/13/2017

## 2017-05-13 NOTE — Plan of Care (Signed)
Patient stable during 7 a to 7 p shift, son in to visit.  Patient remains with flat affect, tearful at times.  Multiple stools this shift, able to make it to the bathroom for some, incontinent for others.  Awaiting placement.

## 2017-05-14 LAB — CBC WITH DIFFERENTIAL/PLATELET
BASOS PCT: 0 %
Basophils Absolute: 0 10*3/uL (ref 0.0–0.1)
Eosinophils Absolute: 0 10*3/uL (ref 0.0–0.7)
Eosinophils Relative: 0 %
HEMATOCRIT: 29.7 % — AB (ref 39.0–52.0)
HEMOGLOBIN: 10.8 g/dL — AB (ref 13.0–17.0)
LYMPHS ABS: 0.9 10*3/uL (ref 0.7–4.0)
Lymphocytes Relative: 5 %
MCH: 38.7 pg — AB (ref 26.0–34.0)
MCHC: 36.4 g/dL — AB (ref 30.0–36.0)
MCV: 106.5 fL — ABNORMAL HIGH (ref 78.0–100.0)
MONOS PCT: 12 %
Monocytes Absolute: 2.1 10*3/uL — ABNORMAL HIGH (ref 0.1–1.0)
NEUTROS ABS: 14.9 10*3/uL — AB (ref 1.7–7.7)
NEUTROS PCT: 83 %
Platelets: 107 10*3/uL — ABNORMAL LOW (ref 150–400)
RBC: 2.79 MIL/uL — ABNORMAL LOW (ref 4.22–5.81)
RDW: 17.5 % — ABNORMAL HIGH (ref 11.5–15.5)
WBC: 17.9 10*3/uL — ABNORMAL HIGH (ref 4.0–10.5)

## 2017-05-14 LAB — COMPREHENSIVE METABOLIC PANEL
ALBUMIN: 1.7 g/dL — AB (ref 3.5–5.0)
ALK PHOS: 112 U/L (ref 38–126)
ALT: 84 U/L — AB (ref 17–63)
ANION GAP: 7 (ref 5–15)
AST: 115 U/L — ABNORMAL HIGH (ref 15–41)
BILIRUBIN TOTAL: 16.7 mg/dL — AB (ref 0.3–1.2)
BUN: 23 mg/dL — ABNORMAL HIGH (ref 6–20)
CALCIUM: 8.2 mg/dL — AB (ref 8.9–10.3)
CO2: 21 mmol/L — ABNORMAL LOW (ref 22–32)
CREATININE: 0.8 mg/dL (ref 0.61–1.24)
Chloride: 101 mmol/L (ref 101–111)
GFR calc Af Amer: >60 mL/min (ref >60)
GFR calc non Af Amer: >60 mL/min (ref >60)
GLUCOSE: 121 mg/dL — AB (ref 65–99)
Potassium: 4 mmol/L (ref 3.5–5.1)
Sodium: 129 mmol/L — ABNORMAL LOW (ref 135–145)
TOTAL PROTEIN: 5.1 g/dL — AB (ref 6.5–8.1)

## 2017-05-14 LAB — PHOSPHORUS: Phosphorus: 2.8 mg/dL (ref 2.5–4.6)

## 2017-05-14 LAB — MAGNESIUM: Magnesium: 2.2 mg/dL (ref 1.7–2.4)

## 2017-05-14 LAB — AMMONIA: Ammonia: 73 umol/L — ABNORMAL HIGH (ref 9–35)

## 2017-05-14 MED ORDER — METOPROLOL TARTRATE 25 MG PO TABS: 12.5000 mg | ORAL_TABLET | Freq: Three times a day (TID) | ORAL | Status: AC

## 2017-05-14 MED ORDER — SPIRONOLACTONE 50 MG PO TABS: 25.0000 mg | ORAL_TABLET | Freq: Every day | ORAL | Status: DC

## 2017-05-14 MED ORDER — ZINC OXIDE 40 % EX OINT: TOPICAL_OINTMENT | Freq: Two times a day (BID) | CUTANEOUS | 0 refills | Status: DC

## 2017-05-14 MED ORDER — THIAMINE HCL 100 MG PO TABS: 100.0000 mg | ORAL_TABLET | Freq: Every day | ORAL | Status: DC

## 2017-05-14 MED ORDER — NYSTATIN 100000 UNIT/GM EX OINT: TOPICAL_OINTMENT | Freq: Two times a day (BID) | CUTANEOUS | 0 refills | Status: DC

## 2017-05-14 MED ORDER — RIFAXIMIN 550 MG PO TABS: 550.0000 mg | ORAL_TABLET | Freq: Two times a day (BID) | ORAL | Status: AC

## 2017-05-14 MED ORDER — MIRTAZAPINE 15 MG PO TABS: 15.0000 mg | ORAL_TABLET | Freq: Every day | ORAL | Status: DC

## 2017-05-14 MED ORDER — ZOLPIDEM TARTRATE 5 MG PO TABS
5.0000 mg | ORAL_TABLET | Freq: Once | ORAL | Status: AC
  Administered 2017-05-14: 5 mg via ORAL
  Filled 2017-05-14: qty 1

## 2017-05-14 MED ORDER — PREDNISOLONE 5 MG PO TABS: 40.0000 mg | ORAL_TABLET | Freq: Every day | ORAL | 0 refills | Status: AC

## 2017-05-14 MED ORDER — FUROSEMIDE 40 MG PO TABS: 20.0000 mg | ORAL_TABLET | Freq: Every day | ORAL | Status: DC

## 2017-05-14 MED ORDER — LACTULOSE 10 GM/15ML PO SOLN: 30.0000 g | Freq: Three times a day (TID) | ORAL | 0 refills | Status: AC

## 2017-05-14 NOTE — Progress Notes (Signed)
Physical Therapy Treatment Patient Details Name: Gary [name] MRN: [id number] DOB: 1950-09-10 Today's Date: 05/14/2017    History of Present Illness 67 y.o. male past medical history of arthritis, hypertension, reflux, alcohol abuse, lower extremity edema, heart failure presents to the emergency room with chief complaint of weakness and jaundice and admitted for Hyperbilirubinemia    PT Comments    Improved activity tolerance today, pt ambulated 45' with RW with improved balance. Ambulation distance limited by fatigue. Mod assist for bed mobility. SNF recommended.   Follow Up Recommendations  SNF     Equipment Recommendations  None recommended by PT    Recommendations for Other Services       Precautions / Restrictions Precautions Precautions: Fall Restrictions Weight Bearing Restrictions: No    Mobility  Bed Mobility Overal bed mobility: Needs Assistance Bed Mobility: Supine to Sit     Supine to sit: Mod assist     General bed mobility comments: Assist for trunk and bil LEs. Increased time. Utilized bedpad for scooting, positioning. Increased time.  Transfers Overall transfer level: Needs assistance Equipment used: Rolling walker (2 wheeled) Transfers: Sit to/from Stand Sit to Stand: Min assist         General transfer comment: Assist to rise, stabilize. Increased time.     Ambulation/Gait Ambulation/Gait assistance: Min assist Ambulation Distance (Feet): 45 Feet Assistive device: Rolling walker (2 wheeled) Gait Pattern/deviations: Step-through pattern Gait velocity: decreased   General Gait Details: distance limited by fatigue, no loss of balance   Stairs            Wheelchair Mobility    Modified Rankin (Stroke Patients Only)       Balance Overall balance assessment: Needs assistance   Sitting balance-Leahy Scale: Fair     Standing balance support: Bilateral upper extremity supported Standing balance-Leahy Scale: Poor Standing  balance comment: relies on BUE support                            Cognition Arousal/Alertness: Awake/alert Behavior During Therapy: Flat affect Overall Cognitive Status: No family/caregiver present to determine baseline cognitive functioning                                 General Comments: increased time to respond but A & O      Exercises      General Comments        Pertinent Vitals/Pain Pain Assessment: No/denies pain    Home Living                      Prior Function            PT Goals (current goals can now be found in the care plan section) Acute Rehab PT Goals PT Goal Formulation: With patient Time For Goal Achievement: 05/18/17 Potential to Achieve Goals: Fair Progress towards PT goals: Progressing toward goals    Frequency    Min 3X/week      PT Plan Current plan remains appropriate    Co-evaluation              AM-PAC PT "6 Clicks" Daily Activity  Outcome Measure  Difficulty turning over in bed (including adjusting bedclothes, sheets and blankets)?: Unable Difficulty moving from lying on back to sitting on the side of the bed? : Unable Difficulty sitting down on and standing up from a chair  with arms (e.g., wheelchair, bedside commode, etc,.)?: Unable Help needed moving to and from a bed to chair (including a wheelchair)?: A Lot Help needed walking in hospital room?: A Little Help needed climbing 3-5 steps with a railing? : Total 6 Click Score: 9    End of Session Equipment Utilized During Treatment: Gait belt Activity Tolerance: Patient limited by fatigue;Patient tolerated treatment well Patient left: with call bell/phone within reach;in chair;with chair alarm set Nurse Communication: Mobility status PT Visit Diagnosis: Unsteadiness on feet (R26.81);Difficulty in walking, not elsewhere classified (R26.2)     Time: 2703-5009 PT Time Calculation (min) (ACUTE ONLY): 17 min  Charges:  $Gait  Training: 8-22 mins                    G Codes:          Tamala Ser 05/14/2017, 12:30 PM [phone]

## 2017-05-14 NOTE — Discharge Summary (Addendum)
Physician Discharge Summary  Gary [name]:[id number] DOB: Nov 05, 1950 DOA: 05/03/2017  PCP: [name], NP  Admit date: 05/03/2017 Discharge date: 05/14/2017  Admitted From: Home Disposition: SNF however patient decided he would go home as bed was not available so he signed out Against Medical Advice as it was felt that he would not be a safe D/C Home.   Recommendations for Outpatient Follow-up:  1. Follow up with PCP in 1-2 weeks 2. Follow up with Gastroenterology as an outpatient 3. Follow up with Cardiology as an outpatient  4. Palliative Care to Follow at SNF 5. Please obtain CMP/CBC, Mag, Phos in one week 6. Please follow up on the following pending results:  Home Health: No Equipment/Devices: None   Discharge Condition: Stable  CODE STATUS: FULL CODE  Diet recommendation: Heart Healthy Diet   Brief/Interim Summary: Gary [name] a 67 y.o.malepast medical history of arthritis, hypertension, reflux, alcohol abuse, lower extremity edema, heart failure presents to the emergency room with chief complaint of weakness and jaundice. Patient states that he has been feeling weaker over the last 4 weeks. He also had several weeks of red painful scrotum and red irritated gluteal cleft. History of chronic alcohol abuse.   Patient was found to have elevated bilirubin, INR and LFTs on presentation. GI has beenfollowing. Currently he is being treated for decompensated alcohol liver cirrhosis andalcohol hepatitis and was started on Prednisolone.  Due to his poor prognosis, palliative care was also involved in the management.Patient wants to remain as full code. Physical therapy evaluatedthe patient and recommended skilled nursing facility on discharge. Currently he is waiting for skilled nursing facility bed.  Cardiology was consulted 05/12/17 due to Atrial Tachycardia. Cardiology adjusted his medications by changing to Metoprolol with improvement.   Patient has improved since  being hospitalized. His Lasix and Spironolactone are still being held due to Na+ not being >132. Will need to monitor Sodium closely as an outpatient and resume them at lower doses as below. He was deemed medically stable to D/C to SNF and will need to follow up with Gastroenterology very closely as an outpatient.   **Addendum: Bed was not available today due to Insurance Acceptance so patient decided he wanted to go home instead. It was felt that he was not safe to Discharge home given his weakness and Recent Alcoholism and it was recommended that he wait until Insurance Authorization was obtained. Per Social Worker SNF can admit the patient from Home once Obtaining Inusrance Authorization and he preferred that plan so he proceeded to sign out Against Medical Advice as it was felt that Home would not be a safe discharge disposition.   Discharge Diagnoses:  Principal Problem:   Hyperbilirubinemia Active Problems:   Hyponatremia   AKI (acute kidney injury) (HCC)   Jaundice   Encephalopathy, hepatic (HCC)   Junctional rhythm   Pressure injury of skin  Acute Decompensated Alcoholic Liver Cirrhosis: -Maddrey Discriminant Function Score was >60;  -Started on Prednisolone, Day 10.Plan is to continue 30-day course of prednisolone at 40 mg and taper over 2 weeks. -He was onFurosemide 40 mg ,Spironolactone 50 daily.They have been held because of hyponatremia and increasing BUN. GI recommends to hold diuretics until sodium is greater than 132 and BUN is less than 25. When restarting, Lasix 20 mg and Aldactone 25 mg daily should be ordered. -Underwent Paracentesisonceto exclude SBP; Cytology showed just inflammatory cells. -WBC in Peritoneal Fluid was 181 -Counseled on EtOH Abusecessation. -He has very poor prognosis. Given  current DF score 30-day mortality risk isup to 50%. -US abdomen on 05/05/17 showed: Hepatic cirrhosis with stable left hepatic cyst measuring approximately 1.4 x 1.1 x  1.5 cm, Small volume of ascites GI planning for repeating EGD to rule out varices in near future. -He will follow-up with GI as an outpatient -Given his poor prognosis,palliative care is also following.But patientwants toremains as full code.Palliative Care to follow at D/C -PT/OT Consulted and recommending SNF  AtrialTtachycardia, improved  -Cardiology consulted  -Changed propranolol to low-dose Metoprolol 12.5 mg po 3 times a day with improvement -C/w  -C/w Telemetry for now  -C/w ASA 325 mg po Daily   Alcoholic Hepatitis:  -Liver enzymes appear stable now and AST is 115 and ALT is 84.  -GI was following, started Prednisolone 40 mg po Daily with Taper. -As Above  Hyperammonemia -Ammonia Level was 73 this AM -Continue oral lactulose30 cc TID, Rifaximin 550 BID.  -Mental status has not deteriorated and he is currently alert and oriented.Goal will be 3-4 bowel movements per day. Remains significantly weak.  Hyperbilirubinemia -Improving; T Bili now 16.8 -C/w Treatment as above -Repeat CMP in AM   Hyponatremia: -Na+ went from 127 -> 130 -> 129 -We will continue to monitor. -Diuretics currently on hold until Na+ >132 and BUN <25   Lactic acidosis -Resolved  Scrotal fungal skin infection and Buttock lesion:  -Continue Nystatin, Desitin, and scrotal support. -Fluconazole stopped today   Hypertension:  -BP stable. Also onMetoprolol 12.5 mg po TID  Generalized Weakness and Deconditioning  -Physical therapy evaluate the patient and recommended skilled nursing facility. SW on board and appreciate assistance with placing patient   Acute kidney injury:  -Kidney function has improved and BUN/Cr now 23/0.80 -Repeat CMP in AM   GERD -Continue PPI with Protonix 40 mg po Daily   Anemia/Thrombocytopenia/elevated INR: -Secondary to end stage liver disease. Relatively Stable  -Hb/Hct was 10.8/29.7, Platelet Count was 107, and INR was 2.42 last time it was  checked -Continue Folic acid and Thiamine  Depressed mood -C/w Mirtazapine 15 mg qHS  Discharge Instructions Discharge Instructions    Call MD for:  difficulty breathing, headache or visual disturbances   Complete by:  As directed    Call MD for:  extreme fatigue   Complete by:  As directed    Call MD for:  hives   Complete by:  As directed    Call MD for:  persistant dizziness or light-headedness   Complete by:  As directed    Call MD for:  persistant nausea and vomiting   Complete by:  As directed    Call MD for:  redness, tenderness, or signs of infection (pain, swelling, redness, odor or green/yellow discharge around incision site)   Complete by:  As directed    Call MD for:  severe uncontrolled pain   Complete by:  As directed    Call MD for:  temperature >100.4   Complete by:  As directed    Diet - low sodium heart healthy   Complete by:  As directed    Discharge instructions   Complete by:  As directed    Follow up with PCP, Gastroenterology, and Cardiology at D/C. Take all medications as prescribed and avoid Alcohol. If symptoms change or worsen please return to the ED for evaluation.   Increase activity slowly   Complete by:  As directed      Allergies as of 05/14/2017      Reactions   Ace  Inhibitors Other (See Comments)   Cough with lisinopril      Medication List    STOP taking these medications   oxyCODONE-acetaminophen 5-325 MG tablet Commonly known as:  ROXICET   propranolol 10 MG tablet Commonly known as:  INDERAL     TAKE these medications   aspirin EC 325 MG tablet Take 1 tablet (325 mg total) by mouth 2 (two) times daily. What changed:  when to take this   Fish Oil 500 MG Caps Take 500 mg by mouth daily.   folic acid 1 MG tablet Commonly known as:  FOLVITE Take 1 mg by mouth daily.   furosemide 40 MG tablet Commonly known as:  LASIX Take 0.5 tablets (20 mg total) by mouth daily. Resume when Sodium is >132 Start taking on:   05/17/2017 What changed:    how much to take  additional instructions  These instructions start on 05/17/2017. If you are unsure what to do until then, ask your doctor or other care provider.   hydroxypropyl methylcellulose / hypromellose 2.5 % ophthalmic solution Commonly known as:  ISOPTO TEARS / GONIOVISC Place 1 drop into both eyes 4 (four) times daily as needed for dry eyes.   lactulose 10 GM/15ML solution Commonly known as:  CHRONULAC Take 45 mLs (30 g total) by mouth 3 (three) times daily.   liver oil-zinc oxide 40 % ointment Commonly known as:  DESITIN Apply topically 2 (two) times daily.   metoprolol tartrate 25 MG tablet Commonly known as:  LOPRESSOR Take 0.5 tablets (12.5 mg total) by mouth 3 (three) times daily.   mirtazapine 15 MG tablet Commonly known as:  REMERON Take 1 tablet (15 mg total) by mouth at bedtime.   multivitamin with minerals Tabs tablet Take 1 tablet by mouth daily.   nystatin ointment Commonly known as:  MYCOSTATIN Apply topically 2 (two) times daily.   pantoprazole 40 MG tablet Commonly known as:  PROTONIX Take 1 tablet (40 mg total) by mouth daily. What changed:  when to take this   prednisoLONE 5 MG Tabs tablet Take 8 tablets (40 mg total) by mouth daily. Start taking on:  05/15/2017   rifaximin 550 MG Tabs tablet Commonly known as:  XIFAXAN Take 1 tablet (550 mg total) by mouth 2 (two) times daily.   spironolactone 50 MG tablet Commonly known as:  ALDACTONE Take 0.5 tablets (25 mg total) by mouth daily. Resume when Sodium is >132. Start taking on:  05/17/2017 What changed:    how much to take  additional instructions  These instructions start on 05/17/2017. If you are unsure what to do until then, ask your doctor or other care provider.   thiamine 100 MG tablet Take 1 tablet (100 mg total) by mouth daily. Start taking on:  05/15/2017   tiZANidine 2 MG tablet Commonly known as:  ZANAFLEX Take 1 tablet (2 mg total) by mouth  every 6 (six) hours as needed for muscle spasms.   triamcinolone ointment 0.1 % Commonly known as:  KENALOG Apply 1 application topically 2 (two) times daily.       Contact information for follow-up providers    [facility] Follow up.   Specialty:  Home Health Services Why:  [facility] will call 24 to 48 hours after discharged to make appointments. Contact information: [address] [place] [address] [phone]        Napoleon Form, MD. Schedule an appointment as soon as possible for a visit  in 3 week(s).   Specialty:  Gastroenterology Contact information: [address] [place] [address] [phone]            Contact information for after-discharge care    Destination    [facility] Follow up.   Service:  Skilled Nursing Contact information: [address] [place] [address] [phone]                 Allergies  Allergen Reactions  . Ace Inhibitors Other (See Comments)    Cough with lisinopril   Consultations:  Gastroenterology  Cardiology  Procedures/Studies: Dg Chest 2 View  Result Date: 05/03/2017 CLINICAL DATA:  Shortness of breath, nonproductive cough EXAM: CHEST  2 VIEW COMPARISON:  09/11/2016 FINDINGS: Lungs are clear.  No pleural effusion or pneumothorax. The heart is normal in size. Visualized osseous structures are within normal limits. IMPRESSION: Normal chest radiographs. Electronically Signed   By: Charline Bills M.D.   On: 05/03/2017 09:51   Dg Pelvis 1-2 Views  Result Date: 05/03/2017 CLINICAL DATA:  Patient fell out of his chair last night. Recent right hip replacement. Complaining of pain. Right knee and ankle swelling. EXAM: PELVIS - 1-2 VIEW COMPARISON:  09/22/2016 FINDINGS: No fracture.  No bone lesion. Right total hip arthroplasty is well-seated and aligned. Left hip joint, SI joints and symphysis pubis are normally aligned. Soft tissues are unremarkable.  IMPRESSION: 1. No fracture or dislocation. 2. No evidence of loosening of the right hip arthroplasty. Electronically Signed   By: Amie Portland M.D.   On: 05/03/2017 09:51   US Abdomen Complete  Result Date: 05/05/2017 CLINICAL DATA:  Cirrhosis, hyperbilirubinemia, acute renal injury and jaundice. EXAM: ABDOMEN ULTRASOUND COMPLETE COMPARISON:  Right upper quadrant ultrasound from 05/03/2017, CT 06/15/2015 FINDINGS: Gallbladder: No gallstones or wall thickening visualized. No sonographic Murphy sign noted by sonographer. Gallbladder wall is normal at 3 mm. Small amount of ascites adjacent to the gallbladder fundus. Biliary sludge noted previously is less apparent on current exam. Common bile duct: Diameter: 5.8 mm normal without choledocholithiasis. Liver: Coarse hepatic echotexture with nodular surface contours of the liver compatible with cirrhosis. Well-circumscribed 1.4 x 1.1 x 1.5 cm cyst is visualized and stable along the anterior aspect of the left hepatic lobe. IVC: No abnormality visualized. Pancreas: Limited visualization of the pancreas due to bowel gas. No focal abnormality is identified in the upper abdominal retroperitoneum. Spleen: Long thin appearing spleen measuring up to 14 cm in length, unchanged relative to prior CT from 2017. Right Kidney: Length: 11.1 cm. Echogenicity within normal limits. No mass or hydronephrosis visualized. Left Kidney: Length: 11.9 cm. Echogenicity within normal limits. No mass or hydronephrosis visualized. Abdominal aorta: The mid aorta is not aneurysmal measuring 1.8 cm. The upper and distal aorta are not well visualized. Other findings: Small volume of ascites noted outlining the liver and spleen. IMPRESSION: 1. Hepatic cirrhosis with stable left hepatic cyst measuring approximately 1.4 x 1.1 x 1.5 cm. 2. Small volume of ascites. 3. Unremarkable appearance of the gallbladder. Biliary sludge is less apparent on current exam. 4. Stable long, thin appearance of the spleen  measuring up to 14 cm. Electronically Signed   By: Tollie Eth M.D.   On: 05/05/2017 19:31   US Paracentesis  Result Date: 05/03/2017 INDICATION: History of cirrhosis, now with symptomatic ascites. Please perform ultrasound-guided paracentesis for infection source control purposes. EXAM: ULTRASOUND-GUIDED PARACENTESIS COMPARISON:  Right upper quadrant abdominal ultrasound - earlier same day MEDICATIONS: None. COMPLICATIONS: None  immediate. TECHNIQUE: Informed written consent was obtained from the patient after a discussion of the risks, benefits and alternatives to treatment. A timeout was performed prior to the initiation of the procedure. Initial ultrasound scanning demonstrates a small amount of ascites within the left lower abdominal quadrant. The left lower abdomen was prepped and draped in the usual sterile fashion. 1% lidocaine with epinephrine was used for local anesthesia. Under direct ultrasound guidance, a 19 gauge, 7-cm, Yueh catheter was introduced. An ultrasound image was saved for documentation purposed. The paracentesis was performed. The catheter was removed and a dressing was applied. The patient tolerated the procedure well without immediate post procedural complication. FINDINGS: A total of approximately 650 cc of serous fluid was removed. Samples were sent to the laboratory as requested by the clinical team. IMPRESSION: Successful ultrasound-guided paracentesis yielding 650 cc of peritoneal fluid. Electronically Signed   By: Simonne Come M.D.   On: 05/03/2017 14:45   US Abdominal Pelvic Art/vent Flow Doppler  Result Date: 05/05/2017 CLINICAL DATA:  Cirrhosis.  Jaundice.  Inpatient. EXAM: DUPLEX ULTRASOUND OF LIVER TECHNIQUE: Color and duplex Doppler ultrasound was performed to evaluate the hepatic in-flow and out-flow vessels. COMPARISON:  06/15/2015 CT abdomen. FINDINGS: Portal Vein Velocities Main:  21 cm/sec Main portal vein diameter 1.1 cm. Patent main portal vein with appropriate flow  direction. The right and left portal veins were unable to be visualized on this very limited scan limited by patient body habitus, bowel gas and difficulty breath holding. Hepatic Vein Velocities Left:  65 cm/sec The left hepatic vein is patent with appropriate flow direction. The middle and right hepatic veins could not be visualized. Hepatic Artery Velocity:  162 cm/sec Splenic Vein Velocity:  21 cm/sec Patent splenic vein with appropriate flow direction. Varices: Absent. Ascites: Small volume ascites. Spleen dimensions 10.7 x 4.4 x 12.2 cm (volume = 300 cm^3). Normal size spleen. IMPRESSION: Very limited scan, see comments. No acute hepatic vascular abnormality detected. CT abdomen/pelvis with IV contrast may be considered if there is continued clinical concern for acute hepatic vascular abnormality, given this scan's significant limitations. Small volume ascites. Electronically Signed   By: Delbert Phenix M.D.   On: 05/05/2017 20:06   US Abdomen Limited Ruq  Result Date: 05/03/2017 CLINICAL DATA:  Jaundice EXAM: ULTRASOUND ABDOMEN LIMITED RIGHT UPPER QUADRANT COMPARISON:  CT abdomen/pelvis dated 06/15/2015. FINDINGS: Gallbladder: Layering gallbladder sludge. No gallbladder wall thickening or pericholecystic fluid. Negative sonographic Murphy's sign. Common bile duct: Diameter: 5 mm Liver: Coarse hepatic echotexture with nodular hepatic contour, suggesting cirrhosis. 2.3 x 2.2 x 2.0 cm hepatic cyst. Portal vein is patent on color Doppler imaging with normal direction of blood flow towards the liver. Additional comments: Ascites. IMPRESSION: Cirrhosis.  2.3 cm hepatic cyst, benign. Ascites. Layering gallbladder sludge. Electronically Signed   By: Charline Bills M.D.   On: 05/03/2017 14:03    U/S Guided Paracentesis yielding 650 mL of Serous Ascitic Fluid   Subjective: Seen and examined and had a flat affect. No issues and stated he slept well overnight. No CP or SOB. Abdomen is not paining. Only real  complaint is buttock lesion. Wanting to go to SNF.  Discharge Exam: Vitals:   05/14/17 0355 05/14/17 1314  BP: 113/67 111/77  Pulse: 72 66  Resp: 16   Temp: 98.1 F (36.7 C) 97.6 F (36.4 C)  SpO2: 95% 99%   Vitals:   05/13/17 1614 05/13/17 2101 05/14/17 0355 05/14/17 1314  BP: 112/74 106/83 113/67 111/77  Pulse: 69 (!)  109 72 66  Resp:  16 16   Temp:  98.9 F (37.2 C) 98.1 F (36.7 C) 97.6 F (36.4 C)  TempSrc:  Oral Oral Oral  SpO2: 96% 96% 95% 99%  Weight:      Height:       General: Pt is alert, awake, not in acute distress; Severely jaundiced Cardiovascular: RRR, S1/S2 +, no rubs, no gallops Respiratory: Diminished bilaterally, no wheezing, no rhonchi Abdominal: Soft, NT, Distended to body habitus, bowel sounds + Extremities: 2+ LE edema, no cyanosis; Has some scrotal erythema  The results of significant diagnostics from this hospitalization (including imaging, microbiology, ancillary and laboratory) are listed below for reference.    Microbiology: No results found for this or any previous visit (from the past 240 hour(s)).   Labs: BNP (last 3 results) No results for input(s): BNP in the last 8760 hours. Basic Metabolic Panel: Recent Labs  Lab 05/10/17 0625 05/11/17 0456 05/12/17 0458 05/13/17 0424 05/14/17 0410  NA 126* 129* 129* 130* 129*  K 4.0 4.0 3.6 4.0 4.0  CL 98* 99* 101 102 101  CO2 22 22 22  21* 21*  GLUCOSE 119* 146* 101* 127* 121*  BUN 33* 30* 27* 26* 23*  CREATININE 1.07 0.84 0.88 0.83 0.80  CALCIUM 7.9* 8.1* 8.2* 8.2* 8.2*  MG  --   --   --  2.1 2.2  PHOS  --   --   --  3.2 2.8   Liver Function Tests: Recent Labs  Lab 05/10/17 0625 05/11/17 0456 05/12/17 0458 05/13/17 0424 05/14/17 0410  AST 112* 121* 118* 121* 115*  ALT 66* 72* 77* 79* 84*  ALKPHOS 97 100 97 105 112  BILITOT 19.2* 18.5* 18.3* 17.8* 16.7*  PROT 5.0* 5.4* 5.3* 5.1* 5.1*  ALBUMIN 1.7* 1.8* 1.8* 1.6* 1.7*   No results for input(s): LIPASE, AMYLASE in the last  168 hours. Recent Labs  Lab 05/10/17 0625 05/11/17 0456 05/12/17 0458 05/13/17 0424 05/14/17 0410  AMMONIA 86* 72* 48* 71* 73*   CBC: Recent Labs  Lab 05/08/17 0444 05/13/17 0424 05/14/17 0410  WBC 9.7 12.8* 17.9*  NEUTROABS 6.2 10.8* 14.9*  HGB 11.1* 10.9* 10.8*  HCT 30.4* 30.6* 29.7*  MCV 106.7* 107.4* 106.5*  PLT 114* 98* 107*   Cardiac Enzymes: No results for input(s): CKTOTAL, CKMB, CKMBINDEX, TROPONINI in the last 168 hours. BNP: Invalid input(s): POCBNP CBG: No results for input(s): GLUCAP in the last 168 hours. D-Dimer No results for input(s): DDIMER in the last 72 hours. Hgb A1c No results for input(s): HGBA1C in the last 72 hours. Lipid Profile No results for input(s): CHOL, HDL, LDLCALC, TRIG, CHOLHDL, LDLDIRECT in the last 72 hours. Thyroid function studies No results for input(s): TSH, T4TOTAL, T3FREE, THYROIDAB in the last 72 hours.  Invalid input(s): FREET3 Anemia work up No results for input(s): VITAMINB12, FOLATE, FERRITIN, TIBC, IRON, RETICCTPCT in the last 72 hours. Urinalysis    Component Value Date/Time   COLORURINE AMBER (A) 05/03/2017 2331   APPEARANCEUR CLEAR 05/03/2017 2331   LABSPEC 1.014 05/03/2017 2331   PHURINE 5.0 05/03/2017 2331   GLUCOSEU NEGATIVE 05/03/2017 2331   HGBUR NEGATIVE 05/03/2017 2331   BILIRUBINUR SMALL (A) 05/03/2017 2331   KETONESUR NEGATIVE 05/03/2017 2331   PROTEINUR NEGATIVE 05/03/2017 2331   UROBILINOGEN >8.0 (H) 11/10/2014 1548   NITRITE NEGATIVE 05/03/2017 2331   LEUKOCYTESUR NEGATIVE 05/03/2017 2331   Sepsis Labs Invalid input(s): PROCALCITONIN,  WBC,  LACTICIDVEN Microbiology No results found for this or any  previous visit (from the past 240 hour(s)).  Time coordinating discharge: 35 minutes  SIGNED:  Merlene Laughter, DO [facility] 05/14/2017, 2:39 PM Pager [phone]  If 7PM-7AM, please contact night-coverage [email or web address] Password [email or web address]

## 2017-05-14 NOTE — Progress Notes (Signed)
CSW following to assist with disposition. Pt has been referred to SNF and [facility] SNF selected 05/11/16. SNF has initiated [employer] but [facility] has not yet responded. Anticipate hearing back from representative tomorrow based on messages with insurance today. Will not accept LOG, and no other bed offers due to [employer] limiting options. Pt has been evaluated as medically stable for DC.  Discussed situation and options with pt, his sister, his wife, CM, and Education officer, museum. Pt states at this time he prefers to go home as "he cannot pay for the rehab out of pocket but can't afford a hospital bill either." CSW reiterated that SNF upon DC is hospital staff's recommendation. Pt states he understands but will have his son, sisters, wife, and neighbor help with his care. [facility] offered that pt can admit to SNF from home once obtaining insurance authorization (anticipates tomorrow). Pt states he prefers this plan. CSW provided pt with directions and contact information for [facility] and pt states his son will transport him there once he can admit.  [facility] pt's contact numbers (home and cell) to notify him when insurance authorization is obtained.  Pt states wife will transport him home tonight once she arrives at hospital. Updated attending and RN. Per attending pt will be asked to sign AMA paperwork as home is not recommended disposition.  Ilean Skill, MSW, LCSW Clinical Social Work 05/14/2017 [phone]

## 2017-05-14 NOTE — Plan of Care (Signed)
  Progressing Health Behavior/Discharge Planning: Ability to manage health-related needs will improve 05/14/2017 1544 - Progressing by Tzipora Mcinroy, Alben Spittle, RN Clinical Measurements: Ability to maintain clinical measurements within normal limits will improve 05/14/2017 1544 - Progressing by Rhina Kramme, Alben Spittle, RN Will remain free from infection 05/14/2017 1544 - Progressing by Teion Ballin, Alben Spittle, RN Diagnostic test results will improve 05/14/2017 1544 - Progressing by Jazon Jipson, Alben Spittle, RN Respiratory complications will improve 05/14/2017 1544 - Progressing by Sunni Richardson, Alben Spittle, RN Cardiovascular complication will be avoided 05/14/2017 1544 - Progressing by Pietrina Jagodzinski, Alben Spittle, RN Pain Managment: General experience of comfort will improve 05/14/2017 1544 - Progressing by Franci Oshana, Alben Spittle, RN Skin Integrity: Risk for impaired skin integrity will decrease 05/14/2017 1544 - Progressing by Jeriel Vivanco, Alben Spittle, RN

## 2017-05-15 IMAGING — US US ABDOMEN COMPLETE
1 series · 14 of 25 positions shown · non-contrast
Comparison: No priors.  CT of the abdomen and pelvis 06/15/2015.

CLINICAL DATA: 65-year-old male with hyperbilirubinemia. Alcohol
abuse. Acute hepatitis.

EXAM:
ABDOMEN ULTRASOUND COMPLETE

[Series 1: us abdomen complete · 0.25mm/px · 14 of 68 slices shown]
[im 1/68]
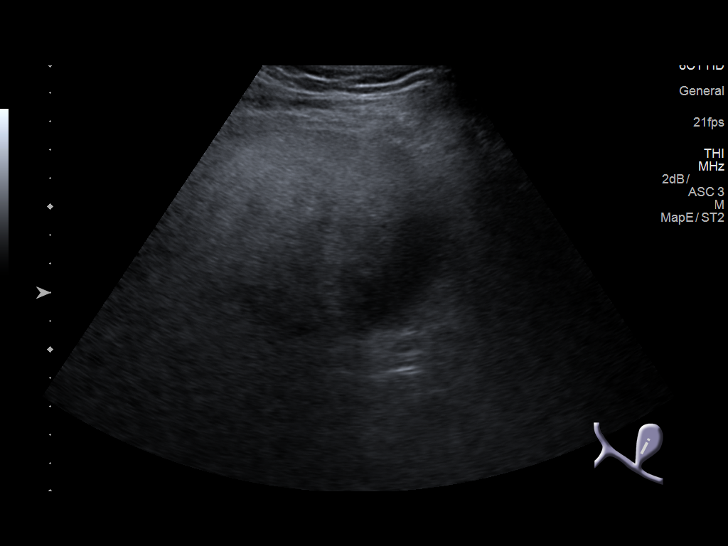
[im 6/68]
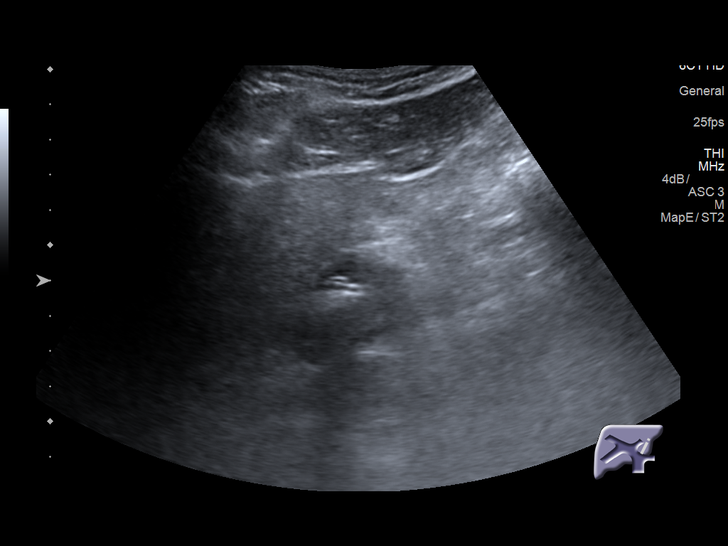
[im 12/68]
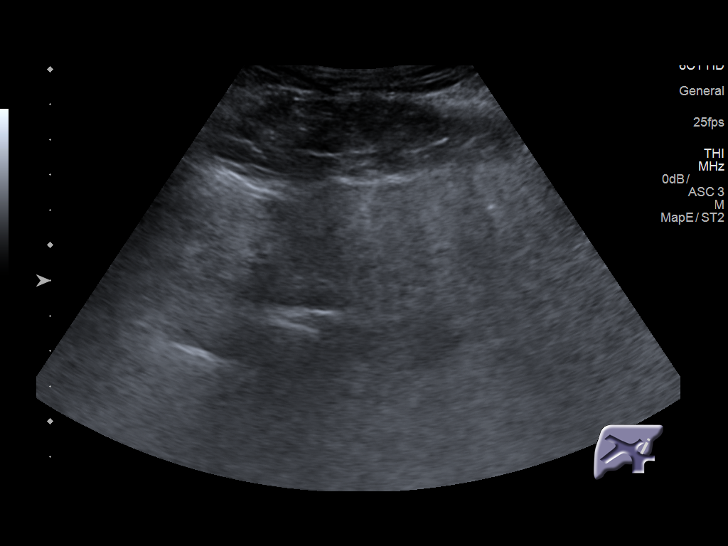
[im 17/68]
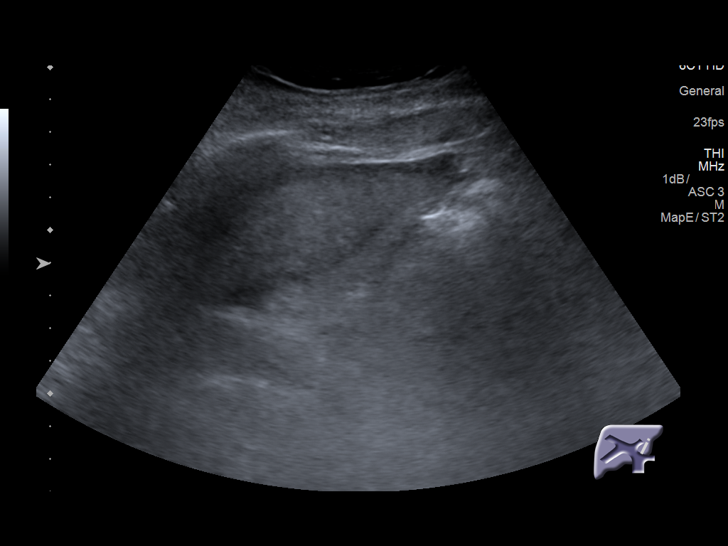
[im 23/68]
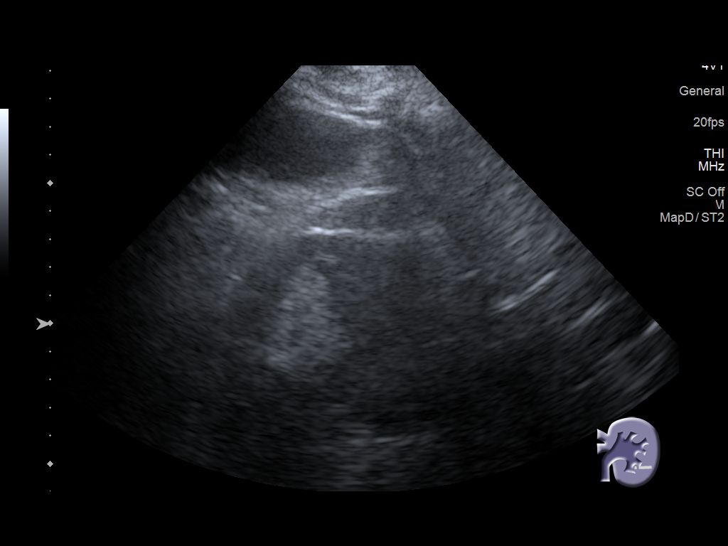
[im 26/68]
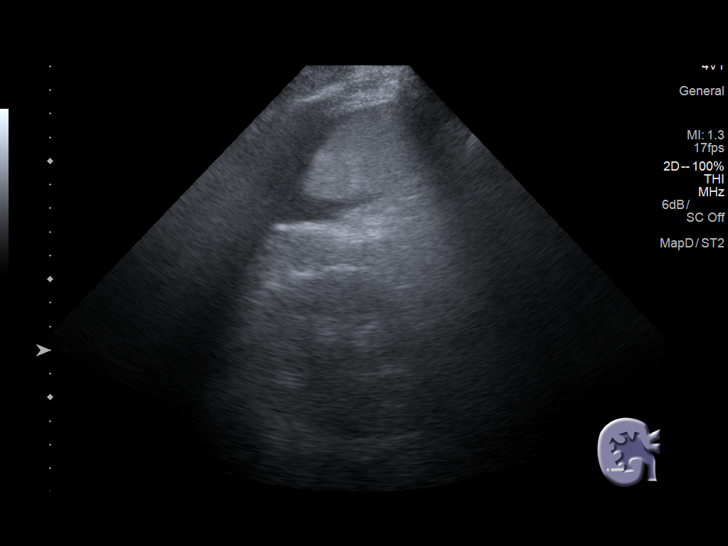
[im 31/68]
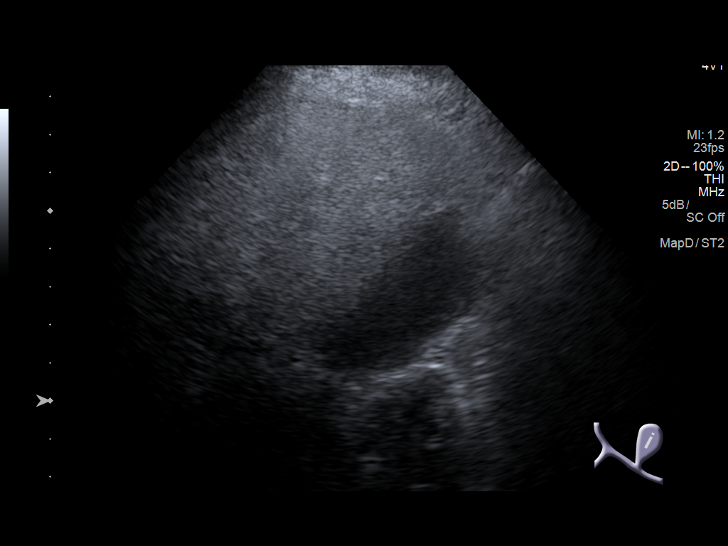
[im 37/68]
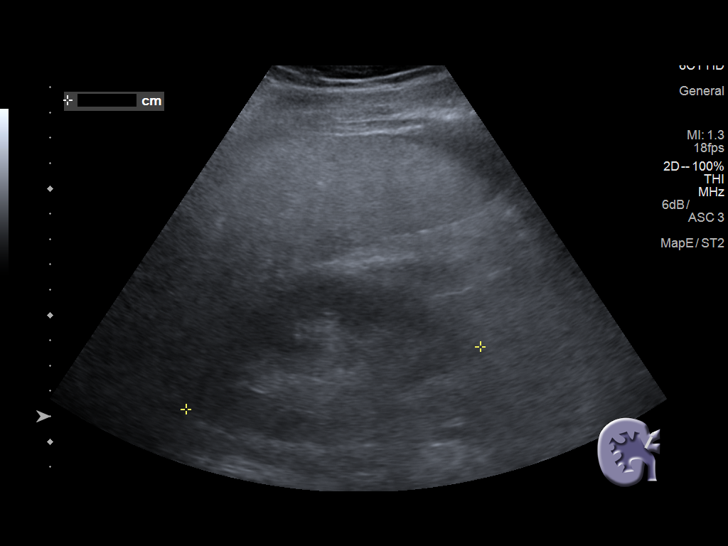
[im 42/68]
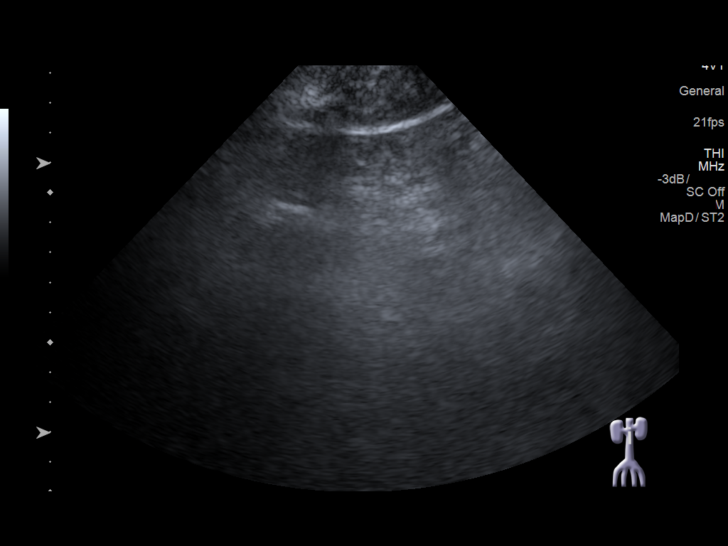
[im 45/68]
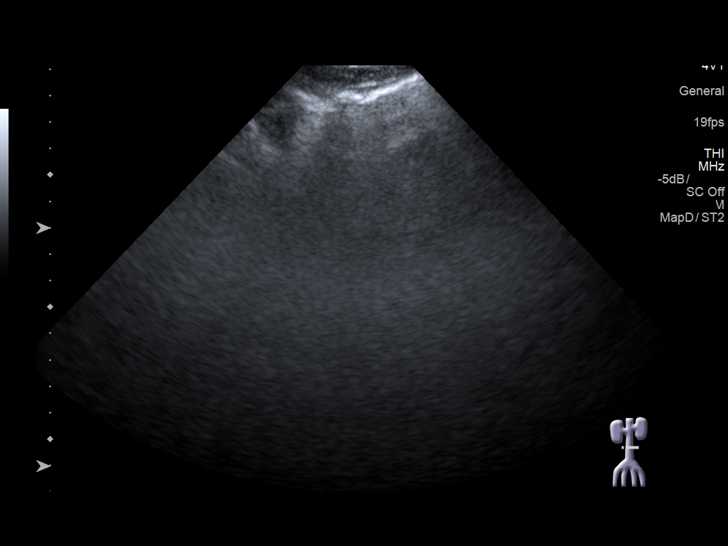
[im 51/68]
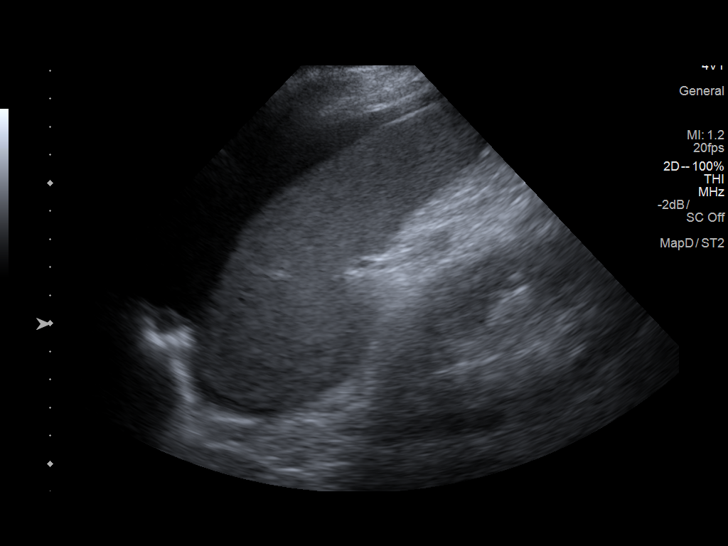
[im 56/68]
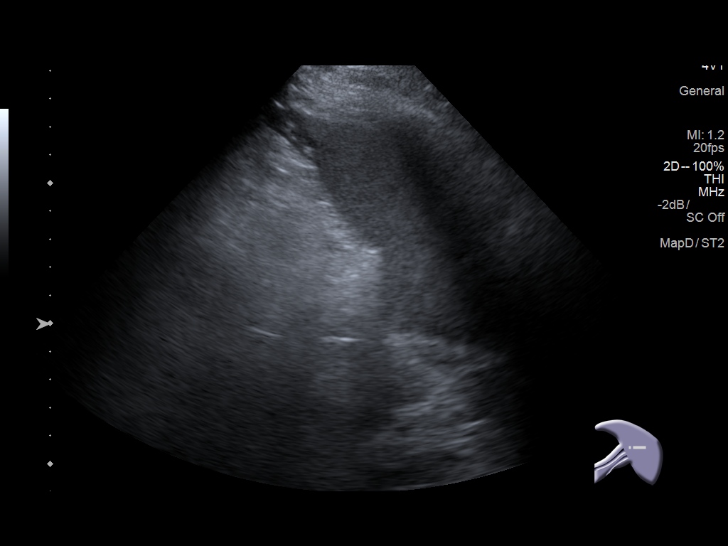
[im 62/68]
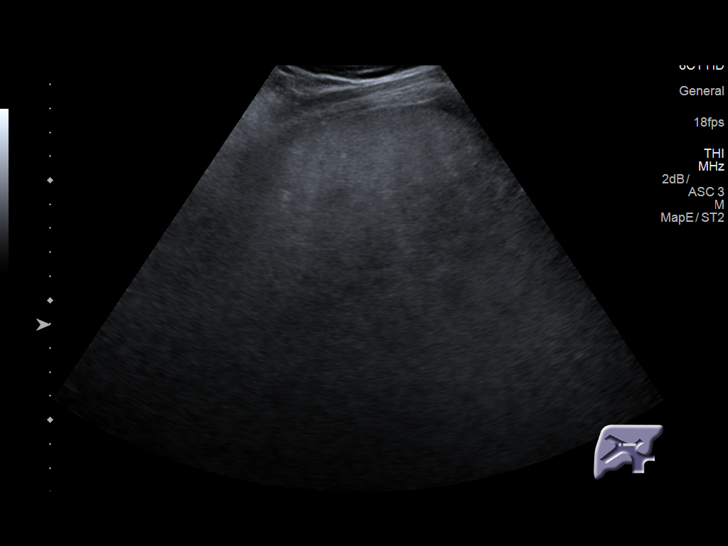
[im 68/68]
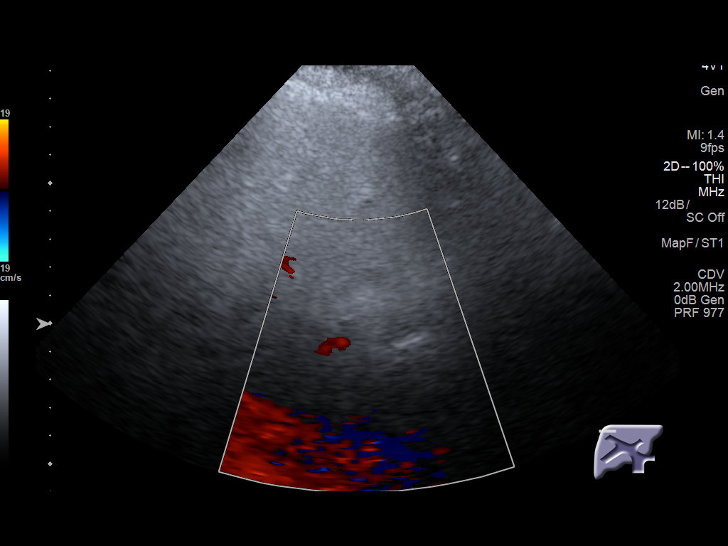

[14 of 25 positions shown; findings below may reference images not displayed]

FINDINGS: Gallbladder: No gallstones or wall thickening visualized. No
sonographic Murphy sign noted by sonographer.

Common bile duct:  Could not be visualized.

Liver: Heterogeneous in echotexture, which generally increased
echogenicity, compatible with severe hepatic steatosis. No discrete
mass confidently identified. No intrahepatic biliary ductal
dilatation noted.

IVC: No abnormality visualized.

Pancreas: Visualized portion unremarkable.

Spleen: Size and appearance within normal limits.  9.7 cm in length.

Right Kidney: Length: 11.9 cm. Echogenicity within normal limits. No
mass or hydronephrosis visualized.

Left Kidney: Length: 12.0 cm. Echogenicity within normal limits. No
mass or hydronephrosis visualized.

Abdominal aorta: No aneurysm visualized.

Other findings: Trace amount of perisplenic ascites.
IMPRESSION: 1. Severe hepatic steatosis.
2. Trace amount of perisplenic ascites.

## 2017-05-18 ENCOUNTER — Telehealth: Payer: Self-pay

## 2017-05-18 NOTE — Telephone Encounter (Signed)
Spoke with the spouse. Appointment scheduled.

## 2017-05-18 NOTE — Telephone Encounter (Signed)
-----   Message from Sammuel Cooper, PA-C sent at 05/13/2017  1:10 PM EDT ----- Regarding: office follow up Beth, pt has been in the hospital with severe EToH hepatitis, and decompensated cirrhosis - Nandigam saw him- he is going to nursing home for rehab for several weeks. Please get him an office follow up with Nandigam in 4-5 weeks , and call pt's wife with date and time. thanks

## 2017-05-23 ENCOUNTER — Emergency Department (HOSPITAL_COMMUNITY): Payer: Medicare HMO

## 2017-05-23 ENCOUNTER — Inpatient Hospital Stay (HOSPITAL_COMMUNITY)
Admission: EM | Admit: 2017-05-23 | Discharge: 2017-05-28 | DRG: 433 | Disposition: A | Discharge status: Hospice | Payer: Medicare HMO | Attending: Internal Medicine | Admitting: Internal Medicine

## 2017-05-23 ENCOUNTER — Encounter (HOSPITAL_COMMUNITY): Payer: Self-pay | Admitting: *Deleted

## 2017-05-23 DIAGNOSIS — R410 Disorientation, unspecified: Secondary | ICD-10-CM

## 2017-05-23 DIAGNOSIS — I4581 Long QT syndrome: Secondary | ICD-10-CM | POA: Diagnosis present

## 2017-05-23 DIAGNOSIS — N390 Urinary tract infection, site not specified: Secondary | ICD-10-CM | POA: Diagnosis present

## 2017-05-23 DIAGNOSIS — E871 Hypo-osmolality and hyponatremia: Secondary | ICD-10-CM | POA: Diagnosis present

## 2017-05-23 DIAGNOSIS — K219 Gastro-esophageal reflux disease without esophagitis: Secondary | ICD-10-CM | POA: Diagnosis present

## 2017-05-23 DIAGNOSIS — E7800 Pure hypercholesterolemia, unspecified: Secondary | ICD-10-CM | POA: Diagnosis present

## 2017-05-23 DIAGNOSIS — I959 Hypotension, unspecified: Secondary | ICD-10-CM | POA: Diagnosis present

## 2017-05-23 DIAGNOSIS — I5032 Chronic diastolic (congestive) heart failure: Secondary | ICD-10-CM | POA: Diagnosis present

## 2017-05-23 DIAGNOSIS — K760 Fatty (change of) liver, not elsewhere classified: Secondary | ICD-10-CM | POA: Diagnosis present

## 2017-05-23 DIAGNOSIS — I130 Hypertensive heart and chronic kidney disease with heart failure and stage 1 through stage 4 chronic kidney disease, or unspecified chronic kidney disease: Secondary | ICD-10-CM | POA: Diagnosis present

## 2017-05-23 DIAGNOSIS — I428 Other cardiomyopathies: Secondary | ICD-10-CM | POA: Diagnosis not present

## 2017-05-23 DIAGNOSIS — K7010 Alcoholic hepatitis without ascites: Secondary | ICD-10-CM | POA: Diagnosis not present

## 2017-05-23 DIAGNOSIS — L89312 Pressure ulcer of right buttock, stage 2: Secondary | ICD-10-CM | POA: Diagnosis present

## 2017-05-23 DIAGNOSIS — Z96641 Presence of right artificial hip joint: Secondary | ICD-10-CM | POA: Diagnosis present

## 2017-05-23 DIAGNOSIS — Z811 Family history of alcohol abuse and dependence: Secondary | ICD-10-CM

## 2017-05-23 DIAGNOSIS — D72825 Bandemia: Secondary | ICD-10-CM

## 2017-05-23 DIAGNOSIS — F419 Anxiety disorder, unspecified: Secondary | ICD-10-CM | POA: Diagnosis present

## 2017-05-23 DIAGNOSIS — I471 Supraventricular tachycardia: Secondary | ICD-10-CM | POA: Diagnosis present

## 2017-05-23 DIAGNOSIS — L03116 Cellulitis of left lower limb: Secondary | ICD-10-CM | POA: Diagnosis present

## 2017-05-23 DIAGNOSIS — N179 Acute kidney failure, unspecified: Secondary | ICD-10-CM | POA: Diagnosis present

## 2017-05-23 DIAGNOSIS — K7460 Unspecified cirrhosis of liver: Secondary | ICD-10-CM | POA: Diagnosis present

## 2017-05-23 DIAGNOSIS — F1011 Alcohol abuse, in remission: Secondary | ICD-10-CM

## 2017-05-23 DIAGNOSIS — D696 Thrombocytopenia, unspecified: Secondary | ICD-10-CM | POA: Diagnosis present

## 2017-05-23 DIAGNOSIS — N182 Chronic kidney disease, stage 2 (mild): Secondary | ICD-10-CM | POA: Diagnosis present

## 2017-05-23 DIAGNOSIS — K7040 Alcoholic hepatic failure without coma: Principal | ICD-10-CM | POA: Diagnosis present

## 2017-05-23 DIAGNOSIS — K7030 Alcoholic cirrhosis of liver without ascites: Secondary | ICD-10-CM | POA: Diagnosis not present

## 2017-05-23 DIAGNOSIS — F1020 Alcohol dependence, uncomplicated: Secondary | ICD-10-CM | POA: Diagnosis present

## 2017-05-23 DIAGNOSIS — Z66 Do not resuscitate: Secondary | ICD-10-CM | POA: Diagnosis not present

## 2017-05-23 DIAGNOSIS — R9431 Abnormal electrocardiogram [ECG] [EKG]: Secondary | ICD-10-CM | POA: Diagnosis present

## 2017-05-23 DIAGNOSIS — K7011 Alcoholic hepatitis with ascites: Secondary | ICD-10-CM | POA: Diagnosis present

## 2017-05-23 DIAGNOSIS — K7200 Acute and subacute hepatic failure without coma: Secondary | ICD-10-CM | POA: Diagnosis present

## 2017-05-23 DIAGNOSIS — K7290 Hepatic failure, unspecified without coma: Secondary | ICD-10-CM | POA: Diagnosis not present

## 2017-05-23 DIAGNOSIS — Z888 Allergy status to other drugs, medicaments and biological substances status: Secondary | ICD-10-CM

## 2017-05-23 DIAGNOSIS — Z515 Encounter for palliative care: Secondary | ICD-10-CM | POA: Diagnosis present

## 2017-05-23 DIAGNOSIS — R188 Other ascites: Secondary | ICD-10-CM

## 2017-05-23 DIAGNOSIS — K7031 Alcoholic cirrhosis of liver with ascites: Secondary | ICD-10-CM | POA: Diagnosis present

## 2017-05-23 DIAGNOSIS — Z87898 Personal history of other specified conditions: Secondary | ICD-10-CM

## 2017-05-23 DIAGNOSIS — M130 Polyarthritis, unspecified: Secondary | ICD-10-CM | POA: Diagnosis present

## 2017-05-23 DIAGNOSIS — Z7982 Long term (current) use of aspirin: Secondary | ICD-10-CM

## 2017-05-23 DIAGNOSIS — E860 Dehydration: Secondary | ICD-10-CM | POA: Diagnosis present

## 2017-05-23 DIAGNOSIS — R627 Adult failure to thrive: Secondary | ICD-10-CM | POA: Diagnosis present

## 2017-05-23 DIAGNOSIS — D72829 Elevated white blood cell count, unspecified: Secondary | ICD-10-CM | POA: Diagnosis present

## 2017-05-23 DIAGNOSIS — Z7952 Long term (current) use of systemic steroids: Secondary | ICD-10-CM

## 2017-05-23 DIAGNOSIS — L89322 Pressure ulcer of left buttock, stage 2: Secondary | ICD-10-CM | POA: Diagnosis present

## 2017-05-23 DIAGNOSIS — Z79899 Other long term (current) drug therapy: Secondary | ICD-10-CM

## 2017-05-23 DIAGNOSIS — Z7189 Other specified counseling: Secondary | ICD-10-CM | POA: Diagnosis not present

## 2017-05-23 DIAGNOSIS — Z8249 Family history of ischemic heart disease and other diseases of the circulatory system: Secondary | ICD-10-CM

## 2017-05-23 DIAGNOSIS — K7682 Hepatic encephalopathy: Secondary | ICD-10-CM | POA: Diagnosis present

## 2017-05-23 LAB — BLOOD GAS, VENOUS
ACID-BASE DEFICIT: 2.2 mmol/L — AB (ref 0.0–2.0)
Bicarbonate: 21.5 mmol/L (ref 20.0–28.0)
DRAWN BY: 270211
O2 SAT: 36.5 %
PATIENT TEMPERATURE: 98.6
PCO2 VEN: 35.6 mmHg — AB (ref 44.0–60.0)
pH, Ven: 7.399 (ref 7.250–7.430)

## 2017-05-23 LAB — ETHANOL

## 2017-05-23 LAB — COMPREHENSIVE METABOLIC PANEL
ALBUMIN: 1.9 g/dL — AB (ref 3.5–5.0)
ALK PHOS: 218 U/L — AB (ref 38–126)
ALT: 158 U/L — AB (ref 17–63)
ANION GAP: 12 (ref 5–15)
AST: 260 U/L — ABNORMAL HIGH (ref 15–41)
BUN: 64 mg/dL — ABNORMAL HIGH (ref 6–20)
CALCIUM: 8.2 mg/dL — AB (ref 8.9–10.3)
CHLORIDE: 93 mmol/L — AB (ref 101–111)
CO2: 21 mmol/L — AB (ref 22–32)
Creatinine, Ser: 1.93 mg/dL — ABNORMAL HIGH (ref 0.61–1.24)
GFR calc Af Amer: 40 mL/min — ABNORMAL LOW (ref >60)
GFR calc non Af Amer: 34 mL/min — ABNORMAL LOW (ref >60)
GLUCOSE: 103 mg/dL — AB (ref 65–99)
Potassium: 4.3 mmol/L (ref 3.5–5.1)
SODIUM: 126 mmol/L — AB (ref 135–145)
Total Bilirubin: 24.3 mg/dL (ref 0.3–1.2)
Total Protein: 6.2 g/dL — ABNORMAL LOW (ref 6.5–8.1)

## 2017-05-23 LAB — CBC
HCT: 36.3 % — ABNORMAL LOW (ref 39.0–52.0)
HEMOGLOBIN: 13.2 g/dL (ref 13.0–17.0)
MCH: 38.2 pg — AB (ref 26.0–34.0)
MCHC: 36.4 g/dL — AB (ref 30.0–36.0)
MCV: 104.9 fL — AB (ref 78.0–100.0)
Platelets: 110 10*3/uL — ABNORMAL LOW (ref 150–400)
RBC: 3.46 MIL/uL — AB (ref 4.22–5.81)
RDW: 15.6 % — ABNORMAL HIGH (ref 11.5–15.5)
WBC: 16.2 10*3/uL — ABNORMAL HIGH (ref 4.0–10.5)

## 2017-05-23 LAB — I-STAT CHEM 8, ED
BUN: 54 mg/dL — AB (ref 6–20)
CHLORIDE: 96 mmol/L — AB (ref 101–111)
CREATININE: 2.2 mg/dL — AB (ref 0.61–1.24)
Calcium, Ion: 0.88 mmol/L — CL (ref 1.15–1.40)
GLUCOSE: 95 mg/dL (ref 65–99)
HCT: 44 % (ref 39.0–52.0)
HEMOGLOBIN: 15 g/dL (ref 13.0–17.0)
POTASSIUM: 4.4 mmol/L (ref 3.5–5.1)
Sodium: 126 mmol/L — ABNORMAL LOW (ref 135–145)
TCO2: 18 mmol/L — ABNORMAL LOW (ref 22–32)

## 2017-05-23 LAB — URINALYSIS, ROUTINE W REFLEX MICROSCOPIC
GLUCOSE, UA: NEGATIVE mg/dL
Ketones, ur: NEGATIVE mg/dL
NITRITE: NEGATIVE
PROTEIN: NEGATIVE mg/dL
Specific Gravity, Urine: 1.015 (ref 1.005–1.030)
pH: 5 (ref 5.0–8.0)

## 2017-05-23 LAB — I-STAT CG4 LACTIC ACID, ED: Lactic Acid, Venous: 3.68 mmol/L (ref 0.5–1.9)

## 2017-05-23 LAB — RAPID URINE DRUG SCREEN, HOSP PERFORMED
AMPHETAMINES: NOT DETECTED
Barbiturates: NOT DETECTED
Benzodiazepines: NOT DETECTED
COCAINE: NOT DETECTED
OPIATES: NOT DETECTED
TETRAHYDROCANNABINOL: NOT DETECTED

## 2017-05-23 LAB — AMMONIA: AMMONIA: 39 umol/L — AB (ref 9–35)

## 2017-05-23 LAB — PROTIME-INR
INR: 2.03
PROTHROMBIN TIME: 22.8 s — AB (ref 11.4–15.2)

## 2017-05-23 LAB — SODIUM, URINE, RANDOM: Sodium, Ur: <10 mmol/L

## 2017-05-23 MED ORDER — MIRTAZAPINE 15 MG PO TABS
15.0000 mg | ORAL_TABLET | Freq: Every day | ORAL | Status: DC
  Administered 2017-05-24: 15 mg via ORAL
  Filled 2017-05-23: qty 1

## 2017-05-23 MED ORDER — SODIUM CHLORIDE 0.9 % IV BOLUS (SEPSIS)
1000.0000 mL | Freq: Once | INTRAVENOUS | Status: AC
  Administered 2017-05-23: 1000 mL via INTRAVENOUS

## 2017-05-23 MED ORDER — PANTOPRAZOLE SODIUM 40 MG PO TBEC
40.0000 mg | DELAYED_RELEASE_TABLET | Freq: Every day | ORAL | Status: DC
  Administered 2017-05-24 – 2017-05-28 (×5): 40 mg via ORAL
  Filled 2017-05-23 (×5): qty 1

## 2017-05-23 MED ORDER — FOLIC ACID 1 MG PO TABS
1.0000 mg | ORAL_TABLET | Freq: Every day | ORAL | Status: DC
  Administered 2017-05-24 – 2017-05-28 (×5): 1 mg via ORAL
  Filled 2017-05-23 (×5): qty 1

## 2017-05-23 MED ORDER — ALBUMIN HUMAN 25 % IV SOLN
12.5000 g | Freq: Once | INTRAVENOUS | Status: AC
  Administered 2017-05-24: 12.5 g via INTRAVENOUS
  Filled 2017-05-23: qty 50

## 2017-05-23 MED ORDER — SODIUM CHLORIDE 0.9 % IV SOLN
2.0000 g | Freq: Every day | INTRAVENOUS | Status: DC
  Administered 2017-05-24 – 2017-05-25 (×3): 2 g via INTRAVENOUS
  Filled 2017-05-23: qty 20
  Filled 2017-05-23 (×3): qty 2

## 2017-05-23 MED ORDER — METOPROLOL TARTRATE 25 MG PO TABS
12.5000 mg | ORAL_TABLET | Freq: Three times a day (TID) | ORAL | Status: DC
  Administered 2017-05-24 – 2017-05-26 (×7): 12.5 mg via ORAL
  Filled 2017-05-23 (×10): qty 1

## 2017-05-23 MED ORDER — ONDANSETRON HCL 4 MG PO TABS: 4.0000 mg | ORAL_TABLET | Freq: Four times a day (QID) | ORAL | Status: DC | PRN

## 2017-05-23 MED ORDER — VITAMIN B-1 100 MG PO TABS
100.0000 mg | ORAL_TABLET | Freq: Every day | ORAL | Status: DC
  Administered 2017-05-24 – 2017-05-28 (×5): 100 mg via ORAL
  Filled 2017-05-23 (×5): qty 1

## 2017-05-23 MED ORDER — POLYVINYL ALCOHOL 1.4 % OP SOLN: 1.0000 [drp] | Freq: Four times a day (QID) | OPHTHALMIC | Status: DC | PRN

## 2017-05-23 MED ORDER — RIFAXIMIN 550 MG PO TABS
550.0000 mg | ORAL_TABLET | Freq: Two times a day (BID) | ORAL | Status: DC
  Administered 2017-05-24 – 2017-05-28 (×11): 550 mg via ORAL
  Filled 2017-05-23 (×11): qty 1

## 2017-05-23 MED ORDER — PREDNISOLONE 5 MG PO TABS
40.0000 mg | ORAL_TABLET | Freq: Every day | ORAL | Status: DC
  Administered 2017-05-24 – 2017-05-28 (×5): 40 mg via ORAL
  Filled 2017-05-23 (×5): qty 8

## 2017-05-23 MED ORDER — ONDANSETRON HCL 4 MG/2ML IJ SOLN: 4.0000 mg | Freq: Four times a day (QID) | INTRAMUSCULAR | Status: DC | PRN

## 2017-05-23 MED ORDER — SODIUM CHLORIDE 0.9 % IV SOLN
INTRAVENOUS | Status: DC
  Administered 2017-05-24: via INTRAVENOUS

## 2017-05-23 MED ORDER — LACTULOSE 10 GM/15ML PO SOLN
30.0000 g | Freq: Three times a day (TID) | ORAL | Status: DC
  Administered 2017-05-24 – 2017-05-28 (×15): 30 g via ORAL
  Filled 2017-05-23 (×16): qty 60

## 2017-05-23 NOTE — ED Notes (Signed)
Patient incontinent of stool and urine. Gown, bed linen, and patient changed/cleaned.

## 2017-05-23 NOTE — H&P (Addendum)
Gary [name] ZOX:[id number] DOB: [birth year] DOA: 05/23/2017     PCP: Gary Hansen, NP   Outpatient Specialists:    Gary [name]: Dr. Lavon [name] Patient arrived to ER on 05/23/17 at 1547  Patient coming from:   home Lives  With family    Chief Complaint:  Chief Complaint  Patient presents with  . Failure To Thrive    HPI: Gary [name] is a 67 y.o. male with medical history significant of alcohol induced cirrhosis, arthritis, hypertension, reflux, alcohol abuse, diastolic CHF   Presented with altered mental status family states patient no longer talking to them.  Patient's family unsure if he has been taking any of his medications but states he will likely stop drinking.  Patient himself minimally answering questions appears to be confused  Patient just recently admitted for the same has been seen by GI while hospitalized discharged on 14 March Palliative care was consulted and will need management patient chose to remain full code physical therapy seen him and recommended nursing home placement on discharge.  Bed was not available secondary to insurance problems and patient chose to go home instead patient left AMA as home felt to be unsafe place for discharge for patient. While hospitalized atrial tachycardia was noted cardiology was consulted and adjusted his medication patient improved with metoprolol. His Lasix and spironolactone had to be held secondary to sodium being too low plan to restart if his sodium goes up above 132.  And BUN less than 25. At that time GI recommended restarting Lasix at 20 mg and Aldactone 25 During hospitalization Maddrey Discriminant Function Score was >60 He was started on prednisone plan to continue for 30-day course. Patient has had paracentesis showed no evidence of SBP. Abdominal ultrasound done on 5 March showed hepatic cirrhosis and small volume ascites. Patient is supposed to follow-up with GI and have a EGD done as an outpatient. Patient was supposed  to use oral lactulose after discharge but family unsure if he has been. Patient states he stopped drinking EtOH and have been taking his home medications.  Regarding history of CHF echogram in July 2018 with preserved EF and grade 1 diastolic dysfunction While in ER: Patient slowly started to come around and became more alert which point he denied any current abdominal pain  Significant initial  Findings: Abnormal Labs Reviewed  COMPREHENSIVE METABOLIC PANEL - Abnormal; Notable for the following components:      Result Value   Sodium 126 (*)    Chloride 93 (*)    CO2 21 (*)    Glucose, Bld 103 (*)    BUN 64 (*)    Creatinine, Ser 1.93 (*)    Calcium 8.2 (*)    Total Protein 6.2 (*)    Albumin 1.9 (*)    AST 260 (*)    ALT 158 (*)    Alkaline Phosphatase 218 (*)    Total Bilirubin 24.3 (*)    GFR calc non Af Amer 34 (*)    GFR calc Af Amer 40 (*)    All other components within normal limits  CBC - Abnormal; Notable for the following components:   WBC 16.2 (*)    RBC 3.46 (*)    HCT 36.3 (*)    MCV 104.9 (*)    MCH 38.2 (*)    MCHC 36.4 (*)    RDW 15.6 (*)    Platelets 110 (*)    All other components within normal limits  AMMONIA - Abnormal;  Notable for the following components:   Ammonia 39 (*)    All other components within normal limits  BLOOD GAS, VENOUS - Abnormal; Notable for the following components:   pCO2, Ven 35.6 (*)    Acid-base deficit 2.2 (*)    All other components within normal limits  I-STAT CHEM 8, ED - Abnormal; Notable for the following components:   Sodium 126 (*)    Chloride 96 (*)    BUN 54 (*)    Creatinine, Ser 2.20 (*)    Calcium, Ion 0.88 (*)    TCO2 18 (*)    All other components within normal limits  I-STAT CG4 LACTIC ACID, ED - Abnormal; Notable for the following components:   Lactic Acid, Venous 3.68 (*)    All other components within normal limits    K 4.3  Cr  Up from baseline see below Lab Results  Component Value Date    CREATININE 1.93 (H) 05/23/2017   CREATININE 2.20 (H) 05/23/2017   CREATININE 0.80 05/14/2017      WBC  16.2 she has chronically WBC count   HG/HCT Up from baseline see below    Component Value Date/Time   HGB 13.2 05/23/2017 1838   HCT 36.3 (L) 05/23/2017 1838    Ionized calcium 0.88   Lactic Acid, Venous    Component Value Date/Time   LATICACIDVEN 3.68 (HH) 05/23/2017 1825      UA  ordered   CT HEAD    NON acute  CXR -   NON acute cardiomegaly   ECG:  Showed intermittent junctional tachycardia with heart rate up to 139 and prolonged QTC at 570     ED Triage Vitals  Enc Vitals Group     BP 05/23/17 1626 107/68     Pulse Rate 05/23/17 1626 96     Resp 05/23/17 1626 16     Temp 05/23/17 1626 98.2 F (36.8 C)     Temp Source 05/23/17 1626 Oral     SpO2 05/23/17 1626 94 %     Weight --      Height --      Head Circumference --      Peak Flow --      Pain Score 05/23/17 1630 0     Pain Loc --      Pain Edu? --      Excl. in GC? --   TMAX(24)@     on arrival  ED Triage Vitals  Enc Vitals Group     BP 05/23/17 1626 107/68     Pulse Rate 05/23/17 1626 96     Resp 05/23/17 1626 16     Temp 05/23/17 1626 98.2 F (36.8 C)     Temp Source 05/23/17 1626 Oral     SpO2 05/23/17 1626 94 %     Weight --      Height --      Head Circumference --      Peak Flow --      Pain Score 05/23/17 1630 0     Pain Loc --      Pain Edu? --      Excl. in GC? --      Latest  Blood pressure 125/75, pulse 84, temperature 98.2 F (36.8 C), temperature source Oral, resp. rate 16, SpO2 96 %.   Following Medications were ordered in ER: Medications  sodium chloride 0.9 % bolus 1,000 mL (1,000 mLs Intravenous New Bag/Given 05/23/17 1906)  Hospitalist was called for admission for acute hepatic encephalopathy with decompensated alcoholic cirrhosis with progression      Review of Systems:    Pertinent positives include:  Bilateral lower extremity swelling  shortness of  breath at rest. jaundice Constitutional:  No weight loss, night sweats, Fevers, chills, fatigue, weight loss  HEENT:  No headaches, Difficulty swallowing,Tooth/dental problems,Sore throat,  No sneezing, itching, ear ache, nasal congestion, post nasal drip,  Cardio-vascular:  No chest pain, Orthopnea, PND, anasarca, dizziness, palpitations.no  GI:  No heartburn, indigestion, abdominal pain, nausea, vomiting, diarrhea, change in bowel habits, loss of appetite, melena, blood in stool, hematemesis Resp:  no No dyspnea on exertion, No excess mucus, no productive cough, No non-productive cough, No coughing up of blood.No change in color of mucus.No wheezing. Skin:  no rash or lesions. No  GU:  no dysuria, change in color of urine, no urgency or frequency. No straining to urinate.  No flank pain.  Musculoskeletal:  No joint pain or no joint swelling. No decreased range of motion. No back pain.  Psych:  No change in mood or affect. No depression or anxiety. No memory loss.  Neuro: no localizing neurological complaints, no tingling, no weakness, no double vision, no gait abnormality, no slurred speech, no confusion  As per HPI otherwise 10 point review of systems negative.   Past Medical History:   Past Medical History:  Diagnosis Date  . Alcohol abuse   . Alcoholic cirrhosis (HCC)    Overview:  Hx of encephalopathy  . Alcoholic encephalopathy (HCC) 1/61/0960  . Alcoholic hepatitis without ascites 02/25/2014  . Altered mental status 06/15/2015  . Arthritis of right hip 09/12/2016  . Ascites 04/25/2014  . CHF (congestive heart failure) (HCC)   . Chronic pain of right hip 11/09/2015  . Esophageal reflux   . Essential hypertension 11/10/2014   Overview:  Was on lisinopril 20 mg daily per records  . GERD (gastroesophageal reflux disease)   . History of hiatal hernia   . Hyperbilirubinemia 03/01/2014   Overview:  Last Assessment & Plan:  Likely due to liver cirrhosis, ascites has macrocytic  anemia, thrombocytopenia, hepatic encephalopathy/hyperammonemia - CT abdomen showed hepatic cirrhosis with degenerative nodules - Ultrasound showed severe hepatic steatosis, trace amount of perisplenic ascites - on steroids for acute alcoholic hepatitis, patient has GI appointment for further steroid taper outpatient , continue current dose for 28 days. - AFP 2.4 - Placed on propranolol, Aldactone 50 mg daily. Follow potassium level. SNF - cont propranolol and aldactone; f/u CMP for K and bilirubin  . Hypercholesterolemia   . Hypertension   . Inflammatory liver disease 11/21/2014  . Macrocytic anemia   . Maxillary sinus fracture (HCC) 04/05/2015  . Osteoarthritis of right hip 11/10/2015  . Polyarthritis of multiple sites   . Pure hypercholesterolemia 10/19/2014   Overview:  LDL 206 per records 03/2014. Not on statin due to liver disease  . Seizures (HCC)    pt. denies   . Thrombocytopenia (HCC)   . Tremors of nervous system       Past Surgical History:  Procedure Laterality Date  . TONSILLECTOMY    . TOTAL HIP ARTHROPLASTY Right 09/22/2016   Procedure: TOTAL HIP ARTHROPLASTY ANTERIOR APPROACH;  Surgeon: Gean Birchwood, MD;  Location: [facility] OR;  Service: Orthopedics;  Laterality: Right;  . WISDOM TOOTH EXTRACTION      Social History:  Ambulatory  walker        reports that he has never smoked. He has never  used smokeless tobacco. He reports that he drinks about 4.8 - 7.8 oz of alcohol per week. He reports that he does not use drugs.   Family History:   Family History  Problem Relation Age of Onset  . Heart failure Father   . Alcohol abuse Father   . Heart attack Father   . COPD Mother   . Colon cancer Neg Hx   . Colon polyps Neg Hx   . Diabetes Neg Hx   . Kidney disease Neg Hx   . Gallbladder disease Neg Hx   . Esophageal cancer Neg Hx     Allergies: Allergies  Allergen Reactions  . Ace Inhibitors Other (See Comments)    Cough with lisinopril     Prior to Admission  medications   Medication Sig Start Date End Date Taking? Authorizing Provider  aspirin EC 325 MG tablet Take 1 tablet (325 mg total) by mouth 2 (two) times daily. Patient taking differently: Take 325 mg by mouth daily.  09/22/16  Yes Allena Katz, PA-C  folic acid (FOLVITE) 1 MG tablet Take 1 mg by mouth daily.   Yes [provider]  furosemide (LASIX) 40 MG tablet Take 0.5 tablets (20 mg total) by mouth daily. Resume when Sodium is >132 05/17/17  Yes Sheikh, Omair Latif, DO  hydroxypropyl methylcellulose / hypromellose (ISOPTO TEARS / GONIOVISC) 2.5 % ophthalmic solution Place 1 drop into both eyes 4 (four) times daily as needed for dry eyes. 06/20/15  Yes Rai, Ripudeep K, MD  lactulose (CHRONULAC) 10 GM/15ML solution Take 45 mLs (30 g total) by mouth 3 (three) times daily. 05/14/17  Yes Sheikh, Omair Latif, DO  liver oil-zinc oxide (DESITIN) 40 % ointment Apply topically 2 (two) times daily. 05/14/17  Yes Sheikh, Omair Latif, DO  metoprolol tartrate (LOPRESSOR) 25 MG tablet Take 0.5 tablets (12.5 mg total) by mouth 3 (three) times daily. 05/14/17  Yes Sheikh, Omair Latif, DO  mirtazapine (REMERON) 15 MG tablet Take 1 tablet (15 mg total) by mouth at bedtime. 05/14/17  Yes Sheikh, Omair Latif, DO  nystatin ointment (MYCOSTATIN) Apply topically 2 (two) times daily. 05/14/17  Yes Sheikh, Omair Latif, DO  Omega-3 Fatty Acids (FISH OIL) 500 MG CAPS Take 500 mg by mouth daily.   Yes [provider]  pantoprazole (PROTONIX) 40 MG tablet Take 1 tablet (40 mg total) by mouth daily. Patient taking differently: Take 40 mg by mouth daily before breakfast.  04/26/14  Yes Vassie Loll, MD  prednisoLONE 5 MG TABS tablet Take 8 tablets (40 mg total) by mouth daily. 05/15/17  Yes Sheikh, Omair Latif, DO  rifaximin (XIFAXAN) 550 MG TABS tablet Take 1 tablet (550 mg total) by mouth 2 (two) times daily. 05/14/17  Yes Sheikh, Omair Latif, DO  spironolactone (ALDACTONE) 50 MG tablet Take 0.5 tablets (25 mg  total) by mouth daily. Resume when Sodium is >132. 05/17/17  Yes Sheikh, Omair Latif, DO  thiamine 100 MG tablet Take 1 tablet (100 mg total) by mouth daily. 05/15/17  Yes Sheikh, Omair Latif, DO  tiZANidine (ZANAFLEX) 2 MG tablet Take 1 tablet (2 mg total) by mouth every 6 (six) hours as needed for muscle spasms. 09/22/16  Yes Allena Katz, PA-C  triamcinolone ointment (KENALOG) 0.1 % Apply 1 application topically 2 (two) times daily. 02/13/17  Yes [provider]  Multiple Vitamin (MULTIVITAMIN WITH MINERALS) TABS tablet Take 1 tablet by mouth daily. Patient not taking: Reported on 05/03/2017 03/04/14   Rodolph Bong, MD  Physical Exam: Blood pressure 125/75, pulse 84, temperature 98.2 F (36.8 C), temperature source Oral, resp. rate 16, SpO2 96 %. 1. General:  in No Acute distress   Chronically ill  -appearing 2. Psychological: Alert but not  Oriented 3. Head/ENT:    Dry Mucous Membranes                          Head Non traumatic, neck supple                            Poor Dentition                             Icteric sclera 4. SKIN:  Jaundiced decreased Skin turgor,  Skin clean Dry and intact multiple bruises, left leg redness noted 5. Heart: Regular rate and rhythm no  Murmur, no Rub or gallop 6. Lungs:   no wheezes or crackles   7. Abdomen: Soft,  non-tender,   distended  bowel sounds present 8. Lower extremities: no clubbing, cyanosis, 2+ edema 9. Neurologically Grossly intact, moving all 4 extremities equally  asterixis noted 10. MSK: Normal range of motion   LABS:     Recent Labs  Lab 05/23/17 1825 05/23/17 1838  WBC  --  16.2*  HGB 15.0 13.2  HCT 44.0 36.3*  MCV  --  104.9*  PLT  --  110*   Basic Metabolic Panel: Recent Labs  Lab 05/23/17 1825 05/23/17 1838  NA 126* 126*  K 4.4 4.3  CL 96* 93*  CO2  --  21*  GLUCOSE 95 103*  BUN 54* 64*  CREATININE 2.20* 1.93*  CALCIUM  --  8.2*      Recent Labs  Lab 05/23/17 1838  AST 260*  ALT 158*   ALKPHOS 218*  BILITOT 24.3*  PROT 6.2*  ALBUMIN 1.9*   No results for input(s): LIPASE, AMYLASE in the last 168 hours. Recent Labs  Lab 05/23/17 1838  AMMONIA 39*     Urine analysis:    Component Value Date/Time   COLORURINE AMBER (A) 05/23/2017 2015   APPEARANCEUR HAZY (A) 05/23/2017 2015   LABSPEC 1.015 05/23/2017 2015   PHURINE 5.0 05/23/2017 2015   GLUCOSEU NEGATIVE 05/23/2017 2015   HGBUR SMALL (A) 05/23/2017 2015   BILIRUBINUR MODERATE (A) 05/23/2017 2015   KETONESUR NEGATIVE 05/23/2017 2015   PROTEINUR NEGATIVE 05/23/2017 2015   UROBILINOGEN >8.0 (H) 11/10/2014 1548   NITRITE NEGATIVE 05/23/2017 2015   LEUKOCYTESUR MODERATE (A) 05/23/2017 2015    Cultures:    Component Value Date/Time   SDES ABDOMEN FLUID 05/03/2017 1430   SPECREQUEST NONE 05/03/2017 1430   CULT  05/03/2017 0849    NO GROWTH 5 DAYS Performed at [facility], [address] [place], [place] [address]    REPTSTATUS 05/03/2017 FINAL 05/03/2017 1430     Radiological Exams on Admission: Dg Chest 2 View  Result Date: 05/23/2017 CLINICAL DATA:  Has not eaten for 2 days, jaundiced, history CHF, hypertension EXAM: CHEST - 2 VIEW COMPARISON:  05/03/2017 FINDINGS: Mild elevation of RIGHT diaphragm. Enlargement of cardiac silhouette. Mediastinal contours and pulmonary vascularity normal. No gross infiltrate, pleural effusion or pneumothorax. Bones unremarkable. IMPRESSION: Mild enlargement of cardiac silhouette. No acute abnormalities. Electronically Signed   By: Ulyses Southward M.D.   On: 05/23/2017 17:34   Ct Head Wo Contrast  Result  Date: 05/23/2017 CLINICAL DATA:  Altered level of consciousness. EXAM: CT HEAD WITHOUT CONTRAST TECHNIQUE: Contiguous axial images were obtained from the base of the skull through the vertex without intravenous contrast. COMPARISON:  06/15/2015 FINDINGS: Brain: There is no evidence for acute hemorrhage, hydrocephalus, mass lesion, or abnormal extra-axial fluid collection.  No definite CT evidence for acute infarction. Diffuse loss of parenchymal volume is consistent with atrophy. Patchy low attenuation in the deep hemispheric and periventricular white matter is nonspecific, but likely reflects chronic microvascular ischemic demyelination. Age indeterminate lacunar infarct noted right basal ganglia. Vascular: Atherosclerotic calcification of the carotid siphons noted at the skull base. No dense MCA sign. Skull: No evidence for fracture. No worrisome lytic or sclerotic lesion. Sinuses/Orbits: The visualized paranasal sinuses and mastoid air cells are clear. Visualized portions of the globes and intraorbital fat are unremarkable. Other: None. IMPRESSION: 1. No acute intracranial abnormality. 2. Atrophy with chronic small vessel white matter ischemic disease. Electronically Signed   By: Kennith Center M.D.   On: 05/23/2017 18:08    Chart has been reviewed    Assessment/Plan   67 y.o. male with medical history significant of alcohol induced cirrhosis, arthritis, hypertension, reflux, alcohol abuse, diastolic CHF   Admitted for acute hepatic encephalopathy  Present on Admission: . Acute hepatic encephalopathy -versus metabolic encephalopathy from UTI   - most likely multifactorial secondary to combination of  infection evidence of hepatic encephalopathy Start lactulose aggressively  - Will rehydrate   - treat underlining infection    . Leukocytosis also may have UTI continue to follow . Thrombocytopenia (HCC) secondary to cirrhosis denies any history of bleeding . Hyponatremia most likely secondary to cirrhosis continue to hold Lasix and spironolactone . Hepatic cirrhosis (HCC) - very poor prognosis again ordered palliative care consult to have extensive discussion with patient given very high risk of mortality in the near future Continue steroids as per prior given decompensated alcoholic cirrhosis . AKI (acute kidney injury) (HCC) versus hepatorenal syndrome will  give a trial of IV fluids and albumin check urine electrolytes If no improvement would probably benefit from nephrology consult . Ascites significant causing some degree of shortness of breath.  No abdominal pain to suggest SBP ordered IR guided paracentesis . Left leg cellulitis-evidence of erythema of left lower extremity unsure if this is contributing to mental status changes patient states that this is new but unclear if a good historian.  Given mild illness for now, with Rocephin check MRSA . UTI (urinary tract infection) await results of urine culture for now treat with Rocephin Junctional tachycardia patient was noted to have occasional episodes of tachycardia.  States that he has forgotten to take his metoprolol today we will restart.  Check TSH.  Monitor electrolytes and replete as needed.  No associated chest pain or shortness of breath. Unsure if he has prior history of this can recheck echogram  History of diastolic CHF -currently appears to be most likely intravascular depleted and peripherally edematous secondary to hypoalbuminemia but worsening renal function.  Administer albumin attempt to administer gentle IV fluids.  Given abnormal rhythm obtain echo on return telemetry  Study of alcohol abuse currently states he stopped drinking.  Monitor for any signs of withdrawal Other plan as per orders.  DVT prophylaxis:  SCD   Code Status:  FULL CODE  as per patient    Family Communication:   Family not  at  Bedside    Disposition Plan:    likely will need placement for rehabilitation  Would benefit from PT/OT eval prior to DC  ordered                Palliative care   consulted                          Consults called: None she needs to be evaluated by GI as an outpatient or in-house and if continues to decompensate  Admission status:  inpatient     Level of care    tele           I have spent a total of on this admission [address] 05/24/2017, 12:44 AM    [facility]  Pager [phone]   after 2 AM please page floor coverage PA If 7AM-7PM, please contact the day team taking care of the patient  [email or web address]  Password [email or web address]

## 2017-05-23 NOTE — ED Notes (Signed)
ED TO INPATIENT HANDOFF REPORT  Name/Age/Gender Gary [name] 67 y.o. male  Code Status Code Status History    Date Active Date Inactive Code Status Order ID Comments User Context   05/03/2017 1424 05/14/2017 2228 Full Code [id number]  Elwin Mocha, MD ED   09/22/2016 1238 09/23/2016 1926 Full Code [id number]  Leighton Parody, PA-C Inpatient   06/15/2015 0515 06/20/2015 1618 Full Code [id number]  Edwin Dada, MD Inpatient   04/06/2015 0052 04/08/2015 1736 Full Code [id number]  Georganna Skeans, MD Inpatient   11/10/2014 2055 11/13/2014 1712 Full Code [id number]  Jonetta Osgood, MD Inpatient   04/25/2014 1245 04/26/2014 1731 Full Code [id number]  Verlee Monte, MD ED   02/25/2014 2030 03/04/2014 2012 Full Code [id number]  Theodis Blaze, MD Inpatient      Home/SNF/Other Home  Chief Complaint Constipation/ Loss of Appetite   Level of Care/Admitting Diagnosis ED Disposition    ED Disposition Condition [place]: [facility] [[id number]]  Level of Care: Telemetry [5]  Admit to tele based on following criteria: Other see comments  Comments: abnormal electrolytes  Diagnosis: Hepatic encephalopathy ([place]) [572.2.ICD-9-CM]  Admitting Physician: Toy Baker [[id number]]  Attending Physician: Toy Baker [[id number]]  Estimated length of stay: 3 - 4 days  Certification:: I certify this patient will need inpatient services for at least 2 midnights  PT Class (Do Not Modify): Inpatient [101]  PT Acc Code (Do Not Modify): Private [1]       Medical History Past Medical History:  Diagnosis Date  . Alcohol abuse   . Alcoholic cirrhosis (HCC)    Overview:  Hx of encephalopathy  . 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 Alcoholic hepatitis without ascites 02/25/2014  . Altered mental status 06/15/2015  . Arthritis of right hip 09/12/2016  . Ascites 04/25/2014  . CHF (congestive heart failure) ([place])   . Chronic pain of right hip 11/09/2015  .  Esophageal reflux   . Essential hypertension 11/10/2014   Overview:  Was on lisinopril 20 mg daily per records  . GERD (gastroesophageal reflux disease)   . History of hiatal hernia   . Hyperbilirubinemia 03/01/2014   Overview:  Last Assessment & Plan:  Likely due to liver cirrhosis, ascites has macrocytic anemia, thrombocytopenia, hepatic encephalopathy/hyperammonemia - CT abdomen showed hepatic cirrhosis with degenerative nodules - Ultrasound showed severe hepatic steatosis, trace amount of perisplenic ascites - on steroids for acute alcoholic hepatitis, patient has GI appointment for further steroid taper outpatient , continue current dose for 28 days. - AFP 2.4 - Placed on propranolol, Aldactone 50 mg daily. Follow potassium level. SNF - cont propranolol and aldactone; f/u CMP for K and bilirubin  . Hypercholesterolemia   . Hypertension   . Inflammatory liver disease 11/21/2014  . Macrocytic anemia   . Maxillary sinus fracture ([place]) 04/05/2015  . Osteoarthritis of right hip 11/10/2015  . Polyarthritis of multiple sites   . Pure hypercholesterolemia 10/19/2014   Overview:  LDL 206 per records 03/2014. Not on statin due to liver disease  . Seizures ([place])    pt. denies   . Thrombocytopenia ([place])   . Tremors of nervous system     Allergies Allergies  Allergen Reactions  . Ace Inhibitors Other (See Comments)    Cough with lisinopril    IV Location/Drains/Wounds Patient Lines/Drains/Airways Status   Active Line/Drains/Airways    Name:   Placement date:   Placement time:   Site:   Days:  Peripheral IV 05/03/17 Right;Upper Arm   05/03/17    0913    Arm   20   Peripheral IV 05/23/17 Left Forearm   05/23/17    1843    Forearm   less than 1   Incision (Closed) 09/22/16 Hip Right   09/22/16    1032     243   Pressure Injury 05/07/17 Stage II -  Partial thickness loss of dermis presenting as a shallow open ulcer with a red, pink wound bed without slough.   05/07/17    0900     16   Pressure  Injury 05/07/17 Stage II -  Partial thickness loss of dermis presenting as a shallow open ulcer with a red, pink wound bed without slough.   05/07/17    0900     16          Labs/Imaging Results for orders placed or performed during the hospital encounter of 05/23/17 (from the past 48 hour(s))  I-Stat Chem 8, ED     Status: Abnormal   Collection Time: 05/23/17  6:25 PM  Result Value Ref Range   Sodium 126 (L) 135 - 145 mmol/L   Potassium 4.4 3.5 - 5.1 mmol/L   Chloride 96 (L) 101 - 111 mmol/L   BUN 54 (H) 6 - 20 mg/dL   Creatinine, Ser 2.20 (H) 0.61 - 1.24 mg/dL   Glucose, Bld 95 65 - 99 mg/dL   Calcium, Ion 0.88 (LL) 1.15 - 1.40 mmol/L   TCO2 18 (L) 22 - 32 mmol/L   Hemoglobin 15.0 13.0 - 17.0 g/dL   HCT 44.0 39.0 - 52.0 %   Comment NOTIFIED PHYSICIAN   I-Stat CG4 Lactic Acid, ED     Status: Abnormal   Collection Time: 05/23/17  6:25 PM  Result Value Ref Range   Lactic Acid, Venous 3.68 (HH) 0.5 - 1.9 mmol/L   Comment NOTIFIED PHYSICIAN   Comprehensive metabolic panel     Status: Abnormal   Collection Time: 05/23/17  6:38 PM  Result Value Ref Range   Sodium 126 (L) 135 - 145 mmol/L   Potassium 4.3 3.5 - 5.1 mmol/L   Chloride 93 (L) 101 - 111 mmol/L   CO2 21 (L) 22 - 32 mmol/L   Glucose, Bld 103 (H) 65 - 99 mg/dL   BUN 64 (H) 6 - 20 mg/dL   Creatinine, Ser 1.93 (H) 0.61 - 1.24 mg/dL   Calcium 8.2 (L) 8.9 - 10.3 mg/dL   Total Protein 6.2 (L) 6.5 - 8.1 g/dL   Albumin 1.9 (L) 3.5 - 5.0 g/dL   AST 260 (H) 15 - 41 U/L   ALT 158 (H) 17 - 63 U/L   Alkaline Phosphatase 218 (H) 38 - 126 U/L   Total Bilirubin 24.3 (HH) 0.3 - 1.2 mg/dL    Comment: CRITICAL RESULT CALLED TO, READ BACK BY AND VERIFIED WITH: DOWSTER,T RN [id number] COV[place],N    GFR calc non Af Amer 34 (L) >60 mL/min   GFR calc Af Amer 40 (L) >60 mL/min    Comment: (NOTE) The eGFR has been calculated using the CKD EPI equation. This calculation has not been validated in all clinical situations. eGFR's  persistently <60 mL/min signify possible Chronic Kidney Disease.    Anion gap 12 5 - 15    Comment: Performed at [facility], [place] [address] [place], [place] [address]  CBC     Status: Abnormal   Collection Time: 05/23/17  6:38  PM  Result Value Ref Range   WBC 16.2 (H) 4.0 - 10.5 K/uL   RBC 3.46 (L) 4.22 - 5.81 MIL/uL   Hemoglobin 13.2 13.0 - 17.0 g/dL   HCT 36.3 (L) 39.0 - 52.0 %   MCV 104.9 (H) 78.0 - 100.0 fL   MCH 38.2 (H) 26.0 - 34.0 pg   MCHC 36.4 (H) 30.0 - 36.0 g/dL   RDW 15.6 (H) 11.5 - 15.5 %   Platelets 110 (L) 150 - 400 K/uL    Comment: SPECIMEN CHECKED FOR CLOTS REPEATED TO VERIFY PLATELET COUNT CONFIRMED BY SMEAR Performed at [facility] [address], [place], [place] [address]   Ammonia     Status: Abnormal   Collection Time: 05/23/17  6:38 PM  Result Value Ref Range   Ammonia 39 (H) 9 - 35 umol/L    Comment: Performed at [facility], [place] [address] [place], [place] [address]  Ethanol     Status: None   Collection Time: 05/23/17  6:38 PM  Result Value Ref Range   Alcohol, Ethyl (B) <10 <10 mg/dL    Comment:        LOWEST DETECTABLE LIMIT FOR SERUM ALCOHOL IS 10 mg/dL FOR MEDICAL PURPOSES ONLY Performed at [facility] [address], [place], [place] [address]   Blood gas, venous     Status: Abnormal   Collection Time: 05/23/17  6:38 PM  Result Value Ref Range   pH, Ven 7.399 7.250 - 7.430   pCO2, Ven 35.6 (L) 44.0 - 60.0 mmHg   pO2, Ven  32.0 - 45.0 mmHg    CRITICAL RESULT CALLED TO, READ BACK BY AND VERIFIED WITH:    Comment: dan floyd md by angie dunlap rrt rcp on 05/23/17 at 1843   Bicarbonate 21.5 20.0 - 28.0 mmol/L   Acid-base deficit 2.2 (H) 0.0 - 2.0 mmol/L   O2 Saturation 36.5 %   Patient temperature 98.6    Collection site VEIN    Drawn by [id number]    Sample type VENOUS     Comment: Performed at [facility], [place] [address]  [place], [place] [address]  Protime-INR     Status: Abnormal   Collection Time: 05/23/17  8:01 PM  Result Value Ref Range   Prothrombin Time 22.8 (H) 11.4 - 15.2 seconds   INR 2.03     Comment: Performed at [facility], [place] [address], [place], [place] [address]  Urinalysis, Routine w reflex microscopic     Status: Abnormal   Collection Time: 05/23/17  8:15 PM  Result Value Ref Range   Color, Urine AMBER (A) YELLOW    Comment: BIOCHEMICALS MAY BE AFFECTED BY COLOR   APPearance HAZY (A) CLEAR   Specific Gravity, Urine 1.015 1.005 - 1.030   pH 5.0 5.0 - 8.0   Glucose, UA NEGATIVE NEGATIVE mg/dL   Hgb urine dipstick SMALL (A) NEGATIVE   Bilirubin Urine MODERATE (A) NEGATIVE   Ketones, ur NEGATIVE NEGATIVE mg/dL   Protein, ur NEGATIVE NEGATIVE mg/dL   Nitrite NEGATIVE NEGATIVE   Leukocytes, UA MODERATE (A) NEGATIVE   RBC / HPF 0-5 0 - 5 RBC/hpf   WBC, UA TOO NUMEROUS TO COUNT 0 - 5 WBC/hpf   Bacteria, UA MANY (A) NONE SEEN   Squamous Epithelial / LPF 0-5 (A) NONE SEEN   WBC Clumps PRESENT     Comment: Performed at [facility], [address]  [place], [place] [address]  Sodium, urine, random     Status: None   Collection Time: 05/23/17  8:15 PM  Result Value Ref Range   Sodium, Ur <10 mmol/L    Comment: Performed at [facility], [place] [address], [place], [place] [address]  Urine rapid drug screen (hosp performed)     Status: None   Collection Time: 05/23/17  8:15 PM  Result Value Ref Range   Opiates NONE DETECTED NONE DETECTED   Cocaine NONE DETECTED NONE DETECTED   Benzodiazepines NONE DETECTED NONE DETECTED   Amphetamines NONE DETECTED NONE DETECTED   Tetrahydrocannabinol NONE DETECTED NONE DETECTED   Barbiturates NONE DETECTED NONE DETECTED    Comment: (NOTE) DRUG SCREEN FOR MEDICAL PURPOSES ONLY.  IF CONFIRMATION IS NEEDED FOR ANY PURPOSE, NOTIFY LAB WITHIN 5 DAYS. LOWEST DETECTABLE LIMITS FOR URINE DRUG SCREEN Drug  Class                     Cutoff (ng/mL) Amphetamine and metabolites    1000 Barbiturate and metabolites    200 Benzodiazepine                 789 Tricyclics and metabolites     300 Opiates and metabolites        300 Cocaine and metabolites        300 THC                            50 Performed at [facility], [place] [address] [place], [place] [address]    Dg Chest 2 View  Result Date: 05/23/2017 CLINICAL DATA:  Has not eaten for 2 days, jaundiced, history CHF, hypertension EXAM: CHEST - 2 VIEW COMPARISON:  05/03/2017 FINDINGS: Mild elevation of RIGHT diaphragm. Enlargement of cardiac silhouette. Mediastinal contours and pulmonary vascularity normal. No gross infiltrate, pleural effusion or pneumothorax. Bones unremarkable. IMPRESSION: Mild enlargement of cardiac silhouette. No acute abnormalities. Electronically Signed   By: Lavonia Dana M.D.   On: 05/23/2017 17:34   Ct Head Wo Contrast  Result Date: 05/23/2017 CLINICAL DATA:  Altered level of consciousness. EXAM: CT HEAD WITHOUT CONTRAST TECHNIQUE: Contiguous axial images were obtained from the base of the skull through the vertex without intravenous contrast. COMPARISON:  06/15/2015 FINDINGS: Brain: There is no evidence for acute hemorrhage, hydrocephalus, mass lesion, or abnormal extra-axial fluid collection. No definite CT evidence for acute infarction. Diffuse loss of parenchymal volume is consistent with atrophy. Patchy low attenuation in the deep hemispheric and periventricular white matter is nonspecific, but likely reflects chronic microvascular ischemic demyelination. Age indeterminate lacunar infarct noted right basal ganglia. Vascular: Atherosclerotic calcification of the carotid siphons noted at the skull base. No dense MCA sign. Skull: No evidence for fracture. No worrisome lytic or sclerotic lesion. Sinuses/Orbits: The visualized paranasal sinuses and mastoid air cells are clear. Visualized portions of the globes  and intraorbital fat are unremarkable. Other: None. IMPRESSION: 1. No acute intracranial abnormality. 2. Atrophy with chronic small vessel white matter ischemic disease. Electronically Signed   By: Misty Stanley M.D.   On: 05/23/2017 18:08    Pending Labs Unresulted Labs (From admission, onward)   Start     Ordered   05/24/17 0500  Protime-INR  Tomorrow morning,   R     05/23/17 2157   Signed and Held  Magnesium  Tomorrow morning,   R    Comments:  Call MD if <1.5  Signed and Held   Signed and Held  Phosphorus  Tomorrow morning,   R     Signed and Held   Signed and Held  TSH  Once,   R    Comments:  Cancel if already done within 1 month and notify MD    Signed and Held   Signed and Held  Comprehensive metabolic panel  Once,   R    Comments:  Cal MD for K<3.5 or >5.0    Signed and Held   Signed and Held  CBC  Once,   R    Comments:  Call for hg <8.0    Signed and Held   Signed and Held  Prealbumin  Tomorrow morning,   R     Signed and Held      Vitals/Pain Today's Vitals   05/23/17 2145 05/23/17 2200 05/23/17 2215 05/23/17 2230  BP:  (!) 122/92  112/73  Pulse: 79 (!) 136 83 (!) 111  Resp: _0 Temp:      TempSrc:      SpO2: 99% 97% 97% 95%  PainSc:        Isolation Precautions No active isolations  Medications Medications  lactulose (CHRONULAC) 10 GM/15ML solution 30 g (has no administration in time range)  albumin human 25 % solution 12.5 g (has no administration in time range)  sodium chloride 0.9 % bolus 1,000 mL (0 mLs Intravenous Stopped 05/23/17 2233)    Mobility walks with person assist

## 2017-05-23 NOTE — ED Provider Notes (Signed)
[facility]-EMERGENCY DEPT Provider Note   CSN: [id number] Arrival date & time: 05/23/17  1547     History   Chief Complaint Chief Complaint  Patient presents with  . Failure To Thrive    HPI Gary [name] is a 67 y.o. male.  67 yo M with a chief complaint of altered mental status.  Per the wife he is not been talking today.  He has a history of alcoholic cirrhosis and she thinks he has been taking his medicines but has not been taking a medicine that makes him have bowel movements.  She denies fevers or chills he has had frequent falls over the past couple days.  She is unsure if he had a head injury.  Level 5 caveat altered mental status  The history is provided by the patient.  Illness  This is a new problem. The current episode started 2 days ago. The problem occurs constantly. The problem has not changed since onset.Pertinent negatives include no chest pain, no abdominal pain, no headaches and no shortness of breath. Nothing aggravates the symptoms. Nothing relieves the symptoms. He has tried nothing for the symptoms. The treatment provided no relief.    Past Medical History:  Diagnosis Date  . Alcohol abuse   . Alcoholic cirrhosis (HCC)    Overview:  Hx of encephalopathy  . Alcoholic encephalopathy (HCC) 6/80/8811  . Alcoholic hepatitis without ascites 02/25/2014  . Altered mental status 06/15/2015  . Arthritis of right hip 09/12/2016  . Ascites 04/25/2014  . CHF (congestive heart failure) (HCC)   . Chronic pain of right hip 11/09/2015  . Esophageal reflux   . Essential hypertension 11/10/2014   Overview:  Was on lisinopril 20 mg daily per records  . GERD (gastroesophageal reflux disease)   . History of hiatal hernia   . Hyperbilirubinemia 03/01/2014   Overview:  Last Assessment & Plan:  Likely due to liver cirrhosis, ascites has macrocytic anemia, thrombocytopenia, hepatic encephalopathy/hyperammonemia - CT abdomen showed hepatic cirrhosis with  degenerative nodules - Ultrasound showed severe hepatic steatosis, trace amount of perisplenic ascites - on steroids for acute alcoholic hepatitis, patient has GI appointment for further steroid taper outpatient , continue current dose for 28 days. - AFP 2.4 - Placed on propranolol, Aldactone 50 mg daily. Follow potassium level. SNF - cont propranolol and aldactone; f/u CMP for K and bilirubin  . Hypercholesterolemia   . Hypertension   . Inflammatory liver disease 11/21/2014  . Macrocytic anemia   . Maxillary sinus fracture (HCC) 04/05/2015  . Osteoarthritis of right hip 11/10/2015  . Polyarthritis of multiple sites   . Pure hypercholesterolemia 10/19/2014   Overview:  LDL 206 per records 03/2014. Not on statin due to liver disease  . Seizures (HCC)    pt. denies   . Thrombocytopenia (HCC)   . Tremors of nervous system     Patient Active Problem List   Diagnosis Date Noted  . Acute hepatic encephalopathy 05/23/2017  . Leukocytosis 05/23/2017  . Pressure injury of skin 05/07/2017  . Junctional rhythm 05/06/2017  . Jaundice   . Encephalopathy, hepatic (HCC)   . AKI (acute kidney injury) (HCC) 05/03/2017  . Primary localized osteoarthritis of right hip 09/22/2016  . Abnormal EKG 09/17/2016  . Left anterior fascicular block (LAFB) 09/17/2016  . Cardiomyopathy (HCC) 09/17/2016  . Preoperative cardiovascular examination 09/17/2016  . Ectopic atrial rhythm 09/17/2016  . Arthritis of right hip 09/12/2016    Class: Chronic  . Osteoarthritis of right hip  11/10/2015  . Chronic pain of right hip 11/09/2015  . Alcoholic encephalopathy (HCC) 16/12/9602  . Vitamin D deficiency 06/28/2015  . Abnormal echocardiography   . Alcoholic hepatitis   . Altered mental status 06/15/2015  . Hepatic cirrhosis (HCC) 06/15/2015  . Elevated troponin 06/15/2015  . Elevated CK 06/15/2015  . Fall 06/15/2015  . Maxillary sinus fracture (HCC) 04/05/2015  . Elevated LFTs 11/21/2014  . Inflammatory liver disease  11/21/2014  . Alcohol abuse 11/21/2014  . Hypokalemia   . Hypomagnesemia   . Acute encephalopathy   . Increased liver enzymes   . Hypertensive heart disease 11/10/2014  . Pure hypercholesterolemia 10/19/2014  . Polyarthritis of multiple sites   . Esophageal reflux   . Alcoholic cirrhosis (HCC)   . Shortness of breath 04/25/2014  . History of alcohol abuse 04/25/2014  . Lower extremity edema 04/25/2014  . Ascites 04/25/2014  . Rash 04/25/2014  . Left ankle pain 04/25/2014  . Hyponatremia   . Hyperbilirubinemia 03/01/2014  . Macrocytic anemia 03/01/2014  . Thrombocytopenia (HCC) 03/01/2014  . Alcohol dependence (HCC) 02/28/2014  . Alcoholic hepatitis without ascites 02/25/2014  . Acute hepatitis 02/25/2014    Past Surgical History:  Procedure Laterality Date  . TONSILLECTOMY    . TOTAL HIP ARTHROPLASTY Right 09/22/2016   Procedure: TOTAL HIP ARTHROPLASTY ANTERIOR APPROACH;  Surgeon: Gean Birchwood, MD;  Location: [facility] OR;  Service: Orthopedics;  Laterality: Right;  . WISDOM TOOTH EXTRACTION          Home Medications    Prior to Admission medications   Medication Sig Start Date End Date Taking? Authorizing Provider  aspirin EC 325 MG tablet Take 1 tablet (325 mg total) by mouth 2 (two) times daily. Patient taking differently: Take 325 mg by mouth daily.  09/22/16  Yes Allena Katz, PA-C  folic acid (FOLVITE) 1 MG tablet Take 1 mg by mouth daily.   Yes [provider]  furosemide (LASIX) 40 MG tablet Take 0.5 tablets (20 mg total) by mouth daily. Resume when Sodium is >132 05/17/17  Yes Sheikh, Omair Latif, DO  hydroxypropyl methylcellulose / hypromellose (ISOPTO TEARS / GONIOVISC) 2.5 % ophthalmic solution Place 1 drop into both eyes 4 (four) times daily as needed for dry eyes. 06/20/15  Yes Rai, Ripudeep K, MD  lactulose (CHRONULAC) 10 GM/15ML solution Take 45 mLs (30 g total) by mouth 3 (three) times daily. 05/14/17  Yes Sheikh, Omair Latif, DO  liver oil-zinc oxide  (DESITIN) 40 % ointment Apply topically 2 (two) times daily. 05/14/17  Yes Sheikh, Omair Latif, DO  metoprolol tartrate (LOPRESSOR) 25 MG tablet Take 0.5 tablets (12.5 mg total) by mouth 3 (three) times daily. 05/14/17  Yes Sheikh, Omair Latif, DO  mirtazapine (REMERON) 15 MG tablet Take 1 tablet (15 mg total) by mouth at bedtime. 05/14/17  Yes Sheikh, Omair Latif, DO  nystatin ointment (MYCOSTATIN) Apply topically 2 (two) times daily. 05/14/17  Yes Sheikh, Omair Latif, DO  Omega-3 Fatty Acids (FISH OIL) 500 MG CAPS Take 500 mg by mouth daily.   Yes [provider]  pantoprazole (PROTONIX) 40 MG tablet Take 1 tablet (40 mg total) by mouth daily. Patient taking differently: Take 40 mg by mouth daily before breakfast.  04/26/14  Yes Vassie Loll, MD  prednisoLONE 5 MG TABS tablet Take 8 tablets (40 mg total) by mouth daily. 05/15/17  Yes Sheikh, Omair Latif, DO  rifaximin (XIFAXAN) 550 MG TABS tablet Take 1 tablet (550 mg total) by mouth 2 (two) times  daily. 05/14/17  Yes Sheikh, Omair Latif, DO  spironolactone (ALDACTONE) 50 MG tablet Take 0.5 tablets (25 mg total) by mouth daily. Resume when Sodium is >132. 05/17/17  Yes Sheikh, Omair Latif, DO  thiamine 100 MG tablet Take 1 tablet (100 mg total) by mouth daily. 05/15/17  Yes Sheikh, Omair Latif, DO  tiZANidine (ZANAFLEX) 2 MG tablet Take 1 tablet (2 mg total) by mouth every 6 (six) hours as needed for muscle spasms. 09/22/16  Yes Allena Katz, PA-C  triamcinolone ointment (KENALOG) 0.1 % Apply 1 application topically 2 (two) times daily. 02/13/17  Yes [provider]  Multiple Vitamin (MULTIVITAMIN WITH MINERALS) TABS tablet Take 1 tablet by mouth daily. Patient not taking: Reported on 05/03/2017 03/04/14   Rodolph Bong, MD    Family History Family History  Problem Relation Age of Onset  . Heart failure Father   . Alcohol abuse Father   . Heart attack Father   . COPD Mother   . Colon cancer Neg Hx   . Colon polyps Neg Hx     . Diabetes Neg Hx   . Kidney disease Neg Hx   . Gallbladder disease Neg Hx   . Esophageal cancer Neg Hx     Social History Social History   Tobacco Use  . Smoking status: Never Smoker  . Smokeless tobacco: Never Used  Substance Use Topics  . Alcohol use: Yes    Alcohol/week: 4.8 - 7.8 oz    Types: 7 - 12 Cans of beer, 1 Shots of liquor per week    Comment: daily  . Drug use: No     Allergies   Ace inhibitors   Review of Systems Review of Systems  Constitutional: Positive for activity change. Negative for chills and fever.  HENT: Negative for congestion and facial swelling.   Eyes: Negative for discharge and visual disturbance.  Respiratory: Negative for shortness of breath.   Cardiovascular: Negative for chest pain and palpitations.  Gastrointestinal: Positive for abdominal distention. Negative for abdominal pain, anal bleeding, diarrhea and vomiting.  Musculoskeletal: Negative for arthralgias and myalgias.  Skin: Negative for color change and rash.  Neurological: Negative for tremors, syncope and headaches.  Psychiatric/Behavioral: Negative for confusion and dysphoric mood.     Physical Exam Updated Vital Signs BP 125/75 (BP Location: Left Arm)   Pulse 84   Temp 98.2 F (36.8 C) (Oral)   Resp 16   SpO2 96%   Physical Exam  Constitutional: He appears well-developed and well-nourished.  Jaundiced  HENT:  Head: Normocephalic and atraumatic.  Eyes: Pupils are equal, round, and reactive to light. EOM are normal.  Neck: Normal range of motion. Neck supple. No JVD present.  Cardiovascular: Normal rate and regular rhythm. Exam reveals no gallop and no friction rub.  No murmur heard. Pulmonary/Chest: No respiratory distress. He has no wheezes.  Abdominal: He exhibits distension (with fluid wave). He exhibits no mass. There is tenderness. There is no rebound and no guarding.  Musculoskeletal: Normal range of motion.  Neurological: He is alert. GCS eye subscore is  4. GCS verbal subscore is 2. GCS motor subscore is 5.  Skin: No rash noted. No pallor.  Psychiatric: He has a normal mood and affect. His behavior is normal.  Nursing note and vitals reviewed.    ED Treatments / Results  Labs (all labs ordered are listed, but only abnormal results are displayed) Labs Reviewed  COMPREHENSIVE METABOLIC PANEL - Abnormal; Notable for the following components:  Result Value   Sodium 126 (*)    Chloride 93 (*)    CO2 21 (*)    Glucose, Bld 103 (*)    BUN 64 (*)    Creatinine, Ser 1.93 (*)    Calcium 8.2 (*)    Total Protein 6.2 (*)    Albumin 1.9 (*)    AST 260 (*)    ALT 158 (*)    Alkaline Phosphatase 218 (*)    Total Bilirubin 24.3 (*)    GFR calc non Af Amer 34 (*)    GFR calc Af Amer 40 (*)    All other components within normal limits  CBC - Abnormal; Notable for the following components:   WBC 16.2 (*)    RBC 3.46 (*)    HCT 36.3 (*)    MCV 104.9 (*)    MCH 38.2 (*)    MCHC 36.4 (*)    RDW 15.6 (*)    Platelets 110 (*)    All other components within normal limits  AMMONIA - Abnormal; Notable for the following components:   Ammonia 39 (*)    All other components within normal limits  BLOOD GAS, VENOUS - Abnormal; Notable for the following components:   pCO2, Ven 35.6 (*)    Acid-base deficit 2.2 (*)    All other components within normal limits  PROTIME-INR - Abnormal; Notable for the following components:   Prothrombin Time 22.8 (*)    All other components within normal limits  I-STAT CHEM 8, ED - Abnormal; Notable for the following components:   Sodium 126 (*)    Chloride 96 (*)    BUN 54 (*)    Creatinine, Ser 2.20 (*)    Calcium, Ion 0.88 (*)    TCO2 18 (*)    All other components within normal limits  I-STAT CG4 LACTIC ACID, ED - Abnormal; Notable for the following components:   Lactic Acid, Venous 3.68 (*)    All other components within normal limits  SODIUM, URINE, RANDOM  URINALYSIS, ROUTINE W REFLEX MICROSCOPIC    ETHANOL  RAPID URINE DRUG SCREEN, HOSP PERFORMED    EKG None  Radiology Dg Chest 2 View  Result Date: 05/23/2017 CLINICAL DATA:  Has not eaten for 2 days, jaundiced, history CHF, hypertension EXAM: CHEST - 2 VIEW COMPARISON:  05/03/2017 FINDINGS: Mild elevation of RIGHT diaphragm. Enlargement of cardiac silhouette. Mediastinal contours and pulmonary vascularity normal. No gross infiltrate, pleural effusion or pneumothorax. Bones unremarkable. IMPRESSION: Mild enlargement of cardiac silhouette. No acute abnormalities. Electronically Signed   By: Ulyses Southward M.D.   On: 05/23/2017 17:34   Ct Head Wo Contrast  Result Date: 05/23/2017 CLINICAL DATA:  Altered level of consciousness. EXAM: CT HEAD WITHOUT CONTRAST TECHNIQUE: Contiguous axial images were obtained from the base of the skull through the vertex without intravenous contrast. COMPARISON:  06/15/2015 FINDINGS: Brain: There is no evidence for acute hemorrhage, hydrocephalus, mass lesion, or abnormal extra-axial fluid collection. No definite CT evidence for acute infarction. Diffuse loss of parenchymal volume is consistent with atrophy. Patchy low attenuation in the deep hemispheric and periventricular white matter is nonspecific, but likely reflects chronic microvascular ischemic demyelination. Age indeterminate lacunar infarct noted right basal ganglia. Vascular: Atherosclerotic calcification of the carotid siphons noted at the skull base. No dense MCA sign. Skull: No evidence for fracture. No worrisome lytic or sclerotic lesion. Sinuses/Orbits: The visualized paranasal sinuses and mastoid air cells are clear. Visualized portions of the globes and intraorbital fat are  unremarkable. Other: None. IMPRESSION: 1. No acute intracranial abnormality. 2. Atrophy with chronic small vessel white matter ischemic disease. Electronically Signed   By: Kennith Center M.D.   On: 05/23/2017 18:08    Procedures Procedures (including critical care  time) Procedure note: Ultrasound Guided Peripheral IV Ultrasound guided peripheral 1.88 inch angiocath IV placement performed by me. Indications: Nursing unable to place IV. Details: The antecubital fossa and upper arm were evaluated with a multifrequency linear probe. Patent brachial veins were noted. 1 attempt was made to cannulate a vein under realtime US guidance with successful cannulation of the vein and catheter placement. There is return of non-pulsatile dark red blood. The patient tolerated the procedure well without complications. Images archived electronically.  CPT codes: 95284 and [phone]  Medications Ordered in ED Medications  sodium chloride 0.9 % bolus 1,000 mL (1,000 mLs Intravenous New Bag/Given 05/23/17 1906)     Initial Impression / Assessment and Plan / ED Course  I have reviewed the triage vital signs and the nursing notes.  Pertinent labs & imaging results that were available during my care of the patient were reviewed by me and considered in my medical decision making (see chart for details).     67 yo M with a chief complaint of altered mental status.  The patient has a history of alcoholic cirrhosis.  Does not sound like he has been taking lactulose at home.  Will obtain a laboratory evaluation CT of the head with recurrent falls.  Patient's mental status improved while in the ED.  Not complaining of any specific thing.  He feels weak.  Denies abdominal pain denies fevers denies vomiting.  He is unsure about the medication that he takes to have bowel movements.  Patient found to have acute kidney injury.  We will give a liter of fluids.  Discussed with hospitalist for admission.  The patients results and plan were reviewed and discussed.   Any x-rays performed were independently reviewed by myself.   Differential diagnosis were considered with the presenting HPI.  Medications  sodium chloride 0.9 % bolus 1,000 mL (1,000 mLs Intravenous New Bag/Given 05/23/17 1906)     Vitals:   05/23/17 1626 05/23/17 1901 05/23/17 1906  BP: 107/68 92/74 125/75  Pulse: 96 88 84  Resp: 16 20 16   Temp: 98.2 F (36.8 C)    TempSrc: Oral    SpO2: 94% 100% 96%    Final diagnoses:  AKI (acute kidney injury) (HCC)  Dehydration  Disorientation    Admission/ observation were discussed with the admitting physician, patient and/or family and they are comfortable with the plan.    Final Clinical Impressions(s) / ED Diagnoses   Final diagnoses:  AKI (acute kidney injury) [facility])  Dehydration  Disorientation    ED Discharge Orders    None       Melene Plan, DO 05/23/17 2037

## 2017-05-23 NOTE — ED Triage Notes (Signed)
Pt wife states the pt has not eaten for the past 2 days. Pt appears jaundiced. Pt is not speaking in triage. Pt's wife states the pt has not talking to her today.

## 2017-05-23 NOTE — ED Notes (Addendum)
Date and time results received: 05/23/17 1915   Test:Total Bilirubin Critical Value: 24.3  Name of Provider Notified: Dr. Edward Jolly  Orders Received? Or Actions Taken?:

## 2017-05-23 NOTE — ED Notes (Signed)
Patient transported to CT 

## 2017-05-24 ENCOUNTER — Other Ambulatory Visit: Payer: Self-pay

## 2017-05-24 ENCOUNTER — Inpatient Hospital Stay (HOSPITAL_COMMUNITY): Payer: Medicare HMO

## 2017-05-24 DIAGNOSIS — I428 Other cardiomyopathies: Secondary | ICD-10-CM

## 2017-05-24 DIAGNOSIS — N390 Urinary tract infection, site not specified: Secondary | ICD-10-CM | POA: Diagnosis present

## 2017-05-24 DIAGNOSIS — K7011 Alcoholic hepatitis with ascites: Secondary | ICD-10-CM

## 2017-05-24 DIAGNOSIS — R9431 Abnormal electrocardiogram [ECG] [EKG]: Secondary | ICD-10-CM | POA: Diagnosis present

## 2017-05-24 LAB — CBC
HCT: 34.3 % — ABNORMAL LOW (ref 39.0–52.0)
Hemoglobin: 12.3 g/dL — ABNORMAL LOW (ref 13.0–17.0)
MCH: 38 pg — AB (ref 26.0–34.0)
MCHC: 35.9 g/dL (ref 30.0–36.0)
MCV: 105.9 fL — ABNORMAL HIGH (ref 78.0–100.0)
PLATELETS: 98 10*3/uL — AB (ref 150–400)
RBC: 3.24 MIL/uL — AB (ref 4.22–5.81)
RDW: 15.7 % — ABNORMAL HIGH (ref 11.5–15.5)
WBC: 14.1 10*3/uL — ABNORMAL HIGH (ref 4.0–10.5)

## 2017-05-24 LAB — COMPREHENSIVE METABOLIC PANEL
ALT: 129 U/L — AB (ref 17–63)
AST: 225 U/L — ABNORMAL HIGH (ref 15–41)
Albumin: 1.6 g/dL — ABNORMAL LOW (ref 3.5–5.0)
Alkaline Phosphatase: 187 U/L — ABNORMAL HIGH (ref 38–126)
Anion gap: 12 (ref 5–15)
BUN: 59 mg/dL — ABNORMAL HIGH (ref 6–20)
CALCIUM: 7.8 mg/dL — AB (ref 8.9–10.3)
CO2: 19 mmol/L — AB (ref 22–32)
CREATININE: 1.59 mg/dL — AB (ref 0.61–1.24)
Chloride: 95 mmol/L — ABNORMAL LOW (ref 101–111)
GFR calc non Af Amer: 43 mL/min — ABNORMAL LOW (ref >60)
GFR, EST AFRICAN AMERICAN: 50 mL/min — AB (ref >60)
GLUCOSE: 93 mg/dL (ref 65–99)
Potassium: 4.1 mmol/L (ref 3.5–5.1)
SODIUM: 126 mmol/L — AB (ref 135–145)
Total Bilirubin: 20.4 mg/dL (ref 0.3–1.2)
Total Protein: 5.1 g/dL — ABNORMAL LOW (ref 6.5–8.1)

## 2017-05-24 LAB — ECHOCARDIOGRAM COMPLETE
Height: 70 in
Weight: 3671.9818 oz

## 2017-05-24 LAB — PROTIME-INR
INR: 2.24
Prothrombin Time: 24.6 seconds — ABNORMAL HIGH (ref 11.4–15.2)

## 2017-05-24 LAB — PREALBUMIN

## 2017-05-24 LAB — TSH: TSH: 0.77 u[IU]/mL (ref 0.350–4.500)

## 2017-05-24 LAB — MRSA PCR SCREENING: MRSA BY PCR: NEGATIVE

## 2017-05-24 LAB — MAGNESIUM: Magnesium: 2.3 mg/dL (ref 1.7–2.4)

## 2017-05-24 LAB — PHOSPHORUS: Phosphorus: 4.4 mg/dL (ref 2.5–4.6)

## 2017-05-24 MED ORDER — ALBUMIN HUMAN 25 % IV SOLN
12.5000 g | Freq: Once | INTRAVENOUS | Status: AC
  Administered 2017-05-25: 12.5 g via INTRAVENOUS
  Filled 2017-05-24 (×2): qty 50

## 2017-05-24 MED ORDER — PERFLUTREN LIPID MICROSPHERE
1.0000 mL | INTRAVENOUS | Status: AC | PRN
  Administered 2017-05-24: 3 mL via INTRAVENOUS
  Filled 2017-05-24: qty 10

## 2017-05-24 NOTE — Progress Notes (Signed)
PROGRESS NOTE  LUQMAN PERRELLI ZOX:[id number] DOB: Jan 15, 1951 DOA: 05/23/2017 PCP: [name], NP  HPI/Recap of past 24 hours:  Patient is jaundice , confused, denies pain, no fever  Dark urine in foley  Assessment/Plan: Active Problems:   Thrombocytopenia (HCC)   Hyponatremia   History of alcohol abuse   Ascites   Hepatic cirrhosis (HCC)   AKI (acute kidney injury) (HCC)   Acute hepatic encephalopathy   Leukocytosis   Hepatic encephalopathy (HCC)   Left leg cellulitis   UTI (urinary tract infection)   Prolonged QT interval  Hepatic encephalopathy:  ? Uti, on rocephin, lactulose, rifaximin  Alcohol cirrhosis, ascites, generalized edema, thrombocytopenia, hyponatremia, elevated inr On prednisone Paracentesis tomorrow Poor prognosis Held family meeting with son and palliative care together Then family meeting with wife Need to continue goals of care discussion   AKI/ckdII Cr 0.8 on 3/14, cr 2.2 on presentation From uti? From hepatorenal ?  Diastolic chf   Code Status: full  Family Communication: patient , wife and son  Disposition Plan: pending snf vs hospice   Consultants:  Palliative care  Procedures:  paracentesis  Antibiotics:  rocephin   Objective: BP 111/73 (BP Location: Left Arm)   Pulse 98   Temp 97.8 F (36.6 C) (Axillary)   Resp 14   Ht 5\' 10"  (1.778 m)   Wt 104.1 kg (229 lb 8 oz)   SpO2 98%   BMI 32.93 kg/m   Intake/Output Summary (Last 24 hours) at 05/24/2017 8119 Last data filed at 05/24/2017 0400 Gross per 24 hour  Intake 1368.75 ml  Output -  Net 1368.75 ml   Filed Weights   05/23/17 2353  Weight: 104.1 kg (229 lb 8 oz)    Exam: Patient is examined daily including today on 05/24/2017, exams remain the same as of yesterday except that has changed    General:  Confused, jaundice  Cardiovascular: RRR  Respiratory: diminished , no wheezing  Abdomen: distended, nontender, + bs  Musculoskeletal: generalized  dema  Neuro: alert, oriented to person   Data Reviewed: Basic Metabolic Panel: Recent Labs  Lab 05/23/17 1825 05/23/17 1838 05/24/17 0552  NA 126* 126* 126*  K 4.4 4.3 4.1  CL 96* 93* 95*  CO2  --  21* 19*  GLUCOSE 95 103* 93  BUN 54* 64* 59*  CREATININE 2.20* 1.93* 1.59*  CALCIUM  --  8.2* 7.8*  MG  --   --  2.3  PHOS  --   --  4.4   Liver Function Tests: Recent Labs  Lab 05/23/17 1838 05/24/17 0552  AST 260* 225*  ALT 158* 129*  ALKPHOS 218* 187*  BILITOT 24.3* 20.4*  PROT 6.2* 5.1*  ALBUMIN 1.9* 1.6*   No results for input(s): LIPASE, AMYLASE in the last 168 hours. Recent Labs  Lab 05/23/17 1838  AMMONIA 39*   CBC: Recent Labs  Lab 05/23/17 1825 05/23/17 1838 05/24/17 0552  WBC  --  16.2* 14.1*  HGB 15.0 13.2 12.3*  HCT 44.0 36.3* 34.3*  MCV  --  104.9* 105.9*  PLT  --  110* 98*   Cardiac Enzymes:   No results for input(s): CKTOTAL, CKMB, CKMBINDEX, TROPONINI in the last 168 hours. BNP (last 3 results) No results for input(s): BNP in the last 8760 hours.  ProBNP (last 3 results) No results for input(s): PROBNP in the last 8760 hours.  CBG: No results for input(s): GLUCAP in the last 168 hours.  Recent Results (from the past  240 hour(s))  MRSA PCR Screening     Status: None   Collection Time: 05/24/17  1:42 AM  Result Value Ref Range Status   MRSA by PCR NEGATIVE NEGATIVE Final    Comment:        The GeneXpert MRSA Assay (FDA approved for NASAL specimens only), is one component of a comprehensive MRSA colonization surveillance program. It is not intended to diagnose MRSA infection nor to guide or monitor treatment for MRSA infections. Performed at [facility], [address], [place], [place] [address]      Studies: Dg Chest 2 View  Result Date: 05/23/2017 CLINICAL DATA:  Has not eaten for 2 days, jaundiced, history CHF, hypertension EXAM: CHEST - 2 VIEW COMPARISON:  05/03/2017 FINDINGS: Mild elevation of RIGHT  diaphragm. Enlargement of cardiac silhouette. Mediastinal contours and pulmonary vascularity normal. No gross infiltrate, pleural effusion or pneumothorax. Bones unremarkable. IMPRESSION: Mild enlargement of cardiac silhouette. No acute abnormalities. Electronically Signed   By: Ulyses Southward M.D.   On: 05/23/2017 17:34   Ct Head Wo Contrast  Result Date: 05/23/2017 CLINICAL DATA:  Altered level of consciousness. EXAM: CT HEAD WITHOUT CONTRAST TECHNIQUE: Contiguous axial images were obtained from the base of the skull through the vertex without intravenous contrast. COMPARISON:  06/15/2015 FINDINGS: Brain: There is no evidence for acute hemorrhage, hydrocephalus, mass lesion, or abnormal extra-axial fluid collection. No definite CT evidence for acute infarction. Diffuse loss of parenchymal volume is consistent with atrophy. Patchy low attenuation in the deep hemispheric and periventricular white matter is nonspecific, but likely reflects chronic microvascular ischemic demyelination. Age indeterminate lacunar infarct noted right basal ganglia. Vascular: Atherosclerotic calcification of the carotid siphons noted at the skull base. No dense MCA sign. Skull: No evidence for fracture. No worrisome lytic or sclerotic lesion. Sinuses/Orbits: The visualized paranasal sinuses and mastoid air cells are clear. Visualized portions of the globes and intraorbital fat are unremarkable. Other: None. IMPRESSION: 1. No acute intracranial abnormality. 2. Atrophy with chronic small vessel white matter ischemic disease. Electronically Signed   By: Kennith Center M.D.   On: 05/23/2017 18:08    Scheduled Meds: . folic acid  1 mg Oral Daily  . lactulose  30 g Oral TID  . metoprolol tartrate  12.5 mg Oral TID  . pantoprazole  40 mg Oral QAC breakfast  . prednisoLONE  40 mg Oral Daily  . rifaximin  550 mg Oral BID  . thiamine  100 mg Oral Daily    Continuous Infusions: . albumin human    . cefTRIAXone (ROCEPHIN)  IV Stopped  (05/24/17 0211)     Time spent: I have personally reviewed and interpreted on  05/24/2017 daily labs, tele strips, imagings as discussed above under date review session and assessment and plans.  I reviewed all nursing notes, pharmacy notes, consultant notes,  vitals, pertinent old records  I have discussed plan of care as described above with RN , patient and family on 05/24/2017   Albertine Grates MD, PhD  [facility] Pager [phone]. If 7PM-7AM, please contact night-coverage at [email or web address], password [facility] 05/24/2017, 8:12 AM  LOS: 1 day

## 2017-05-24 NOTE — Progress Notes (Signed)
Attempted echo, patient wanted to eat.  Additionally tried in the morning but patient was being transported for other exams.  Will attempt at a later time.

## 2017-05-24 NOTE — Consult Note (Signed)
Consultation Note Date: 05/24/2017   Patient Name: Gary [name]  DOB: 15-Jan-1951  MRN: [id number]  Age / Sex: 67 y.o., male  PCP: [name], NP Referring Physician: Albertine Grates, MD  Reason for Consultation: Establishing goals of care  HPI/Patient Profile: 67 y.o. male  admitted on 05/23/2017   Clinical Assessment and Goals of Care:  67 year old gentleman known to palliative medicine service seen in consultation in last hospitalization. Past medical history significant for alcohol-induced cirrhosis of the liver. Underlying history also of gastroesophageal reflux disease, diastolic congestive heart failure, degenerative joint disease, hypertension. Patient lives at home with his wife. He has an adult son Gary [name] [phone]. In a previous hospitalization, the patient had stated that he would want his sister Gary [name] to make medical decisions for him. [name]. The patient's wife [name] is [place] and is reportedly very distraught about the patient's current condition.  Palliative team has been consultative for goals of care discussions after the patient has been readmitted to hospital medicine service with abdominal pain, worsening acute hepatic encephalopathy, also found to have thrombocytopenia, hyponatremia, acute kidney injury, ascites, left lower extremity cellulitis.  Optimal medical management is being carried out. Patient has been placed on lactulose, hydration, antibiotics.  Surface echocardiogram is also been done results pending.  The patient was seen and examined earlier today. He went for paracenteses that was not done today and is likely going to be done on 3-20 5-19. The patient was confused. He thought he was still in his house. He kept talking about how he had wasted a day. Was no family present at the bedside.  A joint family meeting was held with Dr.  Raynald Kemp from hospital medicine service, myself, the patient's son Gary [name] who stated his phone number is [phone]. [name] lives in [place]. He states that he left home at age 63. He states that the patient has struggled with alcoholism all his life. He states that his aunt the patient's [name] has basically already arranged the patient's funeral services. He talked about multiple losses there are going to was a family with the patient's impending demise looming, patient's [name] lost her husband unexpectedly a few months ago, patient's father as well as [name]'s father is in a nursing facility in a different state and continues to decline. Gary [name] stated that he would not want the patient to suffer and if he had a shortly limited prognosis he wish to seek full scope of comfort measures and residential hospice setting. Discussed gently that the patient's wife Gary [name] would be the next decision maker as the patient himself is not decisional and there is no written documentation about a healthcare power of attorney agent.  We discussed about the importance of making decisions together as a family for the patient's sake. We discussed about the time trial of current therapies for the next 24-48 hours to see if the patient wouldn't respond to lactulose to see if there could be more lucidity. Additionally, to continue with  current hospitalization, continue with paracenteses, continue with medical treatments and optimization as best as possible. Patient's son and sister know that the patient is seriously ill. Patient's wife is reportedly asking for him to be transferred to [facility] so that he can get on a transplant list. Patient has extremely low serum albumin, high INR and is showing sequelae of chronic liver disease. He is at high risk for ongoing decline in decompensation and even death. Sr. And son are fully aware. We will simply have to continue conversations keeping in mind the patient's disease  trajectory to discuss best possible goals of care, best possible disposition which is to be decided. See additional discussions/recommendations below. Thank you for the consult.  NEXT OF KIN  has a wife, has an adult son Gary [name], has a sister  SUMMARY OF RECOMMENDATIONS    full code for now. Ongoing discussions with patient's wife, son, sister about DO NOT RESUSCITATE. Monitor disease trajectory: Hope for the patient having some degree of lucidity with maintenance of current therapies for the next 24-48 hours. Monitor disease trajectory and hospital course. Will need to undertake hospice discussions - residential hospice discussions if the patient has ongoing encephalopathy. Patient has a meld score of 34 with more than 50% 3 month mortality. See above for further discussions. Thank you for the consult. Palliative to continue to follow.   Code Status/Advance Care Planning:  Full code    Symptom Management:    as above  Palliative Prophylaxis:   Aspiration  Additional Recommendations (Limitations, Scope, Preferences):  Full Scope Treatment  Psycho-social/Spiritual:   Desire for further Chaplaincy support:yes  Additional Recommendations: Education on Hospice  Prognosis:   guarded  Discharge Planning: To Be Determined      Primary Diagnoses: Present on Admission: . Acute hepatic encephalopathy . Leukocytosis . Thrombocytopenia (HCC) . Hyponatremia . Hepatic cirrhosis (HCC) . AKI (acute kidney injury) (HCC) . Hepatic encephalopathy (HCC) . Ascites . Left leg cellulitis . UTI (urinary tract infection) . Prolonged QT interval   I have reviewed the medical record, interviewed the patient and family, and examined the patient. The following aspects are pertinent.  Past Medical History:  Diagnosis Date  . Alcohol abuse   . Alcoholic cirrhosis (HCC)    Overview:  Hx of encephalopathy  . Alcoholic encephalopathy (HCC) 6/96/2952  . Alcoholic hepatitis without  ascites 02/25/2014  . Altered mental status 06/15/2015  . Arthritis of right hip 09/12/2016  . Ascites 04/25/2014  . CHF (congestive heart failure) (HCC)   . Chronic pain of right hip 11/09/2015  . Esophageal reflux   . Essential hypertension 11/10/2014   Overview:  Was on lisinopril 20 mg daily per records  . GERD (gastroesophageal reflux disease)   . History of hiatal hernia   . Hyperbilirubinemia 03/01/2014   Overview:  Last Assessment & Plan:  Likely due to liver cirrhosis, ascites has macrocytic anemia, thrombocytopenia, hepatic encephalopathy/hyperammonemia - CT abdomen showed hepatic cirrhosis with degenerative nodules - Ultrasound showed severe hepatic steatosis, trace amount of perisplenic ascites - on steroids for acute alcoholic hepatitis, patient has GI appointment for further steroid taper outpatient , continue current dose for 28 days. - AFP 2.4 - Placed on propranolol, Aldactone 50 mg daily. Follow potassium level. SNF - cont propranolol and aldactone; f/u CMP for K and bilirubin  . Hypercholesterolemia   . Hypertension   . Inflammatory liver disease 11/21/2014  . Macrocytic anemia   . Maxillary sinus fracture (HCC) 04/05/2015  . Osteoarthritis of right hip 11/10/2015  .  Polyarthritis of multiple sites   . Pure hypercholesterolemia 10/19/2014   Overview:  LDL 206 per records 03/2014. Not on statin due to liver disease  . Seizures (HCC)    pt. denies   . Thrombocytopenia (HCC)   . Tremors of nervous system    Social History   Socioeconomic History  . Marital status: Married    Spouse name: Not on file  . Number of children: 2  . Years of education: Not on file  . Highest education level: Not on file  Occupational History  . Occupation: Retired  Engineer, production  . Financial resource strain: Not on file  . Food insecurity:    Worry: Not on file    Inability: Not on file  . Transportation needs:    Medical: Not on file    Non-medical: Not on file  Tobacco Use  . Smoking  status: Never Smoker  . Smokeless tobacco: Never Used  Substance and Sexual Activity  . Alcohol use: Yes    Alcohol/week: 4.8 - 7.8 oz    Types: 7 - 12 Cans of beer, 1 Shots of liquor per week    Comment: daily  . Drug use: No  . Sexual activity: Yes  Lifestyle  . Physical activity:    Days per week: Not on file    Minutes per session: Not on file  . Stress: Not on file  Relationships  . Social connections:    Talks on phone: Not on file    Gets together: Not on file    Attends religious service: Not on file    Active member of club or organization: Not on file    Attends meetings of clubs or organizations: Not on file    Relationship status: Not on file  Other Topics Concern  . Not on file  Social History Narrative  . Not on file   Family History  Problem Relation Age of Onset  . Heart failure Father   . Alcohol abuse Father   . Heart attack Father   . COPD Mother   . Colon cancer Neg Hx   . Colon polyps Neg Hx   . Diabetes Neg Hx   . Kidney disease Neg Hx   . Gallbladder disease Neg Hx   . Esophageal cancer Neg Hx    Scheduled Meds: . folic acid  1 mg Oral Daily  . lactulose  30 g Oral TID  . metoprolol tartrate  12.5 mg Oral TID  . pantoprazole  40 mg Oral QAC breakfast  . prednisoLONE  40 mg Oral Daily  . rifaximin  550 mg Oral BID  . thiamine  100 mg Oral Daily   Continuous Infusions: . albumin human    . cefTRIAXone (ROCEPHIN)  IV Stopped (05/24/17 0211)   PRN Meds:.polyvinyl alcohol Medications Prior to Admission:  Prior to Admission medications   Medication Sig Start Date End Date Taking? Authorizing Provider  aspirin EC 325 MG tablet Take 1 tablet (325 mg total) by mouth 2 (two) times daily. Patient taking differently: Take 325 mg by mouth daily.  09/22/16  Yes Allena Katz, PA-C  folic acid (FOLVITE) 1 MG tablet Take 1 mg by mouth daily.   Yes [provider]  furosemide (LASIX) 40 MG tablet Take 0.5 tablets (20 mg total) by mouth  daily. Resume when Sodium is >132 05/17/17  Yes Sheikh, Omair Latif, DO  hydroxypropyl methylcellulose / hypromellose (ISOPTO TEARS / GONIOVISC) 2.5 % ophthalmic solution Place 1 drop into  both eyes 4 (four) times daily as needed for dry eyes. 06/20/15  Yes Rai, Ripudeep K, MD  lactulose (CHRONULAC) 10 GM/15ML solution Take 45 mLs (30 g total) by mouth 3 (three) times daily. 05/14/17  Yes Sheikh, Omair Latif, DO  liver oil-zinc oxide (DESITIN) 40 % ointment Apply topically 2 (two) times daily. 05/14/17  Yes Sheikh, Omair Latif, DO  metoprolol tartrate (LOPRESSOR) 25 MG tablet Take 0.5 tablets (12.5 mg total) by mouth 3 (three) times daily. 05/14/17  Yes Sheikh, Omair Latif, DO  mirtazapine (REMERON) 15 MG tablet Take 1 tablet (15 mg total) by mouth at bedtime. 05/14/17  Yes Sheikh, Omair Latif, DO  nystatin ointment (MYCOSTATIN) Apply topically 2 (two) times daily. 05/14/17  Yes Sheikh, Omair Latif, DO  Omega-3 Fatty Acids (FISH OIL) 500 MG CAPS Take 500 mg by mouth daily.   Yes [provider]  pantoprazole (PROTONIX) 40 MG tablet Take 1 tablet (40 mg total) by mouth daily. Patient taking differently: Take 40 mg by mouth daily before breakfast.  04/26/14  Yes Vassie Loll, MD  prednisoLONE 5 MG TABS tablet Take 8 tablets (40 mg total) by mouth daily. 05/15/17  Yes Sheikh, Omair Latif, DO  rifaximin (XIFAXAN) 550 MG TABS tablet Take 1 tablet (550 mg total) by mouth 2 (two) times daily. 05/14/17  Yes Sheikh, Omair Latif, DO  spironolactone (ALDACTONE) 50 MG tablet Take 0.5 tablets (25 mg total) by mouth daily. Resume when Sodium is >132. 05/17/17  Yes Sheikh, Omair Latif, DO  thiamine 100 MG tablet Take 1 tablet (100 mg total) by mouth daily. 05/15/17  Yes Sheikh, Omair Latif, DO  tiZANidine (ZANAFLEX) 2 MG tablet Take 1 tablet (2 mg total) by mouth every 6 (six) hours as needed for muscle spasms. 09/22/16  Yes Allena Katz, PA-C  triamcinolone ointment (KENALOG) 0.1 % Apply 1 application topically 2  (two) times daily. 02/13/17  Yes [provider]  Multiple Vitamin (MULTIVITAMIN WITH MINERALS) TABS tablet Take 1 tablet by mouth daily. Patient not taking: Reported on 05/03/2017 03/04/14   Rodolph Bong, MD   Allergies  Allergen Reactions  . Ace Inhibitors Other (See Comments)    Cough with lisinopril   Review of Systems Confused  Physical Exam Weak, chronically ill-appearing Icteric in appearance Scleral icterus Skin with multiple bruises erythema left lower extremity Shallow regular breath sounds S1-S2 Abdomen distended Neurologic: Patient is confused has asterixis  Vital Signs: BP 111/73 (BP Location: Left Arm)   Pulse 98   Temp 97.8 F (36.6 C) (Axillary)   Resp 14   Ht 5\' 10"  (1.778 m)   Wt 104.1 kg (229 lb 8 oz)   SpO2 98%   BMI 32.93 kg/m  Pain Scale: 0-10   Pain Score: Asleep   SpO2: SpO2: 98 % O2 Device:SpO2: 98 % O2 Flow Rate: .   IO: Intake/output summary:   Intake/Output Summary (Last 24 hours) at 05/24/2017 1257 Last data filed at 05/24/2017 0400 Gross per 24 hour  Intake 1368.75 ml  Output -  Net 1368.75 ml    LBM: Last BM Date: 05/24/17 Baseline Weight: Weight: 104.1 kg (229 lb 8 oz) Most recent weight: Weight: 104.1 kg (229 lb 8 oz)     Palliative Assessment/Data:   PPS 20%  Time In:  11.30   Time Out:  12.40 Time Total:  70 min  Greater than 50%  of this time was spent counseling and coordinating care related to the above assessment and plan.  Signed  by: Loistine Chance, MD  [phone]  Please contact Palliative Medicine Team phone at [phone] for questions and concerns.  For individual provider: See Shea Evans

## 2017-05-24 NOTE — Progress Notes (Signed)
  Echocardiogram 2D Echocardiogram has been performed.  Gary [name] 05/24/2017, 4:11 PM

## 2017-05-24 NOTE — Evaluation (Signed)
Physical Therapy Evaluation Patient Details Name: Gary [name] MRN: [id number] DOB: [birth year] Today's Date: 05/24/2017   History of Present Illness  Gary [name] is a 67 y.o. male with medical history significant of alcohol induced cirrhosis, arthritis, hypertension, reflux, alcohol abuse, diastolic CHF. Pt present with AMS. encephalopthy  Clinical Impression  Pt admitted with above diagnosis. Pt currently with functional limitations due to the deficits listed below (see PT Problem List). Pt will need SNF if agreeable; Pt will benefit from skilled PT to increase their independence and safety with mobility to allow discharge to the venue listed below.       Follow Up Recommendations SNF    Equipment Recommendations  None recommended by PT    Recommendations for Other Services       Precautions / Restrictions Precautions Precautions: Fall      Mobility  Bed Mobility Overal bed mobility: Needs Assistance Bed Mobility: Sidelying to Sit;Rolling Rolling: Min assist Sidelying to sit: Mod assist       General bed mobility comments: increased time, assist with trunk  Transfers Overall transfer level: Needs assistance Equipment used: Rolling walker (2 wheeled) Transfers: Sit to/from Stand Sit to Stand: Mod assist;+2 safety/equipment;Min assist;From elevated surface         General transfer comment: Assist to rise, stabilize. Increased time.     Ambulation/Gait Ambulation/Gait assistance: Min assist;+2 safety/equipment Ambulation Distance (Feet): 5 Feet Assistive device: Rolling walker (2 wheeled) Gait Pattern/deviations: Step-to pattern;Wide base of support     General Gait Details: unsteady, fatigues quickly  Stairs            Wheelchair Mobility    Modified Rankin (Stroke Patients Only)       Balance Overall balance assessment: Needs assistance Sitting-balance support: Feet supported;Single extremity supported Sitting balance-Leahy Scale: Fair      Standing balance support: Bilateral upper extremity supported Standing balance-Leahy Scale: Poor Standing balance comment: relies on BUE support                             Pertinent Vitals/Pain Pain Assessment: No/denies pain    Home Living Family/patient expects to be discharged to:: Skilled nursing facility Living Arrangements: Spouse/significant other Available Help at Discharge: Family;Available 24 hours/day Type of Home: House Home Access: Stairs to enter Entrance Stairs-Rails: None Entrance Stairs-Number of Steps: 1 Home Layout: One level Home Equipment: Walker - 2 wheels;Cane - single point;Bedside commode Additional Comments: pt has not been able to amb at home per wife, she has been feeding him    Prior Function Level of Independence: Needs assistance   Gait / Transfers Assistance Needed: not amb per wife at home, was amb last recent hospital admission           Hand Dominance        Extremity/Trunk Assessment   Upper Extremity Assessment Upper Extremity Assessment: Generalized weakness    Lower Extremity Assessment Lower Extremity Assessment: Generalized weakness(intention tremors and edema bil LEs)       Communication   Communication: No difficulties  Cognition Arousal/Alertness: Awake/alert Behavior During Therapy: Flat affect                                   General Comments: increased time to respond but A & O, verbally abusive to wife      General Comments      Exercises  Assessment/Plan    PT Assessment Patient needs continued PT services  PT Problem List Decreased strength;Decreased mobility;Decreased activity tolerance;Decreased balance;Decreased knowledge of use of DME       PT Treatment Interventions DME instruction;Therapeutic activities;Gait training;Therapeutic exercise;Patient/family education;Functional mobility training;Balance training    PT Goals (Current goals can be found in the Care  Plan section)  Acute Rehab PT Goals Patient Stated Goal: none stated PT Goal Formulation: With patient Time For Goal Achievement: 06/08/18 Potential to Achieve Goals: Fair    Frequency Min 2X/week   Barriers to discharge        Co-evaluation               AM-PAC PT "6 Clicks" Daily Activity  Outcome Measure Difficulty turning over in bed (including adjusting bedclothes, sheets and blankets)?: Unable Difficulty moving from lying on back to sitting on the side of the bed? : Unable Difficulty sitting down on and standing up from a chair with arms (e.g., wheelchair, bedside commode, etc,.)?: Unable Help needed moving to and from a bed to chair (including a wheelchair)?: A Lot Help needed walking in hospital room?: A Lot Help needed climbing 3-5 steps with a railing? : Total 6 Click Score: 8    End of Session Equipment Utilized During Treatment: Gait belt Activity Tolerance: Patient limited by fatigue;Patient tolerated treatment well Patient left: with call bell/phone within reach;in chair;with chair alarm set Nurse Communication: Mobility status PT Visit Diagnosis: Unsteadiness on feet (R26.81);Difficulty in walking, not elsewhere classified (R26.2)    Time: 8416-6063 PT Time Calculation (min) (ACUTE ONLY): 20 min   Charges:   PT Evaluation $PT Eval Low Complexity: 1 Low     PT G CodesDrucilla Chalet, PT Pager: [phone] 05/24/2017   [facility] 05/24/2017, 4:09 PM

## 2017-05-24 NOTE — Evaluation (Signed)
Occupational Therapy Evaluation Patient Details Name: Gary [name] MRN: [id number] DOB: 09-Jun-1950 Today's Date: 05/24/2017    History of Present Illness Gary [name] is a 67 y.o. male with medical history significant of alcohol induced cirrhosis, arthritis, hypertension, reflux, alcohol abuse, diastolic CHF. Pt present with AMS   Clinical Impression   Pt admitted with AMS. Pt currently with functional limitations due to the deficits listed below (see OT Problem List).  Pt will benefit from skilled OT to increase their safety and independence with ADL and functional mobility for ADL to facilitate discharge to venue listed below.      Follow Up Recommendations  SNF;Supervision/Assistance - 24 hour;Other (comment);Home health OT(depending on care at home- no family present for eval)    Equipment Recommendations  None recommended by OT    Recommendations for Other Services       Precautions / Restrictions Precautions Precautions: Fall Restrictions Weight Bearing Restrictions: No      Mobility Bed Mobility Overal bed mobility: Needs Assistance Bed Mobility: Supine to Sit;Sit to Supine     Supine to sit: Mod assist Sit to supine: Mod assist   General bed mobility comments: increased time  Transfers Overall transfer level: Needs assistance Equipment used: Rolling walker (2 wheeled) Transfers: Sit to/from Stand Sit to Stand: Mod assist;+2 safety/equipment         General transfer comment: Assist to rise, stabilize. Increased time.           ADL either performed or assessed with clinical judgement   ADL Overall ADL's : Needs assistance/impaired Eating/Feeding: Minimal assistance;Bed level   Grooming: Minimal assistance;Sitting   Upper Body Bathing: Moderate assistance;Sitting   Lower Body Bathing: Maximal assistance;+2 for physical assistance;+2 for safety/equipment;Sit to/from stand;Cueing for safety;Cueing for sequencing;Cueing for compensatory techniques    Upper Body Dressing : Moderate assistance;Sitting   Lower Body Dressing: +2 for physical assistance;Maximal assistance;Sit to/from stand;Cueing for safety;Cueing for sequencing       Toileting- Clothing Manipulation and Hygiene: Sit to/from stand;+2 for physical assistance;Maximal assistance;Cueing for sequencing;Cueing for compensatory techniques         General ADL Comments: pt performed sit to stand with OT 3 times. Pt needed less A each time.     Vision Patient Visual Report: No change from baseline              Pertinent Vitals/Pain Pain Assessment: No/denies pain     Hand Dominance     Extremity/Trunk Assessment Upper Extremity Assessment Upper Extremity Assessment: Generalized weakness           Communication Communication Communication: No difficulties   Cognition Arousal/Alertness: Awake/alert Behavior During Therapy: Flat affect Overall Cognitive Status: No family/caregiver present to determine baseline cognitive functioning                                 General Comments: increased time to respond but A & O   General Comments       Exercises     Shoulder Instructions      Home Living Family/patient expects to be discharged to:: Private residence Living Arrangements: Spouse/significant other Available Help at Discharge: Family;Available 24 hours/day Type of Home: House Home Access: Stairs to enter [employer] of Steps: 1 Entrance Stairs-Rails: None Home Layout: One level     Bathroom Shower/Tub: Producer, television/film/video: Standard     Home Equipment: Environmental consultant - 2 wheels;Cane - single point;Bedside commode  Prior Functioning/Environment Level of Independence: Independent with assistive device(s)        Comments: state he uses a walker and a cane        OT Problem List: Decreased strength;Decreased activity tolerance;Impaired balance (sitting and/or standing);Decreased safety  awareness;Decreased knowledge of use of DME or AE      OT Treatment/Interventions: Self-care/ADL training;Patient/family education;DME and/or AE instruction    OT Goals(Current goals can be found in the care plan section) Acute Rehab OT Goals Patient Stated Goal: home with wife OT Goal Formulation: With patient Time For Goal Achievement:  Potential to Achieve Goals: Good  OT Frequency: Min 2X/week   Barriers to D/C:               AM-PAC PT "6 Clicks" Daily Activity     Outcome Measure Help from another person eating meals?: A Little Help from another person taking care of personal grooming?: A Little Help from another person toileting, which includes using toliet, bedpan, or urinal?: Total Help from another person bathing (including washing, rinsing, drying)?: A Lot Help from another person to put on and taking off regular upper body clothing?: A Lot Help from another person to put on and taking off regular lower body clothing?: Total 6 Click Score: 12   End of Session Equipment Utilized During Treatment: Rolling walker Nurse Communication: Mobility status  Activity Tolerance: Patient limited by fatigue Patient left: in bed;with call bell/phone within reach;with bed alarm set  OT Visit Diagnosis: Unsteadiness on feet (R26.81);Other abnormalities of gait and mobility (R26.89);History of falling (Z91.81);Muscle weakness (generalized) (M62.81)                Time: 1610-9604 OT Time Calculation (min): 17 min Charges:  OT General Charges $OT Visit: 1 Visit OT Evaluation $OT Eval Moderate Complexity: 1 Mod G-Codes:     Lise Auer, OT [phone]  Einar Crow D 05/24/2017, 11:02 AM

## 2017-05-25 ENCOUNTER — Inpatient Hospital Stay (HOSPITAL_COMMUNITY): Payer: Medicare HMO

## 2017-05-25 DIAGNOSIS — K7030 Alcoholic cirrhosis of liver without ascites: Secondary | ICD-10-CM

## 2017-05-25 DIAGNOSIS — K7010 Alcoholic hepatitis without ascites: Secondary | ICD-10-CM

## 2017-05-25 LAB — CBC WITH DIFFERENTIAL/PLATELET
BASOS PCT: 0 %
Basophils Absolute: 0 10*3/uL (ref 0.0–0.1)
EOS ABS: 0 10*3/uL (ref 0.0–0.7)
Eosinophils Relative: 0 %
HCT: 31.7 % — ABNORMAL LOW (ref 39.0–52.0)
HEMOGLOBIN: 11.5 g/dL — AB (ref 13.0–17.0)
Lymphocytes Relative: 6 %
Lymphs Abs: 1 10*3/uL (ref 0.7–4.0)
MCH: 37.6 pg — ABNORMAL HIGH (ref 26.0–34.0)
MCHC: 36.3 g/dL — AB (ref 30.0–36.0)
MCV: 103.6 fL — ABNORMAL HIGH (ref 78.0–100.0)
MONOS PCT: 5 %
Monocytes Absolute: 1 10*3/uL (ref 0.1–1.0)
NEUTROS PCT: 89 %
Neutro Abs: 16.7 10*3/uL — ABNORMAL HIGH (ref 1.7–7.7)
Platelets: 116 10*3/uL — ABNORMAL LOW (ref 150–400)
RBC: 3.06 MIL/uL — ABNORMAL LOW (ref 4.22–5.81)
RDW: 15.8 % — ABNORMAL HIGH (ref 11.5–15.5)
WBC: 18.7 10*3/uL — ABNORMAL HIGH (ref 4.0–10.5)

## 2017-05-25 LAB — COMPREHENSIVE METABOLIC PANEL
ALBUMIN: 1.6 g/dL — AB (ref 3.5–5.0)
ALK PHOS: 198 U/L — AB (ref 38–126)
ALT: 130 U/L — ABNORMAL HIGH (ref 17–63)
ANION GAP: 10 (ref 5–15)
AST: 199 U/L — ABNORMAL HIGH (ref 15–41)
BUN: 63 mg/dL — ABNORMAL HIGH (ref 6–20)
CALCIUM: 7.8 mg/dL — AB (ref 8.9–10.3)
CO2: 19 mmol/L — AB (ref 22–32)
Chloride: 97 mmol/L — ABNORMAL LOW (ref 101–111)
Creatinine, Ser: 1.92 mg/dL — ABNORMAL HIGH (ref 0.61–1.24)
GFR calc Af Amer: 40 mL/min — ABNORMAL LOW (ref >60)
GFR calc non Af Amer: 34 mL/min — ABNORMAL LOW (ref >60)
GLUCOSE: 107 mg/dL — AB (ref 65–99)
POTASSIUM: 4.4 mmol/L (ref 3.5–5.1)
SODIUM: 126 mmol/L — AB (ref 135–145)
Total Bilirubin: 20 mg/dL (ref 0.3–1.2)
Total Protein: 5.1 g/dL — ABNORMAL LOW (ref 6.5–8.1)

## 2017-05-25 LAB — PROTIME-INR
INR: 2.34
PROTHROMBIN TIME: 25.4 s — AB (ref 11.4–15.2)

## 2017-05-25 LAB — BODY FLUID CELL COUNT WITH DIFFERENTIAL
Lymphs, Fluid: 16 %
Monocyte-Macrophage-Serous Fluid: 62 % (ref 50–90)
Neutrophil Count, Fluid: 22 % (ref 0–25)
WBC FLUID: 79 uL (ref 0–1000)

## 2017-05-25 LAB — URINE CULTURE

## 2017-05-25 LAB — LACTATE DEHYDROGENASE, PLEURAL OR PERITONEAL FLUID: LD, Fluid: 32 U/L — ABNORMAL HIGH (ref 3–23)

## 2017-05-25 LAB — AMMONIA: Ammonia: 34 umol/L (ref 9–35)

## 2017-05-25 LAB — PROTEIN, PLEURAL OR PERITONEAL FLUID: Total protein, fluid: <3 g/dL

## 2017-05-25 LAB — VITAMIN B12

## 2017-05-25 LAB — FOLATE: Folate: 19.9 ng/mL (ref >5.9)

## 2017-05-25 MED ORDER — JUVEN PO PACK
1.0000 | PACK | Freq: Two times a day (BID) | ORAL | Status: DC
Start: 2017-05-25 — End: 2017-05-29
  Administered 2017-05-25 – 2017-05-28 (×7): 1 via ORAL
  Filled 2017-05-25 (×7): qty 1

## 2017-05-25 MED ORDER — LIDOCAINE HCL 1 % IJ SOLN
INTRAMUSCULAR | Status: AC
  Filled 2017-05-25: qty 10

## 2017-05-25 NOTE — Progress Notes (Signed)
   05/25/17 1600  Clinical Encounter Type  Visited With Patient  Visit Type Initial;Spiritual support  Spiritual Encounters  Spiritual Needs Emotional   Rounding on Palliative Patients.  Patient was in the bed and healthcare provider present.  I introduced myself and he nodded.  Patient appeared to have a lot on his mind and he stated yes.  I asked if he had family and he said no, I live in [place], seemed slightly confused (I am aware he has family).  Began to share about his liver condition but then he stopped.  I waited for a few minutes and let him know that we Chaplains are here for him and if he ever wants to talk to let the nurse know.  Will follow and support as needed. Chaplain Agustin Cree

## 2017-05-25 NOTE — Procedures (Signed)
Ultrasound-guided diagnostic and therapeutic paracentesis performed yielding 3.6 liters of golden yellow fluid. No immediate complications.  A portion of the fluid was submitted to the lab for preordered studies.  Secondary to hypotension only the above amount of fluid was removed today.

## 2017-05-25 NOTE — Progress Notes (Signed)
Initial Nutrition Assessment  DOCUMENTATION CODES:   Obesity unspecified  INTERVENTION:   Provide Juven Fruit Punch BID, each serving provides 80kcal and 14g of protein (amino acids glutamine and arginine)  NUTRITION DIAGNOSIS:   Inadequate oral intake related to lethargy/confusion as evidenced by per patient/family report.  GOAL:   Patient will meet greater than or equal to 90% of their needs  MONITOR:   PO intake, Supplement acceptance, Skin, Labs, Weight trends, I & O's, GOC  REASON FOR ASSESSMENT:   Consult Assessment of nutrition requirement/status  ASSESSMENT:    67 y.o. male with a history of alcoholic cirrhosis, previously hospitalized in 2015 and again in 2017 for jaundice and alcoholic hepatitis as well as a recently 05/03/17 for the same, who presented to the ER 05/23/17 again with acute hepatic encephalopathy.  Pt in room with no family at bedside. Pt to have paracentesis today. Noted now that 3.6L was removed during procedure.  Pt unable to provide any history given confusion. No PO intakes documented in chart. Pt would benefit from Juven supplements given stage 2 pressure injuries.   Per chart review, pt with weight gain but this is most likely related to fluid gain.   Medications: Folic acid tablet daily, Protonix tablet daily, Thiamine tablet daily Labs reviewed:  Low Na Mg/Phos WNL GFR: 34  NUTRITION - FOCUSED PHYSICAL EXAM:  Nutrition focused physical exam shows no sign of depletion of muscle mass or body fat. -Depletions may be masked by edema.  Diet Order:  Diet 2 gram sodium Room service appropriate? Yes; Fluid consistency: Thin  EDUCATION NEEDS:   Not appropriate for education at this time  Skin:  Skin Assessment: Skin Integrity Issues: Skin Integrity Issues:: Stage II Stage II: buttocks  Last BM:  3/24  Height:   Ht Readings from Last 1 Encounters:  05/23/17 5\' 10"  (1.778 m)    Weight:   Wt Readings from Last 1 Encounters:   05/23/17 229 lb 8 oz (104.1 kg)    Ideal Body Weight:  75.5 kg  BMI:  Body mass index is 32.93 kg/m.  Estimated Nutritional Needs:   Kcal:  2000-2200  Protein:  85-95g  Fluid:  2L/day  Tilda Franco, MS, RD, LDN [facility] Inpatient Clinical Dietitian Pager: [phone] After Hours Pager: [phone]

## 2017-05-25 NOTE — Progress Notes (Signed)
CRITICAL VALUE ALERT  Critical Value:  Total Bili 20.0  Date & Time Notied:  05/25/2017; 6803  Provider Notified: Rana Snare, NP  Orders Received/Actions taken: awaiting orders. Will continue to monitor. Mardene Celeste I

## 2017-05-25 NOTE — Consult Note (Addendum)
Consultation  Referring Provider:   Dr. Roda Shutters Primary Care Physician:  [name], NP Primary Gastroenterologist: Dr. Leone Payor       Reason for Consultation: Acute hepatic encephalopathy             HPI:   Gary [name] is a 67 y.o. male with a history of alcoholic cirrhosis, previously hospitalized in 2015 and again in 2017 for jaundice and alcoholic hepatitis as well as a recently 05/03/17 for the same, who presented to the ER 05/23/17 again with acute hepatic encephalopathy.    Recently followed by our service during hospitalization 05/03/17-05/12/17 for severe decompensated alcoholic liver disease/cirrhosis and severe alcoholic hepatitis.  At time of discharge 05/12/17 patient was on Prednisolone day #8 with a planned 30-day course of Prednisolone at 40 mg daily then tapered over 2 weeks.  DF score at that time was 69-30-day mortality risk up to 50%.  Anticipated months of recovery from severe alcoholic hepatitis, if he survived and he could stay off of alcohol.  Also had ascites which was non-tense.  Diuretics were on hold until sodium was greater than 132 and BUN less than 25.  Discussed that restarting dose would be Aldactone 20 mg daily and Lasix 20 mg daily.  Patient was told he would eventually need an EGD which could be done outpatient for variceal screening.  Continued on Xifaxan 550 mg twice daily and Lactulose 3 times daily.  He was told best chance for recovery would be SNF placement, though he declined and wanted to return home.    Patient is currently being followed by palliative care.    Today, patient found jaundiced and confused, he is unable to elicit his history.  Per his wife she took him home and after 3 days he started to decline in health again, had multiple episodes of fecal incontinence at home and was not eating or trying to get up and walk around.  She brought him to the ER.  She does not believe he was taking any of the medications prescribed during his recent hospital  stay.  Past Medical History:  Diagnosis Date  . Alcohol abuse   . Alcoholic cirrhosis (HCC)    Overview:  Hx of encephalopathy  . Alcoholic encephalopathy (HCC) 06/09/8117  . Alcoholic hepatitis without ascites 02/25/2014  . Altered mental status 06/15/2015  . Arthritis of right hip 09/12/2016  . Ascites 04/25/2014  . CHF (congestive heart failure) (HCC)   . Chronic pain of right hip 11/09/2015  . Esophageal reflux   . Essential hypertension 11/10/2014   Overview:  Was on lisinopril 20 mg daily per records  . GERD (gastroesophageal reflux disease)   . History of hiatal hernia   . Hyperbilirubinemia 03/01/2014   Overview:  Last Assessment & Plan:  Likely due to liver cirrhosis, ascites has macrocytic anemia, thrombocytopenia, hepatic encephalopathy/hyperammonemia - CT abdomen showed hepatic cirrhosis with degenerative nodules - Ultrasound showed severe hepatic steatosis, trace amount of perisplenic ascites - on steroids for acute alcoholic hepatitis, patient has GI appointment for further steroid taper outpatient , continue current dose for 28 days. - AFP 2.4 - Placed on propranolol, Aldactone 50 mg daily. Follow potassium level. SNF - cont propranolol and aldactone; f/u CMP for K and bilirubin  . Hypercholesterolemia   . Hypertension   . Inflammatory liver disease 11/21/2014  . Macrocytic anemia   . Maxillary sinus fracture (HCC) 04/05/2015  . Osteoarthritis of right hip 11/10/2015  . Polyarthritis of multiple sites   .  Pure hypercholesterolemia 10/19/2014   Overview:  LDL 206 per records 03/2014. Not on statin due to liver disease  . Seizures (HCC)    pt. denies   . Thrombocytopenia (HCC)   . Tremors of nervous system     Past Surgical History:  Procedure Laterality Date  . TONSILLECTOMY    . TOTAL HIP ARTHROPLASTY Right 09/22/2016   Procedure: TOTAL HIP ARTHROPLASTY ANTERIOR APPROACH;  Surgeon: Gean Birchwood, MD;  Location: [facility] OR;  Service: Orthopedics;  Laterality: Right;  . WISDOM TOOTH  EXTRACTION      Family History  Problem Relation Age of Onset  . Heart failure Father   . Alcohol abuse Father   . Heart attack Father   . COPD Mother   . Colon cancer Neg Hx   . Colon polyps Neg Hx   . Diabetes Neg Hx   . Kidney disease Neg Hx   . Gallbladder disease Neg Hx   . Esophageal cancer Neg Hx     Social History   Tobacco Use  . Smoking status: Never Smoker  . Smokeless tobacco: Never Used  Substance Use Topics  . Alcohol use: Yes    Alcohol/week: 4.8 - 7.8 oz    Types: 7 - 12 Cans of beer, 1 Shots of liquor per week    Comment: daily  . Drug use: No    Prior to Admission medications   Medication Sig Start Date End Date Taking? Authorizing Provider  aspirin EC 325 MG tablet Take 1 tablet (325 mg total) by mouth 2 (two) times daily. Patient taking differently: Take 325 mg by mouth daily.  09/22/16  Yes Allena Katz, PA-C  folic acid (FOLVITE) 1 MG tablet Take 1 mg by mouth daily.   Yes [provider]  furosemide (LASIX) 40 MG tablet Take 0.5 tablets (20 mg total) by mouth daily. Resume when Sodium is >132 05/17/17  Yes Sheikh, Omair Latif, DO  hydroxypropyl methylcellulose / hypromellose (ISOPTO TEARS / GONIOVISC) 2.5 % ophthalmic solution Place 1 drop into both eyes 4 (four) times daily as needed for dry eyes. 06/20/15  Yes Rai, Ripudeep K, MD  lactulose (CHRONULAC) 10 GM/15ML solution Take 45 mLs (30 g total) by mouth 3 (three) times daily. 05/14/17  Yes Sheikh, Omair Latif, DO  liver oil-zinc oxide (DESITIN) 40 % ointment Apply topically 2 (two) times daily. 05/14/17  Yes Sheikh, Omair Latif, DO  metoprolol tartrate (LOPRESSOR) 25 MG tablet Take 0.5 tablets (12.5 mg total) by mouth 3 (three) times daily. 05/14/17  Yes Sheikh, Omair Latif, DO  mirtazapine (REMERON) 15 MG tablet Take 1 tablet (15 mg total) by mouth at bedtime. 05/14/17  Yes Sheikh, Omair Latif, DO  nystatin ointment (MYCOSTATIN) Apply topically 2 (two) times daily. 05/14/17  Yes Sheikh, Omair  Latif, DO  Omega-3 Fatty Acids (FISH OIL) 500 MG CAPS Take 500 mg by mouth daily.   Yes [provider]  pantoprazole (PROTONIX) 40 MG tablet Take 1 tablet (40 mg total) by mouth daily. Patient taking differently: Take 40 mg by mouth daily before breakfast.  04/26/14  Yes Vassie Loll, MD  prednisoLONE 5 MG TABS tablet Take 8 tablets (40 mg total) by mouth daily. 05/15/17  Yes Sheikh, Omair Latif, DO  rifaximin (XIFAXAN) 550 MG TABS tablet Take 1 tablet (550 mg total) by mouth 2 (two) times daily. 05/14/17  Yes Sheikh, Omair Latif, DO  spironolactone (ALDACTONE) 50 MG tablet Take 0.5 tablets (25 mg total) by mouth daily. Resume when Sodium  is >132. 05/17/17  Yes Sheikh, Omair Latif, DO  thiamine 100 MG tablet Take 1 tablet (100 mg total) by mouth daily. 05/15/17  Yes Sheikh, Omair Latif, DO  tiZANidine (ZANAFLEX) 2 MG tablet Take 1 tablet (2 mg total) by mouth every 6 (six) hours as needed for muscle spasms. 09/22/16  Yes Allena Katz, PA-C  triamcinolone ointment (KENALOG) 0.1 % Apply 1 application topically 2 (two) times daily. 02/13/17  Yes [provider]  Multiple Vitamin (MULTIVITAMIN WITH MINERALS) TABS tablet Take 1 tablet by mouth daily. Patient not taking: Reported on 05/03/2017 03/04/14   Rodolph Bong, MD    Current Facility-Administered Medications  Medication Dose Route Frequency Provider Last Rate Last Dose  . albumin human 25 % solution 12.5 g  12.5 g Intravenous Once Albertine Grates, MD      . cefTRIAXone (ROCEPHIN) 2 g in sodium chloride 0.9 % 100 mL IVPB  2 g Intravenous QHS Therisa Doyne, MD   Stopped at 05/24/17 2157  . folic acid (FOLVITE) tablet 1 mg  1 mg Oral Daily Doutova, Anastassia, MD   1 mg at 05/24/17 1022  . lactulose (CHRONULAC) 10 GM/15ML solution 30 g  30 g Oral TID Therisa Doyne, MD   30 g at 05/24/17 2133  . metoprolol tartrate (LOPRESSOR) tablet 12.5 mg  12.5 mg Oral TID Therisa Doyne, MD   12.5 mg at 05/24/17 2134  .  pantoprazole (PROTONIX) EC tablet 40 mg  40 mg Oral QAC breakfast Doutova, Anastassia, MD   40 mg at 05/24/17 1022  . polyvinyl alcohol (LIQUIFILM TEARS) 1.4 % ophthalmic solution 1 drop  1 drop Both Eyes QID PRN Doutova, Anastassia, MD      . prednisoLONE tablet 40 mg  40 mg Oral Daily Doutova, Anastassia, MD   40 mg at 05/24/17 1021  . rifaximin (XIFAXAN) tablet 550 mg  550 mg Oral BID Therisa Doyne, MD   550 mg at 05/24/17 2134  . thiamine (VITAMIN B-1) tablet 100 mg  100 mg Oral Daily Doutova, Anastassia, MD   100 mg at 05/24/17 1022    Allergies as of 05/23/2017 - Review Complete 05/23/2017  Allergen Reaction Noted  . Ace inhibitors Other (See Comments) 10/29/2015     Review of Systems:    Unable to obtain given AMS   Physical Exam:  Vital signs in last 24 hours: Temp:  [97.4 F (36.3 C)-98.2 F (36.8 C)] 97.5 F (36.4 C) (03/25 0536) Pulse Rate:  [63-70] 63 (03/25 0536) Resp:  [16-19] 18 (03/25 0536) BP: (93-120)/(55-80) 95/74 (03/25 0536) SpO2:  [95 %-98 %] 96 % (03/25 0536) Last BM Date: 05/24/17 General:  Confused, Jaundiced, Ill-appearing Caucasian male Head:  Normocephalic and atraumatic. Eyes:   PEERL, EOMI. +icterus Conjunctiva pink. Ears:  Normal auditory acuity. Neck:  Supple Throat: Oral cavity and pharynx without inflammation, swelling or lesion.  Lungs: Respirations even and unlabored. Diminished at bases, Lungs clear to auscultation bilaterally.   No wheezes, crackles, or rhonchi.  Heart: Normal S1, S2. No MRG. Regular rate and rhythm.  Abdomen:  Soft, mild ttp, Marked distension, +fluid wave Rectal:  Not performed.  Msk:  Symmetrical without gross deformities. Peripheral pulses intact.  Extremities:  +generalized edema, left leg erythema Neurologic:  +Asterixis, CN II-XII intact.  Skin:   Dry and intact without significant lesions or rashes. Decreased skin turgor Psychiatric: Disoriented   LAB RESULTS: Recent Labs    05/23/17 1838  05/24/17 0552 05/25/17 0528  WBC 16.2* 14.1* 18.7*  HGB 13.2 12.3* 11.5*  HCT 36.3* 34.3* 31.7*  PLT 110* 98* 116*   BMET Recent Labs    05/23/17 1838 05/24/17 0552 05/25/17 0528  NA 126* 126* 126*  K 4.3 4.1 4.4  CL 93* 95* 97*  CO2 21* 19* 19*  GLUCOSE 103* 93 107*  BUN 64* 59* 63*  CREATININE 1.93* 1.59* 1.92*  CALCIUM 8.2* 7.8* 7.8*   LFT Recent Labs    05/25/17 0528  PROT 5.1*  ALBUMIN 1.6*  AST 199*  ALT 130*  ALKPHOS 198*  BILITOT 20.0*   PT/INR Recent Labs    05/24/17 0552 05/25/17 0528  LABPROT 24.6* 25.4*  INR 2.24 2.34    STUDIES: Dg Chest 2 View  Result Date: 05/23/2017 CLINICAL DATA:  Has not eaten for 2 days, jaundiced, history CHF, hypertension EXAM: CHEST - 2 VIEW COMPARISON:  05/03/2017 FINDINGS: Mild elevation of RIGHT diaphragm. Enlargement of cardiac silhouette. Mediastinal contours and pulmonary vascularity normal. No gross infiltrate, pleural effusion or pneumothorax. Bones unremarkable. IMPRESSION: Mild enlargement of cardiac silhouette. No acute abnormalities. Electronically Signed   By: Ulyses Southward M.D.   On: 05/23/2017 17:34   Ct Head Wo Contrast  Result Date: 05/23/2017 CLINICAL DATA:  Altered level of consciousness. EXAM: CT HEAD WITHOUT CONTRAST TECHNIQUE: Contiguous axial images were obtained from the base of the skull through the vertex without intravenous contrast. COMPARISON:  06/15/2015 FINDINGS: Brain: There is no evidence for acute hemorrhage, hydrocephalus, mass lesion, or abnormal extra-axial fluid collection. No definite CT evidence for acute infarction. Diffuse loss of parenchymal volume is consistent with atrophy. Patchy low attenuation in the deep hemispheric and periventricular white matter is nonspecific, but likely reflects chronic microvascular ischemic demyelination. Age indeterminate lacunar infarct noted right basal ganglia. Vascular: Atherosclerotic calcification of the carotid siphons noted at the skull base. No  dense MCA sign. Skull: No evidence for fracture. No worrisome lytic or sclerotic lesion. Sinuses/Orbits: The visualized paranasal sinuses and mastoid air cells are clear. Visualized portions of the globes and intraorbital fat are unremarkable. Other: None. IMPRESSION: 1. No acute intracranial abnormality. 2. Atrophy with chronic small vessel white matter ischemic disease. Electronically Signed   By: Kennith Center M.D.   On: 05/23/2017 18:08    Impression / Plan:   Impression: 1.  Acute hepatic encephalopathy: Continues  2.  Leukocytosis: likely related to Prednisolone 3.  Thrombocytopenia 4.  Hyponatremia 5.  Alcoholic hepatitis/cirrhosis: DF score currently 84.4-30-day mortality risk is up to 50%, MELD 36 6.  AKI versus hepatorenal syndrome: Creatinine 0.8 on 3/14, 2.2 at presentation, currently 1.92 7.  Ascites  Plan: 1.  Continue current medications and supportive measures 2.  Patient is currently on Prednisolone 40mg  qd, though unsure if patient took medicine over the past week since discharge 3.  Palliative care has been consulted and family is trying to come to an agreement regarding patient care going forward 4.  Creatinine 0.8 on 3/14 and up to 2.2 on presentation, could consider hepatorenal syndrome versus UTI? 5.  Please await further recommendations from Dr. Christella Hartigan later today regarding this complicated patient with poor prognosis.  Thank you for your kind consultation, we will continue to follow.  Violet Baldy [facility]  05/25/2017, 8:53 AM Pager #: [phone]

## 2017-05-25 NOTE — Progress Notes (Signed)
PROGRESS NOTE  SIRIUS WOODFORD ZOX:[id number] DOB: 10-Oct-1950 DOA: 05/23/2017 PCP: [name], NP  HPI/Recap of past 24 hours:  Patient is jaundice , edematous, confused, denies pain, no fever    Assessment/Plan: Active Problems:   Thrombocytopenia (HCC)   Hyponatremia   History of alcohol abuse   Ascites   Hepatic cirrhosis (HCC)   AKI (acute kidney injury) (HCC)   Acute hepatic encephalopathy   Leukocytosis   Hepatic encephalopathy (HCC)   Left leg cellulitis   UTI (urinary tract infection)   Prolonged QT interval  Hepatic encephalopathy:  Urine with multiple species, on rocephin, lactulose, rifaximin  Alcohol cirrhosis, ascites, generalized edema, thrombocytopenia, hyponatremia, elevated inr On prednisone Paracentesis with fluids study today Poor prognosis Gi and palliative care consulted Need to continue goals of care discussion   AKI/ckdII Cr 0.8 on 3/14, cr 2.2 on presentation Urine culture with multiple species  From hepatorenal ?  Diastolic chf   Code Status: full  Family Communication: patient   Disposition Plan: pending snf vs hospice   Consultants:  Palliative care  gi  Procedures:  Paracentesis on 3/25  Antibiotics:  rocephin   Objective: BP (!) 87/53 (BP Location: Left Arm)   Pulse 65   Temp (!) 97.5 F (36.4 C) (Oral)   Resp 16   Ht 5\' 10"  (1.778 m)   Wt 104.1 kg (229 lb 8 oz)   SpO2 100%   BMI 32.93 kg/m   Intake/Output Summary (Last 24 hours) at 05/25/2017 2010 Last data filed at 05/25/2017 0600 Gross per 24 hour  Intake 340 ml  Output 300 ml  Net 40 ml   Filed Weights   05/23/17 2353  Weight: 104.1 kg (229 lb 8 oz)    Exam: Patient is examined daily including today on 05/25/2017, exams remain the same as of yesterday except that has changed    General:  Confused, jaundice  Cardiovascular: RRR  Respiratory: diminished , no wheezing  Abdomen: distended, nontender, + bs  Musculoskeletal: generalized  dema  Neuro: alert, oriented to person   Data Reviewed: Basic Metabolic Panel: Recent Labs  Lab 05/23/17 1825 05/23/17 1838 05/24/17 0552 05/25/17 0528  NA 126* 126* 126* 126*  K 4.4 4.3 4.1 4.4  CL 96* 93* 95* 97*  CO2  --  21* 19* 19*  GLUCOSE 95 103* 93 107*  BUN 54* 64* 59* 63*  CREATININE 2.20* 1.93* 1.59* 1.92*  CALCIUM  --  8.2* 7.8* 7.8*  MG  --   --  2.3  --   PHOS  --   --  4.4  --    Liver Function Tests: Recent Labs  Lab 05/23/17 1838 05/24/17 0552 05/25/17 0528  AST 260* 225* 199*  ALT 158* 129* 130*  ALKPHOS 218* 187* 198*  BILITOT 24.3* 20.4* 20.0*  PROT 6.2* 5.1* 5.1*  ALBUMIN 1.9* 1.6* 1.6*   No results for input(s): LIPASE, AMYLASE in the last 168 hours. Recent Labs  Lab 05/23/17 1838 05/25/17 0528  AMMONIA 39* 34   CBC: Recent Labs  Lab 05/23/17 1825 05/23/17 1838 05/24/17 0552 05/25/17 0528  WBC  --  16.2* 14.1* 18.7*  NEUTROABS  --   --   --  16.7*  HGB 15.0 13.2 12.3* 11.5*  HCT 44.0 36.3* 34.3* 31.7*  MCV  --  104.9* 105.9* 103.6*  PLT  --  110* 98* 116*   Cardiac Enzymes:   No results for input(s): CKTOTAL, CKMB, CKMBINDEX, TROPONINI in the last 168  hours. BNP (last 3 results) No results for input(s): BNP in the last 8760 hours.  ProBNP (last 3 results) No results for input(s): PROBNP in the last 8760 hours.  CBG: No results for input(s): GLUCAP in the last 168 hours.  Recent Results (from the past 240 hour(s))  MRSA PCR Screening     Status: None   Collection Time: 05/24/17  1:42 AM  Result Value Ref Range Status   MRSA by PCR NEGATIVE NEGATIVE Final    Comment:        The GeneXpert MRSA Assay (FDA approved for NASAL specimens only), is one component of a comprehensive MRSA colonization surveillance program. It is not intended to diagnose MRSA infection nor to guide or monitor treatment for MRSA infections. Performed at [facility], [address] [place], [place] [address]   Urine  Culture     Status: Abnormal   Collection Time: 05/24/17  4:49 AM  Result Value Ref Range Status   Specimen Description   Final    URINE, RANDOM Performed at [facility], [address], [place], [place] [address]    Special Requests   Final    NONE Performed at [facility], [address], [place], [place] [address]    Culture MULTIPLE SPECIES PRESENT, SUGGEST RECOLLECTION (A)  Final   Report Status 05/25/2017 FINAL  Final     Studies: US Paracentesis  Result Date: 05/25/2017 INDICATION: Alcoholic hepatitis, cirrhosis, recurrent ascites. Request made for diagnostic and therapeutic paracentesis up to 5 liters. EXAM: ULTRASOUND GUIDED DIAGNOSTIC AND THERAPEUTIC PARACENTESIS MEDICATIONS: None COMPLICATIONS: None immediate. PROCEDURE: Informed written consent was obtained from the patient after a discussion of the risks, benefits and alternatives to treatment. A timeout was performed prior to the initiation of the procedure. Initial ultrasound scanning demonstrates a moderate to large amount of ascites within the left mid to lower abdominal quadrant. The left mid to lower abdomen was prepped and draped in the usual sterile fashion. 1% lidocaine was used for local anesthesia. Following this, a 19 gauge, 7-cm, Yueh catheter was introduced. An ultrasound image was saved for documentation purposes. The paracentesis was performed. The catheter was removed and a dressing was applied. The patient tolerated the procedure well without immediate post procedural complication. FINDINGS: A total of approximately 3.6 liters of golden yellow fluid was removed. Samples were sent to the laboratory as requested by the clinical team. Due to hypotension only the above amount of fluid was removed today. IMPRESSION: Successful ultrasound-guided diagnostic and therapeutic paracentesis yielding 3.6 liters of peritoneal fluid. Read by: Jeananne Rama, PA-C Electronically Signed   By:  Jolaine Click M.D.   On: 05/25/2017 12:17    Scheduled Meds: . folic acid  1 mg Oral Daily  . lactulose  30 g Oral TID  . lidocaine      . metoprolol tartrate  12.5 mg Oral TID  . nutrition supplement (JUVEN)  1 packet Oral BID BM  . pantoprazole  40 mg Oral QAC breakfast  . prednisoLONE  40 mg Oral Daily  . rifaximin  550 mg Oral BID  . thiamine  100 mg Oral Daily    Continuous Infusions: . cefTRIAXone (ROCEPHIN)  IV Stopped (05/24/17 2157)     Time spent: , case discussed with gi. I have personally reviewed and interpreted on  05/25/2017 daily labs, tele strips, imagings as discussed above under date review session and assessment and plans.  I reviewed all nursing notes, pharmacy notes,  consultant notes,  vitals, pertinent old records  I have discussed plan of care as described above with RN , patient  on 05/25/2017   Albertine Grates MD, PhD  [facility] Pager [phone]. If 7PM-7AM, please contact night-coverage at [email or web address], password [facility] 05/25/2017, 8:10 PM  LOS: 2 days

## 2017-05-25 NOTE — Progress Notes (Signed)
Daily Progress Note   Patient Name: Gary [name]       Date: 05/25/2017 DOB: [birth year]  Age: 67 y.o. MRN#: [id number] Attending Physician: Albertine Grates, MD Primary Care Physician: [name], NP Admit Date: 05/23/2017  Reason for Consultation/Follow-up: Establishing goals of care  Subjective:  patient is angry, his wife was at the bedside, but she left shortly after we arrived with her friend.  Patient does not wish to discuss further.  See below   Length of Stay: 2  Current Medications: Scheduled Meds:  . folic acid  1 mg Oral Daily  . lactulose  30 g Oral TID  . lidocaine      . metoprolol tartrate  12.5 mg Oral TID  . nutrition supplement (JUVEN)  1 packet Oral BID BM  . pantoprazole  40 mg Oral QAC breakfast  . prednisoLONE  40 mg Oral Daily  . rifaximin  550 mg Oral BID  . thiamine  100 mg Oral Daily    Continuous Infusions: . cefTRIAXone (ROCEPHIN)  IV Stopped (05/24/17 2157)    PRN Meds: polyvinyl alcohol  Physical Exam         Awake confused angry abd  distended Regular breathing, no labored breathing Trace edema  Vital Signs: BP 101/70   Pulse 62   Temp (!) 97.5 F (36.4 C) (Oral)   Resp 18   Ht 5\' 10"  (1.778 m)   Wt 104.1 kg (229 lb 8 oz)   SpO2 96%   BMI 32.93 kg/m  SpO2: SpO2: 96 % O2 Device: O2 Device: Room Air O2 Flow Rate:    Intake/output summary:   Intake/Output Summary (Last 24 hours) at 05/25/2017 1619 Last data filed at 05/25/2017 0600 Gross per 24 hour  Intake 340 ml  Output 300 ml  Net 40 ml   LBM: Last BM Date: 05/24/17 Baseline Weight: Weight: 104.1 kg (229 lb 8 oz) Most recent weight: Weight: 104.1 kg (229 lb 8 oz)       Palliative Assessment/Data:    Flowsheet Rows     Most Recent Value  Intake Tab  Referral  Department  Hospitalist  Unit at Time of Referral  Med/Surg Unit  Palliative Care Primary Diagnosis  Other (Comment) [Acute hepatic encephalopathy]  Date Notified  05/23/17  Palliative Care Type  Return patient  Palliative Care  Reason for referral  Clarify Goals of Care  Date of Admission  05/23/17  Date first seen by Palliative Care  05/24/17  # of days Palliative referral response time  1 Day(s)  # of days IP prior to Palliative referral  0  Clinical Assessment  Psychosocial & Spiritual Assessment  Palliative Care Outcomes      Patient Active Problem List   Diagnosis Date Noted  . UTI (urinary tract infection) 05/24/2017  . Prolonged QT interval 05/24/2017  . Acute hepatic encephalopathy 05/23/2017  . Leukocytosis 05/23/2017  . Hepatic encephalopathy (HCC) 05/23/2017  . Left leg cellulitis 05/23/2017  . Pressure injury of skin 05/07/2017  . Junctional rhythm 05/06/2017  . Jaundice   . Encephalopathy, hepatic (HCC)   . AKI (acute kidney injury) (HCC) 05/03/2017  . Primary localized osteoarthritis of right hip 09/22/2016  . Abnormal EKG 09/17/2016  . Left anterior fascicular block (LAFB) 09/17/2016  . Cardiomyopathy (HCC) 09/17/2016  . Preoperative cardiovascular examination 09/17/2016  . Ectopic atrial rhythm 09/17/2016  . Arthritis of right hip 09/12/2016    Class: Chronic  . Osteoarthritis of right hip 11/10/2015  . Chronic pain of right hip 11/09/2015  . Alcoholic encephalopathy (HCC) 76/19/5093  . Vitamin D deficiency 06/28/2015  . Abnormal echocardiography   . Alcoholic hepatitis   . Altered mental status 06/15/2015  . Hepatic cirrhosis (HCC) 06/15/2015  . Elevated troponin 06/15/2015  . Elevated CK 06/15/2015  . Fall 06/15/2015  . Maxillary sinus fracture (HCC) 04/05/2015  . Elevated LFTs 11/21/2014  . Inflammatory liver disease 11/21/2014  . Alcohol abuse 11/21/2014  . Hypokalemia   . Hypomagnesemia   . Acute encephalopathy   . Increased liver enzymes     . Hypertensive heart disease 11/10/2014  . Pure hypercholesterolemia 10/19/2014  . Polyarthritis of multiple sites   . Esophageal reflux   . Alcoholic cirrhosis (HCC)   . Shortness of breath 04/25/2014  . History of alcohol abuse 04/25/2014  . Lower extremity edema 04/25/2014  . Ascites 04/25/2014  . Rash 04/25/2014  . Left ankle pain 04/25/2014  . Hyponatremia   . Hyperbilirubinemia 03/01/2014  . Macrocytic anemia 03/01/2014  . Thrombocytopenia (HCC) 03/01/2014  . Alcohol dependence (HCC) 02/28/2014  . Alcoholic hepatitis without ascites 02/25/2014  . Acute hepatitis 02/25/2014    Palliative Care Assessment & Plan   Patient Profile:    Assessment:  liver failure ETOH hepatitis High MELD score Ascites Jaundice  Goals of care discussions  Recommendations/Plan:   PMT following, ongoing discussions about goals of care while monitoring disease trajectory. Prognosis remains guarded, may need residential hospice towards the end of this hospitalization.Continue current mode of care.     Code Status:    Code Status Orders  (From admission, onward)        Start     Ordered   05/23/17 2348  Full code  Continuous     05/23/17 2347    Code Status History    Date Active Date Inactive Code Status Order ID Comments User Context   05/03/2017 1424 05/14/2017 2228 Full Code [id number]  Haydee Salter, MD ED   09/22/2016 1238 09/23/2016 1926 Full Code [id number]  Allena Katz, PA-C Inpatient   06/15/2015 0515 06/20/2015 1618 Full Code [id number]  Alberteen Sam, MD Inpatient   04/06/2015 0052 04/08/2015 1736 Full Code [id number]  Violeta Gelinas, MD Inpatient   11/10/2014 2055 11/13/2014 1712 Full Code [id number]  Maretta Bees, MD Inpatient   04/25/2014  1245 04/26/2014 1731 Full Code [id number]  Clydia Llano, MD ED   02/25/2014 2030 03/04/2014 2012 Full Code [id number]  Dorothea Ogle, MD Inpatient    Advance Directive Documentation     Most Recent Value  Type of Advance  Directive  Living will, Healthcare Power of Attorney  Pre-existing out of facility DNR order (yellow form or pink MOST form)  -  "MOST" Form in Place?  -       Prognosis:   guarded   Discharge Planning:  To Be Determined  Care plan was discussed with patient wife     Thank you for allowing the Palliative Medicine Team to assist in the care of this patient.   Time In: 0900 Time Out: 0925 Total Time 25 Prolonged Time Billed  no       Greater than 50%  of this time was spent counseling and coordinating care related to the above assessment and plan.  Rosalin Hawking, MD [phone]  Please contact Palliative Medicine Team phone at [phone] for questions and concerns.

## 2017-05-25 NOTE — Progress Notes (Signed)
Occupational Therapy Treatment Patient Details Name: SABATINO WILLIARD MRN: [id number] DOB: [birth year] Today's Date: 05/25/2017    History of present illness ESTON HESLIN is a 67 y.o. male with medical history significant of alcohol induced cirrhosis, arthritis, hypertension, reflux, alcohol abuse, diastolic CHF. Pt present with AMS. encephalopthy   OT comments  Pt agreed to OOB.  Pt with more participation than yesterday.  Follow Up Recommendations  SNF(depending on care at home- no family present for eval)    Equipment Recommendations  None recommended by OT    Recommendations for Other Services      Precautions / Restrictions Precautions Precautions: Fall Restrictions Weight Bearing Restrictions: No       Mobility Bed Mobility Overal bed mobility: Needs Assistance Bed Mobility: Rolling;Supine to Sit Rolling: Min assist   Supine to sit: Mod assist;Max assist     General bed mobility comments: increased time, assist with trunk  Transfers Overall transfer level: Needs assistance Equipment used: Rolling walker (2 wheeled) Transfers: Sit to/from [employer] Sit to Stand: Mod assist;Min assist;From elevated surface Stand pivot transfers: Min assist;Mod assist       General transfer comment: Assist to rise, stabilize. Increased time.       Balance Overall balance assessment: Needs assistance Sitting-balance support: Feet supported;Single extremity supported Sitting balance-Leahy Scale: Fair     Standing balance support: Bilateral upper extremity supported Standing balance-Leahy Scale: Poor Standing balance comment: relies on BUE support                           ADL either performed or assessed with clinical judgement   ADL Overall ADL's : Needs assistance/impaired Eating/Feeding: Sitting;Set up   Grooming: Set up;Sitting                   Toilet Transfer: Moderate assistance;Stand-pivot;RW;Cueing for sequencing;Cueing for  safety Toilet Transfer Details (indicate cue type and reason): bed to chair Toileting- Clothing Manipulation and Hygiene: Moderate assistance;Sit to/from stand;Cueing for safety;Cueing for sequencing               Vision Patient Visual Report: No change from baseline            Cognition Arousal/Alertness: Awake/alert Behavior During Therapy: WFL for tasks assessed/performed Overall Cognitive Status: Within Functional Limits for tasks assessed                                                     Pertinent Vitals/ Pain       Pain Assessment: No/denies pain     Prior Functioning/Environment              Frequency  Min 2X/week        Progress Toward Goals  OT Goals(current goals can now be found in the care plan section)  Progress towards OT goals: Progressing toward goals     Plan Discharge plan needs to be updated    Co-evaluation                 AM-PAC PT "6 Clicks" Daily Activity     Outcome Measure   Help from another person eating meals?: A Little Help from another person taking care of personal grooming?: A Little Help from another person toileting, which includes using toliet, bedpan, or urinal?: A Lot  Help from another person bathing (including washing, rinsing, drying)?: A Lot Help from another person to put on and taking off regular upper body clothing?: A Lot Help from another person to put on and taking off regular lower body clothing?: A Lot 6 Click Score: 14    End of Session Equipment Utilized During Treatment: Rolling walker  OT Visit Diagnosis: Unsteadiness on feet (R26.81);Other abnormalities of gait and mobility (R26.89);History of falling (Z91.81);Muscle weakness (generalized) (M62.81)   Activity Tolerance Patient tolerated treatment well   Patient Left with call bell/phone within reach;with bed alarm set;in chair   Nurse Communication Mobility status        Time: 7482-7078 OT Time Calculation  (min): 11 min  Charges: OT General Charges $OT Visit: 1 Visit OT Treatments $Self Care/Home Management : 8-22 mins  [place], [place] [phone]   Alba Cory 05/25/2017, 10:50 AM

## 2017-05-26 DIAGNOSIS — Z515 Encounter for palliative care: Secondary | ICD-10-CM

## 2017-05-26 DIAGNOSIS — Z7189 Other specified counseling: Secondary | ICD-10-CM

## 2017-05-26 LAB — CBC
HCT: 33 % — ABNORMAL LOW (ref 39.0–52.0)
Hemoglobin: 12 g/dL — ABNORMAL LOW (ref 13.0–17.0)
MCH: 37.6 pg — ABNORMAL HIGH (ref 26.0–34.0)
MCHC: 36.4 g/dL — ABNORMAL HIGH (ref 30.0–36.0)
MCV: 103.4 fL — ABNORMAL HIGH (ref 78.0–100.0)
PLATELETS: 112 10*3/uL — AB (ref 150–400)
RBC: 3.19 MIL/uL — AB (ref 4.22–5.81)
RDW: 15.2 % (ref 11.5–15.5)
WBC: 18.7 10*3/uL — AB (ref 4.0–10.5)

## 2017-05-26 LAB — COMPREHENSIVE METABOLIC PANEL
ALT: 129 U/L — AB (ref 17–63)
AST: 181 U/L — ABNORMAL HIGH (ref 15–41)
Albumin: 1.7 g/dL — ABNORMAL LOW (ref 3.5–5.0)
Alkaline Phosphatase: 213 U/L — ABNORMAL HIGH (ref 38–126)
Anion gap: 9 (ref 5–15)
BUN: 66 mg/dL — AB (ref 6–20)
CHLORIDE: 98 mmol/L — AB (ref 101–111)
CO2: 19 mmol/L — ABNORMAL LOW (ref 22–32)
CREATININE: 1.99 mg/dL — AB (ref 0.61–1.24)
Calcium: 7.9 mg/dL — ABNORMAL LOW (ref 8.9–10.3)
GFR calc Af Amer: 38 mL/min — ABNORMAL LOW (ref >60)
GFR calc non Af Amer: 33 mL/min — ABNORMAL LOW (ref >60)
Glucose, Bld: 129 mg/dL — ABNORMAL HIGH (ref 65–99)
Potassium: 4.4 mmol/L (ref 3.5–5.1)
Sodium: 126 mmol/L — ABNORMAL LOW (ref 135–145)
Total Bilirubin: 20.3 mg/dL (ref 0.3–1.2)
Total Protein: 5.2 g/dL — ABNORMAL LOW (ref 6.5–8.1)

## 2017-05-26 LAB — PH, BODY FLUID: pH, Body Fluid: 7.6

## 2017-05-26 MED ORDER — AMOXICILLIN-POT CLAVULANATE 500-125 MG PO TABS
1.0000 | ORAL_TABLET | Freq: Two times a day (BID) | ORAL | Status: AC
  Administered 2017-05-26 – 2017-05-28 (×4): 500 mg via ORAL
  Filled 2017-05-26 (×4): qty 1

## 2017-05-26 NOTE — Plan of Care (Signed)
  Problem: Education: Goal: Knowledge of General Education information will improve Outcome: Progressing   Problem: Clinical Measurements: Goal: Will remain free from infection Outcome: Progressing Note:  Afebrile. On IV abx.  Goal: Diagnostic test results will improve Outcome: Progressing Goal: Respiratory complications will improve Outcome: Progressing Goal: Cardiovascular complication will be avoided Outcome: Progressing   Problem: Activity: Goal: Risk for activity intolerance will decrease Outcome: Progressing   Problem: Nutrition: Goal: Adequate nutrition will be maintained Outcome: Progressing   Problem: Coping: Goal: Level of anxiety will decrease Outcome: Progressing   Problem: Elimination: Goal: Will not experience complications related to bowel motility Outcome: Progressing Goal: Will not experience complications related to urinary retention Outcome: Progressing   Problem: Safety: Goal: Ability to remain free from injury will improve Outcome: Progressing   Problem: Skin Integrity: Goal: Risk for impaired skin integrity will decrease Outcome: Progressing

## 2017-05-26 NOTE — Care Management Important Message (Signed)
Important Message  Patient Details  Name: Gary [name] MRN: [id number] Date of Birth: 1950-12-17   Medicare Important Message Given:  Yes    Caren Macadam 05/26/2017, 11:24 AMImportant Message  Patient Details  Name: Gary [name] MRN: [id number] Date of Birth: [birth year]   Medicare Important Message Given:  Yes    Caren Macadam 05/26/2017, 11:24 AM

## 2017-05-26 NOTE — Progress Notes (Signed)
PROGRESS NOTE  Gary [name] ZOX:[id number] DOB: 1950/04/21 DOA: 05/23/2017 PCP: [name], NP  HPI/Recap of past 24 hours:  Patient is jaundice , edematous,  He seems less confused today,  He talked more,  , denies pain, no fever    Assessment/Plan: Active Problems:   Thrombocytopenia (HCC)   Hyponatremia   History of alcohol abuse   Ascites   Hepatic cirrhosis (HCC)   AKI (acute kidney injury) (HCC)   Acute hepatic encephalopathy   Leukocytosis   Hepatic encephalopathy (HCC)   Left leg cellulitis   UTI (urinary tract infection)   Prolonged QT interval  Hepatic encephalopathy:  Urine with multiple species, was on rocephin, change to augmentin  to finish total of 5 days abx treatment Continue  lactulose, rifaximin  Alcohol cirrhosis, ascites, generalized edema, thrombocytopenia, hyponatremia, elevated inr On prednisone Paracentesis with 3liter removed, no sbp Poor prognosis Gi and palliative care consulted Need to continue goals of care discussion   Patient is not consistent in terms of HPOA, yesterday he told me he wants his wife quinn to be his decision maker, today he told me he wants his sister in [place] to be his Management consultant.   AKI/ckdII Cr 0.8 on 3/14, cr 2.2 on presentation Urine culture with multiple species  From hepatorenal ?  Diastolic chf, sigificant fluids overloaded, renal function worsening, not on diuretics currently   Code Status: full  Family Communication: patient   Disposition Plan: pending snf vs hospice   Consultants:  Palliative care  gi  Procedures:  Paracentesis on 3/25  Antibiotics:  Rocephin then augmentin   Objective: BP 92/76 (BP Location: Right Arm)   Pulse 68   Temp 97.9 F (36.6 C) (Oral)   Resp 18   Ht 5\' 10"  (1.778 m)   Wt 104.1 kg (229 lb 8 oz)   SpO2 98%   BMI 32.93 kg/m   Intake/Output Summary (Last 24 hours) at 05/26/2017 1752 Last data filed at 05/26/2017 1544 Gross per 24 hour    Intake 480 ml  Output 5 ml  Net 475 ml   Filed Weights   05/23/17 2353  Weight: 104.1 kg (229 lb 8 oz)    Exam: Patient is examined daily including today on 05/26/2017, exams remain the same as of yesterday except that has changed    General:  Calm, appear more alert, jaundice  Cardiovascular: RRR  Respiratory: diminished , no wheezing  Abdomen: distended, nontender, + bs  Musculoskeletal: generalized dema  Neuro: alert, oriented to person, know he is at [facility] long   Data Reviewed: Basic Metabolic Panel: Recent Labs  Lab 05/23/17 1825 05/23/17 1838 05/24/17 0552 05/25/17 0528 05/26/17 0506  NA 126* 126* 126* 126* 126*  K 4.4 4.3 4.1 4.4 4.4  CL 96* 93* 95* 97* 98*  CO2  --  21* 19* 19* 19*  GLUCOSE 95 103* 93 107* 129*  BUN 54* 64* 59* 63* 66*  CREATININE 2.20* 1.93* 1.59* 1.92* 1.99*  CALCIUM  --  8.2* 7.8* 7.8* 7.9*  MG  --   --  2.3  --   --   PHOS  --   --  4.4  --   --    Liver Function Tests: Recent Labs  Lab 05/23/17 1838 05/24/17 0552 05/25/17 0528 05/26/17 0506  AST 260* 225* 199* 181*  ALT 158* 129* 130* 129*  ALKPHOS 218* 187* 198* 213*  BILITOT 24.3* 20.4* 20.0* 20.3*  PROT 6.2* 5.1* 5.1* 5.2*  ALBUMIN  1.9* 1.6* 1.6* 1.7*   No results for input(s): LIPASE, AMYLASE in the last 168 hours. Recent Labs  Lab 05/23/17 1838 05/25/17 0528  AMMONIA 39* 34   CBC: Recent Labs  Lab 05/23/17 1825 05/23/17 1838 05/24/17 0552 05/25/17 0528 05/26/17 0506  WBC  --  16.2* 14.1* 18.7* 18.7*  NEUTROABS  --   --   --  16.7*  --   HGB 15.0 13.2 12.3* 11.5* 12.0*  HCT 44.0 36.3* 34.3* 31.7* 33.0*  MCV  --  104.9* 105.9* 103.6* 103.4*  PLT  --  110* 98* 116* 112*   Cardiac Enzymes:   No results for input(s): CKTOTAL, CKMB, CKMBINDEX, TROPONINI in the last 168 hours. BNP (last 3 results) No results for input(s): BNP in the last 8760 hours.  ProBNP (last 3 results) No results for input(s): PROBNP in the last 8760 hours.  CBG: No results  for input(s): GLUCAP in the last 168 hours.  Recent Results (from the past 240 hour(s))  MRSA PCR Screening     Status: None   Collection Time: 05/24/17  1:42 AM  Result Value Ref Range Status   MRSA by PCR NEGATIVE NEGATIVE Final    Comment:        The GeneXpert MRSA Assay (FDA approved for NASAL specimens only), is one component of a comprehensive MRSA colonization surveillance program. It is not intended to diagnose MRSA infection nor to guide or monitor treatment for MRSA infections. Performed at [facility], [address], [place], [place] [address]   Urine Culture     Status: Abnormal   Collection Time: 05/24/17  4:49 AM  Result Value Ref Range Status   Specimen Description   Final    URINE, RANDOM Performed at [facility], [address] [place], [place] [address]    Special Requests   Final    NONE Performed at [facility], [address], [place], [place] [address]    Culture MULTIPLE SPECIES PRESENT, SUGGEST RECOLLECTION (A)  Final   Report Status 05/25/2017 FINAL  Final  Body fluid culture     Status: None (Preliminary result)   Collection Time: 05/25/17 11:47 AM  Result Value Ref Range Status   Specimen Description   Final    PERITONEAL Performed at [facility], [address], [place], [place] [address]    Special Requests   Final    NONE Performed at [facility], [address], [place], [place] [address]    Gram Stain   Final    FEW WBC PRESENT, PREDOMINANTLY MONONUCLEAR NO ORGANISMS SEEN    Culture   Final    NO GROWTH < 24 HOURS Performed at [facility], [address], [place], [place] [address]    Report Status PENDING  Incomplete     Studies: No results found.  Scheduled Meds: . folic acid  1 mg Oral Daily  . lactulose  30 g Oral TID  . metoprolol tartrate  12.5 mg Oral TID  . nutrition supplement (JUVEN)  1 packet Oral BID BM   . pantoprazole  40 mg Oral QAC breakfast  . prednisoLONE  40 mg Oral Daily  . rifaximin  550 mg Oral BID  . thiamine  100 mg Oral Daily    Continuous Infusions: . cefTRIAXone (ROCEPHIN)  IV Stopped (05/25/17 2231)     Time spent: , case discussed with palliative care Dr Neale Burly  I have personally reviewed  and interpreted on  05/26/2017 daily labs, tele strips, imagings as discussed above under date review session and assessment and plans.  I reviewed all nursing notes, pharmacy notes, consultant notes,  vitals, pertinent old records  I have discussed plan of care as described above with RN , patient  on 05/26/2017   Albertine Grates MD, PhD  [facility] Pager [phone]. If 7PM-7AM, please contact night-coverage at [email or web address], password [facility] 05/26/2017, 5:52 PM  LOS: 3 days

## 2017-05-26 NOTE — Progress Notes (Signed)
Progress Note   Subjective  Chief Complaint: Liver failure due to acute alcoholic hepatitis in the setting of cirrhosis  Patient found laying in bed today, he tells me he did not get any sleep because "my wife was bitching at me all night".  He denies any new complaints or concerns and believes that his abdomen feels slightly better after having his paracentesis yesterday.   Objective   Vital signs in last 24 hours: Temp:  [97.5 F (36.4 C)-98 F (36.7 C)] 98 F (36.7 C) (03/26 0700) Pulse Rate:  [62-74] 66 (03/26 0946) Resp:  [16-20] 20 (03/26 0946) BP: (87-130)/(49-82) 97/49 (03/26 0946) SpO2:  [96 %-100 %] 96 % (03/26 0946) Last BM Date: 05/26/17 General:   Ill-appearing jaundiced male in NAD Heart:  Regular rate and rhythm; no murmurs Lungs: Respirations even and unlabored, lungs CTA bilaterally Abdomen:  Soft, mild generalized TTP, mild distention . Normal bowel sounds. Extremities: Generalized edema Neurologic:  Alert and oriented to person and place today,  grossly normal neurologically. Psych:  Cooperative.   Intake/Output this shift: Total I/O In: 120 [P.O.:120] Out: 1 [Stool:1]  Lab Results: Recent Labs    05/24/17 0552 05/25/17 0528 05/26/17 0506  WBC 14.1* 18.7* 18.7*  HGB 12.3* 11.5* 12.0*  HCT 34.3* 31.7* 33.0*  PLT 98* 116* 112*   BMET Recent Labs    05/24/17 0552 05/25/17 0528 05/26/17 0506  NA 126* 126* 126*  K 4.1 4.4 4.4  CL 95* 97* 98*  CO2 19* 19* 19*  GLUCOSE 93 107* 129*  BUN 59* 63* 66*  CREATININE 1.59* 1.92* 1.99*  CALCIUM 7.8* 7.8* 7.9*   LFT Recent Labs    05/26/17 0506  PROT 5.2*  ALBUMIN 1.7*  AST 181*  ALT 129*  ALKPHOS 213*  BILITOT 20.3*   PT/INR Recent Labs    05/24/17 0552 05/25/17 0528  LABPROT 24.6* 25.4*  INR 2.24 2.34    Studies/Results: US Paracentesis  Result Date: 05/25/2017 INDICATION: Alcoholic hepatitis, cirrhosis, recurrent ascites. Request made for diagnostic and therapeutic  paracentesis up to 5 liters. EXAM: ULTRASOUND GUIDED DIAGNOSTIC AND THERAPEUTIC PARACENTESIS MEDICATIONS: None COMPLICATIONS: None immediate. PROCEDURE: Informed written consent was obtained from the patient after a discussion of the risks, benefits and alternatives to treatment. A timeout was performed prior to the initiation of the procedure. Initial ultrasound scanning demonstrates a moderate to large amount of ascites within the left mid to lower abdominal quadrant. The left mid to lower abdomen was prepped and draped in the usual sterile fashion. 1% lidocaine was used for local anesthesia. Following this, a 19 gauge, 7-cm, Yueh catheter was introduced. An ultrasound image was saved for documentation purposes. The paracentesis was performed. The catheter was removed and a dressing was applied. The patient tolerated the procedure well without immediate post procedural complication. FINDINGS: A total of approximately 3.6 liters of golden yellow fluid was removed. Samples were sent to the laboratory as requested by the clinical team. Due to hypotension only the above amount of fluid was removed today. IMPRESSION: Successful ultrasound-guided diagnostic and therapeutic paracentesis yielding 3.6 liters of peritoneal fluid. Read by: Jeananne Rama, PA-C Electronically Signed   By: Jolaine Click M.D.   On: 05/25/2017 12:17    Assessment / Plan:   Assessment: 1.  Liver failure due to alcoholic hepatitis in the setting of cirrhosis: DF score currently 84.4, MELD 36, creatinine continues to rise overnight 1.52-->1.92-->1.99, hepatic encephalopathy is improved overnight on lactulose/Xifaxan twice daily, initial testing for SBP  from paracentesis was negative, continues on Prednisolone 2.  AK I versus hepatorenal syndrome  Plan: 1.  Continue current medications 2.  Please await further recommendations from Dr. Christella Hartigan later today.  Thank you for your kind consultation, we will continue to follow along.   LOS: 3 days    Unk Lightning  05/26/2017, 9:54 AM  Pager # [phone]

## 2017-05-27 DIAGNOSIS — R410 Disorientation, unspecified: Secondary | ICD-10-CM

## 2017-05-27 DIAGNOSIS — N179 Acute kidney failure, unspecified: Secondary | ICD-10-CM

## 2017-05-27 DIAGNOSIS — K7200 Acute and subacute hepatic failure without coma: Secondary | ICD-10-CM

## 2017-05-27 LAB — COMPREHENSIVE METABOLIC PANEL
ALT: 147 U/L — ABNORMAL HIGH (ref 17–63)
ANION GAP: 12 (ref 5–15)
AST: 192 U/L — AB (ref 15–41)
Albumin: 1.8 g/dL — ABNORMAL LOW (ref 3.5–5.0)
Alkaline Phosphatase: 239 U/L — ABNORMAL HIGH (ref 38–126)
BILIRUBIN TOTAL: 21.5 mg/dL — AB (ref 0.3–1.2)
BUN: 80 mg/dL — AB (ref 6–20)
CO2: 19 mmol/L — ABNORMAL LOW (ref 22–32)
Calcium: 8.3 mg/dL — ABNORMAL LOW (ref 8.9–10.3)
Chloride: 92 mmol/L — ABNORMAL LOW (ref 101–111)
Creatinine, Ser: 2.43 mg/dL — ABNORMAL HIGH (ref 0.61–1.24)
GFR calc Af Amer: 30 mL/min — ABNORMAL LOW (ref >60)
GFR calc non Af Amer: 26 mL/min — ABNORMAL LOW (ref >60)
Glucose, Bld: 152 mg/dL — ABNORMAL HIGH (ref 65–99)
POTASSIUM: 5.2 mmol/L — AB (ref 3.5–5.1)
Sodium: 123 mmol/L — ABNORMAL LOW (ref 135–145)
TOTAL PROTEIN: 5.6 g/dL — AB (ref 6.5–8.1)

## 2017-05-27 MED ORDER — METOPROLOL TARTRATE 25 MG PO TABS
12.5000 mg | ORAL_TABLET | Freq: Two times a day (BID) | ORAL | Status: DC
  Administered 2017-05-27 – 2017-05-28 (×3): 12.5 mg via ORAL
  Filled 2017-05-27 (×5): qty 1

## 2017-05-27 NOTE — Progress Notes (Signed)
Palliative care progress note  Reason for consult: Goals of care in light of ESLD/cirrhosis  I met today with Gary [name].  We again discussed his clinical course including the fact that he continues to grow weaker and have decreasing functional status related to his end-stage liver disease.  We also discussed the fact that there is not a long-term fixable problem and is going to continue to progress moving forward.  He states that his goal is to live as long as possible and feel as well as possible.  We talked about options moving forward including trial of rehab at skilled facility versus transitioning home.  While he ultimately wants to be at home, he would be open to rehab for a short period of time to see if he can regain functional status if it is something that would be covered by his insurance.  In the past they have denied him from going to rehab due to poor rehab potential and he reports that he is running out of money would not be able to private pay for rehab service at this time.   We also discussed that if he is going to be transitioning home, he would likely benefit from considering electing hospice benefits on discharge.  He and I discussed that the hospital can be useful as long as he is getting well enough from care he receives at the hospital to enjoy his time at home, but we have reached a point where, if his goal is to be at home, he may be better served to plan on being at home and bringing care to him at home rather repeated trips to the hospital. We discussed hospice as a tool that may be beneficial in this goal as he has reached a point where we are trying to fix problems that are not fixable.  We discussed that in light of multiple chronic medical problems that have worsened with this acute hospialization, care should be focused on interventions that are likely to allow him to achieve goal of getting back to home and spending time with family. I discussed with Gary [name]  regarding heroic interventions at the end-of-life and he agrees this is not in line with desire for a natural death or be likely to lead to getting well enough to go back home. He is in agreement with changing CODE STATUS to DO NOT RESUSCITATE.  - DNR - Gary [name] remains invested in trying to live as long as possible.  He understands that he has chronic conditions that are not going to improve and that he is approaching end of life.  Options he is considering would be a trial of rehab at skilled facility if insurance is willing to authorize this (in the past he has been declined for rehab) versus transitioning home with the support of hospice.  We discussed how hospice would be able to encourage continued quality of life which would be the best way to add time and quality to his life at this point.  He has not made a final decision regarding home hospice, he would like to speak with a representative to discuss potential for home with hospice on discharge.  Total time: 60 minutes  Greater than 50%  of this time was spent counseling and coordinating care related to the above assessment and plan. Micheline Rough, MD [place] Team [phone]

## 2017-05-27 NOTE — Plan of Care (Signed)
  Problem: Education: Goal: Knowledge of General Education information will improve Outcome: Not Progressing   Problem: Health Behavior/Discharge Planning: Goal: Ability to manage health-related needs will improve Outcome: Not Progressing   Problem: Activity: Goal: Risk for activity intolerance will decrease Outcome: Not Progressing   Problem: Elimination: Goal: Will not experience complications related to bowel motility Outcome: Not Progressing Note:  Pt with persistent diarrhea d/t lactulose.

## 2017-05-27 NOTE — Consult Note (Signed)
Consultation Note Date: 05/27/2017   Patient Name: Gary [name]  DOB: 1950/05/25  MRN: [id number]  Age / Sex: 67 y.o., male  PCP: Gary Harvey, NP Referring Physician: Donne Hazel, MD  Reason for Consultation: Establishing goals of care  HPI/Patient Profile: 67 y.o. male  admitted on 05/23/2017   Clinical Assessment and Goals of Care:  66 year old gentleman known to palliative medicine service seen in consultation in last hospitalization. Past medical history significant for alcohol-induced cirrhosis of the liver. Underlying history also of gastroesophageal reflux disease, diastolic congestive heart failure, degenerative joint disease, hypertension. Patient lives at home with his wife.   Palliative team has been consulted for goals of care discussions after the patient has been readmitted to hospital medicine service with abdominal pain, worsening acute hepatic encephalopathy.  I met today with Gary [name]. We discussed clinical course as well as wishes moving forward in regard to advanced directives.  Concepts specific to code status and rehospitalization discussed.  We discussed difference between a aggressive medical intervention path and a palliative, comfort focused care path.  Values and goals of care important to patient and family were attempted to be elicited.  Concept of Hospice and Palliative Care were discussed  Questions and concerns addressed.   PMT will continue to support holistically.  NEXT OF KIN  has a wife, has an adult son Gary [name], has a sister  SUMMARY OF RECOMMENDATIONS   Full code for now. Patient told Dr. Erlinda [name] he wanted to reconsider this.  Today he reports needing more time to think prior to making changes. Reports tired of talking today, but open for follow-up tomorrow.  Code Status/Advance Care Planning:  Full code  Palliative Prophylaxis:   Aspiration  Additional  Recommendations (Limitations, Scope, Preferences):  Full Scope Treatment  Psycho-social/Spiritual:   Desire for further Chaplaincy support:yes  Additional Recommendations: Education on Hospice  Prognosis:   guarded  Discharge Planning: To Be Determined      Primary Diagnoses: Present on Admission: . Acute hepatic encephalopathy . Leukocytosis . Thrombocytopenia ([place]) . Hyponatremia . Hepatic cirrhosis ([place]) . AKI (acute kidney injury) ([place]) . Hepatic encephalopathy ([place]) . Ascites . Left leg cellulitis . UTI (urinary tract infection) . Prolonged QT interval   I have reviewed the medical record, interviewed the patient and family, and examined the patient. The following aspects are pertinent.  Past Medical History:  Diagnosis Date  . Alcohol abuse   . Alcoholic cirrhosis (H[facility])    Overview:  Hx of encephalopathy  . Alcoholic encephalopathy ([place]) 07/19/2015  . Alcoholic hepatitis without ascites 02/25/2014  . Altered mental status 06/15/2015  . Arthritis of right hip 09/12/2016  . Ascites 04/25/2014  . CHF (congestive heart failure) ([place])   . Chronic pain of right hip 11/09/2015  . Esophageal reflux   . Essential hypertension 11/10/2014   Overview:  Was on lisinopril 20 mg daily per records  . GERD (gastroesophageal reflux disease)   . History of hiatal hernia   . Hyperbilirubinemia 03/01/2014   Overview:  Last Assessment & Plan:  Likely due to liver cirrhosis, ascites has macrocytic anemia, thrombocytopenia, hepatic encephalopathy/hyperammonemia - CT abdomen showed hepatic cirrhosis with degenerative nodules - Ultrasound showed severe hepatic steatosis, trace amount of perisplenic ascites - on steroids for acute alcoholic hepatitis, patient has GI appointment for further steroid taper outpatient , continue current dose for 28 days. - AFP 2.4 - Placed on propranolol, Aldactone 50 mg daily. Follow potassium level. SNF - cont propranolol and aldactone; f/u CMP for K and  bilirubin  . Hypercholesterolemia   . Hypertension   . Inflammatory liver disease 11/21/2014  . Macrocytic anemia   . Maxillary sinus fracture ([place]) 04/05/2015  . Osteoarthritis of right hip 11/10/2015  . Polyarthritis of multiple sites   . Pure hypercholesterolemia 10/19/2014   Overview:  LDL 206 per records 03/2014. Not on statin due to liver disease  . Seizures ([place])    pt. denies   . Thrombocytopenia ([place])   . Tremors of nervous system    Social History   Socioeconomic History  . Marital status: Married    Spouse name: Not on file  . Number of children: 2  . Years of education: Not on file  . Highest education level: Not on file  Occupational History  . Occupation: Retired  Scientific laboratory technician  . Financial resource strain: Not on file  . Food insecurity:    Worry: Not on file    Inability: Not on file  . Transportation needs:    Medical: Not on file    Non-medical: Not on file  Tobacco Use  . Smoking status: Never Smoker  . Smokeless tobacco: Never Used  Substance and Sexual Activity  . Alcohol use: Yes    Alcohol/week: 4.8 - 7.8 oz    Types: 7 - 12 Cans of beer, 1 Shots of liquor per week    Comment: daily  . Drug use: No  . Sexual activity: Yes  Lifestyle  . Physical activity:    Days per week: Not on file    Minutes per session: Not on file  . Stress: Not on file  Relationships  . Social connections:    Talks on phone: Not on file    Gets together: Not on file    Attends religious service: Not on file    Active member of club or organization: Not on file    Attends meetings of clubs or organizations: Not on file    Relationship status: Not on file  Other Topics Concern  . Not on file  Social History Narrative  . Not on file   Family History  Problem Relation Age of Onset  . Heart failure Father   . Alcohol abuse Father   . Heart attack Father   . COPD Mother   . Colon cancer Neg Hx   . Colon polyps Neg Hx   . Diabetes Neg Hx   . Kidney disease Neg Hx     . Gallbladder disease Neg Hx   . Esophageal cancer Neg Hx    Scheduled Meds: . amoxicillin-clavulanate  1 tablet Oral BID WC  . folic acid  1 mg Oral Daily  . lactulose  30 g Oral TID  . metoprolol tartrate  12.5 mg Oral BID  . nutrition supplement (JUVEN)  1 packet Oral BID BM  . pantoprazole  40 mg Oral QAC breakfast  . prednisoLONE  40 mg Oral Daily  . rifaximin  550 mg Oral BID  . thiamine  100 mg Oral  Daily   Continuous Infusions:  PRN Meds:.polyvinyl alcohol Medications Prior to Admission:  Prior to Admission medications   Medication Sig Start Date End Date Taking? Authorizing Provider  aspirin EC 325 MG tablet Take 1 tablet (325 mg total) by mouth 2 (two) times daily. Patient taking differently: Take 325 mg by mouth daily.  09/22/16  Yes Leighton Parody, PA-C  folic acid (FOLVITE) 1 MG tablet Take 1 mg by mouth daily.   Yes [provider]  furosemide (LASIX) 40 MG tablet Take 0.5 tablets (20 mg total) by mouth daily. Resume when Sodium is >132 05/17/17  Yes Sheikh, Omair Latif, DO  hydroxypropyl methylcellulose / hypromellose (ISOPTO TEARS / GONIOVISC) 2.5 % ophthalmic solution Place 1 drop into both eyes 4 (four) times daily as needed for dry eyes. 06/20/15  Yes Rai, Ripudeep K, MD  lactulose (CHRONULAC) 10 GM/15ML solution Take 45 mLs (30 g total) by mouth 3 (three) times daily. 05/14/17  Yes Sheikh, [place], DO  liver oil-zinc oxide (DESITIN) 40 % ointment Apply topically 2 (two) times daily. 05/14/17  Yes Sheikh, Omair Latif, DO  metoprolol tartrate (LOPRESSOR) 25 MG tablet Take 0.5 tablets (12.5 mg total) by mouth 3 (three) times daily. 05/14/17  Yes Sheikh, Omair Latif, DO  mirtazapine (REMERON) 15 MG tablet Take 1 tablet (15 mg total) by mouth at bedtime. 05/14/17  Yes Sheikh, Omair Latif, DO  nystatin ointment (MYCOSTATIN) Apply topically 2 (two) times daily. 05/14/17  Yes Sheikh, Omair Latif, DO  Omega-3 Fatty Acids (FISH OIL) 500 MG CAPS Take 500 mg by mouth  daily.   Yes [provider]  pantoprazole (PROTONIX) 40 MG tablet Take 1 tablet (40 mg total) by mouth daily. Patient taking differently: Take 40 mg by mouth daily before breakfast.  04/26/14  Yes Barton Dubois, MD  prednisoLONE 5 MG TABS tablet Take 8 tablets (40 mg total) by mouth daily. 05/15/17  Yes Sheikh, Omair Latif, DO  rifaximin (XIFAXAN) 550 MG TABS tablet Take 1 tablet (550 mg total) by mouth 2 (two) times daily. 05/14/17  Yes Sheikh, Omair Latif, DO  spironolactone (ALDACTONE) 50 MG tablet Take 0.5 tablets (25 mg total) by mouth daily. Resume when Sodium is >132. 05/17/17  Yes Sheikh, Omair Latif, DO  thiamine 100 MG tablet Take 1 tablet (100 mg total) by mouth daily. 05/15/17  Yes Sheikh, Omair Latif, DO  tiZANidine (ZANAFLEX) 2 MG tablet Take 1 tablet (2 mg total) by mouth every 6 (six) hours as needed for muscle spasms. 09/22/16  Yes Leighton Parody, PA-C  triamcinolone ointment (KENALOG) 0.1 % Apply 1 application topically 2 (two) times daily. 02/13/17  Yes [provider]  Multiple Vitamin (MULTIVITAMIN WITH MINERALS) TABS tablet Take 1 tablet by mouth daily. Patient not taking: Reported on 05/03/2017 03/04/14   Eugenie Filler, MD   Allergies  Allergen Reactions  . Ace Inhibitors Other (See Comments)    Cough with lisinopril   Review of Systems Reports feeling full  Physical Exam Weak, chronically ill-appearing Icteric in appearance Scleral icterus Skin with multiple bruises erythema left lower extremity Shallow regular breath sounds S1-S2 Abdomen distended  Vital Signs: BP 92/66 (BP Location: Right Arm)   Pulse 74   Temp 97.8 F (36.6 C) (Oral)   Resp 20   Ht _0  (1.778 m)   Wt 104.1 kg (229 lb 8 oz)   SpO2 98%   BMI 32.93 kg/m  Pain Scale: 0-10   Pain Score: 0-No pain  SpO2: SpO2: 98 % O2 Device:SpO2: 98 % O2 Flow Rate: .   IO: Intake/output summary:   Intake/Output Summary (Last 24 hours) at 05/27/2017 1243 Last data filed at  05/27/2017 1213 Gross per 24 hour  Intake 600 ml  Output 6 ml  Net 594 ml    LBM: Last BM Date: 05/27/17 Baseline Weight: Weight: 104.1 kg (229 lb 8 oz) Most recent weight: Weight: 104.1 kg (229 lb 8 oz)     Palliative Assessment/Data:   Flowsheet Rows     Most Recent Value  Intake Tab  Referral Department  Hospitalist  Unit at Time of Referral  Med/Surg Unit  Palliative Care Primary Diagnosis  Other (Comment) [Acute hepatic encephalopathy]  Date Notified  05/23/17  Palliative Care Type  Return patient Palliative Care  Reason for referral  Clarify Goals of Care  Date of Admission  05/23/17  Date first seen by Palliative Care  05/24/17  # of days Palliative referral response time  1 Day(s)  # of days IP prior to Palliative referral  0  Clinical Assessment  Psychosocial & Spiritual Assessment  Palliative Care Outcomes    PPS 20%  Time Total:  60 min  Greater than 50%  of this time was spent counseling and coordinating care related to the above assessment and plan.  Signed by: Micheline Rough, MD [place] Team [phone]  Please contact Palliative Medicine Team phone at [phone] for questions and concerns.  For individual provider: See Shea Evans

## 2017-05-27 NOTE — Progress Notes (Signed)
Progress Note   Subjective   Chief Complaint: Liver failure due to acute alcoholic hepatitis in the setting of cirrhosis  Pt found laying in bed comfortably tells me he slept better last night. Denies any new complaints. Still remains confused today.   Objective   Vital signs in last 24 hours: Temp:  [97.9 F (36.6 C)-98.3 F (36.8 C)] 98 F (36.7 C) (03/27 0423) Pulse Rate:  [63-73] 63 (03/27 0423) Resp:  [16-20] 16 (03/27 0423) BP: (92-113)/(49-76) 113/66 (03/27 0423) SpO2:  [95 %-98 %] 95 % (03/27 0423) Last BM Date: 05/27/17 General: Ill-appearing jaundiced male in NAD Heart:  Regular rate and rhythm; no murmurs Lungs: Respirations even and unlabored, lungs CTA bilaterally Abdomen:  More tense today, mild generalized ttp, moderate distension. Normal bowel sounds. Extremities:  Without edema. Neurologic:  Alert and oriented,  grossly normal neurologically. Psych:  Cooperative.  Intake/Output from previous day: 03/26 0701 - 03/27 0700 In: 480 [P.O.:480] Out: 7 [Urine:2; Stool:5] Intake/Output this shift: Total I/O In: 360 [P.O.:360] Out: 2 [Urine:1; Stool:1]  Lab Results: Recent Labs    05/25/17 0528 05/26/17 0506  WBC 18.7* 18.7*  HGB 11.5* 12.0*  HCT 31.7* 33.0*  PLT 116* 112*   BMET Recent Labs    05/25/17 0528 05/26/17 0506  NA 126* 126*  K 4.4 4.4  CL 97* 98*  CO2 19* 19*  GLUCOSE 107* 129*  BUN 63* 66*  CREATININE 1.92* 1.99*  CALCIUM 7.8* 7.9*   LFT Recent Labs    05/26/17 0506  PROT 5.2*  ALBUMIN 1.7*  AST 181*  ALT 129*  ALKPHOS 213*  BILITOT 20.3*   PT/INR Recent Labs    05/25/17 0528  LABPROT 25.4*  INR 2.34    Studies/Results: US Paracentesis  Result Date: 05/25/2017 INDICATION: Alcoholic hepatitis, cirrhosis, recurrent ascites. Request made for diagnostic and therapeutic paracentesis up to 5 liters. EXAM: ULTRASOUND GUIDED DIAGNOSTIC AND THERAPEUTIC PARACENTESIS MEDICATIONS: None COMPLICATIONS: None immediate.  PROCEDURE: Informed written consent was obtained from the patient after a discussion of the risks, benefits and alternatives to treatment. A timeout was performed prior to the initiation of the procedure. Initial ultrasound scanning demonstrates a moderate to large amount of ascites within the left mid to lower abdominal quadrant. The left mid to lower abdomen was prepped and draped in the usual sterile fashion. 1% lidocaine was used for local anesthesia. Following this, a 19 gauge, 7-cm, Yueh catheter was introduced. An ultrasound image was saved for documentation purposes. The paracentesis was performed. The catheter was removed and a dressing was applied. The patient tolerated the procedure well without immediate post procedural complication. FINDINGS: A total of approximately 3.6 liters of golden yellow fluid was removed. Samples were sent to the laboratory as requested by the clinical team. Due to hypotension only the above amount of fluid was removed today. IMPRESSION: Successful ultrasound-guided diagnostic and therapeutic paracentesis yielding 3.6 liters of peritoneal fluid. Read by: Jeananne Rama, PA-C Electronically Signed   By: Jolaine Click M.D.   On: 05/25/2017 12:17    Assessment / Plan:    Assessment: 1. Liver failure due to alcoholic hepatitis in the setting of cirrhosis: DF score currently 84.4, MELD 36, creatinine remains elevated 1.99 today, HE improved on Lactulose and Xifaxan 2. AKI vs hepatorenal syndrome  Plan: 1. Continue current medications 2. Please await further recommendations from Dr. Christella Hartigan later today   Thank you for your kind consultation, we will continue to follow along.   LOS: 4 days  Gary [name] [facility]  05/27/2017, 9:26 AM  Pager # [phone]

## 2017-05-27 NOTE — Progress Notes (Signed)
Physical Therapy Treatment Patient Details Name: Gary [name] MRN: [id number] DOB: 16-Feb-1951 Today's Date: 05/27/2017    History of Present Illness Gary [name] is a 67 y.o. male with medical history significant of alcohol induced cirrhosis, arthritis, hypertension, reflux, alcohol abuse, diastolic CHF. Pt present with AMS. encephalopthy    PT Comments    Pt progressing slowly, cooperative today, talking more; continue to recommend SNF (vs residential hospice)  Follow Up Recommendations  SNF     Equipment Recommendations  None recommended by PT    Recommendations for Other Services       Precautions / Restrictions Precautions Precautions: Fall Restrictions Weight Bearing Restrictions: No    Mobility  Bed Mobility Overal bed mobility: Needs Assistance Bed Mobility: Rolling;Supine to Sit Rolling: Min assist Sidelying to sit: Min assist       General bed mobility comments: increased time, light assist with trunk to upright  Transfers Overall transfer level: Needs assistance Equipment used: Rolling walker (2 wheeled) Transfers: Sit to/from [employer] Sit to Stand: Min assist Stand pivot transfers: Min assist       General transfer comment: Assist to rise, stabilize. Increased time, fatigues easily  Ambulation/Gait Ambulation/Gait assistance: Min guard;Min assist Ambulation Distance (Feet): 4 Feet Assistive device: Rolling walker (2 wheeled) Gait Pattern/deviations: Step-to pattern;Wide base of support     General Gait Details: cues for RW safety and position fatigues quickly   Stairs            Wheelchair Mobility    Modified Rankin (Stroke Patients Only)       Balance     Sitting balance-Leahy Scale: Fair     Standing balance support: Bilateral upper extremity supported Standing balance-Leahy Scale: Poor Standing balance comment: relies on BUE support                            Cognition Arousal/Alertness:  Awake/alert Behavior During Therapy: WFL for tasks assessed/performed Overall Cognitive Status: Within Functional Limits for tasks assessed                                 General Comments: increased time to respond but A & O      Exercises General Exercises - Lower Extremity Ankle Circles/Pumps: AROM;Both;5 reps    General Comments General comments (skin integrity, edema, etc.): incontinent of stool in bed, LE edema improved today      Pertinent Vitals/Pain Pain Assessment: Faces Faces Pain Scale: Hurts little more Pain Location: perineal area Pain Descriptors / Indicators: Sore Pain Intervention(s): Monitored during session    Home Living                      Prior Function            PT Goals (current goals can now be found in the care plan section) Acute Rehab PT Goals Patient Stated Goal: none stated PT Goal Formulation: With patient Time For Goal Achievement: 06/08/18 Potential to Achieve Goals: Fair Progress towards PT goals: Progressing toward goals    Frequency    Min 2X/week      PT Plan Current plan remains appropriate    Co-evaluation              AM-PAC PT "6 Clicks" Daily Activity  Outcome Measure  Difficulty turning over in bed (including adjusting bedclothes, sheets and blankets)?: Unable  Difficulty moving from lying on back to sitting on the side of the bed? : Unable Difficulty sitting down on and standing up from a chair with arms (e.g., wheelchair, bedside commode, etc,.)?: Unable Help needed moving to and from a bed to chair (including a wheelchair)?: A Little Help needed walking in hospital room?: A Little Help needed climbing 3-5 steps with a railing? : A Little 6 Click Score: 12    End of Session   Activity Tolerance: Patient limited by fatigue;Patient tolerated treatment well Patient left: with call bell/phone within reach;in chair;with chair alarm set   PT Visit Diagnosis: Unsteadiness on feet  (R26.81);Difficulty in walking, not elsewhere classified (R26.2)     Time: 4098-1191 PT Time Calculation (min) (ACUTE ONLY): 15 min  Charges:  $Therapeutic Activity: 8-22 mins                    G CodesDrucilla Chalet, PT Pager: [phone] 05/27/2017    [facility] 05/27/2017, 2:46 PM

## 2017-05-27 NOTE — Progress Notes (Deleted)
Spoke with Dr. Mosetta Putt from Oncology who will see the patient in consult during his hospitalization.  Hyacinth Meeker, PA-C

## 2017-05-27 NOTE — Clinical Social Work Note (Signed)
Clinical Social Work Assessment  Patient Details  Name: Gary [name] MRN: [id number] Date of Birth: 1951-03-01  Date of referral:  05/27/17               Reason for consult:  Discharge Planning, Facility Placement                Permission sought to share information with:    Permission granted to share information::     Name::        Agency::     Relationship::     Contact Information:     Housing/Transportation Living arrangements for the past 2 months:    Source of Information:  Patient, Spouse Patient Interpreter Needed:  None Criminal Activity/Legal Involvement Pertinent to Current Situation/Hospitalization:  No - Comment as needed Significant Relationships:  Spouse Lives with:  Spouse Do you feel safe going back to the place where you live?  (PT recommneding SNF) Need for family participation in patient care:  Yes (Comment)  Care giving concerns:  Patient from home with wife. Patient's wife reported that when patient was recently discharged no one helped with patient. PT recommending SNF.   Social Worker assessment / plan:  CSW spoke with patient/patient's wife at bedside regarding discharge planning. Patient confused and reported that he has been at the hospital for six weeks. CSW explained to patient that he was recently discharged home and then came back to the hospital, patient did not verbalize understanding. CSW explained to patient/patient's wife that during the last admission patient attempted to get insurance authorization for SNF and it was denied per SNF. CSW inquired about patient's ability to private pay for SNF for custodial care. Patient's wife reported that she wanted to speak with her husband and that she would call CSW if she needed to. CSW explained role to assist with discharge planning, patient's wife reiterated that she would call CSW if she needed to after she spoke with patient. CSW left room.  Per chart review, palliative following and the recommendation  is " a trial of rehab at SNF versus home hospice". Patient will need to private pay for SNF if that option is desired due to lack of insurance authorization.   CSW will continue to follow and assist with discharge planning.  Employment status:  Retired Database administrator PT Recommendations:  Skilled Nursing Facility Information / Referral to community resources:  Skilled Nursing Facility  Patient/Family's Response to care:  Patient uncertain about discharge plans at this time.   Patient/Family's Understanding of and Emotional Response to Diagnosis, Current Treatment, and Prognosis:  Patient presented calm and became irritable when CSW explained that patient was discharged home after last admission and readmitted. Patient did not verbalize understanding of current treatment or prognosis. Patient has intermittent confusion.   Emotional Assessment Appearance:  Appears stated age Attitude/Demeanor/Rapport:  Other(Confused) Affect (typically observed):  Irritable Orientation:  Oriented to Self, Fluctuating Orientation (Suspected and/or reported Sundowners), Oriented to Situation(Patient confused intermittently; ) Alcohol / Substance use:  Alcohol Use(Hx of alcohol use) Psych involvement (Current and /or in the community):  No (Comment)  Discharge Needs  Concerns to be addressed:  Care Coordination Readmission within the last 30 days:  Yes Current discharge risk:  Chronically ill, Terminally ill, Physical Impairment Barriers to Discharge:  Continued Medical Work up   [employer], LCSW 05/27/2017, 4:35 PM

## 2017-05-27 NOTE — Consult Note (Addendum)
WOC Nurse wound consult note Reason for Consult: Consult requested for bilat buttocks.  Pt is frequently incontinent of diarrhea stool related to lactulose and previously applied dressings were trapping stool against skin. Wound type: 2 areas of stage 2 pressure injuries; appearance is consistent with wounds which are a result of friction and shear. Left buttock .3X.3X.1cm and right buttock 2.5X1X.1cm Both are pink and moist, small amt yellow drainage, tender to touch and surrounded by red, patchy areas of maceration and appearance is consistent withmoisture associated skin damage. Pressure Injury POA: Yes Dressing procedure/placement/frequency: Leave dressings off, since they wound constantly become soiled.  Barrier cream to repel moisture and promote healing. Discussed plan of care with patient. Please re-consult if further assistance is needed.  Thank-you,  Cammie Mcgee MSN, RN, CWOCN, [place], CNS [phone]

## 2017-05-27 NOTE — Progress Notes (Signed)
PROGRESS NOTE    Gary [name]  ZOX:[id number] DOB: August 12, 1950 DOA: 05/23/2017 PCP: [name], NP    Brief Narrative:  67 y.o. male with medical history significant of alcohol induced cirrhosis, arthritis, hypertension, reflux, alcohol abuse, diastolic CHF.  Patient presented with acute mental status change concerns for hepatic encephalopathy.  During the course, patient noted to have a DF score of 84.4 which correlates to a 30-day mortality risk of up to 50%.. GI recommendations were noted for hospice referral.  [place] of care service was subsequently consulted.   Assessment & Plan:   Active Problems:   Thrombocytopenia (HCC)   Hyponatremia   History of alcohol abuse   Ascites   Hepatic cirrhosis (HCC)   AKI (acute kidney injury) (HCC)   Acute hepatic encephalopathy   Leukocytosis   Hepatic encephalopathy (HCC)   Left leg cellulitis   UTI (urinary tract infection)   Prolonged QT interval  Hepatic encephalopathy:  Urine with multiple species, was on rocephin, change to augmentin  to finish total of 5 days abx treatment Patient continued on lactulose, rifaximin  Alcohol cirrhosis, ascites, generalized edema, thrombocytopenia, hyponatremia, elevated inr On prednisone Paracentesis with 3liter removed, no sbp Poor prognosis with estimated mortality of 50% in the next 30 days per GI Gi and palliative care consulted Need to continue goals of care discussion   Patient is not consistent in terms of HPOA, Appreciate input by palliative of care. Trying to determine primary decision maker.  AKI/ckdII Cr 0.8 on 3/14, cr 2.2 on presentation Urine culture with multiple species Creatinine stable at 2.43, repeat basic metabolic panel in the morning  Diastolic chf, sigificant fluids overloaded, renal function worsening, diuretics on hold secondary to rising creatinine per above  DVT prophylaxis: SCD's Code Status: DNR Family Communication: Pt in room, family not  present Disposition Plan: Uncertain at this time  Consultants:   GI  Palliative Care  Procedures:     Antimicrobials: Anti-infectives (From admission, onward)   Start     Dose/Rate Route Frequency Ordered Stop   05/26/17 2000  amoxicillin-clavulanate (AUGMENTIN) 500-125 MG per tablet 500 mg     1 tablet Oral 2 times daily with meals 05/26/17 1954 05/28/17 1659   05/24/17 0000  rifaximin (XIFAXAN) tablet 550 mg     550 mg Oral 2 times daily 05/23/17 2347     05/24/17 0000  cefTRIAXone (ROCEPHIN) 2 g in sodium chloride 0.9 % 100 mL IVPB  Status:  Discontinued     2 g 200 mL/hr over 30 Minutes Intravenous Daily at bedtime 05/23/17 2347 05/26/17 1953       Subjective: Patient without complaints at this time  Objective: Vitals:   05/27/17 0423 05/27/17 0943 05/27/17 0947 05/27/17 1352  BP: 113/66 92/66  (!) 102/56  Pulse: 63 (!) 112 74 65  Resp: 16 20  20   Temp: 98 F (36.7 C) 97.8 F (36.6 C)  97.7 F (36.5 C)  TempSrc: Oral Oral  Oral  SpO2: 95% 98%  99%  Weight:      Height:        Intake/Output Summary (Last 24 hours) at 05/27/2017 1547 Last data filed at 05/27/2017 1535 Gross per 24 hour  Intake 840 ml  Output 8 ml  Net 832 ml   Filed Weights   05/23/17 2353  Weight: 104.1 kg (229 lb 8 oz)    Examination:  General exam: Appears calm and comfortable  Respiratory system: Clear to auscultation. Respiratory effort normal. Cardiovascular system:  S1 & S2 heard, regular Gastrointestinal system: Positive bowel sounds, nontender, distended Central nervous system: Alert and oriented. No focal neurological deficits. Extremities: Symmetric 5 x 5 power. Skin: No rashes, lesions or ulcers Psychiatry: Judgement and insight appear normal, patient is aware of his underlying cirrhosis and knows that he is in the hospital by name  Data Reviewed: I have personally reviewed following labs and imaging studies  CBC: Recent Labs  Lab 05/23/17 1825 05/23/17 1838  05/24/17 0552 05/25/17 0528 05/26/17 0506  WBC  --  16.2* 14.1* 18.7* 18.7*  NEUTROABS  --   --   --  16.7*  --   HGB 15.0 13.2 12.3* 11.5* 12.0*  HCT 44.0 36.3* 34.3* 31.7* 33.0*  MCV  --  104.9* 105.9* 103.6* 103.4*  PLT  --  110* 98* 116* 112*   Basic Metabolic Panel: Recent Labs  Lab 05/23/17 1838 05/24/17 0552 05/25/17 0528 05/26/17 0506 05/27/17 0920  NA 126* 126* 126* 126* 123*  K 4.3 4.1 4.4 4.4 5.2*  CL 93* 95* 97* 98* 92*  CO2 21* 19* 19* 19* 19*  GLUCOSE 103* 93 107* 129* 152*  BUN 64* 59* 63* 66* 80*  CREATININE 1.93* 1.59* 1.92* 1.99* 2.43*  CALCIUM 8.2* 7.8* 7.8* 7.9* 8.3*  MG  --  2.3  --   --   --   PHOS  --  4.4  --   --   --    GFR: Estimated Creatinine Clearance: 35.6 mL/min (A) (by C-G formula based on SCr of 2.43 mg/dL (H)). Liver Function Tests: Recent Labs  Lab 05/23/17 1838 05/24/17 0552 05/25/17 0528 05/26/17 0506 05/27/17 0920  AST 260* 225* 199* 181* 192*  ALT 158* 129* 130* 129* 147*  ALKPHOS 218* 187* 198* 213* 239*  BILITOT 24.3* 20.4* 20.0* 20.3* 21.5*  PROT 6.2* 5.1* 5.1* 5.2* 5.6*  ALBUMIN 1.9* 1.6* 1.6* 1.7* 1.8*   No results for input(s): LIPASE, AMYLASE in the last 168 hours. Recent Labs  Lab 05/23/17 1838 05/25/17 0528  AMMONIA 39* 34   Coagulation Profile: Recent Labs  Lab 05/23/17 2001 05/24/17 0552 05/25/17 0528  INR 2.03 2.24 2.34   Cardiac Enzymes: No results for input(s): CKTOTAL, CKMB, CKMBINDEX, TROPONINI in the last 168 hours. BNP (last 3 results) No results for input(s): PROBNP in the last 8760 hours. HbA1C: No results for input(s): HGBA1C in the last 72 hours. CBG: No results for input(s): GLUCAP in the last 168 hours. Lipid Profile: No results for input(s): CHOL, HDL, LDLCALC, TRIG, CHOLHDL, LDLDIRECT in the last 72 hours. Thyroid Function Tests: No results for input(s): TSH, T4TOTAL, FREET4, T3FREE, THYROIDAB in the last 72 hours. Anemia Panel: Recent Labs    05/25/17 0528  VITAMINB12  >7,500*  FOLATE 19.9   Sepsis Labs: Recent Labs  Lab 05/23/17 1825  LATICACIDVEN 3.68*    Recent Results (from the past 240 hour(s))  MRSA PCR Screening     Status: None   Collection Time: 05/24/17  1:42 AM  Result Value Ref Range Status   MRSA by PCR NEGATIVE NEGATIVE Final    Comment:        The GeneXpert MRSA Assay (FDA approved for NASAL specimens only), is one component of a comprehensive MRSA colonization surveillance program. It is not intended to diagnose MRSA infection nor to guide or monitor treatment for MRSA infections. Performed at [facility], [address], [place], [place] [address]   Urine Culture     Status: Abnormal  Collection Time: 05/24/17  4:49 AM  Result Value Ref Range Status   Specimen Description   Final    URINE, Gary Performed at [facility], [address], [place], [place] [address]    Special Requests   Final    NONE Performed at [facility], [address], [place], [place] [address]    Culture MULTIPLE SPECIES PRESENT, SUGGEST RECOLLECTION (A)  Final   Report Status 05/25/2017 FINAL  Final  Body fluid culture     Status: None (Preliminary result)   Collection Time: 05/25/17 11:47 AM  Result Value Ref Range Status   Specimen Description   Final    PERITONEAL Performed at [facility], [address], [place], [place] [address]    Special Requests   Final    NONE Performed at [facility], [address], [place], [place] [address]    Gram Stain   Final    FEW WBC PRESENT, PREDOMINANTLY MONONUCLEAR NO ORGANISMS SEEN    Culture   Final    NO GROWTH 2 DAYS Performed at [facility], [address] [place], [place] [address]    Report Status PENDING  Incomplete     Radiology Studies: No results found.  Scheduled Meds: . amoxicillin-clavulanate  1 tablet Oral BID WC  . folic acid  1 mg Oral Daily  . lactulose  30 g  Oral TID  . metoprolol tartrate  12.5 mg Oral BID  . nutrition supplement (JUVEN)  1 packet Oral BID BM  . pantoprazole  40 mg Oral QAC breakfast  . prednisoLONE  40 mg Oral Daily  . rifaximin  550 mg Oral BID  . thiamine  100 mg Oral Daily   Continuous Infusions:   LOS: 4 days   Rickey Barbara, MD [facility] Pager [phone]  If 7PM-7AM, please contact night-coverage [email or web address] Password [facility] 05/27/2017, 3:47 PM

## 2017-05-28 DIAGNOSIS — E860 Dehydration: Secondary | ICD-10-CM

## 2017-05-28 LAB — COMPREHENSIVE METABOLIC PANEL
ALK PHOS: 221 U/L — AB (ref 38–126)
ALT: 142 U/L — AB (ref 17–63)
AST: 172 U/L — AB (ref 15–41)
Albumin: 1.8 g/dL — ABNORMAL LOW (ref 3.5–5.0)
Anion gap: 10 (ref 5–15)
BUN: 99 mg/dL — ABNORMAL HIGH (ref 6–20)
CALCIUM: 8.5 mg/dL — AB (ref 8.9–10.3)
CHLORIDE: 96 mmol/L — AB (ref 101–111)
CO2: 18 mmol/L — ABNORMAL LOW (ref 22–32)
CREATININE: 2.55 mg/dL — AB (ref 0.61–1.24)
GFR, EST AFRICAN AMERICAN: 28 mL/min — AB (ref >60)
GFR, EST NON AFRICAN AMERICAN: 24 mL/min — AB (ref >60)
Glucose, Bld: 140 mg/dL — ABNORMAL HIGH (ref 65–99)
Potassium: 5.8 mmol/L — ABNORMAL HIGH (ref 3.5–5.1)
Sodium: 124 mmol/L — ABNORMAL LOW (ref 135–145)
Total Bilirubin: 21.4 mg/dL (ref 0.3–1.2)
Total Protein: 5.4 g/dL — ABNORMAL LOW (ref 6.5–8.1)

## 2017-05-28 MED ORDER — LORAZEPAM 1 MG PO TABS: 1.0000 mg | ORAL_TABLET | ORAL | 0 refills | Status: AC | PRN

## 2017-05-28 MED ORDER — MORPHINE SULFATE (PF) 4 MG/ML IV SOLN
1.0000 mg | INTRAVENOUS | Status: DC | PRN
  Administered 2017-05-28: 1 mg via INTRAVENOUS
  Filled 2017-05-28: qty 1

## 2017-05-28 MED ORDER — PROCHLORPERAZINE EDISYLATE 5 MG/ML IJ SOLN
5.0000 mg | Freq: Four times a day (QID) | INTRAMUSCULAR | Status: DC | PRN
Start: 2017-05-28 — End: 2017-05-29
  Administered 2017-05-28: 5 mg via INTRAVENOUS
  Filled 2017-05-28: qty 2

## 2017-05-28 MED ORDER — MORPHINE SULFATE (CONCENTRATE) 10 MG /0.5 ML PO SOLN: 10.0000 mg | ORAL | 0 refills | Status: AC | PRN

## 2017-05-28 NOTE — Progress Notes (Signed)
[facility] Liaison: RN visit  Notified by Geni Bers, [facility] of patient/family request for [facility] services at home after discharge. Chart and patient information under review by [facility] physician. Hospice eligibility has been approved by Dr. Jamie Brookes.   Writer spoke with  patient and his son, Lorin Picket at bedside to initiate education related to hospice philosophy, services and team approach to care. verbalized understanding of information given. Per discussion, plan is for discharge to home by private vehicle today.  Please send signed and completed DNR form home with patient/family. Patient will need prescriptions for discharge comfort medications.  DME needs have been discussed, patient currently has the following equipment in the home: walker. Patient/family requests the following DME for delivery to the home: Hospital bed, OBT, 3N1, shower chair and oxygen for 2L [place] [employer]. [facility] equipment manager has been notified and will contact [facility] to arrange delivery to the home. Home address has been verified and is correct in the chart. Searle Castorina is the family member to contact to arrange time of delivery.  [facility] aware of the above. Please notify [facility] when patient is ready to leave the unit at discharge. (Call [phone] or [phone] after 5pm.) [facility] information and contact numbers given to at time of visit. Above information shared with CMRN.  Please call with any hospice related questions.  Thank you for this referral.  Elsie Saas, RN, [facility] Liaison [phone] ? [facility] liaisons are now on AMION.

## 2017-05-28 NOTE — Progress Notes (Signed)
Palliative care progress note  Reason for consult: Goals of care in light of ESLD/cirrhosis  Chart reviewed and discussed with bedside RN, Dr. Wyline Copas, and case manager.  I met today with Gary [name] in conjunction with case manager.  We talked about option for transitioning home with hospice support.  While he ultimately wants to be at home, he was open yesterday to rehab for a short period of time but this would be private pay and he stated yesterday that this is not something he can afford.   - Gary [name] remains invested in trying to live as long as possible.  He understands that he has chronic conditions that are not going to improve and that he is approaching end of life.   We have been discussing trial of rehab at skilled facility versus transitioning home with the support of hospice as options, but he is not able to private pay for rehab services.    - He has stated that his wife has difficulty understanding/accepting the fact he is approaching end of life.  Case management and I met with him this AM and reviewed recommendation to elect his hospice benefits on discharge as this will give him the greatest amount of in home support on discharge.  He agreed with this recommendation.    - While Gary [name] is intermittently confused, he has been consistent in the past several days with stating his understanding that he has a terminal disease, expressing desire to be at home, and agreeing with plan for home with hospice support.  He has also told me on several occasions that his wife does not understand/accept this and there is difficulty in communication with cultural and language barriers.  He has stated that he wants to name his sister as his surrogate, but then declined to complete paperwork.  While this remains a difficult social situation, he seems clear in his desires and is able to consistently repeat them back to me.  Therefore, recommend pursuing home with hospice support.    Total time: 20  minutes  Greater than 50%  of this time was spent counseling and coordinating care related to the above assessment and plan. Micheline Rough, MD [place] Team [phone]

## 2017-05-28 NOTE — Progress Notes (Signed)
Agree with previous RN's assessments. Night time medications given to pt prior to d/c. Discharge instructions went over with family. Son and wife at bedside to transport pt. Pt wheeled out to car. All belongings and prescriptions sent with pt. Questions addressed and answered at this time.

## 2017-05-28 NOTE — Progress Notes (Signed)
Spoke with pt and offered choices for Home with Hospice. Pt selected [facility]. Referral called to [facility] and Palliative Care [phone].

## 2017-05-28 NOTE — Progress Notes (Signed)
CSW informed by patient's RNCM that patient is discharging home with hospice. CSW signing off, no other needs identified at this time.  Celso Sickle, [place] Clinical Social Worker [facility] Cell#: [phone]

## 2017-05-28 NOTE — Progress Notes (Signed)
Spoke w/ pt son, Lorin Picket, regarding pt d/c home today. Pt states that he wants to d/c home in car with family.  Pt has only ambulated from bed to chair/BSC this admission. Spoke w/ PT Delice Bison who worked with pt yesterday.  She agrees that pt is unsafe to ambulate from car to house and into house. Lorin Picket is requesting a w/c for home to assist getting pt into/out of house.  [place], [facility] liason, made aware and has ordered. This Clinical research associate assisted wife, Teressa Senter, to make appointment for equipment delivery.  This Clinical research associate spoke to [facility] liasion on speakerphone w/ pt and wife present.  Delivery will take place between 5-9pm.  Quin verbalized understanding that [facility] will call her prior to delivery and then she will call Lorin Picket so he can help with accepting delivery and then come pick up pt for d/c. This Clinical research associate also spoke to [place] to confirm this plan.  He verbalized understanding and agreement and was appreciative of the assistance.

## 2017-05-28 NOTE — Progress Notes (Signed)
Occupational Therapy Treatment Patient Details Name: Gary [name] MRN: [id number] DOB: 27-Aug-1950 Today's Date: 05/28/2017    History of present illness Gary [name] is a 67 y.o. male with medical history significant of alcohol induced cirrhosis, arthritis, hypertension, reflux, alcohol abuse, diastolic CHF. Pt present with AMS. encephalopthy   OT comments  OT agreed to get OOB  Follow Up Recommendations  SNF/Hospice   Equipment Recommendations  None recommended by OT    Recommendations for Other Services      Precautions / Restrictions Precautions Precautions: Fall Restrictions Weight Bearing Restrictions: No(Simultaneous filing. User may not have seen previous data.)       Mobility Bed Mobility Overal bed mobility: Needs Assistance Bed Mobility: Rolling;Supine to Sit     Supine to sit: Mod assist     General bed mobility comments: increased time, light assist with trunk to upright  Transfers Overall transfer level: Needs assistance Equipment used: Rolling walker (2 wheeled) Transfers: Sit to/from [employer] Sit to Stand: Min assist;Mod assist Stand pivot transfers: Min assist;Mod assist       General transfer comment: Assist to rise, stabilize. Increased time, fatigues easily        ADL either performed or assessed with clinical judgement   ADL Overall ADL's : Needs assistance/impaired Eating/Feeding: Sitting;Set up   Grooming: Set up;Sitting                   Toilet Transfer: Stand-pivot;RW;Cueing for sequencing;Cueing for safety;Minimal assistance Toilet Transfer Details (indicate cue type and reason): bed to chair           General ADL Comments: pt agreed to chair. Wife present. RN aware     Vision Patient Visual Report: No change from baseline            Cognition Arousal/Alertness: Awake/alert Behavior During Therapy: WFL for tasks assessed/performed Overall Cognitive Status: Within Functional Limits for  tasks assessed                                                General Comments      Pertinent Vitals/ Pain       Pain Assessment: 0-10 Pain Score: 4  Pain Location: bottom Pain Descriptors / Indicators: Sore Pain Intervention(s): Limited activity within patient's tolerance;Repositioned;Monitored during session;Relaxation         Frequency  Min 2X/week        Progress Toward Goals  OT Goals(current goals can now be found in the care plan section)  Progress towards OT goals: Progressing toward goals     Plan Discharge plan needs to be updated    Co-evaluation                 AM-PAC PT "6 Clicks" Daily Activity     Outcome Measure   Help from another person eating meals?: A Little Help from another person taking care of personal grooming?: A Little Help from another person toileting, which includes using toliet, bedpan, or urinal?: A Lot Help from another person bathing (including washing, rinsing, drying)?: A Lot Help from another person to put on and taking off regular upper body clothing?: A Lot Help from another person to put on and taking off regular lower body clothing?: A Lot 6 Click Score: 14    End of Session Equipment Utilized During Treatment: Rolling walker  OT  Visit Diagnosis: Unsteadiness on feet (R26.81);Other abnormalities of gait and mobility (R26.89);History of falling (Z91.81);Muscle weakness (generalized) (M62.81)   Activity Tolerance Patient tolerated treatment well   Patient Left with call bell/phone within reach;with bed alarm set;in chair   Nurse Communication Mobility status        Time: 1884-1660 OT Time Calculation (min): 10 min  Charges: OT General Charges $OT Visit: 1 Visit OT Treatments $Self Care/Home Management : 8-22 mins  [place], [place] [phone]   Einar Crow D 05/28/2017, 11:45 AM

## 2017-05-28 NOTE — Plan of Care (Signed)
  Problem: Safety: Goal: Ability to remain free from injury will improve Outcome: Progressing   

## 2017-05-28 NOTE — Discharge Summary (Addendum)
Physician Discharge Summary  Gary [name]:[id number] DOB: 1951-01-11 DOA: 05/23/2017  PCP: [name], NP  Admit date: 05/23/2017 Discharge date: 05/28/2017  Admitted From: Home Disposition:  Home with hospice  Recommendations for Outpatient Follow-up:  1. Follow up with hospice services  Discharge Condition:Stable CODE STATUS:DNR Diet recommendation: Comfort   Brief/Interim Summary: 67 y.o.malewith medical history significant of alcohol induced cirrhosis,arthritis, hypertension, reflux, alcohol abuse, diastolic CHF.  Patient presented with acute mental status change concerns for hepatic encephalopathy.  During the course, patient noted to have a DF score of 84.4 which correlates to a 30-day mortality risk of up to 50%.. GI recommendations were noted for hospice referral.  [facility] service was subsequently consulted.  Hepatic encephalopathy:  Urine with multiple species,was onrocephin, change to augmentin to finish total of 5 days abx treatment Patient was onlactulose, rifaximin Patient to go home with hospice services Will give Roxanol prn for air hunger and pain as well as PRN ativan for anxiety. [place] Controlled Registry reviewed. Patient not listed. Also, controlled substances prescribed for palliative reasons  Alcohol cirrhosis, ascites, generalized edema, thrombocytopenia, hyponatremia, elevated inr On prednisone this admit Paracentesisperformed this admit with 3liter removed, no sbp Poor prognosis with estimated mortality of 50% in the next 30 days per GI Gi and palliative care consulted Palliative care input appreciated. Patient's wishes are known to be for home with hospice. Have arranged home hospice  AKI/ckdII Cr 0.8 on 3/14, cr 2.2 on presentation Urine culture with multiple species  Diastolic chf, sigificant fluids overloaded, renal function worsening, diuretics on hold secondary to rising creatinine per above  Stage II Pressure Injuries of Right  and Left Buttocks   Discharge Diagnoses:  Active Problems:   Thrombocytopenia (HCC)   Hyponatremia   History of alcohol abuse   Ascites   Hepatic cirrhosis (HCC)   AKI (acute kidney injury) (HCC)   Acute hepatic encephalopathy   Leukocytosis   Hepatic encephalopathy (HCC)   Left leg cellulitis   UTI (urinary tract infection)   Prolonged QT interval    Discharge Instructions   Allergies as of 05/28/2017      Reactions   Ace Inhibitors Other (See Comments)   Cough with lisinopril      Medication List    STOP taking these medications   aspirin EC 325 MG tablet   Fish Oil 500 MG Caps   folic acid 1 MG tablet Commonly known as:  FOLVITE   furosemide 40 MG tablet Commonly known as:  LASIX   liver oil-zinc oxide 40 % ointment Commonly known as:  DESITIN   mirtazapine 15 MG tablet Commonly known as:  REMERON   multivitamin with minerals Tabs tablet   nystatin ointment Commonly known as:  MYCOSTATIN   spironolactone 50 MG tablet Commonly known as:  ALDACTONE   thiamine 100 MG tablet   tiZANidine 2 MG tablet Commonly known as:  ZANAFLEX   triamcinolone ointment 0.1 % Commonly known as:  KENALOG     TAKE these medications   hydroxypropyl methylcellulose / hypromellose 2.5 % ophthalmic solution Commonly known as:  ISOPTO TEARS / GONIOVISC Place 1 drop into both eyes 4 (four) times daily as needed for dry eyes.   lactulose 10 GM/15ML solution Commonly known as:  CHRONULAC Take 45 mLs (30 g total) by mouth 3 (three) times daily.   LORazepam 1 MG tablet Commonly known as:  ATIVAN Take 1 tablet (1 mg total) by mouth every 4 (four) hours as needed for  anxiety (for palliative care).   metoprolol tartrate 25 MG tablet Commonly known as:  LOPRESSOR Take 0.5 tablets (12.5 mg total) by mouth 3 (three) times daily.   morphine CONCENTRATE 10 mg / 0.5 ml concentrated solution Take 0.5 mLs (10 mg total) by mouth every 4 (four) hours as needed for severe pain or  shortness of breath (for palliative care).   pantoprazole 40 MG tablet Commonly known as:  PROTONIX Take 1 tablet (40 mg total) by mouth daily. What changed:  when to take this   prednisoLONE 5 MG Tabs tablet Take 8 tablets (40 mg total) by mouth daily.   rifaximin 550 MG Tabs tablet Commonly known as:  XIFAXAN Take 1 tablet (550 mg total) by mouth 2 (two) times daily.      Follow-up Information    Follow up with hopsice services Follow up.          Allergies  Allergen Reactions  . Ace Inhibitors Other (See Comments)    Cough with lisinopril    Consultations:  GI  Palliative Care  Procedures/Studies: Dg Chest 2 View  Result Date: 05/23/2017 CLINICAL DATA:  Has not eaten for 2 days, jaundiced, history CHF, hypertension EXAM: CHEST - 2 VIEW COMPARISON:  05/03/2017 FINDINGS: Mild elevation of RIGHT diaphragm. Enlargement of cardiac silhouette. Mediastinal contours and pulmonary vascularity normal. No gross infiltrate, pleural effusion or pneumothorax. Bones unremarkable. IMPRESSION: Mild enlargement of cardiac silhouette. No acute abnormalities. Electronically Signed   By: Ulyses Southward M.D.   On: 05/23/2017 17:34   Dg Chest 2 View  Result Date: 05/03/2017 CLINICAL DATA:  Shortness of breath, nonproductive cough EXAM: CHEST  2 VIEW COMPARISON:  09/11/2016 FINDINGS: Lungs are clear.  No pleural effusion or pneumothorax. The heart is normal in size. Visualized osseous structures are within normal limits. IMPRESSION: Normal chest radiographs. Electronically Signed   By: Charline Bills M.D.   On: 05/03/2017 09:51   Dg Pelvis 1-2 Views  Result Date: 05/03/2017 CLINICAL DATA:  Patient fell out of his chair last night. Recent right hip replacement. Complaining of pain. Right knee and ankle swelling. EXAM: PELVIS - 1-2 VIEW COMPARISON:  09/22/2016 FINDINGS: No fracture.  No bone lesion. Right total hip arthroplasty is well-seated and aligned. Left hip joint, SI joints and symphysis  pubis are normally aligned. Soft tissues are unremarkable. IMPRESSION: 1. No fracture or dislocation. 2. No evidence of loosening of the right hip arthroplasty. Electronically Signed   By: Amie Portland M.D.   On: 05/03/2017 09:51   Ct Head Wo Contrast  Result Date: 05/23/2017 CLINICAL DATA:  Altered level of consciousness. EXAM: CT HEAD WITHOUT CONTRAST TECHNIQUE: Contiguous axial images were obtained from the base of the skull through the vertex without intravenous contrast. COMPARISON:  06/15/2015 FINDINGS: Brain: There is no evidence for acute hemorrhage, hydrocephalus, mass lesion, or abnormal extra-axial fluid collection. No definite CT evidence for acute infarction. Diffuse loss of parenchymal volume is consistent with atrophy. Patchy low attenuation in the deep hemispheric and periventricular white matter is nonspecific, but likely reflects chronic microvascular ischemic demyelination. Age indeterminate lacunar infarct noted right basal ganglia. Vascular: Atherosclerotic calcification of the carotid siphons noted at the skull base. No dense MCA sign. Skull: No evidence for fracture. No worrisome lytic or sclerotic lesion. Sinuses/Orbits: The visualized paranasal sinuses and mastoid air cells are clear. Visualized portions of the globes and intraorbital fat are unremarkable. Other: None. IMPRESSION: 1. No acute intracranial abnormality. 2. Atrophy with chronic small vessel white matter ischemic  disease. Electronically Signed   By: Kennith Center M.D.   On: 05/23/2017 18:08   US Abdomen Complete  Result Date: 05/05/2017 CLINICAL DATA:  Cirrhosis, hyperbilirubinemia, acute renal injury and jaundice. EXAM: ABDOMEN ULTRASOUND COMPLETE COMPARISON:  Right upper quadrant ultrasound from 05/03/2017, CT 06/15/2015 FINDINGS: Gallbladder: No gallstones or wall thickening visualized. No sonographic Murphy sign noted by sonographer. Gallbladder wall is normal at 3 mm. Small amount of ascites adjacent to the  gallbladder fundus. Biliary sludge noted previously is less apparent on current exam. Common bile duct: Diameter: 5.8 mm normal without choledocholithiasis. Liver: Coarse hepatic echotexture with nodular surface contours of the liver compatible with cirrhosis. Well-circumscribed 1.4 x 1.1 x 1.5 cm cyst is visualized and stable along the anterior aspect of the left hepatic lobe. IVC: No abnormality visualized. Pancreas: Limited visualization of the pancreas due to bowel gas. No focal abnormality is identified in the upper abdominal retroperitoneum. Spleen: Long thin appearing spleen measuring up to 14 cm in length, unchanged relative to prior CT from 2017. Right Kidney: Length: 11.1 cm. Echogenicity within normal limits. No mass or hydronephrosis visualized. Left Kidney: Length: 11.9 cm. Echogenicity within normal limits. No mass or hydronephrosis visualized. Abdominal aorta: The mid aorta is not aneurysmal measuring 1.8 cm. The upper and distal aorta are not well visualized. Other findings: Small volume of ascites noted outlining the liver and spleen. IMPRESSION: 1. Hepatic cirrhosis with stable left hepatic cyst measuring approximately 1.4 x 1.1 x 1.5 cm. 2. Small volume of ascites. 3. Unremarkable appearance of the gallbladder. Biliary sludge is less apparent on current exam. 4. Stable long, thin appearance of the spleen measuring up to 14 cm. Electronically Signed   By: Tollie Eth M.D.   On: 05/05/2017 19:31   US Paracentesis  Result Date: 05/25/2017 INDICATION: Alcoholic hepatitis, cirrhosis, recurrent ascites. Request made for diagnostic and therapeutic paracentesis up to 5 liters. EXAM: ULTRASOUND GUIDED DIAGNOSTIC AND THERAPEUTIC PARACENTESIS MEDICATIONS: None COMPLICATIONS: None immediate. PROCEDURE: Informed written consent was obtained from the patient after a discussion of the risks, benefits and alternatives to treatment. A timeout was performed prior to the initiation of the procedure. Initial  ultrasound scanning demonstrates a moderate to large amount of ascites within the left mid to lower abdominal quadrant. The left mid to lower abdomen was prepped and draped in the usual sterile fashion. 1% lidocaine was used for local anesthesia. Following this, a 19 gauge, 7-cm, Yueh catheter was introduced. An ultrasound image was saved for documentation purposes. The paracentesis was performed. The catheter was removed and a dressing was applied. The patient tolerated the procedure well without immediate post procedural complication. FINDINGS: A total of approximately 3.6 liters of golden yellow fluid was removed. Samples were sent to the laboratory as requested by the clinical team. Due to hypotension only the above amount of fluid was removed today. IMPRESSION: Successful ultrasound-guided diagnostic and therapeutic paracentesis yielding 3.6 liters of peritoneal fluid. Read by: Jeananne Rama, PA-C Electronically Signed   By: Jolaine Click M.D.   On: 05/25/2017 12:17   US Paracentesis  Result Date: 05/03/2017 INDICATION: History of cirrhosis, now with symptomatic ascites. Please perform ultrasound-guided paracentesis for infection source control purposes. EXAM: ULTRASOUND-GUIDED PARACENTESIS COMPARISON:  Right upper quadrant abdominal ultrasound - earlier same day MEDICATIONS: None. COMPLICATIONS: None immediate. TECHNIQUE: Informed written consent was obtained from the patient after a discussion of the risks, benefits and alternatives to treatment. A timeout was performed prior to the initiation of the procedure. Initial ultrasound scanning demonstrates  a small amount of ascites within the left lower abdominal quadrant. The left lower abdomen was prepped and draped in the usual sterile fashion. 1% lidocaine with epinephrine was used for local anesthesia. Under direct ultrasound guidance, a 19 gauge, 7-cm, Yueh catheter was introduced. An ultrasound image was saved for documentation purposed. The paracentesis  was performed. The catheter was removed and a dressing was applied. The patient tolerated the procedure well without immediate post procedural complication. FINDINGS: A total of approximately 650 cc of serous fluid was removed. Samples were sent to the laboratory as requested by the clinical team. IMPRESSION: Successful ultrasound-guided paracentesis yielding 650 cc of peritoneal fluid. Electronically Signed   By: Simonne Come M.D.   On: 05/03/2017 14:45   US Abdominal Pelvic Art/vent Flow Doppler  Result Date: 05/05/2017 CLINICAL DATA:  Cirrhosis.  Jaundice.  Inpatient. EXAM: DUPLEX ULTRASOUND OF LIVER TECHNIQUE: Color and duplex Doppler ultrasound was performed to evaluate the hepatic in-flow and out-flow vessels. COMPARISON:  06/15/2015 CT abdomen. FINDINGS: Portal Vein Velocities Main:  21 cm/sec Main portal vein diameter 1.1 cm. Patent main portal vein with appropriate flow direction. The right and left portal veins were unable to be visualized on this very limited scan limited by patient body habitus, bowel gas and difficulty breath holding. Hepatic Vein Velocities Left:  65 cm/sec The left hepatic vein is patent with appropriate flow direction. The middle and right hepatic veins could not be visualized. Hepatic Artery Velocity:  162 cm/sec Splenic Vein Velocity:  21 cm/sec Patent splenic vein with appropriate flow direction. Varices: Absent. Ascites: Small volume ascites. Spleen dimensions 10.7 x 4.4 x 12.2 cm (volume = 300 cm^3). Normal size spleen. IMPRESSION: Very limited scan, see comments. No acute hepatic vascular abnormality detected. CT abdomen/pelvis with IV contrast may be considered if there is continued clinical concern for acute hepatic vascular abnormality, given this scan's significant limitations. Small volume ascites. Electronically Signed   By: Delbert Phenix M.D.   On: 05/05/2017 20:06   US Abdomen Limited Ruq  Result Date: 05/03/2017 CLINICAL DATA:  Jaundice EXAM: ULTRASOUND ABDOMEN  LIMITED RIGHT UPPER QUADRANT COMPARISON:  CT abdomen/pelvis dated 06/15/2015. FINDINGS: Gallbladder: Layering gallbladder sludge. No gallbladder wall thickening or pericholecystic fluid. Negative sonographic Murphy's sign. Common bile duct: Diameter: 5 mm Liver: Coarse hepatic echotexture with nodular hepatic contour, suggesting cirrhosis. 2.3 x 2.2 x 2.0 cm hepatic cyst. Portal vein is patent on color Doppler imaging with normal direction of blood flow towards the liver. Additional comments: Ascites. IMPRESSION: Cirrhosis.  2.3 cm hepatic cyst, benign. Ascites. Layering gallbladder sludge. Electronically Signed   By: Charline Bills M.D.   On: 05/03/2017 14:03    Subjective: Without complaints this AM  Discharge Exam: Vitals:   05/28/17 0933 05/28/17 1252  BP: 108/65 (!) 97/56  Pulse: 71 64  Resp: 17 18  Temp:  (!) 97.5 F (36.4 C)  SpO2: 97% 97%   Vitals:   05/27/17 2026 05/28/17 0448 05/28/17 0933 05/28/17 1252  BP: 120/74 110/63 108/65 (!) 97/56  Pulse: 74 64 71 64  Resp: 20 18 17 18   Temp: 97.9 F (36.6 C) 97.6 F (36.4 C)  (!) 97.5 F (36.4 C)  TempSrc: Oral Oral  Oral  SpO2: 99% 99% 97% 97%  Weight:      Height:        General: Pt is alert, awake, not in acute distress Cardiovascular: RRR, S1/S2 +, no rubs, no gallops Respiratory: CTA bilaterally, no wheezing, no rhonchi Abdominal: Soft, NT,  ND, bowel sounds + Extremities: no edema, no cyanosis   The results of significant diagnostics from this hospitalization (including imaging, microbiology, ancillary and laboratory) are listed below for reference.     Microbiology: Recent Results (from the past 240 hour(s))  MRSA PCR Screening     Status: None   Collection Time: 05/24/17  1:42 AM  Result Value Ref Range Status   MRSA by PCR NEGATIVE NEGATIVE Final    Comment:        The GeneXpert MRSA Assay (FDA approved for NASAL specimens only), is one component of a comprehensive MRSA colonization surveillance  program. It is not intended to diagnose MRSA infection nor to guide or monitor treatment for MRSA infections. Performed at [facility], [address] [place], [place] [address]   Urine Culture     Status: Abnormal   Collection Time: 05/24/17  4:49 AM  Result Value Ref Range Status   Specimen Description   Final    URINE, RANDOM Performed at [facility], [address], [place], [place] [address]    Special Requests   Final    NONE Performed at [facility], [address] [place], [place] [address]    Culture MULTIPLE SPECIES PRESENT, SUGGEST RECOLLECTION (A)  Final   Report Status 05/25/2017 FINAL  Final  Body fluid culture     Status: None (Preliminary result)   Collection Time: 05/25/17 11:47 AM  Result Value Ref Range Status   Specimen Description   Final    PERITONEAL Performed at [facility], [address], [place], [place] [address]    Special Requests   Final    NONE Performed at [facility], [address], [place], [place] [address]    Gram Stain   Final    FEW WBC PRESENT, PREDOMINANTLY MONONUCLEAR NO ORGANISMS SEEN    Culture   Final    NO GROWTH 3 DAYS Performed at [facility], [address], [place], [place] [address]    Report Status PENDING  Incomplete     Labs: BNP (last 3 results) No results for input(s): BNP in the last 8760 hours. Basic Metabolic Panel: Recent Labs  Lab 05/24/17 0552 05/25/17 0528 05/26/17 0506 05/27/17 0920 05/28/17 0414  NA 126* 126* 126* 123* 124*  K 4.1 4.4 4.4 5.2* 5.8*  CL 95* 97* 98* 92* 96*  CO2 19* 19* 19* 19* 18*  GLUCOSE 93 107* 129* 152* 140*  BUN 59* 63* 66* 80* 99*  CREATININE 1.59* 1.92* 1.99* 2.43* 2.55*  CALCIUM 7.8* 7.8* 7.9* 8.3* 8.5*  MG 2.3  --   --   --   --   PHOS 4.4  --   --   --   --    Liver Function Tests: Recent Labs  Lab 05/24/17 0552 05/25/17 0528 05/26/17 0506  05/27/17 0920 05/28/17 0414  AST 225* 199* 181* 192* 172*  ALT 129* 130* 129* 147* 142*  ALKPHOS 187* 198* 213* 239* 221*  BILITOT 20.4* 20.0* 20.3* 21.5* 21.4*  PROT 5.1* 5.1* 5.2* 5.6* 5.4*  ALBUMIN 1.6* 1.6* 1.7* 1.8* 1.8*   No results for input(s): LIPASE, AMYLASE in the last 168 hours. Recent Labs  Lab 05/23/17 1838 05/25/17 0528  AMMONIA 39* 34   CBC: Recent Labs  Lab 05/23/17 1825 05/23/17 1838 05/24/17 0552 05/25/17 0528 05/26/17 0506  WBC  --  16.2* 14.1* 18.7* 18.7*  NEUTROABS  --   --   --  16.7*  --   HGB 15.0 13.2 12.3* 11.5* 12.0*  HCT 44.0 36.3* 34.3* 31.7* 33.0*  MCV  --  104.9* 105.9* 103.6* 103.4*  PLT  --  110* 98* 116* 112*   Cardiac Enzymes: No results for input(s): CKTOTAL, CKMB, CKMBINDEX, TROPONINI in the last 168 hours. BNP: Invalid input(s): POCBNP CBG: No results for input(s): GLUCAP in the last 168 hours. D-Dimer No results for input(s): DDIMER in the last 72 hours. Hgb A1c No results for input(s): HGBA1C in the last 72 hours. Lipid Profile No results for input(s): CHOL, HDL, LDLCALC, TRIG, CHOLHDL, LDLDIRECT in the last 72 hours. Thyroid function studies No results for input(s): TSH, T4TOTAL, T3FREE, THYROIDAB in the last 72 hours.  Invalid input(s): FREET3 Anemia work up No results for input(s): VITAMINB12, FOLATE, FERRITIN, TIBC, IRON, RETICCTPCT in the last 72 hours. Urinalysis    Component Value Date/Time   COLORURINE AMBER (A) 05/23/2017 2015   APPEARANCEUR HAZY (A) 05/23/2017 2015   LABSPEC 1.015 05/23/2017 2015   PHURINE 5.0 05/23/2017 2015   GLUCOSEU NEGATIVE 05/23/2017 2015   HGBUR SMALL (A) 05/23/2017 2015   BILIRUBINUR MODERATE (A) 05/23/2017 2015   KETONESUR NEGATIVE 05/23/2017 2015   PROTEINUR NEGATIVE 05/23/2017 2015   UROBILINOGEN >8.0 (H) 11/10/2014 1548   NITRITE NEGATIVE 05/23/2017 2015   LEUKOCYTESUR MODERATE (A) 05/23/2017 2015   Sepsis Labs Invalid input(s): PROCALCITONIN,  WBC,   LACTICIDVEN Microbiology Recent Results (from the past 240 hour(s))  MRSA PCR Screening     Status: None   Collection Time: 05/24/17  1:42 AM  Result Value Ref Range Status   MRSA by PCR NEGATIVE NEGATIVE Final    Comment:        The GeneXpert MRSA Assay (FDA approved for NASAL specimens only), is one component of a comprehensive MRSA colonization surveillance program. It is not intended to diagnose MRSA infection nor to guide or monitor treatment for MRSA infections. Performed at [facility], [address] [place], [place] [address]   Urine Culture     Status: Abnormal   Collection Time: 05/24/17  4:49 AM  Result Value Ref Range Status   Specimen Description   Final    URINE, RANDOM Performed at [facility], [address] [place], [place] [address]    Special Requests   Final    NONE Performed at [facility], [address], [place], [place] [address]    Culture MULTIPLE SPECIES PRESENT, SUGGEST RECOLLECTION (A)  Final   Report Status 05/25/2017 FINAL  Final  Body fluid culture     Status: None (Preliminary result)   Collection Time: 05/25/17 11:47 AM  Result Value Ref Range Status   Specimen Description   Final    PERITONEAL Performed at [facility], [address] [place], [place] [address]    Special Requests   Final    NONE Performed at [facility], [address], [place], [place] [address]    Gram Stain   Final    FEW WBC PRESENT, PREDOMINANTLY MONONUCLEAR NO ORGANISMS SEEN    Culture   Final    NO GROWTH 3 DAYS Performed at [facility], [address], [place], [place] [address]    Report Status PENDING  Incomplete     SIGNED:   Rickey Barbara, MD  [facility] 05/28/2017, 1:31 PM  If 7PM-7AM, please contact night-coverage [email or web address] Password [email or web address]

## 2017-05-28 NOTE — Progress Notes (Signed)
Assumed patient care at 15:55. Patient is currently asleep in bed. Patient is dressed and ready for discharge. No signs of distress. Per report, patient can be discharge once medical equipments are delivered to patient's home. Will continue to monitor patient.

## 2017-05-29 LAB — BODY FLUID CULTURE: Culture: NO GROWTH

## 2017-06-01 DEATH — deceased

## 2017-07-28 ENCOUNTER — Ambulatory Visit: Payer: Self-pay | Admitting: Gastroenterology
